# Patient Record
Sex: Female | Born: 1960 | Race: Black or African American | Hispanic: No | Marital: Married | Smoking: Never smoker
Health system: Southern US, Community
[De-identification: ages and names within clinical notes are randomized; demographics above are authoritative.]

## PROBLEM LIST (undated history)

## (undated) DIAGNOSIS — I2602 Saddle embolus of pulmonary artery with acute cor pulmonale: Secondary | ICD-10-CM

## (undated) DIAGNOSIS — K219 Gastro-esophageal reflux disease without esophagitis: Secondary | ICD-10-CM

## (undated) DIAGNOSIS — C569 Malignant neoplasm of unspecified ovary: Secondary | ICD-10-CM

## (undated) DIAGNOSIS — T451X5A Adverse effect of antineoplastic and immunosuppressive drugs, initial encounter: Secondary | ICD-10-CM

## (undated) DIAGNOSIS — I10 Essential (primary) hypertension: Secondary | ICD-10-CM

## (undated) DIAGNOSIS — D6481 Anemia due to antineoplastic chemotherapy: Secondary | ICD-10-CM

## (undated) HISTORY — PX: NASAL SINUS SURGERY: SHX719

## (undated) HISTORY — DX: Gastro-esophageal reflux disease without esophagitis: K21.9

## (undated) HISTORY — DX: Essential (primary) hypertension: I10

---

## 1998-01-27 HISTORY — PX: CHOLECYSTECTOMY: SHX55

## 1998-02-07 ENCOUNTER — Other Ambulatory Visit: Admission: RE | Admit: 1998-02-07 | Discharge: 1998-02-07 | Payer: Self-pay | Admitting: Obstetrics & Gynecology

## 1998-06-26 ENCOUNTER — Other Ambulatory Visit: Admission: RE | Admit: 1998-06-26 | Discharge: 1998-06-26 | Payer: Self-pay | Admitting: Obstetrics & Gynecology

## 1999-06-12 ENCOUNTER — Other Ambulatory Visit: Admission: RE | Admit: 1999-06-12 | Discharge: 1999-06-12 | Payer: Self-pay | Admitting: Obstetrics & Gynecology

## 2000-06-26 ENCOUNTER — Other Ambulatory Visit: Admission: RE | Admit: 2000-06-26 | Discharge: 2000-06-26 | Payer: Self-pay | Admitting: Obstetrics & Gynecology

## 2002-07-01 ENCOUNTER — Other Ambulatory Visit: Admission: RE | Admit: 2002-07-01 | Discharge: 2002-07-01 | Payer: Self-pay | Admitting: Obstetrics & Gynecology

## 2003-09-08 ENCOUNTER — Other Ambulatory Visit: Admission: RE | Admit: 2003-09-08 | Discharge: 2003-09-08 | Payer: Self-pay | Admitting: Obstetrics & Gynecology

## 2004-10-30 ENCOUNTER — Other Ambulatory Visit: Admission: RE | Admit: 2004-10-30 | Discharge: 2004-10-30 | Payer: Self-pay | Admitting: Obstetrics & Gynecology

## 2014-03-23 ENCOUNTER — Ambulatory Visit: Payer: 59 | Attending: Gynecologic Oncology | Admitting: Gynecologic Oncology

## 2014-03-23 ENCOUNTER — Encounter: Payer: Self-pay | Admitting: Gynecologic Oncology

## 2014-03-23 VITALS — BP 135/64 | HR 81 | Temp 98.5°F | Resp 20 | Ht 64.0 in | Wt 201.7 lb

## 2014-03-23 DIAGNOSIS — R971 Elevated cancer antigen 125 [CA 125]: Secondary | ICD-10-CM

## 2014-03-23 DIAGNOSIS — C569 Malignant neoplasm of unspecified ovary: Secondary | ICD-10-CM | POA: Insufficient documentation

## 2014-03-23 DIAGNOSIS — R1900 Intra-abdominal and pelvic swelling, mass and lump, unspecified site: Secondary | ICD-10-CM

## 2014-03-23 NOTE — Progress Notes (Signed)
Consult Note: Gyn-Onc  Audrey Porter 54 y.o. female  CC:  Chief Complaint  Patient presents with  . Ovarian Cancer    Left Pelvic mass    HPI: Patient is seen in consultation today at the request of Dr. Evie Lacks. Her primary care physician is Dr. Gar Ponto.  Patient is a 54 year old gravida 1 para 1 who thought that she was gaining weight for the past few months. She states that in January she had a urinary tract infection or so she thought. She started feeling some increase pressure and immediately after voiding felt like she still needed to void again and was having only small amounts of urine. She was seen by her primary care physician's office and she was diagnosed with a urinary tract infection and provided antibiotics; however, she was contacted that the urine culture was negative and her symptoms had not abated. She ultimately sought care for this. In retrospect she thinks that this is been developing an ongoing for several months.   She underwent a CT scan on 03/16/2014. It revealed the lung bases were clear. The liver enhances with no focal abnormality. There was perihepatic ascites present. The gallbladder was surgically absent. The pancreas, adrenal glands, spleen, stomach, kidneys were unremarkable. There was no evidence of any peritoneal nodularity or carcinomatosis. There is no lymphadenopathy. Within the pelvis there was a 9.9 x 20.1 x 17.8 large lobular mass which was septated and primarily cystic. However, the mass does contain some soft tissue component. The uterus could not be visualized. The urinary bladder was compressed by the large septated lobular mass.   Transvaginal ultrasound was performed on February 22. The uterus was 8.8 x 2.8 x 4.0 cm with no fibroids or other masses visualized. The endometrium was approximately 12 mm in thickness. The right ovary and left ovary were difficult to definitively leave visualize. Within the pelvis was a 20 x 19 x 9 cm solid and cystic  mass which filled the pelvic pelvis. There were large solid components with internal blood flow. There was a 1.5 cm bladder stone that was incidentally noted. CA-125 was performed that was 56.1. CEA was 0.6. All other labs were remarkable only for a creatinine of 1.44 and hematocrit of 32.7. Liver functions were normal. It is for this reason that she is referred to see [place] today.  Review of Systems: She denies any significant pain. She really only feels the pressure. She states that recently her husband commented that she doesn't seem to be eating as much so for the past several weeks she would endorse some early satiety but no nausea or vomiting. She has normal bowel and bladder habits. She's had no vaginal bleeding. She has any fevers, chills, nausea, vomiting. She denies any significant unintentional weight loss or weight gain. 10 point ROS is otherwise negative.  She is up-to-date on her mammograms having had one last year. She had a colonoscopy at age of 88 with benign polyps and follow-up in 5 years was recommended. Her last Pap smear was in 2014. It was normal she's never had an abnormal Pap smear.  Current Meds:  Outpatient Encounter Prescriptions as of 03/23/2014  Medication Sig  . fluticasone (FLONASE) 50 MCG/ACT nasal spray   . losartan-hydrochlorothiazide (HYZAAR) 100-12.5 MG per tablet Take 1 tablet by mouth daily. for high blood pressure  . nystatin-triamcinolone (MYCOLOG II) cream   . omeprazole (PRILOSEC) 20 MG capsule Take 20 mg by mouth 2 (two) times daily.  Marland Kitchen triamcinolone ointment (KENALOG) 0.1 %  Allergy:  Allergies  Allergen Reactions  . Sulfa Antibiotics Hives    Social Hx:  She has a daughter who is 97. 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 Marital Status: Married    Spouse Name: N/A  . Number of Children: N/A  . Years of Education: N/A   Occupational History  . Not on file.   Social History Main Topics  . Smoking status:  Never Smoker   . Smokeless tobacco: Not on file  . Alcohol Use: No  . Drug Use: No  . Sexual Activity: No   Other Topics Concern  . Not on file   Social History Narrative  . No narrative on file    Past Surgical Hx: History reviewed. No pertinent past surgical history.  Past Medical Hx: History reviewed. No pertinent past medical history.  Oncology Hx:   No history exists.    Family Hx: History reviewed. No pertinent family history. her brother died this past summer of lung cancer. He was a heavy smoker  Vitals:  Blood pressure 135/64, pulse 81, temperature 98.5 F (36.9 C), temperature source Oral, resp. rate 20, height 5\' 4"  (1.626 m), weight 201 lb 11.2 oz (91.491 kg).  Physical Exam: Well-nourished well-developed female in no acute distress.  Neck: Supple, no lymphadenopathy, no thyromegaly.  Lungs: Clear to auscultation bilaterally.  Cardiovascular: Regular rate and rhythm.  Abdomen: Obese with a large palpable mass that extends midway between the umbilicus to the xiphoid. There is no obvious fluid wave. There is no hepatosplenomegaly but exam is limited secondary to pelvic mass. She has mild tenderness. There is no rebound or guarding. The mass is not mobile and abdominal exam.  Groins: No lymphadenopathy.  Extremities: No edema.  Pelvic: Normal external female genitalia. The vagina is atraumatic and within normal limits. The cervix could not be seen. Bimanual examination the cervix is significantly deviated between the pubic symphysis. Palpably it is normal. There is a large mass that fills the abdomen and pelvis. There is no obvious nodularity. It is not mobile. Rectal confirms no nodularity.  Assessment/Plan:  54 year old with a large abdominal pelvic mass and elevated CA-125 that is concerning for an ovarian neoplasm. We discussed that definitive diagnosis can't be made until the time of surgery and she's willing to proceed. We discussed that we will need to  proceed with exploratory laparotomy and removal of the adnexal mass. She knows that it will be sent for frozen section. She would like to undergo a completion hysterectomy and removal of the contralateral adnexa. She understands if this is a malignancy she'll need to proceed with appropriate staging including lymph node dissection, biopsies, and omentectomy. [place] end of [place] therefore she'll undergo surgery at [facility] on March 4. She is a preoperative visit scheduled for February 29 at 9:00.  Her questions as well as those of her husband were elicited in answer to their satisfaction. If her daughter wishes to call me the patient does give permission for me to speak to her family members. Chiquitta Matty A., MD 03/23/2014, 1:03 PM

## 2014-03-23 NOTE — Patient Instructions (Signed)
Preop visit at [facility] on Monday, March 29 at 9:00. We will also coordinate a CT scan that same day. You will receive a call from Mrs. Wilson.  Surgery scheduled at [facility] on March 4.

## 2014-03-31 HISTORY — PX: ABDOMINAL HYSTERECTOMY: SHX81

## 2014-04-04 ENCOUNTER — Telehealth: Payer: Self-pay | Admitting: *Deleted

## 2014-04-04 NOTE — Telephone Encounter (Signed)
Pt called requesting appt for staple removal and f/u with Dr. Alycia Rossetti, pt c/o drive to [facility] is too far. Appt given for March 14 11am, and MD appt April 28 12pm,( previously 4/19 at [facility]) no further concerns,.

## 2014-04-10 ENCOUNTER — Ambulatory Visit: Payer: 59 | Attending: Gynecologic Oncology | Admitting: Gynecologic Oncology

## 2014-04-10 ENCOUNTER — Encounter: Payer: Self-pay | Admitting: Gynecologic Oncology

## 2014-04-10 VITALS — BP 124/66 | HR 80 | Temp 97.9°F | Resp 16 | Ht 64.0 in | Wt 195.5 lb

## 2014-04-10 DIAGNOSIS — Z79899 Other long term (current) drug therapy: Secondary | ICD-10-CM | POA: Insufficient documentation

## 2014-04-10 DIAGNOSIS — Z90710 Acquired absence of both cervix and uterus: Secondary | ICD-10-CM | POA: Diagnosis not present

## 2014-04-10 DIAGNOSIS — Z79891 Long term (current) use of opiate analgesic: Secondary | ICD-10-CM | POA: Diagnosis not present

## 2014-04-10 DIAGNOSIS — Z881 Allergy status to other antibiotic agents status: Secondary | ICD-10-CM | POA: Insufficient documentation

## 2014-04-10 DIAGNOSIS — R1900 Intra-abdominal and pelvic swelling, mass and lump, unspecified site: Secondary | ICD-10-CM

## 2014-04-10 DIAGNOSIS — I10 Essential (primary) hypertension: Secondary | ICD-10-CM | POA: Diagnosis not present

## 2014-04-10 DIAGNOSIS — K219 Gastro-esophageal reflux disease without esophagitis: Secondary | ICD-10-CM | POA: Insufficient documentation

## 2014-04-10 DIAGNOSIS — C569 Malignant neoplasm of unspecified ovary: Secondary | ICD-10-CM | POA: Insufficient documentation

## 2014-04-10 NOTE — Patient Instructions (Signed)
Plan to follow up on April 7.  You will receive a phone call about your final pathology.  Please call for any questions or concerns.

## 2014-04-11 ENCOUNTER — Encounter: Payer: Self-pay | Admitting: Gynecologic Oncology

## 2014-04-11 NOTE — Progress Notes (Signed)
Follow Up Note: Gyn-Onc  Audrey Porter 54 y.o. female  CC:  Chief Complaint  Patient presents with  . Routine Post Op    Follow up, staple removal    HPI:  Audrey Porter is a 54 year old, gravida 1 para 1, who noted weight gain over the past few months. In January, she developed symptoms of a urinary tract infection or so she thought. She started feeling some increase pressure and immediately after voiding felt like she still needed to void again and was having only small amounts of urine. She was seen by her primary care physician's office and she was diagnosed with a urinary tract infection and provided antibiotics; however, she was contacted that the urine culture was negative and her symptoms had not abated so she sought further evaluation.  She underwent a CT scan on 03/16/2014 that revealed the lung bases were clear. The liver enhances with no focal abnormality. There was perihepatic ascites present. The gallbladder was surgically absent. The pancreas, adrenal glands, spleen, stomach, kidneys were unremarkable. There was no evidence of any peritoneal nodularity or carcinomatosis. There is no lymphadenopathy. Within the pelvis there was a 9.9 x 20.1 x 17.8 large lobular mass which was septated and primarily cystic. However, the mass does contain some soft tissue component. The uterus could not be visualized. The urinary bladder was compressed by the large septated lobular mass.   Transvaginal ultrasound was performed on February 22. The uterus was 8.8 x 2.8 x 4.0 cm with no fibroids or other masses visualized. The endometrium was approximately 12 mm in thickness. The right ovary and left ovary were difficult to definitively leave visualize. Within the pelvis was a 20 x 19 x 9 cm solid and cystic mass which filled the pelvic pelvis. There were large solid components with internal blood flow. There was a 1.5 cm bladder stone that was incidentally noted. CA-125 was performed that was 56.1. CEA  was 0.6. All other labs were remarkable only for a creatinine of 1.44 and hematocrit of 32.7. Liver functions were normal.   On March 4, she underwent a total abdominal hysterectomy, bilateral salpingo-oophorectomy, bilateral pelvic and right peri-aortic lymphadenectomy, omentectomy, complete cytoreduction and staging of ovarian cancer. 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 Alycia Rossetti.  Final pathology pending at this time.  Operative findings included: Bilateral, complex and multicystic adnexal masses. The right ovarian mass was densely adherent to, but not invading through the sigmoid serosa. The right mass was also densely adherent to the posterior uterine wall. The posterior lower uterine segment was adherent to the rectosigmoid. There was no evidence of distant metastasis. There were reactive lymph nodes densely adherent to the external iliac vein on the right side. There was no omental or bowel involvement. 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 The cysts in the left ovary were drained in a controlled fashion. Some of the contents of the cyst in the right ovary spilled in the abdomen as it was being dissected off the rectosigmoid. 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     Interval History:  She presents with her husband for post-operative follow up and staple removal.  She reports doing well since surgery.  Diet tolerated and appetite approving since surgery.  Ambulating without difficulty with minimal incisional pain reported.  Denies nausea, emesis, or vaginal bleeding.  No issues voiced with her incision.  Taking milk of magnesia PRN and colace for her bowels.  Voiding  without difficulty.  Asking about her pathology and when she would need to start chemotherapy.  See more detailed review of systems below.  No other concerns voiced.    Review of Systems  Constitutional: Feels well.  No fever, chills,  early satiety, unintentional weight loss or gain.  Cardiovascular: No chest pain, shortness of breath, or edema.  Pulmonary: No cough or wheeze.  Gastrointestinal: No nausea, vomiting, or diarrhea. No bright red blood per rectum or change in bowel movement.  Genitourinary: No frequency, urgency, or dysuria. No vaginal bleeding or discharge.  Musculoskeletal: No myalgia or joint pain. Neurologic: No weakness, numbness, or change in gait.  Psychology: No depression, anxiety, or insomnia.  Current Meds:  Outpatient Encounter Prescriptions as of 04/10/2014  Medication Sig  . cetirizine (ZYRTEC) 5 MG tablet Take 5 mg by mouth daily.  Marland Kitchen docusate sodium (COLACE) 100 MG capsule Take 100 mg by mouth 2 (two) times daily.  Marland Kitchen enoxaparin (LOVENOX) 40 MG/0.4ML injection Inject 40 mg into the skin daily.   . fluticasone (FLONASE) 50 MCG/ACT nasal spray Place 1 spray into both nostrils daily.   Marland Kitchen losartan-hydrochlorothiazide (HYZAAR) 100-12.5 MG per tablet Take 1 tablet by mouth daily. for high blood pressure  . Multiple Vitamins-Minerals (CENTRUM SILVER ULTRA WOMENS PO) Take 1 tablet by mouth daily.  Marland Kitchen nystatin-triamcinolone (MYCOLOG II) cream Apply 1 application topically daily. Per pt, applies under both breasts after showering  . omeprazole (PRILOSEC) 20 MG capsule Take 20 mg by mouth 2 (two) times daily.  Marland Kitchen oxyCODONE (OXY IR/ROXICODONE) 5 MG immediate release tablet Take 5 mg by mouth every 4 (four) hours as needed.  . [DISCONTINUED] triamcinolone ointment (KENALOG) 0.1 %     Allergy:  Allergies  Allergen Reactions  . Sulfa Antibiotics Hives    Social Hx:   History   Social History  . Marital Status: Married    Spouse Name: N/A  . Number of Children: N/A  . Years of Education: N/A   Occupational History  . Not on file.   Social History Main Topics  . Smoking status: Never Smoker   . Smokeless tobacco: Not on file  . Alcohol Use: No  . Drug Use: No  . Sexual Activity: No   Other  Topics Concern  . Not on file   Social History Narrative    Past Surgical Hx:  Past Surgical History  Procedure Laterality Date  . Abdominal hysterectomy  03/31/14    at [facility]  . Cholecystectomy  2000  . Cesarean section  1990    Past Medical Hx:  Past Medical History  Diagnosis Date  . Hypertension   . Acid reflux     Family Hx: History reviewed. No pertinent family history.  Vitals:  Blood pressure 124/66, pulse 80, temperature 97.9 F (36.6 C), temperature source Oral, resp. rate 16, height 5\' 4"  (1.626 m), weight 195 lb 8 oz (88.678 kg).  Physical Exam:  General: Well developed, well nourished female in no acute distress. Alert and oriented x 3.  Cardiovascular: Regular rate and rhythm. S1 and S2 normal.  Lungs: Clear to auscultation bilaterally. No wheezes/crackles/rhonchi noted.  Skin: No rashes or lesions present. Back: No CVA tenderness.  Abdomen: Abdomen soft, non-tender and obese. Active bowel sounds in all quadrants. 26 staples removed from the midline incision.  1/2 inch steri strips applied.  No erythema, drainage, or evidence of wound separation present.  Extremities: No bilateral cyanosis, edema, or clubbing.   Assessment/Plan:  54 year old s/ptotal  abdominal hysterectomy, bilateral salpingo-oophorectomy, bilateral pelvic and right peri-aortic lymphadenectomy, omentectomy, complete cytoreduction and staging of ovarian cancer. 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 Alycia Rossetti.  Final pathology pending at this time.  She is doing well post-operatively.  She is informed that she would be contacted from Dr. Alycia Rossetti or [facility] with her pathology and recommendations.  Follow up is scheduled for April 7 in [place].  Incisional care reinforced along with reportable signs and symptoms.  Advised to call for any questions or concerns.     Audrey Crigler DEAL, NP 04/11/2014, 3:46 PM

## 2014-04-19 ENCOUNTER — Other Ambulatory Visit: Payer: Self-pay | Admitting: Gynecologic Oncology

## 2014-04-19 DIAGNOSIS — C569 Malignant neoplasm of unspecified ovary: Secondary | ICD-10-CM

## 2014-04-24 ENCOUNTER — Telehealth: Payer: Self-pay | Admitting: Oncology

## 2014-04-24 NOTE — Telephone Encounter (Signed)
Pt aware of appt's  Chemo ed-05/02/14@9 :38 Dr. Livesay-05/05/14@9 :30

## 2014-04-26 ENCOUNTER — Other Ambulatory Visit: Payer: Self-pay | Admitting: Oncology

## 2014-04-26 DIAGNOSIS — C561 Malignant neoplasm of right ovary: Secondary | ICD-10-CM | POA: Insufficient documentation

## 2014-04-26 DIAGNOSIS — C563 Malignant neoplasm of bilateral ovaries: Secondary | ICD-10-CM

## 2014-04-26 DIAGNOSIS — C562 Malignant neoplasm of left ovary: Principal | ICD-10-CM

## 2014-05-02 ENCOUNTER — Encounter: Payer: Self-pay | Admitting: *Deleted

## 2014-05-02 ENCOUNTER — Other Ambulatory Visit: Payer: 59

## 2014-05-04 ENCOUNTER — Ambulatory Visit: Payer: 59 | Attending: Gynecologic Oncology | Admitting: Gynecologic Oncology

## 2014-05-04 ENCOUNTER — Encounter: Payer: Self-pay | Admitting: Gynecologic Oncology

## 2014-05-04 VITALS — BP 119/68 | HR 70 | Temp 98.4°F | Resp 18 | Ht 64.0 in | Wt 192.5 lb

## 2014-05-04 DIAGNOSIS — I10 Essential (primary) hypertension: Secondary | ICD-10-CM | POA: Diagnosis not present

## 2014-05-04 DIAGNOSIS — Z9049 Acquired absence of other specified parts of digestive tract: Secondary | ICD-10-CM | POA: Diagnosis not present

## 2014-05-04 DIAGNOSIS — C561 Malignant neoplasm of right ovary: Secondary | ICD-10-CM | POA: Diagnosis not present

## 2014-05-04 DIAGNOSIS — C562 Malignant neoplasm of left ovary: Secondary | ICD-10-CM | POA: Insufficient documentation

## 2014-05-04 DIAGNOSIS — K219 Gastro-esophageal reflux disease without esophagitis: Secondary | ICD-10-CM | POA: Insufficient documentation

## 2014-05-04 DIAGNOSIS — R1900 Intra-abdominal and pelvic swelling, mass and lump, unspecified site: Secondary | ICD-10-CM

## 2014-05-04 DIAGNOSIS — Z90710 Acquired absence of both cervix and uterus: Secondary | ICD-10-CM | POA: Insufficient documentation

## 2014-05-04 DIAGNOSIS — C563 Malignant neoplasm of bilateral ovaries: Secondary | ICD-10-CM

## 2014-05-04 NOTE — Progress Notes (Signed)
Consult Note: Gyn-Onc  Audrey Porter 54 y.o. female  CC:  Chief Complaint  Patient presents with  . Routine Post Op    HPI: Patient is seen in consultation today at the request of Dr. Evie Lacks. Her primary care physician is Dr. Gar Ponto.  Patient is a 54 year old gravida 1 para 1 who thought that she was gaining weight for the past few months. She states that in January she had a urinary tract infection or so she thought. She started feeling some increase pressure and immediately after voiding felt like she still needed to void again and was having only small amounts of urine. She was seen by her primary care physician's office and she was diagnosed with a urinary tract infection and provided antibiotics; however, she was contacted that the urine culture was negative and her symptoms had not abated. She ultimately sought care for this. In retrospect she thinks that this is been developing an ongoing for several months.   She underwent a CT scan on 03/16/2014. It revealed the lung bases were clear. The liver enhances with no focal abnormality. There was perihepatic ascites present. The gallbladder was surgically absent. The pancreas, adrenal glands, spleen, stomach, kidneys were unremarkable. There was no evidence of any peritoneal nodularity or carcinomatosis. There is no lymphadenopathy. Within the pelvis there was a 9.9 x 20.1 x 17.8 large lobular mass which was septated and primarily cystic. However, the mass does contain some soft tissue component. The uterus could not be visualized. The urinary bladder was compressed by the large septated lobular mass.   Transvaginal ultrasound was performed on February 22. The uterus was 8.8 x 2.8 x 4.0 cm with no fibroids or other masses visualized. The endometrium was approximately 12 mm in thickness. The right ovary and left ovary were difficult to definitively leave visualize. Within the pelvis was a 20 x 19 x 9 cm solid and cystic mass which filled  the pelvic pelvis. There were large solid components with internal blood flow. There was a 1.5 cm bladder stone that was incidentally noted. CA-125 was performed that was 56.1. CEA was 0.6. All other labs were remarkable only for a creatinine of 1.44 and hematocrit of 32.7. Liver functions were normal. It is for this reason that she is referred to see [place] today.   Oncology Summary:    Ovarian cancer (RAF-HCC)    02/2014  Initial Diagnosis  Presented with weight gain, pelvic pressure, urinary frequency. CT: 20cm septate, cystic mass, ascites. CA-125 56.1.    03/31/2014  Surgery  TAH/BSO, BPLND, RPALND, omentectomy, ureterolysis. Findings: Bilat complex ovarian masses, dense adhesions, intraop spill of R cyst contents. R0 resection. Path: clear cell CA involving both ovaries, 1 R pelvic LN shows microscopic (6m) met.     Pathology: Diagnosis: FSA, A: Ovary and fallopian tube, left, left salpingo-oophorectomy  Histologic type: Clear cell carcinoma (see comment)   Histologic grade: high grade/grade 3   Tumor site: (ovary/primary peritoneal) ovary   Tumor size: (greatest dimension) 13 x 9.5 x 6.2 cm   Ovarian surface involvement: not identified   Status of capsule: (intact/ruptured) intact   Extent of involvement of other tissues/organs (select all that apply):   Right ovary  Left ovary  Lymphovascular space invasion: present   Regional lymph nodes (see other specimens):  Total number involved: 1  Total number examined: 10 Size of largest metastasis: microscopic focus only, approximately 1 mm;  necrosis, fibrosis, and foreign body type giant cell reaction present within two  additional lymph nodes, changes suggestive of treatment effect or regression   Additional pathologic findings: none   AJCC Pathologic Stage: pT3a1(i) pN1 pMx FIGO (2014classification) Stage Grouping: IIIA1(i)  Note: This pathologic stage assessment is based on information available at the time of this  report, and is subject to change pending clinical review and additional information.  B: Ovary and fallopian tube, right, right salpingo-oophorectomy  Histologic type: clear cell carcinoma (B8)  Histologic grade: high grade/grade 3   Tumor site: (ovary/primary peritoneal) ovary   Tumor size: (greatest dimension) 11 x 9 x 4.5 cm   Ovarian surface involvement: not identified   Status of capsule: (intact/ruptured) ruptured   C: Uterus and cervix, hysterectomy  Cervix  - No tumor seen   Endometrium  - Atrophic endometrium   Myometrium  - Adenomyosis and subserosal endometriosis   D: Omentum, omentectomy - No tumor seen   E: Lymph nodes, right periaortic, regional dissection - Three lymph nodes, no tumor seen (0/3)   F: Lymph nodes, right pelvic, regional dissection - 1 out of 6 lymph nodes shows minute (approximately 2 mm) focus of metastatic adenocarcinoma (1/6) - Two additional lymph nodes show necrosis, fibrosis, and foreign body type giant cell reaction suggestive of treatment effect versus regression   G: Lymph node, left pelvic, regional dissection - One lymph node, no tumor seen (0/1)   COMMENT: Multiple immunostains are performed on block A12 to further evaluate the case for metastasis to the bilateral ovaries form a distant site and to evaluate for mucinous versus clear cell change. The tumor shows focal intracytoplasmic mucin on mucin stains, and immunostains shows the tumor to be cytokeratin 7 positive, cytokeratin 20 negative, CDX2 negative, PAX8 positive, and negative for chromogranin, synaptophysin, TTF1, p53, ER, and PR, and rare positive cells on CA125, with low p53 (approximately 5%). Napsin is positive. RCC and CD10 are negative. PAS is positive and the positive staining does not digest with PASD. These findings supporting a tumor of primary gynecologic origin. Overall, a clear cell carcinoma is favored. Seromucinous features cannot be excluded  based on the presence of scattered, faint intracytoplasmic mucin noted on mucin stain and the morphology with focal signet ring cells. Although uncommon, this has been described, however, in clear cell carcinomas, therefore the above classification is favored.     She comes in today for her postoperative visit. She's overall doing quite well. She offers no complaints. She denies any vaginal bleeding or discharge. She has minimal pain. She has normal bowel and bladder function.  Current Meds:  Outpatient Encounter Prescriptions as of 05/04/2014  Medication Sig  . cetirizine (ZYRTEC) 5 MG tablet Take 5 mg by mouth daily.  . fluticasone (FLONASE) 50 MCG/ACT nasal spray Place 1 spray into both nostrils daily.   Marland Kitchen losartan-hydrochlorothiazide (HYZAAR) 100-12.5 MG per tablet Take 1 tablet by mouth daily. for high blood pressure  . Multiple Vitamins-Minerals (CENTRUM SILVER ULTRA WOMENS PO) Take 1 tablet by mouth daily.  Marland Kitchen nystatin-triamcinolone (MYCOLOG II) cream Apply 1 application topically daily. Per pt, applies under both breasts after showering  . omeprazole (PRILOSEC) 20 MG capsule Take 20 mg by mouth 2 (two) times daily.  Marland Kitchen oxyCODONE (OXY IR/ROXICODONE) 5 MG immediate release tablet Take 5 mg by mouth every 4 (four) hours as needed.  . docusate sodium (COLACE) 100 MG capsule Take 100 mg by mouth 2 (two) times daily.  . [DISCONTINUED] enoxaparin (LOVENOX) 40 MG/0.4ML injection Inject 40 mg into the skin daily.  Allergy:  Allergies  Allergen Reactions  . Sulfa Antibiotics Hives    Social Hx:  She has a daughter who is 44. 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 Marital Status: Married    Spouse Name: N/A  . Number of Children: N/A  . Years of Education: N/A   Occupational History  . Not on file.   Social History Main Topics  . Smoking status: Never Smoker   . Smokeless tobacco: Not on file  . Alcohol Use: No  . Drug Use: No  . Sexual  Activity: No   Other Topics Concern  . Not on file   Social History Narrative    Past Surgical Hx:  Past Surgical History  Procedure Laterality Date  . Abdominal hysterectomy  03/31/14    at [facility]  . Cholecystectomy  2000  . Cesarean section  1990    Past Medical Hx:  Past Medical History  Diagnosis Date  . Hypertension   . Acid reflux     Oncology Hx:    Ovarian cancer, bilateral   03/23/2014 Initial Diagnosis Pelvic mass   03/31/2014 Surgery IIIA1(i) clear cell carcinoma of the ovary    Family Hx: History reviewed. No pertinent family history. her brother died this past summer of lung cancer. He was a heavy smoker  Vitals:  Blood pressure 119/68, pulse 70, temperature 98.4 F (36.9 C), temperature source Oral, resp. rate 18, height '5\' 4"'  (1.626 m), weight 192 lb 8 oz (87.317 kg).  Physical Exam: Well-nourished well-developed female in no acute distress.  Abdomen: Well-healed vertical midline incision. Abdomen is soft and nontender. There is no appreciable hernias.  Pelvic: Normal female genitalia. The vaginal cuff is well-healed. Bimanual examination reveals postoperative changes but no masses or nodularity.  Stage/Disposition: Audrey Porter is a 54 y.o. 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 Disposition to platinum and taxane chemotherapy and referral to genetic counseling. A referral has in place for genetic counseling. The patient is scheduled to see Dr. Marko Plume tomorrow for discussion regarding chemotherapy. After she gets her first cycle of chemotherapy, she'll call the office to see when she feels like she'll be able to return to work. She is overdue on her mammogram and we placed an order for her to have one performed in [place] which is closer to home. We've also place an appointment for her to be seen by the genetics counselors.  She was provided a copy of her pathology report and we reviewed in detail. Her questions were elicited in  answer to her satisfaction. Koi Yarbro A., MD 05/04/2014, 1:50 PM

## 2014-05-05 ENCOUNTER — Ambulatory Visit: Payer: 59

## 2014-05-05 ENCOUNTER — Telehealth: Payer: Self-pay | Admitting: Oncology

## 2014-05-05 ENCOUNTER — Other Ambulatory Visit (HOSPITAL_COMMUNITY): Payer: Self-pay | Admitting: Unknown Physician Specialty

## 2014-05-05 ENCOUNTER — Encounter: Payer: Self-pay | Admitting: Oncology

## 2014-05-05 ENCOUNTER — Other Ambulatory Visit: Payer: Self-pay | Admitting: Gynecologic Oncology

## 2014-05-05 ENCOUNTER — Other Ambulatory Visit (HOSPITAL_BASED_OUTPATIENT_CLINIC_OR_DEPARTMENT_OTHER): Payer: 59

## 2014-05-05 ENCOUNTER — Telehealth: Payer: Self-pay | Admitting: Genetic Counselor

## 2014-05-05 ENCOUNTER — Ambulatory Visit (HOSPITAL_BASED_OUTPATIENT_CLINIC_OR_DEPARTMENT_OTHER): Payer: 59 | Admitting: Oncology

## 2014-05-05 VITALS — BP 132/62 | HR 70 | Temp 97.7°F | Resp 18 | Ht 64.0 in | Wt 192.1 lb

## 2014-05-05 DIAGNOSIS — J3089 Other allergic rhinitis: Secondary | ICD-10-CM

## 2014-05-05 DIAGNOSIS — C563 Malignant neoplasm of bilateral ovaries: Secondary | ICD-10-CM

## 2014-05-05 DIAGNOSIS — C561 Malignant neoplasm of right ovary: Secondary | ICD-10-CM

## 2014-05-05 DIAGNOSIS — Z1231 Encounter for screening mammogram for malignant neoplasm of breast: Secondary | ICD-10-CM

## 2014-05-05 DIAGNOSIS — Z9049 Acquired absence of other specified parts of digestive tract: Secondary | ICD-10-CM

## 2014-05-05 DIAGNOSIS — N135 Crossing vessel and stricture of ureter without hydronephrosis: Secondary | ICD-10-CM

## 2014-05-05 DIAGNOSIS — C562 Malignant neoplasm of left ovary: Secondary | ICD-10-CM | POA: Diagnosis not present

## 2014-05-05 DIAGNOSIS — K219 Gastro-esophageal reflux disease without esophagitis: Secondary | ICD-10-CM

## 2014-05-05 DIAGNOSIS — I10 Essential (primary) hypertension: Secondary | ICD-10-CM

## 2014-05-05 LAB — COMPREHENSIVE METABOLIC PANEL (CC13)
ALT: 16 U/L (ref 0–55)
ANION GAP: 12 meq/L — AB (ref 3–11)
AST: 13 U/L (ref 5–34)
Albumin: 4 g/dL (ref 3.5–5.0)
Alkaline Phosphatase: 56 U/L (ref 40–150)
BILIRUBIN TOTAL: 0.53 mg/dL (ref 0.20–1.20)
BUN: 16.8 mg/dL (ref 7.0–26.0)
CO2: 23 mEq/L (ref 22–29)
CREATININE: 1.4 mg/dL — AB (ref 0.6–1.1)
Calcium: 9.5 mg/dL (ref 8.4–10.4)
Chloride: 105 mEq/L (ref 98–109)
EGFR: 51 mL/min/{1.73_m2} — AB (ref >90)
GLUCOSE: 94 mg/dL (ref 70–140)
Potassium: 3.7 mEq/L (ref 3.5–5.1)
Sodium: 140 mEq/L (ref 136–145)
Total Protein: 7.3 g/dL (ref 6.4–8.3)

## 2014-05-05 LAB — CBC WITH DIFFERENTIAL/PLATELET
BASO%: 1.3 % (ref 0.0–2.0)
Basophils Absolute: 0.1 10*3/uL (ref 0.0–0.1)
EOS ABS: 0.4 10*3/uL (ref 0.0–0.5)
EOS%: 6.9 % (ref 0.0–7.0)
HCT: 33 % — ABNORMAL LOW (ref 34.8–46.6)
HGB: 10.8 g/dL — ABNORMAL LOW (ref 11.6–15.9)
LYMPH#: 2.2 10*3/uL (ref 0.9–3.3)
LYMPH%: 38.6 % (ref 14.0–49.7)
MCH: 27 pg (ref 25.1–34.0)
MCHC: 32.5 g/dL (ref 31.5–36.0)
MCV: 82.9 fL (ref 79.5–101.0)
MONO#: 0.5 10*3/uL (ref 0.1–0.9)
MONO%: 8 % (ref 0.0–14.0)
NEUT%: 45.2 % (ref 38.4–76.8)
NEUTROS ABS: 2.6 10*3/uL (ref 1.5–6.5)
Platelets: 299 10*3/uL (ref 145–400)
RBC: 3.99 10*6/uL (ref 3.70–5.45)
RDW: 15.6 % — AB (ref 11.2–14.5)
WBC: 5.7 10*3/uL (ref 3.9–10.3)

## 2014-05-05 MED ORDER — ONDANSETRON HCL 8 MG PO TABS: 8.0000 mg | ORAL_TABLET | Freq: Three times a day (TID) | ORAL | Status: DC | PRN

## 2014-05-05 MED ORDER — DEXAMETHASONE 4 MG PO TABS: ORAL_TABLET | ORAL | Status: DC

## 2014-05-05 MED ORDER — LORAZEPAM 0.5 MG PO TABS
ORAL_TABLET | ORAL | Status: DC
Start: 2014-05-05 — End: 2015-01-08

## 2014-05-05 NOTE — Progress Notes (Signed)
[place] NEW PATIENT EVALUATION   Name: Audrey Porter Date: May 05, 2014  MRN: [id number] DOB: 1960-08-19  REFERRING PHYSICIAN: Nancy Marus cc Caryl Bis, MD (PCP), Alden Hipp), [facility])   REASON FOR REFERRAL: clear cell ovarian carcinoma, for adjuvant chemotherapy  Information from outside records from [facility] reviewed in detail by this MD, direct communication from Dr Alycia Rossetti, this EMR and history from patient.   HISTORY OF PRESENT ILLNESS:Audrey Porter is a 54 y.o. female who is seen in consultation, together with husband, at the request of  Dr Alycia Rossetti, with recently diagnosed  clear cell carcinoma of bilateral ovaries, IIIA1(i)  Patient had been in usual good health, up to date on all medical care, when she developed bladder symptoms in early 2016. Urine culture showed no infection, then CT AP at [facility] 03-16-14 found perihepatic ascites and pelvic mass 9.9 x 20.1 x 17.8 cm which was cystic and solid, kidneys and ureters unremarkable, no pelvic fluid, rectosigmoid partially compressed, other organs not remarkable, no mention of adenopathy (report scanned into this EMR). Past gyn care has been by Dr Alden Hipp, however she was seen promptly by Dr Evie Lacks on referral from Dr Quillian Quince after CT resulted; CT findings confirmed by transvaginal US. CA 125 was 56 pre op. She saw Dr Alycia Rossetti in consultation on 03-23-14, then had surgery at [facility] on 03-31-14 which was TAH/BSO, omentectomy, pelvic and paraaortic node evaluation. Surgical findings were of bilateral complex ovarian masses and dense adhesions, with intra-operative spill of right ovarian mass as that was densely adherent to sigmoid serosa; posterior uterine segment also adherent to rectosigmoid, and retroperitoneal fibrosis bilaterally. Pathology [facility] [phone]) found grade 3 clear cell carcinoma involving both ovaries, largest 13 x 9.5 x 6.2 cm, + LVSI and one right pelvic node with microscopic  involvement approximately 1 mm, ER PR negative, pT3a1 pN1 pMx. Cytology from ascites [facility] [phone]) was positive for adenocarcinoma of gyn primary.  Multidisciplinary conference at [facility] 04-19-14 recommended carboplatin and taxane chemotherapy. She saw Dr Alycia Rossetti on 05-04-14, recovering well post operatively. She attended chemotherapy education class prior to this visit.    No PAC Genetics counseling requested by Dr Alycia Rossetti    REVIEW OF SYSTEMS as above, also: Constipation initially after surgery, used colace, now bowels moving regularly with soft stool. No dysuria or other symptoms of bladder infection. No significant hot flashes. Appetite at baseline, healthy diet tho does not eat large portions. Weight at usual. No fever. No bleeding, no symptoms of clots; not on lovenox. No significant pain now from surgery. No HA, no peripheral neuropathy. Wears glasses. Environmental allergies improved with zyrtec and flonase. No problems with hearing. Up to date on dental care every 6 months. No thyroid problems. No lower respiratory symptoms. No changes in breasts. 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 Peripheral venous access thought adequate to begin chemotherapy. Remainder of full 10 point review of systems negative.   ALLERGIES: Sulfa antibiotics causes rash  PAST MEDICAL/ SURGICAL HISTORY:    All previous PAPs fine, last prior 2014 G1P1 with C section 1990 HTN x 10 years Laparoscopy with fulguration of endometriosis by Dr Alden Hipp. Menses stopped after procedure stayed on BCP until ~ age 14, no significant hot flashes Cholecystectomy 2000 Dr Anthony Sar in [place] previously at Dr Lanette Hampshire office, due ~ now, ordered at [facility] by Dr Alycia Rossetti GERD improved with prilosec daily Hgb 11 preop, no history of anemia known  CURRENT MEDICATIONS: reviewed as  listed now in EMR. Prescriptions for decadron 20 mg with food 12 hrs and 6 hrs prior to taxol, prn zofran and prn ativan. Written and oral  instructions given for these medications  PHARMACY [facility]  SOCIAL HISTORY: from [place] where she lives with husband. 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 Husband works in [place]:   HTN Brother died of lung ca, was heavy smoker No other cancer Daughter healthy         PHYSICAL EXAM:  height is '5\' 4"'  (1.626 m) and weight is 192 lb 1.6 oz (87.136 kg). Her oral temperature is 97.7 F (36.5 C). Her blood pressure is 132/62 and her pulse is 70. Her respiration is 18 and oxygen saturation is 100%.  Alert, pleasant, cooperative lady looks stated age, easily mobile, excellent historian, very articulate, looks comfortable. Husband very supportive. HEENT:normal hair pattern. PERRL, not icteric. Oral mucosa moist and clear, good dentition. Neck supple without JVD or thyroid mass  RESPIRATORY:respirations not labored RA. Lungs clear to A and P  CARDIAC/ VASCULAR: Heart RRR without murmur or gallop. Peripheral pulses intact and symmetrical.   ABDOMEN: soft, not tender, not distended. Midline incision healing well, without tenderness, erythema or drainage. No HSM or mass. Normally active BS  LYMPH NODES:no cervical, supraclavicular, axillary or inguinal adenopathy  BREASTS:bilaterally without dominant mass, skin or nipple findings  NEUROLOGIC: CN, motor, sensory, cerebellar intact. Fully alert and oriented. PSYCH appropriate mood and affect  SKIN: without rash, ecchymosis, petechiae  MUSCULOSKELETAL: good and symmetrical muscle mass, back nontender, LE without swelling, cords, tenderness   LABORATORY DATA:  Results for orders placed or performed in visit on 05/05/14 (from the past 48  hour(s))  CBC with Differential     Status: Abnormal   Collection Time: 05/05/14  9:09 AM  Result Value Ref Range   WBC 5.7 3.9 - 10.3 10e3/uL   NEUT# 2.6 1.5 - 6.5 10e3/uL   HGB 10.8 (L) 11.6 - 15.9 g/dL   HCT 33.0 (L) 34.8 - 46.6 %   Platelets 299 145 - 400 10e3/uL   MCV 82.9 79.5 - 101.0 fL   MCH 27.0 25.1 - 34.0 pg   MCHC 32.5 31.5 - 36.0 g/dL   RBC 3.99 3.70 - 5.45 10e6/uL   RDW 15.6 (H) 11.2 - 14.5 %   lymph# 2.2 0.9 - 3.3 10e3/uL   MONO# 0.5 0.1 - 0.9 10e3/uL   Eosinophils Absolute 0.4 0.0 - 0.5 10e3/uL   Basophils Absolute 0.1 0.0 - 0.1 10e3/uL   NEUT% 45.2 38.4 - 76.8 %   LYMPH% 38.6 14.0 - 49.7 %   MONO% 8.0 0.0 - 14.0 %   EOS% 6.9 0.0 - 7.0 %   BASO% 1.3 0.0 - 2.0 %  Comprehensive metabolic panel (Cmet) - [facility]     Status: Abnormal   Collection Time: 05/05/14  9:09 AM  Result Value Ref Range   Sodium 140 136 - 145 mEq/L   Potassium 3.7 3.5 - 5.1 mEq/L   Chloride 105 98 - 109 mEq/L   CO2 23 22 - 29 mEq/L   Glucose 94 70 - 140 mg/dl   BUN 16.8 7.0 - 26.0 mg/dL   Creatinine 1.4 (H) 0.6 - 1.1 mg/dL   Total  Bilirubin 0.53 0.20 - 1.20 mg/dL   Alkaline Phosphatase 56 40 - 150 U/L   AST 13 5 - 34 U/L   ALT 16 0 - 55 U/L   Total Protein 7.3 6.4 - 8.3 g/dL   Albumin 4.0 3.5 - 5.0 g/dL   Calcium 9.5 8.4 - 10.4 mg/dL   Anion Gap 12 (H) 3 - 11 mEq/L   EGFR 51 (L) >90 ml/min/1.73 m2    Comment: eGFR is calculated using the CKD-EPI Creatinine Equation (2009)      PATHOLOGY:  Result Report  Surgical pathology exam3/04/2014  [place]       Result Narrative  Patient:     BOLUWATIFE, MUTCHLER MRN:         [id number] DOB (Age):   01-15-1961 (Age: 30) Collected:   03/31/2014 Received:    03/31/2014 Completed:   04/14/2014    Accession #:   [id number]  Diagnosis:  FSA, A:  Ovary and fallopian tube, left, left salpingo-oophorectomy  Histologic type:     Clear cell carcinoma (see comment)        Histologic grade:     high grade/grade 3   Tumor site:  (ovary/primary peritoneal)     ovary   Tumor size: (greatest dimension)     13 x 9.5 x 6.2 cm   Ovarian surface involvement:     not identified   Status of capsule: (intact/ruptured)     intact   Extent of involvement of other tissues/organs (select all that apply):                 Right ovary            Left ovary  Lymphovascular space invasion:     present   Regional lymph nodes (see other specimens):           Total number involved:     1           Total number examined:     10      Size of largest metastasis:     microscopic focus only, approximately 1 mm;       necrosis, fibrosis, and foreign body type giant cell reaction present within two additional lymph nodes, changes suggestive of treatment effect or regression        Additional pathologic findings:     none   AJCC Pathologic Stage:                pT3a1(i) pN1 pMx FIGO (2014classification) Stage Grouping:     IIIA1(i)  Note:  This pathologic stage assessment is based on information available at the time of this report, and is subject to change pending clinical review and additional information.  B:  Ovary and fallopian tube, right, right salpingo-oophorectomy  Histologic type:     clear cell carcinoma (B8)  Histologic grade:     high grade/grade 3   Tumor site: (ovary/primary peritoneal)     ovary   Tumor size: (greatest dimension)     11 x 9 x 4.5 cm   Ovarian surface involvement:     not identified   Status of capsule: (intact/ruptured)     ruptured   C:  Uterus and cervix, hysterectomy  Cervix  - No tumor seen   Endometrium  - Atrophic endometrium   Myometrium  - Adenomyosis and subserosal endometriosis   D:  Omentum, omentectomy - No tumor seen   E:  Lymph nodes, right periaortic, regional dissection -  Three lymph nodes, no tumor seen (0/3)   F:  Lymph nodes, right pelvic, regional dissection - 1 out of 6 lymph nodes shows minute (approximately 2 mm) focus of metastatic adenocarcinoma  (1/6) - Two additional lymph nodes show necrosis, fibrosis, and foreign body type giant cell reaction suggestive of treatment effect versus regression   G:  Lymph node, left pelvic, regional dissection - One lymph node, no tumor seen (0/1)   COMMENT: Multiple immunostains are performed on block A12 to further evaluate the case for metastasis to the bilateral ovaries form a distant site and to evaluate for mucinous versus clear cell change. The tumor shows focal intracytoplasmic mucin on mucin stains, and immunostains shows the tumor to be cytokeratin 7 positive, cytokeratin 20 negative, CDX2 negative, PAX8 positive, and negative for chromogranin, synaptophysin, TTF1, p53, ER, and PR, and rare positive cells on CA125, with low p53 (approximately 5%). Napsin is positive. RCC and CD10 are negative. PAS is positive and the positive staining does not digest with PASD. These findings supporting a tumor of primary gynecologic origin. Overall, a clear cell carcinoma is favored. Seromucinous features cannot be excluded based on the presence of scattered, faint intracytoplasmic mucin noted on mucin stain and the morphology with focal signet ring cells. Although uncommon, this has been described, however, in clear cell carcinomas, therefore the above classification is favored       CYTOLOGY [place] - Pelvic   Result Narrative  Patient:     DENIKA, KRONE MRN:         [id number] DOB (Age):   September 06, 1960 (Age: 59) Collected:   03/31/2014 Received:    04/03/2014 Completed:   04/04/2014   MIM#[id number]     [facility] 03-31-2014 Non-GYN Cytopathology Report Accession #:   [id number]  DIAGNOSIS: A.  Ascitic fluid:   - Positive for malignancy - Adenocarcinoma of gynecologic/primary peritoneal origin (See comment)       RADIOGRAPHY: See report of CT AP from [facility] 03-16-14, scanned into this EMR  CXR at [facility] 03-27-14 not remarkable, that report in [place].  DISCUSSION: Circumstances  surrounding diagnosis, procedures to date and surgical findings reviewed. Rationale for adjuvant chemotherapy for clear cell ovarian cancer discussed. She and husband are comfortable with information received from chemotherapy education class. Reviewed q 3 week schedule for taxol and carboplatin, which she prefers to weekly dose dense. Discussed peripheral venous administration vs PAC. Discussed possible side effects of chemotherapy including nausea, drop in counts, hair loss around time of second treatment, taxol aches, premedication steroids to lessen chance of reaction to taxol, antiemetics, possible peripheral neuropathy from taxol. We have discussed follow up visits between chemotherapy treatments, including follow up blood counts and chemistries. We have discussed CA 125 marker. They understand that they can call at any time if questions or concerns. Husband does not work on Fridays, so that day best for treatment. She is presently on leave from work, may need this extended or at least intermittent FMLA during chemotherapy; note work involves visits to group homes.    IMPRESSION / PLAN:  1. High grade clear cell carcinoma of bilateral ovaries with microscopic involvement of one right pelvic node at complete debulking surgery by Dr Alycia Rossetti at [facility] 03-31-2014, FIGO IIIA1 (i). Plan to begin chemotherapy with every 3 week carboplatin and taxol x 6 cycles starting 05-12-14. I will see her with labs ~4-22 and 05-25-14. Genetics counseling 2.Retroperitoneal fibrosis and dense adhesions at surgery 3.HTN well controlled 4.due mammograms upcoming, which are to be  scheduled at [facility] 5.previous fulguration of endometriosis, post C section 6.GERD controlled with prilosec 7.rash to sulfa 8.environmental allergies on zyrtec and flonase 9.Advance Directive information given   Patient and husband have had questions answered to their satisfaction and are in agreement with plan above. They can contact this office  for questions or concerns at any time prior to next scheduled visit. Verbal consent for chemo obtained. Chemo orders entered. Message to financial staff re preauthorization for chemo and possible granix. Message to RN to follow up by phone re premedication steroids shortly prior to first treatment. Time spent 55 min , including >50% discussion and coordination of care.  Cc this note Drs Jana Half, MD 05/05/2014 11:32 AM

## 2014-05-05 NOTE — Telephone Encounter (Signed)
appointments made and avs printed for patient °

## 2014-05-05 NOTE — Progress Notes (Signed)
Checked in new pt with no financial concerns at this time.  Pt has my card for any billing questions or concerns. °

## 2014-05-05 NOTE — Patient Instructions (Signed)
We will send prescriptions to your pharmacy   1.decadron (dexamethasone, steroid) 4 mg. Take five tablets +(=20 mg) with food 12 hrs before taxol chemotherapy and five tablets with food 6 hrs before taxol. Chemo will be at 9:15 AM on 05-12-14, so you will take decadron at ~ 9 PM on Thurs 4-14 and again ~ 3 AM on Fri 4-15.  2.zofran (ondansetron) 8mg  One tablet every 8 hrs as needed for nausea. Will not make you drowsy. Fine to take one tablet AM after chemo whether or not any nausea then, to extend coverage for nausea a bit longer. Other than that dose, fine to take just as needed for nausea   3.ativan (lorazepam) 0.5 mg. One tablet swallow or dissolve under tongue every 6 hrs as needed for nausea. WIll make you drowsy and a little forgetful around each dose. Fine to take one tablet at bedtime night of chemo whether or not any nausea.   You can call any time if needed [phone]

## 2014-05-05 NOTE — Telephone Encounter (Signed)
GENETIC APPT-S/W PATIENT AND GAVE GENETIC APPT FOR 04/14 @ 12:30 W/GENETIC COUNSELOR REFERRING DR. Nancy Marus

## 2014-05-07 ENCOUNTER — Other Ambulatory Visit: Payer: Self-pay | Admitting: Oncology

## 2014-05-07 DIAGNOSIS — N135 Crossing vessel and stricture of ureter without hydronephrosis: Secondary | ICD-10-CM | POA: Insufficient documentation

## 2014-05-07 DIAGNOSIS — Z9049 Acquired absence of other specified parts of digestive tract: Secondary | ICD-10-CM | POA: Insufficient documentation

## 2014-05-07 DIAGNOSIS — I10 Essential (primary) hypertension: Secondary | ICD-10-CM | POA: Insufficient documentation

## 2014-05-07 DIAGNOSIS — K219 Gastro-esophageal reflux disease without esophagitis: Secondary | ICD-10-CM | POA: Insufficient documentation

## 2014-05-07 DIAGNOSIS — Z9109 Other allergy status, other than to drugs and biological substances: Secondary | ICD-10-CM | POA: Insufficient documentation

## 2014-05-08 ENCOUNTER — Telehealth: Payer: Self-pay | Admitting: Oncology

## 2014-05-08 NOTE — Telephone Encounter (Signed)
Appointments made for 4/22 and patient will get a new schedule at her 4/18 appointments

## 2014-05-08 NOTE — Telephone Encounter (Signed)
Patient confirmed labs canceled for 04/15 due to having labs the with Genetics.

## 2014-05-09 ENCOUNTER — Ambulatory Visit: Payer: Self-pay

## 2014-05-09 ENCOUNTER — Telehealth: Payer: Self-pay

## 2014-05-09 NOTE — Telephone Encounter (Signed)
Spoke with Ms. Ericsson about  Labs for her treatment  being drawn on 05-10-13 instead of 05-12-14 as noted below by Dr. Marko Plume. Reviewed the times to take the decadron prior to 05-12-14 treatment. 2100 on 05-11-14 and 0300 on 05-12-14 with some food.  Told her to take an ativan tab the night of chemo and a zofran tablet in teh am after treatment whether or not she was nauseous to keep medication in her system.  She then uses the antiemetics prn.  Reviewed how the Taxol aches can become very severe 2-3 days after her treatment.  She can use a heating pad, take warm baths , calritin 10 mg daily for 5 days beginning 05-12-14.  Sdvil or tylenol prn. She does not have any stronger pain medication on hand.  She can call ithe office if the pain is not controled with the interventions mentioned.  Patient verbalized understanding.

## 2014-05-09 NOTE — Telephone Encounter (Signed)
-----   Message from Gordy Levan, MD sent at 05/07/2014 10:48 AM EDT ----- #2 message on this lady  I see she is now scheduled for genetics on 4-14 with their labs that day. I sent urgent POF to move chemo labs from 4-15 day of Rx to draw them with genetics labs on 4-14.  Could you please explain this to patient and whoever else asks about it Thanks

## 2014-05-09 NOTE — Telephone Encounter (Signed)
Patient Demographics     Patient Name Sex DOB SSN Address Phone    Audrey Porter, Audrey Porter Female 06-22-1960 [id number] [place] [address] [phone] [facility]) [phone] (Work) [phone] (Mobile)      Message  Received: 4 days ago    Gordy Levan, MD  Baruch Merl, RN; Christa See, RN           RN please speak with her by phone shortly before first long taxol carbo on 05-12-14, to review steroids, antiemetics, taxol aches.    Please send to pharmacy [facility] always fine   1.zofran 8 mg: 1 q 8 hr prn nausea. Will not make drowsy #30 1RF   2.ativan 0.5 mg: 1 SL or po q 6 hr prn nausea. Will make drowsy #20 NRF   3.decadron 4 mg: Five tabs with food (=20mg ) 12 hrs prior to chemo and 5 tabs with  food (=20 mg) 6 hrs prior to chemo  #10 for first cycle   thanks

## 2014-05-10 ENCOUNTER — Ambulatory Visit: Payer: Self-pay

## 2014-05-11 ENCOUNTER — Ambulatory Visit (HOSPITAL_BASED_OUTPATIENT_CLINIC_OR_DEPARTMENT_OTHER): Payer: 59 | Admitting: Genetic Counselor

## 2014-05-11 ENCOUNTER — Other Ambulatory Visit (HOSPITAL_BASED_OUTPATIENT_CLINIC_OR_DEPARTMENT_OTHER): Payer: 59

## 2014-05-11 ENCOUNTER — Ambulatory Visit: Payer: Self-pay

## 2014-05-11 DIAGNOSIS — C561 Malignant neoplasm of right ovary: Secondary | ICD-10-CM

## 2014-05-11 DIAGNOSIS — Z803 Family history of malignant neoplasm of breast: Secondary | ICD-10-CM

## 2014-05-11 DIAGNOSIS — Z800 Family history of malignant neoplasm of digestive organs: Secondary | ICD-10-CM

## 2014-05-11 DIAGNOSIS — C562 Malignant neoplasm of left ovary: Principal | ICD-10-CM

## 2014-05-11 DIAGNOSIS — C563 Malignant neoplasm of bilateral ovaries: Secondary | ICD-10-CM

## 2014-05-11 LAB — CBC WITH DIFFERENTIAL/PLATELET
BASO%: 1.2 % (ref 0.0–2.0)
BASOS ABS: 0.1 10*3/uL (ref 0.0–0.1)
EOS%: 5.1 % (ref 0.0–7.0)
Eosinophils Absolute: 0.3 10*3/uL (ref 0.0–0.5)
HEMATOCRIT: 32.5 % — AB (ref 34.8–46.6)
HGB: 10.5 g/dL — ABNORMAL LOW (ref 11.6–15.9)
LYMPH#: 2.2 10*3/uL (ref 0.9–3.3)
LYMPH%: 35 % (ref 14.0–49.7)
MCH: 26.5 pg (ref 25.1–34.0)
MCHC: 32.3 g/dL (ref 31.5–36.0)
MCV: 82 fL (ref 79.5–101.0)
MONO#: 0.5 10*3/uL (ref 0.1–0.9)
MONO%: 7.2 % (ref 0.0–14.0)
NEUT%: 51.5 % (ref 38.4–76.8)
NEUTROS ABS: 3.2 10*3/uL (ref 1.5–6.5)
Platelets: 376 10*3/uL (ref 145–400)
RBC: 3.96 10*6/uL (ref 3.70–5.45)
RDW: 15.5 % — AB (ref 11.2–14.5)
WBC: 6.3 10*3/uL (ref 3.9–10.3)

## 2014-05-11 LAB — COMPREHENSIVE METABOLIC PANEL (CC13)
ALBUMIN: 4 g/dL (ref 3.5–5.0)
ALT: 14 U/L (ref 0–55)
ANION GAP: 9 meq/L (ref 3–11)
AST: 13 U/L (ref 5–34)
Alkaline Phosphatase: 59 U/L (ref 40–150)
BUN: 17 mg/dL (ref 7.0–26.0)
CALCIUM: 9.4 mg/dL (ref 8.4–10.4)
CO2: 26 meq/L (ref 22–29)
Chloride: 104 mEq/L (ref 98–109)
Creatinine: 1.3 mg/dL — ABNORMAL HIGH (ref 0.6–1.1)
EGFR: 54 mL/min/{1.73_m2} — ABNORMAL LOW (ref >90)
Glucose: 91 mg/dl (ref 70–140)
POTASSIUM: 4.3 meq/L (ref 3.5–5.1)
SODIUM: 140 meq/L (ref 136–145)
TOTAL PROTEIN: 7.1 g/dL (ref 6.4–8.3)
Total Bilirubin: 0.48 mg/dL (ref 0.20–1.20)

## 2014-05-12 ENCOUNTER — Ambulatory Visit (HOSPITAL_BASED_OUTPATIENT_CLINIC_OR_DEPARTMENT_OTHER): Payer: 59

## 2014-05-12 ENCOUNTER — Other Ambulatory Visit: Payer: Self-pay

## 2014-05-12 ENCOUNTER — Other Ambulatory Visit: Payer: Self-pay | Admitting: Oncology

## 2014-05-12 ENCOUNTER — Encounter: Payer: Self-pay | Admitting: Nurse Practitioner

## 2014-05-12 ENCOUNTER — Ambulatory Visit (HOSPITAL_BASED_OUTPATIENT_CLINIC_OR_DEPARTMENT_OTHER): Payer: 59 | Admitting: Nurse Practitioner

## 2014-05-12 ENCOUNTER — Encounter: Payer: Self-pay | Admitting: Genetic Counselor

## 2014-05-12 VITALS — BP 115/68 | HR 71 | Temp 98.1°F | Resp 18

## 2014-05-12 DIAGNOSIS — C561 Malignant neoplasm of right ovary: Secondary | ICD-10-CM | POA: Diagnosis not present

## 2014-05-12 DIAGNOSIS — C563 Malignant neoplasm of bilateral ovaries: Secondary | ICD-10-CM

## 2014-05-12 DIAGNOSIS — C562 Malignant neoplasm of left ovary: Secondary | ICD-10-CM

## 2014-05-12 DIAGNOSIS — Z803 Family history of malignant neoplasm of breast: Secondary | ICD-10-CM | POA: Insufficient documentation

## 2014-05-12 DIAGNOSIS — Z5111 Encounter for antineoplastic chemotherapy: Secondary | ICD-10-CM

## 2014-05-12 DIAGNOSIS — T7840XA Allergy, unspecified, initial encounter: Secondary | ICD-10-CM | POA: Insufficient documentation

## 2014-05-12 DIAGNOSIS — Z800 Family history of malignant neoplasm of digestive organs: Secondary | ICD-10-CM | POA: Insufficient documentation

## 2014-05-12 LAB — CA 125: CA 125: 8 U/mL (ref <35)

## 2014-05-12 MED ORDER — SODIUM CHLORIDE 0.9 % IV SOLN
Freq: Once | INTRAVENOUS | Status: AC
  Administered 2014-05-12: 10:00:00 via INTRAVENOUS

## 2014-05-12 MED ORDER — SODIUM CHLORIDE 0.9 % IV SOLN
Freq: Once | INTRAVENOUS | Status: AC | PRN
  Administered 2014-05-12: 11:00:00 via INTRAVENOUS

## 2014-05-12 MED ORDER — METHYLPREDNISOLONE SODIUM SUCC 125 MG IJ SOLR
125.0000 mg | Freq: Once | INTRAMUSCULAR | Status: AC | PRN
  Administered 2014-05-12: 125 mg via INTRAVENOUS

## 2014-05-12 MED ORDER — FAMOTIDINE IN NACL 20-0.9 MG/50ML-% IV SOLN
INTRAVENOUS | Status: AC
  Filled 2014-05-12: qty 50

## 2014-05-12 MED ORDER — FAMOTIDINE IN NACL 20-0.9 MG/50ML-% IV SOLN
20.0000 mg | Freq: Once | INTRAVENOUS | Status: AC
  Administered 2014-05-12: 20 mg via INTRAVENOUS

## 2014-05-12 MED ORDER — SODIUM CHLORIDE 0.9 % IV SOLN
Freq: Once | INTRAVENOUS | Status: AC
  Administered 2014-05-12: 10:00:00 via INTRAVENOUS
  Filled 2014-05-12: qty 8

## 2014-05-12 MED ORDER — DIPHENHYDRAMINE HCL 50 MG/ML IJ SOLN
25.0000 mg | Freq: Once | INTRAMUSCULAR | Status: AC | PRN
  Administered 2014-05-12: 25 mg via INTRAVENOUS

## 2014-05-12 MED ORDER — DIPHENHYDRAMINE HCL 50 MG/ML IJ SOLN
50.0000 mg | Freq: Once | INTRAMUSCULAR | Status: AC
  Administered 2014-05-12: 50 mg via INTRAVENOUS

## 2014-05-12 MED ORDER — DIPHENHYDRAMINE HCL 50 MG/ML IJ SOLN
INTRAMUSCULAR | Status: AC
  Filled 2014-05-12: qty 1

## 2014-05-12 MED ORDER — SODIUM CHLORIDE 0.9 % IV SOLN
558.0000 mg | Freq: Once | INTRAVENOUS | Status: AC
  Administered 2014-05-12: 560 mg via INTRAVENOUS
  Filled 2014-05-12: qty 56

## 2014-05-12 MED ORDER — DEXTROSE 5 % IV SOLN
175.0000 mg/m2 | Freq: Once | INTRAVENOUS | Status: AC
  Administered 2014-05-12: 348 mg via INTRAVENOUS
  Filled 2014-05-12: qty 58

## 2014-05-12 NOTE — Progress Notes (Signed)
SYMPTOM MANAGEMENT CLINIC   HPI: Audrey Porter 54 y.o. female diagnosed with ovarian cancer.  Patient is status post hysterectomy.  Here today to initiate her first cycle of carboplatin/Taxol chemotherapy regimen.  Patient recently diagnosed with ovarian cancer.  She is status post hysterectomy obtained on 03/31/2014.  Patient presented to the [facility] today to receive her first cycle of carboplatin/Taxol chemotherapy regimen.  Patient developed some mild chest heaviness for a brief amount of time at the onset of the Taxol infusion.  Patient denied any actual chest pain, shortness of breath, or pain with inspiration.  Confirmed patient was premedicated with Zofran 16 mg, dexamethasone 20 mg, Benadryl 50 mg, and Pepcid 20 mg.  Taxol infusion was immediately held; and patient was given Benadryl and Solu-Medrol 125 mg IV.  All symptoms did eventually resolve completely; and patient was able to complete her chemotherapy as previously directed.  All vital signs remained stable throughout.  HPI  ROS  Past Medical History  Diagnosis Date  . Hypertension   . Acid reflux     Past Surgical History  Procedure Laterality Date  . Abdominal hysterectomy  03/31/14    at [facility]  . Cholecystectomy  2000  . Cesarean section  1990    has Ovarian cancer, bilateral; Environmental and seasonal allergies; Gastroesophageal reflux disease without esophagitis; HTN (hypertension), benign; Retroperitoneal fibrosis; Status post cholecystectomy; Family history of malignant neoplasm of breast; Family history of malignant neoplasm of gastrointestinal tract; and Hypersensitivity reaction on her problem list.    is allergic to sulfa antibiotics.    Medication List       This list is accurate as of: 05/12/14  4:52 PM.  Always use your most recent med list.               CENTRUM SILVER ULTRA WOMENS PO  Take 1 tablet by mouth daily.     cetirizine 5 MG tablet  Commonly known as:  ZYRTEC  Take 5  mg by mouth daily.     dexamethasone 4 MG tablet  Commonly known as:  DECADRON  Take 5 tabs with food (=20 mg) 12 hours nad 6 hrs prior to Taxol Chemotherapy     docusate sodium 100 MG capsule  Commonly known as:  COLACE  Take 100 mg by mouth 2 (two) times daily.     fluticasone 50 MCG/ACT nasal spray  Commonly known as:  FLONASE  Place 1 spray into both nostrils daily.     LORazepam 0.5 MG tablet  Commonly known as:  ATIVAN  Place 1 tablet under the tongue or swallow every 6 hours as needed for nausea or vomiting.  Will make drowsy.     losartan-hydrochlorothiazide 100-12.5 MG per tablet  Commonly known as:  HYZAAR  Take 1 tablet by mouth daily. for high blood pressure     nystatin-triamcinolone cream  Commonly known as:  MYCOLOG II  Apply 1 application topically daily. Per pt, applies under both breasts after showering     omeprazole 20 MG capsule  Commonly known as:  PRILOSEC  Take 20 mg by mouth 2 (two) times daily.     ondansetron 8 MG tablet  Commonly known as:  ZOFRAN  Take 1 tablet (8 mg total) by mouth every 8 (eight) hours as needed for nausea or vomiting. Will not make drowsy     oxyCODONE 5 MG immediate release tablet  Commonly known as:  Oxy IR/ROXICODONE  Take 5 mg by mouth every 4 (four)  hours as needed.         PHYSICAL EXAMINATION  Oncology Vitals 05/12/2014 05/12/2014 05/12/2014 05/12/2014 05/12/2014 05/12/2014 05/12/2014  Height - - - - - - -  Weight - - - - - - -  Weight (lbs) - - - - - - -  BMI (kg/m2) - - - - - - -  Temp 98.1 98.8 97.8 97.7 97.5 97.5 97.5  Pulse 71 63 65 69 70 73 76  Resp '18 16 16 16 16 18 18  ' SpO2 - 100 100 100 100 100 100  BSA (m2) - - - - - - -   BP Readings from Last 3 Encounters:  05/12/14 115/68  05/05/14 132/62  05/04/14 119/68    Physical Exam  Constitutional: She is oriented to person, place, and time and well-developed, well-nourished, and in no distress.  HENT:  Head: Normocephalic and atraumatic.    Mouth/Throat: Oropharynx is clear and moist.  Eyes: Conjunctivae and EOM are normal. Pupils are equal, round, and reactive to light. Right eye exhibits no discharge. Left eye exhibits no discharge. No scleral icterus.  Neck: Normal range of motion. Neck supple. No JVD present. No tracheal deviation present. No thyromegaly present.  Cardiovascular: Normal rate, regular rhythm, normal heart sounds and intact distal pulses.   Pulmonary/Chest: Effort normal and breath sounds normal. No stridor. No respiratory distress. She has no wheezes. She has no rales. She exhibits no tenderness.  Abdominal: Soft. Bowel sounds are normal. She exhibits no distension and no mass. There is no tenderness. There is no rebound and no guarding.  Musculoskeletal: Normal range of motion. She exhibits no edema or tenderness.  Lymphadenopathy:    She has no cervical adenopathy.  Neurological: She is alert and oriented to person, place, and time. Gait normal.  Skin: Skin is warm and dry. No rash noted. No erythema. No pallor.  Psychiatric: Affect normal.  Nursing note and vitals reviewed.   LABORATORY DATA:. Appointment on 05/11/2014  Component Date Value Ref Range Status  . WBC 05/11/2014 6.3  3.9 - 10.3 10e3/uL Final  . NEUT# 05/11/2014 3.2  1.5 - 6.5 10e3/uL Final  . HGB 05/11/2014 10.5* 11.6 - 15.9 g/dL Final  . HCT 05/11/2014 32.5* 34.8 - 46.6 % Final  . Platelets 05/11/2014 376  145 - 400 10e3/uL Final  . MCV 05/11/2014 82.0  79.5 - 101.0 fL Final  . MCH 05/11/2014 26.5  25.1 - 34.0 pg Final  . MCHC 05/11/2014 32.3  31.5 - 36.0 g/dL Final  . RBC 05/11/2014 3.96  3.70 - 5.45 10e6/uL Final  . RDW 05/11/2014 15.5* 11.2 - 14.5 % Final  . lymph# 05/11/2014 2.2  0.9 - 3.3 10e3/uL Final  . MONO# 05/11/2014 0.5  0.1 - 0.9 10e3/uL Final  . Eosinophils Absolute 05/11/2014 0.3  0.0 - 0.5 10e3/uL Final  . Basophils Absolute 05/11/2014 0.1  0.0 - 0.1 10e3/uL Final  . NEUT% 05/11/2014 51.5  38.4 - 76.8 % Final  .  LYMPH% 05/11/2014 35.0  14.0 - 49.7 % Final  . MONO% 05/11/2014 7.2  0.0 - 14.0 % Final  . EOS% 05/11/2014 5.1  0.0 - 7.0 % Final  . BASO% 05/11/2014 1.2  0.0 - 2.0 % Final  . Sodium 05/11/2014 140  136 - 145 mEq/L Final  . Potassium 05/11/2014 4.3  3.5 - 5.1 mEq/L Final  . Chloride 05/11/2014 104  98 - 109 mEq/L Final  . CO2 05/11/2014 26  22 - 29 mEq/L Final  .  Glucose 05/11/2014 91  70 - 140 mg/dl Final  . BUN 05/11/2014 17.0  7.0 - 26.0 mg/dL Final  . Creatinine 05/11/2014 1.3* 0.6 - 1.1 mg/dL Final  . Total Bilirubin 05/11/2014 0.48  0.20 - 1.20 mg/dL Final  . Alkaline Phosphatase 05/11/2014 59  40 - 150 U/L Final  . AST 05/11/2014 13  5 - 34 U/L Final  . ALT 05/11/2014 14  0 - 55 U/L Final  . Total Protein 05/11/2014 7.1  6.4 - 8.3 g/dL Final  . Albumin 05/11/2014 4.0  3.5 - 5.0 g/dL Final  . Calcium 05/11/2014 9.4  8.4 - 10.4 mg/dL Final  . Anion Gap 05/11/2014 9  3 - 11 mEq/L Final  . EGFR 05/11/2014 54* >90 ml/min/1.73 m2 Final   eGFR is calculated using the CKD-EPI Creatinine Equation (2009)  . CA 125 05/11/2014 8  <35 U/mL Final   Comment:  This test was performed using the [place] chemiluminescentmethod.  Values obtained from different assay methods cannot be usedinterchangeably.  CA125 levels , regardless of value, should not beinterpreted as absolute evidence of the presence or  absence ofdisease.      RADIOGRAPHIC STUDIES: No results found.  ASSESSMENT/PLAN:    Ovarian cancer, bilateral Patient recently diagnosed with ovarian cancer.  She is status post hysterectomy obtained on 03/31/2014.  Patient presented to the [facility] today to receive her first cycle of carboplatin/Taxol chemotherapy regimen.  Patient developed some mild chest heaviness for a brief amount of time at the onset of the Taxol infusion.  Patient denied any actual chest pain, shortness of breath, or pain with inspiration.  Confirmed patient was premedicated with Zofran 16 mg,  dexamethasone 20 mg, Benadryl 50 mg, and Pepcid 20 mg.  Taxol infusion was immediately held; and patient was given Benadryl and Solu-Medrol 125 mg IV.  All symptoms did eventually resolve completely; and patient was able to complete her chemotherapy as previously directed.  All vital signs remained stable throughout.  Patient has plans to return on 05/19/2014 for labs and a follow-up visit.  Her next chemotherapy will be due around 06/02/2014.   Hypersensitivity reaction Patient recently diagnosed with ovarian cancer.  She is status post hysterectomy obtained on 03/31/2014.  Patient presented to the [facility] today to receive her first cycle of carboplatin/Taxol chemotherapy regimen.  Patient developed some mild chest heaviness for a brief amount of time at the onset of the Taxol infusion.  Patient denied any actual chest pain, shortness of breath, or pain with inspiration.  Confirmed patient was premedicated with Zofran 16 mg, dexamethasone 20 mg, Benadryl 50 mg, and Pepcid 20 mg.  Taxol infusion was immediately held; and patient was given Benadryl and Solu-Medrol 125 mg IV.  All symptoms did eventually resolve completely; and patient was able to complete her chemotherapy as previously directed.  All vital signs remained stable throughout.    Patient stated understanding of all instructions; and was in agreement with this plan of care. The patient knows to call the clinic with any problems, questions or concerns.   Review/collaboration with Dr. Marko Plume regarding all aspects of patient's visit today.   Total time spent with patient was 40 minutes;  with greater than 75 percent of that time spent in face to face counseling regarding patient's symptoms,  and coordination of care and follow up.  Disclaimer: This note was dictated with voice recognition software. Similar sounding words can inadvertently be transcribed and may not be corrected upon review.   Berniece Salines,  Caren Griffins, NP 05/12/2014

## 2014-05-12 NOTE — Assessment & Plan Note (Signed)
Patient recently diagnosed with ovarian cancer.  She is status post hysterectomy obtained on 03/31/2014.  Patient presented to the [facility] today to receive her first cycle of carboplatin/Taxol chemotherapy regimen.  Patient developed some mild chest heaviness for a brief amount of time at the onset of the Taxol infusion.  Patient denied any actual chest pain, shortness of breath, or pain with inspiration.  Confirmed patient was premedicated with Zofran 16 mg, dexamethasone 20 mg, Benadryl 50 mg, and Pepcid 20 mg.  Taxol infusion was immediately held; and patient was given Benadryl and Solu-Medrol 125 mg IV.  All symptoms did eventually resolve completely; and patient was able to complete her chemotherapy as previously directed.  All vital signs remained stable throughout.

## 2014-05-12 NOTE — Progress Notes (Signed)
[facility] Patient Visit  REFERRING PROVIDER: Caryl Bis, MD [place], [place] [address]  PRIMARY PROVIDER:  Gar Ponto, MD  PRIMARY REASON FOR VISIT:  1. Ovarian cancer, bilateral   2. Family history of malignant neoplasm of breast   3. Family history of malignant neoplasm of gastrointestinal tract     HISTORY OF PRESENT ILLNESS:   Audrey Porter, a 54 y.o. female, was seen for a [facility] cancer genetics consultation at the request of Dr. Quillian Quince due to a personal and family history of cancer.  Audrey Porter presents to clinic today to discuss the possibility of a hereditary predisposition to cancer, genetic testing, and to further clarify her future cancer risks, as well as potential cancer risks for family members.   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    Past Medical History  Diagnosis Date   Hypertension    Acid reflux     Past Surgical History  Procedure Laterality Date   Abdominal hysterectomy  03/31/14    at [facility]   Cholecystectomy  2000   Cesarean section  1990    History   Social History   Marital Status: Married    Spouse Name: N/A   Number of Children: N/A   Years of Education: N/A   Social History Main Topics   Smoking status: Never Smoker    Smokeless tobacco: Not on file   Alcohol Use: No   Drug Use: No   Sexual Activity: No   Other Topics Concern   Not on file   Social History Narrative     FAMILY HISTORY:  During the visit, a 4-generation pedigree was obtained. A copy of the pedigree with be scanned into Epic under the Media tab. Significant family history diagnoses include the following: Family History  Problem Relation Age of Onset   Cancer Brother 52    lung smoker   Cancer Paternal Aunt 20    breast   Cancer Maternal Grandmother 25    leukemia    Cancer Paternal Grandmother     colorectal at unknown age   Audrey Porter ancestry is of African American descent. There is no known Jewish ancestry or consanguinity.  GENETIC COUNSELING ASSESSMENT:  Audrey Porter is a 54 y.o. female with a personal and family history of cancer suggestive of a hereditary predisposition to cancer. We, therefore, discussed and recommended the following at today's visit.   DISCUSSION:  We reviewed the characteristics, features and inheritance patterns of hereditary cancer syndromes. We also discussed genetic testing, including the appropriate family members to test, the process of testing, insurance coverage and turn-around-time for results. We discussed the implications of a negative, positive and/or variant of uncertain significant result. We recommended Audrey Porter pursue genetic testing for the OvaNext gene panel. The OvaNext gene panel offered by [employer] includes sequencing and rearrangement analysis for the following 24 genes:ATM, BARD1, BRCA1, BRCA2, BRIP1, CDH1, CHEK2, EPCAM, MLH1, MRE11A, MSH2, MSH6, MUTYH, NBN, NF1, PALB2, PMS2, PTEN, RAD50, RAD51C, RAD51D, SMARCA4, STK11, and TP53.  PLAN:  Based on our above recommendation, Audrey Porter wished to pursue genetic testing and the blood sample was drawn and will be sent to [facility] for analysis. Results should be available within approximately 4 weeks time, at which point they will be disclosed by telephone to Audrey Porter, as  will any additional recommendations warranted by these results. Lastly, we encouraged Audrey Porter to remain in contact with cancer genetics annually so that we can continuously update the family history and inform her of any changes in cancer genetics and testing that may be of benefit for this family.   Ms.  Dekay questions were answered to her satisfaction today. Our contact information was provided should additional questions or concerns arise. Thank you for the  referral and allowing [place] to share in the care of your patient.   Catherine A. Fine, MS, CGC Certified Psychologist, [email or web address] phone: [phone]  The patient was seen for a total of 45 minutes in face-to-face genetic counseling.  This patient was discussed with Dr. Marko Plume who agrees with the above.    ______________________________________________________________________ For Office Staff:  Number of people involved in session including genetic counselor: 2 Was an intern or student involved with case: not applicable

## 2014-05-12 NOTE — Assessment & Plan Note (Addendum)
Patient recently diagnosed with ovarian cancer.  She is status post hysterectomy obtained on 03/31/2014.  Patient presented to the [facility] today to receive her first cycle of carboplatin/Taxol chemotherapy regimen.  Patient developed some mild chest heaviness for a brief amount of time at the onset of the Taxol infusion.  Patient denied any actual chest pain, shortness of breath, or pain with inspiration.  Confirmed patient was premedicated with Zofran 16 mg, dexamethasone 20 mg, Benadryl 50 mg, and Pepcid 20 mg.  Taxol infusion was immediately held; and patient was given Benadryl and Solu-Medrol 125 mg IV.  All symptoms did eventually resolve completely; and patient was able to complete her chemotherapy as previously directed.  All vital signs remained stable throughout.  Patient has plans to return on 05/19/2014 for labs and a follow-up visit.  Her next chemotherapy will be due around 06/02/2014.

## 2014-05-12 NOTE — Patient Instructions (Signed)
[facility] Discharge Instructions for Patients Receiving Chemotherapy  Today you received the following chemotherapy agents Taxol /Carboplatin.  To help prevent nausea and vomiting after your treatment, we encourage you to take your nausea medication as needed   If you develop nausea and vomiting that is not controlled by your nausea medication, call the clinic.   BELOW ARE SYMPTOMS THAT SHOULD BE REPORTED IMMEDIATELY:  *FEVER GREATER THAN 100.5 F  *CHILLS WITH OR WITHOUT FEVER  NAUSEA AND VOMITING THAT IS NOT CONTROLLED WITH YOUR NAUSEA MEDICATION  *UNUSUAL SHORTNESS OF BREATH  *UNUSUAL BRUISING OR BLEEDING  TENDERNESS IN MOUTH AND THROAT WITH OR WITHOUT PRESENCE OF ULCERS  *URINARY PROBLEMS  *BOWEL PROBLEMS  UNUSUAL RASH Items with * indicate a potential emergency and should be followed up as soon as possible.  Feel free to call the clinic you have any questions or concerns. The clinic phone number is [phone].  Please show the CHEMO ALERT CARD at check-in to the Emergency Department and triage nurse.   

## 2014-05-12 NOTE — Progress Notes (Signed)
1040 taxol started. 1041 pt states she is having trouble getting her breath, heavy chest. Taxol stopped, NS infusing, 02 sat 92%, Benadryl 25mg  IV administered, 02 2 liters applied.  1042 O2 sat 95% 1044 Solumedrol 125 mg IV given, pt states she is feeling better, VSS 02 sat 100% 1125 Taxol restarted. Pt tolerating well. Rate increased per protocol.

## 2014-05-15 ENCOUNTER — Telehealth: Payer: Self-pay

## 2014-05-15 NOTE — Telephone Encounter (Signed)
Audrey Porter stated that she is doing well. She is feeling tired.  Experienced some legs aches and took  some ibuprofen with good effect. She is taking Claritin for aches.  She also uses zyrtec.  Told her not to use the zyrtec while taking the Claritin. She is eating and drinking fluids well. Bowels are moving well She knows to call [phone] if she has any concerns or issues prior to her appointment 05-19-14.

## 2014-05-17 ENCOUNTER — Telehealth: Payer: Self-pay | Admitting: Oncology

## 2014-05-17 ENCOUNTER — Other Ambulatory Visit: Payer: Self-pay | Admitting: Oncology

## 2014-05-17 DIAGNOSIS — C563 Malignant neoplasm of bilateral ovaries: Secondary | ICD-10-CM

## 2014-05-17 DIAGNOSIS — C561 Malignant neoplasm of right ovary: Secondary | ICD-10-CM

## 2014-05-17 DIAGNOSIS — C562 Malignant neoplasm of left ovary: Principal | ICD-10-CM

## 2014-05-17 NOTE — Telephone Encounter (Signed)
Appointments made per pof and per pof patient will get a new schedule at her 4/22

## 2014-05-19 ENCOUNTER — Ambulatory Visit (HOSPITAL_BASED_OUTPATIENT_CLINIC_OR_DEPARTMENT_OTHER): Payer: 59 | Admitting: Oncology

## 2014-05-19 ENCOUNTER — Encounter: Payer: Self-pay | Admitting: Oncology

## 2014-05-19 ENCOUNTER — Other Ambulatory Visit (HOSPITAL_BASED_OUTPATIENT_CLINIC_OR_DEPARTMENT_OTHER): Payer: 59

## 2014-05-19 ENCOUNTER — Telehealth: Payer: Self-pay | Admitting: Oncology

## 2014-05-19 VITALS — BP 104/66 | HR 96 | Temp 97.8°F | Resp 18 | Ht 64.0 in | Wt 189.9 lb

## 2014-05-19 DIAGNOSIS — C563 Malignant neoplasm of bilateral ovaries: Secondary | ICD-10-CM

## 2014-05-19 DIAGNOSIS — T451X5A Adverse effect of antineoplastic and immunosuppressive drugs, initial encounter: Secondary | ICD-10-CM

## 2014-05-19 DIAGNOSIS — J3089 Other allergic rhinitis: Secondary | ICD-10-CM

## 2014-05-19 DIAGNOSIS — C562 Malignant neoplasm of left ovary: Secondary | ICD-10-CM

## 2014-05-19 DIAGNOSIS — E860 Dehydration: Secondary | ICD-10-CM

## 2014-05-19 DIAGNOSIS — C561 Malignant neoplasm of right ovary: Secondary | ICD-10-CM | POA: Diagnosis not present

## 2014-05-19 DIAGNOSIS — D701 Agranulocytosis secondary to cancer chemotherapy: Secondary | ICD-10-CM

## 2014-05-19 DIAGNOSIS — D72819 Decreased white blood cell count, unspecified: Secondary | ICD-10-CM

## 2014-05-19 LAB — COMPREHENSIVE METABOLIC PANEL (CC13)
ALT: 17 U/L (ref 0–55)
AST: 14 U/L (ref 5–34)
Albumin: 3.9 g/dL (ref 3.5–5.0)
Alkaline Phosphatase: 57 U/L (ref 40–150)
Anion Gap: 13 mEq/L — ABNORMAL HIGH (ref 3–11)
BUN: 20.2 mg/dL (ref 7.0–26.0)
CHLORIDE: 101 meq/L (ref 98–109)
CO2: 23 mEq/L (ref 22–29)
Calcium: 9.1 mg/dL (ref 8.4–10.4)
Creatinine: 1.2 mg/dL — ABNORMAL HIGH (ref 0.6–1.1)
EGFR: 60 mL/min/{1.73_m2} — ABNORMAL LOW (ref >90)
Glucose: 96 mg/dl (ref 70–140)
Potassium: 3.7 mEq/L (ref 3.5–5.1)
Sodium: 137 mEq/L (ref 136–145)
Total Bilirubin: 0.37 mg/dL (ref 0.20–1.20)
Total Protein: 7 g/dL (ref 6.4–8.3)

## 2014-05-19 LAB — CBC WITH DIFFERENTIAL/PLATELET
BASO%: 0.6 % (ref 0.0–2.0)
Basophils Absolute: 0 10*3/uL (ref 0.0–0.1)
EOS%: 7.7 % — ABNORMAL HIGH (ref 0.0–7.0)
Eosinophils Absolute: 0.3 10*3/uL (ref 0.0–0.5)
HCT: 30.1 % — ABNORMAL LOW (ref 34.8–46.6)
HGB: 10.3 g/dL — ABNORMAL LOW (ref 11.6–15.9)
LYMPH#: 1.4 10*3/uL (ref 0.9–3.3)
LYMPH%: 44.4 % (ref 14.0–49.7)
MCH: 27.2 pg (ref 25.1–34.0)
MCHC: 34.2 g/dL (ref 31.5–36.0)
MCV: 79.6 fL (ref 79.5–101.0)
MONO#: 0.1 10*3/uL (ref 0.1–0.9)
MONO%: 3.1 % (ref 0.0–14.0)
NEUT#: 1.4 10*3/uL — ABNORMAL LOW (ref 1.5–6.5)
NEUT%: 44.2 % (ref 38.4–76.8)
NRBC: 0 % (ref 0–0)
Platelets: 226 10*3/uL (ref 145–400)
RBC: 3.78 10*6/uL (ref 3.70–5.45)
RDW: 14.3 % (ref 11.2–14.5)
WBC: 3.2 10*3/uL — AB (ref 3.9–10.3)

## 2014-05-19 MED ORDER — OXYCODONE HCL 5 MG PO TABS: ORAL_TABLET | ORAL | Status: DC

## 2014-05-19 MED ORDER — TBO-FILGRASTIM 300 MCG/0.5ML ~~LOC~~ SOSY
300.0000 ug | PREFILLED_SYRINGE | Freq: Once | SUBCUTANEOUS | Status: AC
  Administered 2014-05-19: 300 ug via SUBCUTANEOUS
  Filled 2014-05-19: qty 0.5

## 2014-05-19 MED ORDER — DEXAMETHASONE 4 MG PO TABS
ORAL_TABLET | ORAL | Status: DC
Start: 2014-05-19 — End: 2014-06-27

## 2014-05-19 NOTE — Telephone Encounter (Signed)
avs printed for patient and sent back to the lab   anne

## 2014-05-19 NOTE — Progress Notes (Signed)
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   Patient has had a number of issues related to cycle 1 chemotherapy, but is not having any acute problems. She did take full decadron premedication but had possible slight transfusion reaction just after starting taxol, with chest heaviness that resolved promptly after holding the taxol and giving usual supportive medications; she was able to complete the infusion as planned. She used some zofran, last 2 days ago, and ativan x1; she had some mild nausea yesterday and today, such that it has been difficult to eat or drink. She is able to eat fruit. She had leg aches by day 4, some improvement  with ibuprofen, did take Claritin (+zyrtec until RN spoke with her on 4-18); leg aching has nearly resolved. She had loose stools on days 6 and 7, took imodium, bowels now moving normally. She has been fatigued. She denies peripheral neuropathy. She has continued to take Hyzaar 100-125 despite poor po intake and weakness; does not check BP at home.  No PAC CA 125 preoperatively  56 Genetics testing with OvaNext panel sent 05-12-14.   ONCOLOGIC HISTORY   Ovarian cancer, bilateral   03/23/2014 Initial Diagnosis Pelvic mass   03/31/2014 Surgery IIIA1(i) clear cell carcinoma of the ovary  Patient had been in usual good health, up to date on all medical care, when she developed bladder symptoms in early 2016. Urine culture showed no infection, then CT AP at [facility] 03-16-14 found perihepatic ascites and pelvic mass 9.9 x 20.1 x 17.8 cm which was cystic and solid, kidneys and ureters unremarkable, no pelvic fluid, rectosigmoid partially compressed, other organs not remarkable, no mention of  adenopathy (report scanned into this EMR). Past gyn care has been by Dr Alden Hipp, however she was seen promptly by Dr Evie Lacks on referral from Dr Quillian Quince after CT resulted; CT findings confirmed by transvaginal US. CA 125 was 56 pre op. She saw Dr Alycia Rossetti in consultation on 03-23-14, then had surgery at [facility] on 03-31-14 which was TAH/BSO, omentectomy, pelvic and paraaortic node evaluation. Surgical findings were of bilateral complex ovarian masses and dense adhesions, with intra-operative spill of right ovarian mass as that was densely adherent to sigmoid serosa; posterior uterine segment also adherent to rectosigmoid, and retroperitoneal fibrosis bilaterally. Pathology [facility] [phone]) found grade 3 clear cell carcinoma involving both ovaries, largest 13 x 9.5 x 6.2 cm, + LVSI and one right pelvic node with microscopic involvement approximately 1 mm, ER PR negative, pT3a1 pN1 pMx. Cytology from ascites [facility] [id number]) was positive for adenocarcinoma of gyn primary. Multidisciplinary conference at [facility] 04-19-14 recommended carboplatin and taxane chemotherapy. Cycle 1 carbo taxol was given on 05-12-14.  Review of systems as above, also: No fever or symptoms of infection. Bladder ok. No bleeding. No swelling LE. Not clearly orthostatic by history. "I could not have managed at work this week"; she is on week 7 of total 12 weeks leave from work.  Remainder of 10 point Review of Systems negative.  Objective:  Vital signs in last 24 hours:  BP 108/5 mmHg  Pulse 83  Temp(Src) 97.8 F (36.6 C) (Oral)  Resp 18  Ht 5\' 4"  (1.626 m)  Wt 189 lb 14.4 oz (86.138 kg)  BMI 32.58 kg/m2  SpO2 100% Minimal change in orthostatic vitals, see flowsheet. Weight is down 3 lbs. Not in acute distress. Alert, oriented and appropriate, very pleasant and husband very supportive. Ambulatory without difficulty.  No alopecia  HEENT:PERRL, sclerae not icteric. Oral mucosa without lesions, not dry,  posterior pharynx clear.  Neck  supple. No JVD.  Lymphatics:no cervical,supraclavicular adenopathy Resp: clear to auscultation bilaterally and normal percussion bilaterally Cardio: regular rate and rhythm. No gallop. GI: soft, nontender, not distended, no mass or organomegaly. Normally active bowel sounds. Surgical incision not remarkable. Musculoskeletal/ Extremities: without pitting edema, cords, tenderness Neuro: no peripheral neuropathy. Otherwise nonfocal PSYCH appropriate mood and affect. Skin without rash, ecchymosis, petechiae   Lab Results:  Results for orders placed or performed in visit on 05/19/14  CBC with Differential  Result Value Ref Range   WBC 3.2 (L) 3.9 - 10.3 10e3/uL   NEUT# 1.4 (L) 1.5 - 6.5 10e3/uL   HGB 10.3 (L) 11.6 - 15.9 g/dL   HCT 30.1 (L) 34.8 - 46.6 %   Platelets 226 145 - 400 10e3/uL   MCV 79.6 79.5 - 101.0 fL   MCH 27.2 25.1 - 34.0 pg   MCHC 34.2 31.5 - 36.0 g/dL   RBC 3.78 3.70 - 5.45 10e6/uL   RDW 14.3 11.2 - 14.5 %   lymph# 1.4 0.9 - 3.3 10e3/uL   MONO# 0.1 0.1 - 0.9 10e3/uL   Eosinophils Absolute 0.3 0.0 - 0.5 10e3/uL   Basophils Absolute 0.0 0.0 - 0.1 10e3/uL   NEUT% 44.2 38.4 - 76.8 %   LYMPH% 44.4 14.0 - 49.7 %   MONO% 3.1 0.0 - 14.0 %   EOS% 7.7 (H) 0.0 - 7.0 %   BASO% 0.6 0.0 - 2.0 %   nRBC 0 0 - 0 %   CMET redrawn as initial specimen hemolyzed, available after visit has creat 1.2, this having been 1.3 on  4-14 and 1.4 on 05-05-14, otherwise normal  CA 125 on 05-11-14  8, this having been 56 preop.  Studies/Results:  No results found.  Medications: I have reviewed the patient's current medications. Stop Hyzaar, follow BP at home if possible. Granix begun today, will give also 4-23; will give 4-25 if ANC <=1.2 and beyond that if indicated by CBC on 4-25.  DISCUSSION: course since first chemo discussed in detail. Counts not at nadir and already leukopenic so will start granix as above - mechanism of action and possible aches with granix discussed, continue claritin.  She needs to push po fluids and solid foods as she is able. Will need dietician to see. She may still be able to work on part time basis, or may need leave extended while on chemo.  Assessment/Plan: 1. High grade clear cell carcinoma of bilateral ovaries with microscopic involvement of one right pelvic node at complete debulking surgery by Dr Alycia Rossetti at [facility] 03-31-2014, FIGO IIIA1 (i). Every 3 week carboplatin and taxol x 6 cycles begun 05-12-14. I will see her with labs 05-25-14. Add granix now, recheck CBC and BMET on 4-25.  Genetics testing pending.  2.Retroperitoneal fibrosis and dense adhesions at surgery 3.HTN: BPs lower and likely would be better without diuretic now, so will hold Hyzaar. Patient to check BP at home if possible 4.due mammograms upcoming, which are to be scheduled at [facility] 5.previous fulguration of endometriosis, post C section 6.GERD controlled with prilosec 7.rash to sulfa 8.environmental allergies, previously used zyrtec and flonase, now using claritin due to taxol/gCSF aches. 9.Advance Directive information given  All questions answered. Patient and husband know to call if needed prior to next scheduled visit. Time spent 30 min including >50% counseling and coordination of care.     Audrey Porter P, MD   05/19/2014, 2:52 PM

## 2014-05-20 ENCOUNTER — Ambulatory Visit (HOSPITAL_BASED_OUTPATIENT_CLINIC_OR_DEPARTMENT_OTHER): Payer: 59

## 2014-05-20 VITALS — BP 113/75 | HR 84 | Temp 98.4°F | Resp 18

## 2014-05-20 DIAGNOSIS — C562 Malignant neoplasm of left ovary: Principal | ICD-10-CM

## 2014-05-20 DIAGNOSIS — C563 Malignant neoplasm of bilateral ovaries: Secondary | ICD-10-CM

## 2014-05-20 DIAGNOSIS — C561 Malignant neoplasm of right ovary: Secondary | ICD-10-CM

## 2014-05-20 DIAGNOSIS — D701 Agranulocytosis secondary to cancer chemotherapy: Secondary | ICD-10-CM

## 2014-05-20 DIAGNOSIS — Z5189 Encounter for other specified aftercare: Secondary | ICD-10-CM | POA: Diagnosis not present

## 2014-05-20 MED ORDER — TBO-FILGRASTIM 300 MCG/0.5ML ~~LOC~~ SOSY
300.0000 ug | PREFILLED_SYRINGE | Freq: Once | SUBCUTANEOUS | Status: AC
Start: 2014-05-20 — End: 2014-05-20
  Administered 2014-05-20: 300 ug via SUBCUTANEOUS

## 2014-05-21 ENCOUNTER — Other Ambulatory Visit: Payer: Self-pay | Admitting: Oncology

## 2014-05-21 DIAGNOSIS — C562 Malignant neoplasm of left ovary: Principal | ICD-10-CM

## 2014-05-21 DIAGNOSIS — C561 Malignant neoplasm of right ovary: Secondary | ICD-10-CM

## 2014-05-21 DIAGNOSIS — C563 Malignant neoplasm of bilateral ovaries: Secondary | ICD-10-CM

## 2014-05-22 ENCOUNTER — Other Ambulatory Visit (HOSPITAL_BASED_OUTPATIENT_CLINIC_OR_DEPARTMENT_OTHER): Payer: 59

## 2014-05-22 ENCOUNTER — Other Ambulatory Visit: Payer: Self-pay | Admitting: Oncology

## 2014-05-22 ENCOUNTER — Ambulatory Visit: Payer: Self-pay

## 2014-05-22 ENCOUNTER — Ambulatory Visit (HOSPITAL_BASED_OUTPATIENT_CLINIC_OR_DEPARTMENT_OTHER): Payer: 59

## 2014-05-22 VITALS — BP 122/73 | HR 81 | Temp 99.1°F

## 2014-05-22 DIAGNOSIS — C563 Malignant neoplasm of bilateral ovaries: Secondary | ICD-10-CM

## 2014-05-22 DIAGNOSIS — C561 Malignant neoplasm of right ovary: Secondary | ICD-10-CM | POA: Diagnosis not present

## 2014-05-22 DIAGNOSIS — C562 Malignant neoplasm of left ovary: Principal | ICD-10-CM

## 2014-05-22 DIAGNOSIS — D72819 Decreased white blood cell count, unspecified: Secondary | ICD-10-CM

## 2014-05-22 LAB — BASIC METABOLIC PANEL (CC13)
ANION GAP: 14 meq/L — AB (ref 3–11)
BUN: 15.4 mg/dL (ref 7.0–26.0)
CHLORIDE: 104 meq/L (ref 98–109)
CO2: 21 meq/L — AB (ref 22–29)
Calcium: 8.9 mg/dL (ref 8.4–10.4)
Creatinine: 1.3 mg/dL — ABNORMAL HIGH (ref 0.6–1.1)
EGFR: 56 mL/min/{1.73_m2} — ABNORMAL LOW (ref >90)
GLUCOSE: 86 mg/dL (ref 70–140)
Potassium: 3.2 mEq/L — ABNORMAL LOW (ref 3.5–5.1)
Sodium: 139 mEq/L (ref 136–145)

## 2014-05-22 LAB — CBC WITH DIFFERENTIAL/PLATELET
BASO%: 0.4 % (ref 0.0–2.0)
Basophils Absolute: 0 10*3/uL (ref 0.0–0.1)
EOS%: 5.6 % (ref 0.0–7.0)
Eosinophils Absolute: 0.3 10*3/uL (ref 0.0–0.5)
HCT: 26.8 % — ABNORMAL LOW (ref 34.8–46.6)
HGB: 9.3 g/dL — ABNORMAL LOW (ref 11.6–15.9)
LYMPH#: 2.3 10*3/uL (ref 0.9–3.3)
LYMPH%: 52 % — AB (ref 14.0–49.7)
MCH: 27.4 pg (ref 25.1–34.0)
MCHC: 34.7 g/dL (ref 31.5–36.0)
MCV: 79.1 fL — ABNORMAL LOW (ref 79.5–101.0)
MONO#: 1.1 10*3/uL — AB (ref 0.1–0.9)
MONO%: 24.6 % — ABNORMAL HIGH (ref 0.0–14.0)
NEUT%: 17.4 % — AB (ref 38.4–76.8)
NEUTROS ABS: 0.8 10*3/uL — AB (ref 1.5–6.5)
Platelets: 207 10*3/uL (ref 145–400)
RBC: 3.39 10*6/uL — AB (ref 3.70–5.45)
RDW: 13.9 % (ref 11.2–14.5)
WBC: 4.5 10*3/uL (ref 3.9–10.3)

## 2014-05-22 MED ORDER — TBO-FILGRASTIM 300 MCG/0.5ML ~~LOC~~ SOSY
300.0000 ug | PREFILLED_SYRINGE | Freq: Once | SUBCUTANEOUS | Status: AC
  Administered 2014-05-22: 300 ug via SUBCUTANEOUS
  Filled 2014-05-22: qty 0.5

## 2014-05-22 NOTE — Patient Instructions (Signed)
Neutropenia Neutropenia is a condition that occurs when the level of a certain type of white blood cell (neutrophil) in your body becomes lower than normal. Neutrophils are made in the bone marrow and fight infections. These cells protect against bacteria and viruses. The fewer neutrophils you have, and the longer your body remains without them, the greater your risk of getting a severe infection becomes. CAUSES  The cause of neutropenia may be hard to determine. However, it is usually due to 3 main problems:   Decreased production of neutrophils. This may be due to:  Certain medicines such as chemotherapy.  Genetic problems.  Cancer.  Radiation treatments.  Vitamin deficiency.  Some pesticides.  Increased destruction of neutrophils. This may be due to:  Overwhelming infections.  Hemolytic anemia. This is when the body destroys its own blood cells.  Chemotherapy.  Neutrophils moving to areas of the body where they cannot fight infections. This may be due to:  Dialysis procedures.  Conditions where the spleen becomes enlarged. Neutrophils are held in the spleen and are not available to the rest of the body.  Overwhelming infections. The neutrophils are held in the area of the infection and are not available to the rest of the body. SYMPTOMS  There are no specific symptoms of neutropenia. The lack of neutrophils can result in an infection, and an infection can cause various problems. DIAGNOSIS  Diagnosis is made by a blood test. A complete blood count is performed. The normal level of neutrophils in human blood differs with age and race. Infants have lower counts than older children and adults. African Americans have lower counts than Caucasians or Asians. The average adult level is 1500 cells/mm3 of blood. Neutrophil counts are interpreted as follows:  Greater than 1000 cells/mm3 gives normal protection against infection.  500 to 1000 cells/mm3 gives an increased risk for  infection.  200 to 500 cells/mm3 is a greater risk for severe infection.  Lower than 200 cells/mm3 is a marked risk of infection. This may require hospitalization and treatment with antibiotic medicines. TREATMENT  Treatment depends on the underlying cause, severity, and presence of infections or symptoms. It also depends on your health. Your caregiver will discuss the treatment plan with you. Mild cases are often easily treated and have a good outcome. Preventative measures may also be started to limit your risk of infections. Treatment can include:  Taking antibiotics.  Stopping medicines that are known to cause neutropenia.  Correcting nutritional deficiencies by eating green vegetables to supply folic acid and taking vitamin B supplements.  Stopping exposure to pesticides if your neutropenia is related to pesticide exposure.  Taking a blood growth factor called sargramostim, pegfilgrastim, or filgrastim if you are undergoing chemotherapy for cancer. This stimulates white blood cell production.  Removal of the spleen if you have Felty's syndrome and have repeated infections. HOME CARE INSTRUCTIONS   Follow your caregiver's instructions about when you need to have blood work done.  Wash your hands often. Make sure others who come in contact with you also wash their hands.  Wash raw fruits and vegetables before eating them. They can carry bacteria and fungi.  Avoid people with colds or spreadable (contagious) diseases (chickenpox, herpes zoster, influenza).  Avoid large crowds.  Avoid construction areas. The dust can release fungus into the air.  Be cautious around children in daycare or school environments.  Take care of your respiratory system by coughing and deep breathing.  Bathe daily.  Protect your skin from cuts and   burns.  Do not work in the garden or with flowers and plants.  Care for the mouth before and after meals by brushing with a soft toothbrush. If you have  mucositis, do not use mouthwash. Mouthwash contains alcohol and can dry out the mouth even more.  Clean the area between the genitals and the anus (perineal area) after urination and bowel movements. Women need to wipe from front to back.  Use a water soluble lubricant during sexual intercourse and practice good hygiene after. Do not have intercourse if you are severely neutropenic. Check with your caregiver for guidelines.  Exercise daily as tolerated.  Avoid people who were vaccinated with a live vaccine in the past 30 days. You should not receive live vaccines (polio, typhoid).  Do not provide direct care for pets. Avoid animal droppings. Do not clean litter boxes and bird cages.  Do not share food utensils.  Do not use tampons, enemas, or rectal suppositories unless directed by your caregiver.  Use an electric razor to remove hair.  Wash your hands after handling magazines, letters, and newspapers. SEEK IMMEDIATE MEDICAL CARE IF:   You have a fever.  You have chills or start to shake.  You feel nauseous or vomit.  You develop mouth sores.  You develop aches and pains.  You have redness and swelling around open wounds.  Your skin is warm to the touch.  You have pus coming from your wounds.  You develop swollen lymph nodes.  You feel weak or fatigued.  You develop red streaks on the skin. MAKE SURE YOU:  Understand these instructions.  Will watch your condition.  Will get help right away if you are not doing well or get worse. Document Released: 07/05/2001 Document Revised: 04/07/2011 Document Reviewed: 08/02/2010 ExitCare Patient Information 2015 [employer]. This information is not intended to replace advice given to you by your health care provider. Make sure you discuss any questions you have with your health care provider.  

## 2014-05-22 NOTE — Progress Notes (Signed)
ANC 0.8 today   Granix injection given   Will get another injection tomorrow and possibly on Wednesday.   Will recheck labs on Wednesday.  Neutropenic precautions reviewed and face masks given to patient.  States understanding and knows date and time for appointments.

## 2014-05-23 ENCOUNTER — Ambulatory Visit (HOSPITAL_BASED_OUTPATIENT_CLINIC_OR_DEPARTMENT_OTHER): Payer: 59

## 2014-05-23 VITALS — BP 122/73 | HR 78 | Temp 98.6°F

## 2014-05-23 DIAGNOSIS — C562 Malignant neoplasm of left ovary: Principal | ICD-10-CM

## 2014-05-23 DIAGNOSIS — C563 Malignant neoplasm of bilateral ovaries: Secondary | ICD-10-CM

## 2014-05-23 DIAGNOSIS — C561 Malignant neoplasm of right ovary: Secondary | ICD-10-CM | POA: Diagnosis not present

## 2014-05-23 DIAGNOSIS — D72819 Decreased white blood cell count, unspecified: Secondary | ICD-10-CM | POA: Diagnosis not present

## 2014-05-23 MED ORDER — TBO-FILGRASTIM 300 MCG/0.5ML ~~LOC~~ SOSY
300.0000 ug | PREFILLED_SYRINGE | Freq: Once | SUBCUTANEOUS | Status: AC
  Administered 2014-05-23: 300 ug via SUBCUTANEOUS
  Filled 2014-05-23: qty 0.5

## 2014-05-24 ENCOUNTER — Ambulatory Visit: Payer: 59

## 2014-05-24 ENCOUNTER — Other Ambulatory Visit: Payer: Self-pay | Admitting: Oncology

## 2014-05-24 ENCOUNTER — Other Ambulatory Visit (HOSPITAL_BASED_OUTPATIENT_CLINIC_OR_DEPARTMENT_OTHER): Payer: 59

## 2014-05-24 DIAGNOSIS — C563 Malignant neoplasm of bilateral ovaries: Secondary | ICD-10-CM

## 2014-05-24 DIAGNOSIS — C561 Malignant neoplasm of right ovary: Secondary | ICD-10-CM | POA: Diagnosis not present

## 2014-05-24 DIAGNOSIS — C562 Malignant neoplasm of left ovary: Principal | ICD-10-CM

## 2014-05-24 LAB — CBC WITH DIFFERENTIAL/PLATELET
BASO%: 0.3 % (ref 0.0–2.0)
BASOS ABS: 0.1 10*3/uL (ref 0.0–0.1)
EOS%: 1.6 % (ref 0.0–7.0)
Eosinophils Absolute: 0.4 10*3/uL (ref 0.0–0.5)
HEMATOCRIT: 26.1 % — AB (ref 34.8–46.6)
HEMOGLOBIN: 9 g/dL — AB (ref 11.6–15.9)
LYMPH%: 15.5 % (ref 14.0–49.7)
MCH: 27.5 pg (ref 25.1–34.0)
MCHC: 34.5 g/dL (ref 31.5–36.0)
MCV: 79.8 fL (ref 79.5–101.0)
MONO#: 3.2 10*3/uL — AB (ref 0.1–0.9)
MONO%: 12 % (ref 0.0–14.0)
NEUT#: 19 10*3/uL — ABNORMAL HIGH (ref 1.5–6.5)
NEUT%: 70.6 % (ref 38.4–76.8)
PLATELETS: 171 10*3/uL (ref 145–400)
RBC: 3.27 10*6/uL — ABNORMAL LOW (ref 3.70–5.45)
RDW: 14.3 % (ref 11.2–14.5)
WBC: 26.9 10*3/uL — ABNORMAL HIGH (ref 3.9–10.3)
lymph#: 4.2 10*3/uL — ABNORMAL HIGH (ref 0.9–3.3)

## 2014-05-24 LAB — COMPREHENSIVE METABOLIC PANEL (CC13)
ALT: 16 U/L (ref 0–55)
AST: 14 U/L (ref 5–34)
Albumin: 3.4 g/dL — ABNORMAL LOW (ref 3.5–5.0)
Alkaline Phosphatase: 79 U/L (ref 40–150)
Anion Gap: 13 mEq/L — ABNORMAL HIGH (ref 3–11)
BUN: 14.9 mg/dL (ref 7.0–26.0)
CHLORIDE: 107 meq/L (ref 98–109)
CO2: 22 mEq/L (ref 22–29)
CREATININE: 1.2 mg/dL — AB (ref 0.6–1.1)
Calcium: 8.6 mg/dL (ref 8.4–10.4)
EGFR: 59 mL/min/{1.73_m2} — ABNORMAL LOW (ref >90)
Glucose: 119 mg/dl (ref 70–140)
Potassium: 3.3 mEq/L — ABNORMAL LOW (ref 3.5–5.1)
Sodium: 142 mEq/L (ref 136–145)
TOTAL PROTEIN: 6 g/dL — AB (ref 6.4–8.3)

## 2014-05-24 NOTE — Progress Notes (Signed)
Emeree here for lab and possible injection.   ANC 19.0 today, does not need Granix injection today.  Will return as scheduled.

## 2014-05-25 ENCOUNTER — Ambulatory Visit (HOSPITAL_BASED_OUTPATIENT_CLINIC_OR_DEPARTMENT_OTHER): Payer: 59 | Admitting: Oncology

## 2014-05-25 ENCOUNTER — Telehealth: Payer: Self-pay | Admitting: Oncology

## 2014-05-25 ENCOUNTER — Ambulatory Visit: Payer: 59 | Admitting: Gynecologic Oncology

## 2014-05-25 ENCOUNTER — Encounter: Payer: Self-pay | Admitting: Oncology

## 2014-05-25 ENCOUNTER — Other Ambulatory Visit: Payer: Self-pay

## 2014-05-25 VITALS — BP 116/73 | HR 75 | Temp 98.1°F | Resp 18 | Ht 64.0 in | Wt 192.9 lb

## 2014-05-25 DIAGNOSIS — D701 Agranulocytosis secondary to cancer chemotherapy: Secondary | ICD-10-CM

## 2014-05-25 DIAGNOSIS — G620 Drug-induced polyneuropathy: Secondary | ICD-10-CM

## 2014-05-25 DIAGNOSIS — G622 Polyneuropathy due to other toxic agents: Secondary | ICD-10-CM

## 2014-05-25 DIAGNOSIS — T451X5A Adverse effect of antineoplastic and immunosuppressive drugs, initial encounter: Secondary | ICD-10-CM

## 2014-05-25 DIAGNOSIS — C561 Malignant neoplasm of right ovary: Secondary | ICD-10-CM | POA: Diagnosis not present

## 2014-05-25 DIAGNOSIS — I952 Hypotension due to drugs: Secondary | ICD-10-CM | POA: Diagnosis not present

## 2014-05-25 DIAGNOSIS — C562 Malignant neoplasm of left ovary: Secondary | ICD-10-CM

## 2014-05-25 DIAGNOSIS — C563 Malignant neoplasm of bilateral ovaries: Secondary | ICD-10-CM

## 2014-05-25 NOTE — Progress Notes (Signed)
OFFICE PROGRESS NOTE   May 25, 2014   Physicians:Paola Elon Alas, MD (PCP), Alden Hipp), Gari Crown [facility])  INTERVAL HISTORY:  Patient is seen, alone for visit, in continuing attention to adjuvant chemotherapy recently begun for IIIA1 (i) clear cell carcinoma of bilateral ovaries. Cycle 1 carboplatin taxol was given 05-12-14; she was neutropenic by day 11 despite beginning granix on day 8, recovered counts with total 4 doses granix to [place] 19 by day 13.  Patient is feeling very well today. She had aches especially in sternum by end of granix injections, now resolved. She was not febrile when neutropenic. She has had no nausea this week, appetite better, drinking fluids and taste ok. She stopper Hyzaar on 05-19-14 with symptomatic low BP, and has had no further orthostatic symptoms. Bowels are now moving well. She had slight numbness in feet, now resolved, none in hands.     No PAC CA 125 preoperatively 56 Genetics testing with OvaNext panel sent 05-12-14.   ONCOLOGIC HISTORY   Ovarian cancer, bilateral   03/23/2014 Initial Diagnosis Pelvic mass   03/31/2014 Surgery IIIA1(i) clear cell carcinoma of the ovary  Patient had been in usual good health, up to date on all medical care, when she developed bladder symptoms in early 2016. Urine culture showed no infection, then CT AP at [facility] 03-16-14 found perihepatic ascites and pelvic mass 9.9 x 20.1 x 17.8 cm which was cystic and solid, kidneys and ureters unremarkable, no pelvic fluid, rectosigmoid partially compressed, other organs not remarkable, no mention of adenopathy (report scanned into this EMR). Past gyn care has been by Dr Alden Hipp, however she was seen promptly by Dr Evie Lacks on referral from Dr Quillian Quince after CT resulted; CT findings confirmed by transvaginal US. CA 125 was 56 pre op. She saw Dr Alycia Rossetti in consultation on 03-23-14, then had surgery at [facility] on 03-31-14 which was TAH/BSO, omentectomy,  pelvic and paraaortic node evaluation. Surgical findings were of bilateral complex ovarian masses and dense adhesions, with intra-operative spill of right ovarian mass as that was densely adherent to sigmoid serosa; posterior uterine segment also adherent to rectosigmoid, and retroperitoneal fibrosis bilaterally. Pathology [facility] [phone]) found grade 3 clear cell carcinoma involving both ovaries, largest 13 x 9.5 x 6.2 cm, + LVSI and one right pelvic node with microscopic involvement approximately 1 mm, ER PR negative, pT3a1 pN1 pMx. Cytology from ascites [facility] [phone]) was positive for adenocarcinoma of gyn primary. Multidisciplinary conference at [facility] 04-19-14 recommended carboplatin and taxane chemotherapy. Cycle 1 carbo taxol was given on 05-12-14, with neutropenia by day 11 (ANC 0.8) despite having started granix on day 8.        Review of systems as above, also: No fatigue now. No SOB, abdominal or pelvic pain, no LE swelling. No bleeding.  Remainder of 10 point Review of Systems negative.  Objective:  Vital signs in last 24 hours:  BP 116/73 mmHg  Pulse 75  Temp(Src) 98.1 F (36.7 C) (Oral)  Resp 18  Ht '5\' 4"'  (1.626 m)  Wt 192 lb 14.4 oz (87.499 kg)  BMI 33.09 kg/m2 Weight up 3 lbs. Alert, oriented and appropriate. Ambulatory without difficulty, looks comfortable and very pleasant as always. No obvious alopecia  HEENT:PERRL, sclerae not icteric. Oral mucosa moist without lesions, posterior pharynx clear.  Neck supple. No JVD.  Lymphatics:no cervical,supraclavicular, or inguinal adenopathy Resp: clear to auscultation bilaterally and normal percussion bilaterally Cardio: regular rate and rhythm. No gallop. GI: soft, nontender, not distended,  no mass or organomegaly. Normally active bowel sounds. Surgical incision not remarkable. Musculoskeletal/ Extremities: without pitting edema, cords, tenderness Neuro: no peripheral neuropathy. Otherwise nonfocal Skin without rash, ecchymosis,  petechiae   Lab Results:  Results for orders placed or performed in visit on 05/24/14  Comprehensive metabolic panel (Cmet) - [facility]  Result Value Ref Range   Sodium 142 136 - 145 mEq/L   Potassium 3.3 (L) 3.5 - 5.1 mEq/L   Chloride 107 98 - 109 mEq/L   CO2 22 22 - 29 mEq/L   Glucose 119 70 - 140 mg/dl   BUN 14.9 7.0 - 26.0 mg/dL   Creatinine 1.2 (H) 0.6 - 1.1 mg/dL   Total Bilirubin <0.20 0.20 - 1.20 mg/dL   Alkaline Phosphatase 79 40 - 150 U/L   AST 14 5 - 34 U/L   ALT 16 0 - 55 U/L   Total Protein 6.0 (L) 6.4 - 8.3 g/dL   Albumin 3.4 (L) 3.5 - 5.0 g/dL   Calcium 8.6 8.4 - 10.4 mg/dL   Anion Gap 13 (H) 3 - 11 mEq/L   EGFR 59 (L) >90 ml/min/1.73 m2  CBC with Differential  Result Value Ref Range   WBC 26.9 (H) 3.9 - 10.3 10e3/uL   NEUT# 19.0 (H) 1.5 - 6.5 10e3/uL   HGB 9.0 (L) 11.6 - 15.9 g/dL   HCT 26.1 (L) 34.8 - 46.6 %   Platelets 171 145 - 400 10e3/uL   MCV 79.8 79.5 - 101.0 fL   MCH 27.5 25.1 - 34.0 pg   MCHC 34.5 31.5 - 36.0 g/dL   RBC 3.27 (L) 3.70 - 5.45 10e6/uL   RDW 14.3 11.2 - 14.5 %   lymph# 4.2 (H) 0.9 - 3.3 10e3/uL   MONO# 3.2 (H) 0.1 - 0.9 10e3/uL   Eosinophils Absolute 0.4 0.0 - 0.5 10e3/uL   Basophils Absolute 0.1 0.0 - 0.1 10e3/uL   NEUT% 70.6 38.4 - 76.8 %   LYMPH% 15.5 14.0 - 49.7 %   MONO% 12.0 0.0 - 14.0 %   EOS% 1.6 0.0 - 7.0 %   BASO% 0.3 0.0 - 2.0 %    Reviewed labs as above from 05-24-14 with patient, particularly potassium 3.3, creatinine 1.2, counts. Studies/Results:  No results found.  Medications: I have reviewed the patient's current medications. Refill decadron. Stay off Hyzaar.  DISCUSSION: interval history reviewed as above. I expect she will feel well until next treatment planned 06-02-14. Will check CBC and CMET on 5-5 to be sure ok for steroids and treatment, ie if ANC >=1.5 and plt >=100k, creatinine at least stable and Tbili ok Patient hopes to be able to return to work at least on intermittent basis after next chemothearapy  treatment.   Assessment/Plan: 1. High grade clear cell carcinoma of bilateral ovaries with microscopic involvement of one right pelvic node at complete debulking surgery by Dr Alycia Rossetti at [facility] 03-31-2014, FIGO IIIA1 (i). Every 3 week carboplatin and taxol x 6 cycles begun 05-12-14. Neutropenic after cycle 1, would be best to begin granix earlier than day 8 and expect ~ 3 doses adequate. Check labs 5-5 prior to cycle 2 on 5-6. 2.Retroperitoneal fibrosis and dense adhesions at surgery 3.history of HTN however BPs low at initiation of chemo and improved/ in good range off of Hyzaar now. Follow. 4.due mammograms upcoming, which are to be scheduled at [facility] 5.previous fulguration of endometriosis, post C section 6.GERD controlled with prilosec 7.rash to sulfa 8.environmental allergies, previously used zyrtec and flonase, now  using claritin due to taxol/gCSF aches. 9.Advance Directive information given 10. Mild renal insufficiency which preceded chemotherapy: tolerated AUC =6 with cycle 1, tho if creatinine higher would decrease this at least to AUC = 5. Push po fluids. Off Hyzaar now. 11.hypokalemia: discussed increasing in diet with foods specifically recommended, follow  All questions answered and patient is in agreement with recommendations and plans as above. Chemo orders for 5-6  and granix orders entered. Message to RN for labs on 06-01-14. Will give granix on days 7-8-9 to avoid taxol aches. I will see her with labs 06-16-14.  Time spent 25 min including >50% counseling and coordination of care  Conswella Bruney P, MD   05/25/2014, 1:32 PM

## 2014-05-25 NOTE — Telephone Encounter (Signed)
GAVE AND RPINTED APPT SCHED AND AVS FO RPT FOR mAY

## 2014-05-27 DIAGNOSIS — T451X5A Adverse effect of antineoplastic and immunosuppressive drugs, initial encounter: Secondary | ICD-10-CM

## 2014-05-27 DIAGNOSIS — D701 Agranulocytosis secondary to cancer chemotherapy: Secondary | ICD-10-CM | POA: Insufficient documentation

## 2014-05-27 DIAGNOSIS — I952 Hypotension due to drugs: Secondary | ICD-10-CM | POA: Insufficient documentation

## 2014-05-27 DIAGNOSIS — G620 Drug-induced polyneuropathy: Secondary | ICD-10-CM | POA: Insufficient documentation

## 2014-05-29 ENCOUNTER — Telehealth: Payer: Self-pay | Admitting: Oncology

## 2014-05-29 ENCOUNTER — Telehealth: Payer: Self-pay | Admitting: *Deleted

## 2014-05-29 ENCOUNTER — Telehealth: Payer: Self-pay

## 2014-05-29 NOTE — Telephone Encounter (Signed)
ENCOUNTER OPENED IN ERROR

## 2014-05-29 NOTE — Telephone Encounter (Signed)
PT.'S MOTHER FUNERAL IS ON Friday, 06/02/14. COULD SHE HAVE HER TREATMENT ON Saturday?

## 2014-05-29 NOTE — Telephone Encounter (Signed)
injection appointments made and patient will get a new schedule on 5/6

## 2014-05-29 NOTE — Telephone Encounter (Addendum)
Audrey Porter called the office at [phone] and stated that she would come and receive her chemotherapy treatment as scheduled on 06-02-14. Left a message in her voice mail on cell that she can delay the treatment as noted below by Dr. Marko Plume. Extended sympathy for her loss.  Requested she call back tomorrow 05-30-14 to confirm her decision in light of the ability to delay her treatment a week.

## 2014-05-29 NOTE — Telephone Encounter (Signed)
We should delay chemo x 1 week, so no treatment on 5-6 (cannot give chemo on Sat)  Please extend our sympathy for the loss of her mother.  Please move chemo apts and be sure correct time slots in infusion  thanks

## 2014-05-30 NOTE — Telephone Encounter (Signed)
Spoke with Ms. Audrey Porter.  She received message from this RN last night as noted below.  She stated that the funeral was change to Saturday 06-02-14.  She will have treatment on Friday as planned as she usually feels fine the day after treatment.  She wants to keep the schedule this way and not move the dates around  Ms. Audrey Porter will also bring forms to her treatment on 06-01-14 to be filled out for estimation of return to work.  Told her to ask for Managed Care dept on Friday when she registers as they process these forms.  It is a 7-10 business day turnaround time frame.  Ms. Audrey Porter verbalized understanding.Marland Kitchen

## 2014-05-31 ENCOUNTER — Telehealth: Payer: Self-pay | Admitting: Oncology

## 2014-05-31 NOTE — Telephone Encounter (Signed)
Called patient and moved her lab to 5.5

## 2014-06-01 ENCOUNTER — Telehealth: Payer: Self-pay

## 2014-06-01 ENCOUNTER — Other Ambulatory Visit: Payer: Self-pay

## 2014-06-01 ENCOUNTER — Other Ambulatory Visit (HOSPITAL_BASED_OUTPATIENT_CLINIC_OR_DEPARTMENT_OTHER): Payer: 59

## 2014-06-01 ENCOUNTER — Other Ambulatory Visit: Payer: Self-pay | Admitting: Oncology

## 2014-06-01 DIAGNOSIS — C562 Malignant neoplasm of left ovary: Secondary | ICD-10-CM | POA: Diagnosis not present

## 2014-06-01 DIAGNOSIS — C561 Malignant neoplasm of right ovary: Secondary | ICD-10-CM

## 2014-06-01 DIAGNOSIS — C563 Malignant neoplasm of bilateral ovaries: Secondary | ICD-10-CM

## 2014-06-01 DIAGNOSIS — D709 Neutropenia, unspecified: Secondary | ICD-10-CM

## 2014-06-01 LAB — CBC WITH DIFFERENTIAL/PLATELET
BASO%: 0.3 % (ref 0.0–2.0)
Basophils Absolute: 0 10*3/uL (ref 0.0–0.1)
EOS ABS: 0.2 10*3/uL (ref 0.0–0.5)
EOS%: 6 % (ref 0.0–7.0)
HCT: 26.3 % — ABNORMAL LOW (ref 34.8–46.6)
HGB: 9 g/dL — ABNORMAL LOW (ref 11.6–15.9)
LYMPH%: 52.8 % — ABNORMAL HIGH (ref 14.0–49.7)
MCH: 27.7 pg (ref 25.1–34.0)
MCHC: 34.2 g/dL (ref 31.5–36.0)
MCV: 80.9 fL (ref 79.5–101.0)
MONO#: 0.3 10*3/uL (ref 0.1–0.9)
MONO%: 9.6 % (ref 0.0–14.0)
NEUT#: 1.1 10*3/uL — ABNORMAL LOW (ref 1.5–6.5)
NEUT%: 31.3 % — AB (ref 38.4–76.8)
PLATELETS: 176 10*3/uL (ref 145–400)
RBC: 3.25 10*6/uL — ABNORMAL LOW (ref 3.70–5.45)
RDW: 15.8 % — ABNORMAL HIGH (ref 11.2–14.5)
WBC: 3.4 10*3/uL — ABNORMAL LOW (ref 3.9–10.3)
lymph#: 1.8 10*3/uL (ref 0.9–3.3)

## 2014-06-01 LAB — COMPREHENSIVE METABOLIC PANEL (CC13)
ALK PHOS: 56 U/L (ref 40–150)
ALT: 14 U/L (ref 0–55)
AST: 10 U/L (ref 5–34)
Albumin: 3.6 g/dL (ref 3.5–5.0)
Anion Gap: 10 mEq/L (ref 3–11)
BUN: 10.1 mg/dL (ref 7.0–26.0)
CO2: 23 mEq/L (ref 22–29)
CREATININE: 1.1 mg/dL (ref 0.6–1.1)
Calcium: 9.1 mg/dL (ref 8.4–10.4)
Chloride: 108 mEq/L (ref 98–109)
EGFR: 68 mL/min/{1.73_m2} — AB (ref >90)
GLUCOSE: 92 mg/dL (ref 70–140)
Potassium: 4.4 mEq/L (ref 3.5–5.1)
SODIUM: 141 meq/L (ref 136–145)
TOTAL PROTEIN: 6.5 g/dL (ref 6.4–8.3)
Total Bilirubin: 0.38 mg/dL (ref 0.20–1.20)

## 2014-06-01 NOTE — Telephone Encounter (Signed)
Told [name] that Audrey Porter was 1.1.  This is too low for her to receive her treatment tomorrow so she does not need to take the steroid pre med to night. Dr. Marko Plume felt that if she could come in tomorrow 06-02-14 and get a Granix 300 mcg injection this would be prudent as she has her mother's funeral this weekend and will be around a lot of people.  Husband agreeable to injection and will let Audrey Porter know appointment is at 1030 for the injection only. POF sent to schedulers to R/S chemotherapy to 06-09-14 etc.  Granix order is under sign and held for injection nurse to release on 06-02-14.

## 2014-06-02 ENCOUNTER — Ambulatory Visit (HOSPITAL_BASED_OUTPATIENT_CLINIC_OR_DEPARTMENT_OTHER): Payer: 59

## 2014-06-02 ENCOUNTER — Telehealth: Payer: Self-pay | Admitting: Oncology

## 2014-06-02 ENCOUNTER — Other Ambulatory Visit: Payer: Self-pay

## 2014-06-02 ENCOUNTER — Ambulatory Visit: Payer: Self-pay

## 2014-06-02 VITALS — BP 121/66 | HR 89 | Temp 98.6°F

## 2014-06-02 DIAGNOSIS — D709 Neutropenia, unspecified: Secondary | ICD-10-CM | POA: Diagnosis not present

## 2014-06-02 LAB — CA 125: CA 125: 10 U/mL (ref <35)

## 2014-06-02 MED ORDER — TBO-FILGRASTIM 300 MCG/0.5ML ~~LOC~~ SOSY
300.0000 ug | PREFILLED_SYRINGE | Freq: Once | SUBCUTANEOUS | Status: AC
  Administered 2014-06-02: 300 ug via SUBCUTANEOUS
  Filled 2014-06-02: qty 0.5

## 2014-06-02 NOTE — Patient Instructions (Signed)

## 2014-06-02 NOTE — Telephone Encounter (Signed)
Labs/ov per 05/05 POF r/s from 05/06 to 05/12, sent msg to r/s chemo and schedule next visit for chemo, will contact pt with updated schedule... KJ

## 2014-06-05 ENCOUNTER — Encounter: Payer: Self-pay | Admitting: Genetic Counselor

## 2014-06-05 ENCOUNTER — Ambulatory Visit (HOSPITAL_COMMUNITY)
Admission: RE | Admit: 2014-06-05 | Discharge: 2014-06-05 | Disposition: A | Discharge status: Home / Self Care | Payer: 59 | Source: Ambulatory Visit | Attending: Gynecologic Oncology | Admitting: Gynecologic Oncology

## 2014-06-05 DIAGNOSIS — C563 Malignant neoplasm of bilateral ovaries: Secondary | ICD-10-CM

## 2014-06-05 DIAGNOSIS — Z803 Family history of malignant neoplasm of breast: Secondary | ICD-10-CM

## 2014-06-05 DIAGNOSIS — Z1231 Encounter for screening mammogram for malignant neoplasm of breast: Secondary | ICD-10-CM | POA: Insufficient documentation

## 2014-06-05 DIAGNOSIS — C561 Malignant neoplasm of right ovary: Secondary | ICD-10-CM

## 2014-06-05 DIAGNOSIS — C562 Malignant neoplasm of left ovary: Secondary | ICD-10-CM

## 2014-06-05 DIAGNOSIS — Z800 Family history of malignant neoplasm of digestive organs: Secondary | ICD-10-CM

## 2014-06-05 NOTE — Progress Notes (Signed)
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 Fifield was previously seen in the [facility] due to concerns regarding a hereditary predisposition to cancer. Please refer to our prior cancer genetics clinic note for more information regarding Ms. [place] medical, social and family histories, and our assessment and recommendations, at the time. Ms. Faux genetic test results and recommendations warranted by these results were recently disclosed to her and are discussed in more detail below.  GENETIC TEST RESULTS: At the time of Ms. Ramona visit, we recommended she pursue genetic testing, which includes sequencing and deletion/duplication analysis of several genes associated with an increased risk for cancer via a gene panel. The OvaNext gene panel offered by [employer] includes sequencing and rearrangement analysis for the following 24 genes:ATM, BARD1, BRCA1, BRCA2, BRIP1, CDH1, CHEK2, EPCAM, MLH1, MRE11A, MSH2, MSH6, MUTYH, NBN, NF1, PALB2, PMS2, PTEN, RAD50, RAD51C, RAD51D, SMARCA4, STK11, and TP53. Genetic testing for this gene panel was normal and did not reveal a pathogenic mutation in any of these genes. A copy of the genetic test report will be scanned into Epic under the media tab.   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 At this time, it is unknown if this variant is associated with an increased risk for cancer or if this is a normal finding. With time, we suspect the lab will reclassify this variant and when they do, we will try to re-contact Ms.  Sebastiano to discuss the reclassification further.   We discussed with Ms. Gammage that current genetic testing is not perfect, and it is, therefore, possible there may be a pathogenic gene mutation in one of these genes that current testing cannot detect, but that chance is small.  We also discussed, that it is possible that another gene that has not yet been discovered, or that we have not yet tested, is responsible for the cancer diagnoses in her family. It is, therefore, important for Ms. Dault to continue to remain in touch with cancer genetics so that we can continue to offer Ms. Chamberlain the most up to date genetic testing.   CANCER SCREENING RECOMMENDATIONS: This result is reassuring and indicates that Ms. Sciandra likely does not have an increased risk for a future cancer due to a mutation in one of these genes. This normal test also suggests that Ms. Sanford cancer was most likely not due to an inherited predisposition associated with one of these genes.  Most cancers happen by chance and this negative test suggests that her cancer falls into this category.  We, therefore, recommended she continue to follow the cancer management and screening guidelines provided by her oncology and primary healthcare providers.   RECOMMENDATIONS FOR FAMILY MEMBERS:  Individuals in this family might be at some increased risk of developing cancer, over the general population risk, simply due to the family history of cancer.  We recommended women in this family have a yearly mammogram beginning at age 40, an annual clinical breast exam, and perform monthly breast self-exams. Women in this family should also have a gynecological exam as recommended by their primary provider. All family members should have a colonoscopy by age 58, an  annual dermatological exam and continue to follow cancer screening guidelines recommended by their healthcare provider.  FOLLOW-UP: Lastly, we discussed with Ms. Crumbley that cancer  genetics is a rapidly advancing field and it is likely that new genetic tests will be appropriate for her and/or family members in the future. We encouraged her to remain in contact with cancer genetics on an annual basis so we can update her personal and family histories and let her know of advances in cancer genetics that may benefit this family.   Our contact number was provided. Ms. Coombes questions were answered to her satisfaction, and she knows she is welcome to call us at anytime with additional questions or concerns.    Catherine A. Fine, MS, CGC Certified Psychologist, [email or web address] Phone: [phone]

## 2014-06-06 ENCOUNTER — Ambulatory Visit: Payer: Self-pay | Admitting: Oncology

## 2014-06-06 ENCOUNTER — Other Ambulatory Visit: Payer: Self-pay

## 2014-06-07 ENCOUNTER — Encounter: Payer: Self-pay | Admitting: Oncology

## 2014-06-07 ENCOUNTER — Telehealth: Payer: Self-pay

## 2014-06-07 NOTE — Telephone Encounter (Signed)
Audrey Porter appointment is at 1530 Thursday 06-08-14 for labs to see if she her counts ar good for a treatment.  She will wait for results from Dr. Mariana Kaufman nurse as she may need to get a granix injection if ANC to low.  Treatment is Friday 06-09-14 at 1200. Audrey Porter also would like to pick up a letter from Dr. Marko Plume Friday 06-09-14 stating for her employer her estimated date to return to work would be. Audrey Porter  States that her FMLA will finish on 06-20-14.  She would like to return to work 06-21-14.  Could letter state that she can work as she is able pending her lab counts and side-effects from her treatment?

## 2014-06-08 ENCOUNTER — Other Ambulatory Visit: Payer: Self-pay | Admitting: Gynecologic Oncology

## 2014-06-08 ENCOUNTER — Other Ambulatory Visit: Payer: Self-pay | Admitting: Urology

## 2014-06-08 ENCOUNTER — Ambulatory Visit: Payer: Self-pay

## 2014-06-08 ENCOUNTER — Ambulatory Visit (HOSPITAL_BASED_OUTPATIENT_CLINIC_OR_DEPARTMENT_OTHER): Payer: 59

## 2014-06-08 ENCOUNTER — Other Ambulatory Visit: Payer: Self-pay | Admitting: Oncology

## 2014-06-08 ENCOUNTER — Other Ambulatory Visit (HOSPITAL_BASED_OUTPATIENT_CLINIC_OR_DEPARTMENT_OTHER): Payer: 59

## 2014-06-08 ENCOUNTER — Other Ambulatory Visit: Payer: Self-pay | Admitting: *Deleted

## 2014-06-08 ENCOUNTER — Telehealth: Payer: Self-pay | Admitting: Oncology

## 2014-06-08 VITALS — BP 132/76 | HR 72 | Temp 98.0°F

## 2014-06-08 DIAGNOSIS — N6310 Unspecified lump in the right breast, unspecified quadrant: Secondary | ICD-10-CM

## 2014-06-08 DIAGNOSIS — C562 Malignant neoplasm of left ovary: Secondary | ICD-10-CM | POA: Diagnosis not present

## 2014-06-08 DIAGNOSIS — C563 Malignant neoplasm of bilateral ovaries: Secondary | ICD-10-CM

## 2014-06-08 DIAGNOSIS — D709 Neutropenia, unspecified: Secondary | ICD-10-CM | POA: Diagnosis not present

## 2014-06-08 DIAGNOSIS — C561 Malignant neoplasm of right ovary: Secondary | ICD-10-CM | POA: Diagnosis not present

## 2014-06-08 LAB — CBC WITH DIFFERENTIAL/PLATELET
BASO%: 0.5 % (ref 0.0–2.0)
Basophils Absolute: 0 10*3/uL (ref 0.0–0.1)
EOS%: 4.5 % (ref 0.0–7.0)
Eosinophils Absolute: 0.2 10*3/uL (ref 0.0–0.5)
HCT: 28.5 % — ABNORMAL LOW (ref 34.8–46.6)
HGB: 9.8 g/dL — ABNORMAL LOW (ref 11.6–15.9)
LYMPH%: 47.8 % (ref 14.0–49.7)
MCH: 28.7 pg (ref 25.1–34.0)
MCHC: 34.4 g/dL (ref 31.5–36.0)
MCV: 83.3 fL (ref 79.5–101.0)
MONO#: 0.6 10*3/uL (ref 0.1–0.9)
MONO%: 15.5 % — ABNORMAL HIGH (ref 0.0–14.0)
NEUT%: 31.7 % — AB (ref 38.4–76.8)
NEUTROS ABS: 1.2 10*3/uL — AB (ref 1.5–6.5)
PLATELETS: 371 10*3/uL (ref 145–400)
RBC: 3.42 10*6/uL — AB (ref 3.70–5.45)
RDW: 17.6 % — ABNORMAL HIGH (ref 11.2–14.5)
WBC: 3.8 10*3/uL — ABNORMAL LOW (ref 3.9–10.3)
lymph#: 1.8 10*3/uL (ref 0.9–3.3)

## 2014-06-08 LAB — COMPREHENSIVE METABOLIC PANEL (CC13)
ALK PHOS: 62 U/L (ref 40–150)
ALT: 12 U/L (ref 0–55)
AST: 12 U/L (ref 5–34)
Albumin: 3.7 g/dL (ref 3.5–5.0)
Anion Gap: 10 mEq/L (ref 3–11)
BILIRUBIN TOTAL: 0.26 mg/dL (ref 0.20–1.20)
BUN: 13.4 mg/dL (ref 7.0–26.0)
CO2: 25 mEq/L (ref 22–29)
CREATININE: 1.2 mg/dL — AB (ref 0.6–1.1)
Calcium: 9.2 mg/dL (ref 8.4–10.4)
Chloride: 109 mEq/L (ref 98–109)
EGFR: 59 mL/min/{1.73_m2} — AB (ref >90)
GLUCOSE: 90 mg/dL (ref 70–140)
Potassium: 4.4 mEq/L (ref 3.5–5.1)
Sodium: 144 mEq/L (ref 136–145)
Total Protein: 6.7 g/dL (ref 6.4–8.3)

## 2014-06-08 MED ORDER — TBO-FILGRASTIM 300 MCG/0.5ML ~~LOC~~ SOSY
300.0000 ug | PREFILLED_SYRINGE | Freq: Once | SUBCUTANEOUS | Status: AC
Start: 2014-06-08 — End: 2014-06-08
  Administered 2014-06-08: 300 ug via SUBCUTANEOUS
  Filled 2014-06-08: qty 0.5

## 2014-06-08 NOTE — Telephone Encounter (Signed)
Appointments added per pof and a note was placed for her to get a new schedule 5/13

## 2014-06-09 ENCOUNTER — Other Ambulatory Visit: Payer: Self-pay | Admitting: Oncology

## 2014-06-09 ENCOUNTER — Ambulatory Visit: Payer: Self-pay

## 2014-06-09 ENCOUNTER — Telehealth: Payer: Self-pay | Admitting: *Deleted

## 2014-06-09 ENCOUNTER — Ambulatory Visit (HOSPITAL_BASED_OUTPATIENT_CLINIC_OR_DEPARTMENT_OTHER): Payer: 59

## 2014-06-09 VITALS — BP 126/80 | HR 73 | Temp 98.4°F | Resp 18

## 2014-06-09 DIAGNOSIS — C562 Malignant neoplasm of left ovary: Principal | ICD-10-CM

## 2014-06-09 DIAGNOSIS — C563 Malignant neoplasm of bilateral ovaries: Secondary | ICD-10-CM

## 2014-06-09 DIAGNOSIS — D709 Neutropenia, unspecified: Secondary | ICD-10-CM | POA: Diagnosis not present

## 2014-06-09 DIAGNOSIS — C561 Malignant neoplasm of right ovary: Secondary | ICD-10-CM

## 2014-06-09 MED ORDER — TBO-FILGRASTIM 300 MCG/0.5ML ~~LOC~~ SOSY
300.0000 ug | PREFILLED_SYRINGE | Freq: Once | SUBCUTANEOUS | Status: AC
  Administered 2014-06-09: 300 ug via SUBCUTANEOUS
  Filled 2014-06-09: qty 0.5

## 2014-06-09 NOTE — Progress Notes (Signed)
Medical Oncology  Cycle 2 chemo delayed from 06-02-14  to 06-12-14 due to leukopenia.  Cycle 2 to be given on 5-16 if Garrett >=1.5 and plt >=100k. Carbo dose has been decreased to AUC = 5 and taxol dose has been decreased to 135 mg/m2 due to delay. If counts do not meet parameters on 5-16, hold chemo that day and notify MD of counts on 5-17.   Request sent to financial staff for neulasta,, which will be given with MD visit on 5-20 if approved.  Godfrey Pick, MD

## 2014-06-09 NOTE — Telephone Encounter (Addendum)
Left VM on patient's cell phone letting her know that return to work letter is in an envelope at the [facility] front desk for her to pickup today when she comes for her injection. Told patient to please pick up a new schedule today as her chemo is now scheduled for Monday, 06/12/14, along with lab priors to that visit to insure her counts are good for treatment.

## 2014-06-09 NOTE — Telephone Encounter (Signed)
Called patient and she confirmed she picked up new schedule and return to work letter at [employer] [facility] [facility] [employer] today. Reminded patient to take her decadron prior to treatment on Monday - patient agreeable to this.

## 2014-06-09 NOTE — Telephone Encounter (Signed)
VM received to call pt back. Spoke with pt regarding upcoming appts including today's injection appt. She verbalized understanding of appts, getting new schedule and picking up return to work letter at [employer].

## 2014-06-09 NOTE — Patient Instructions (Signed)

## 2014-06-10 ENCOUNTER — Ambulatory Visit: Payer: Self-pay

## 2014-06-12 ENCOUNTER — Other Ambulatory Visit (HOSPITAL_BASED_OUTPATIENT_CLINIC_OR_DEPARTMENT_OTHER): Payer: 59

## 2014-06-12 ENCOUNTER — Ambulatory Visit (HOSPITAL_BASED_OUTPATIENT_CLINIC_OR_DEPARTMENT_OTHER): Payer: 59

## 2014-06-12 VITALS — BP 136/71 | HR 88 | Temp 97.7°F | Resp 18

## 2014-06-12 DIAGNOSIS — C561 Malignant neoplasm of right ovary: Secondary | ICD-10-CM | POA: Diagnosis not present

## 2014-06-12 DIAGNOSIS — C563 Malignant neoplasm of bilateral ovaries: Secondary | ICD-10-CM

## 2014-06-12 DIAGNOSIS — C562 Malignant neoplasm of left ovary: Secondary | ICD-10-CM

## 2014-06-12 DIAGNOSIS — Z5111 Encounter for antineoplastic chemotherapy: Secondary | ICD-10-CM

## 2014-06-12 LAB — COMPREHENSIVE METABOLIC PANEL (CC13)
ALK PHOS: 98 U/L (ref 40–150)
ALT: 15 U/L (ref 0–55)
ANION GAP: 14 meq/L — AB (ref 3–11)
AST: 12 U/L (ref 5–34)
Albumin: 4.1 g/dL (ref 3.5–5.0)
BILIRUBIN TOTAL: 0.29 mg/dL (ref 0.20–1.20)
BUN: 16.2 mg/dL (ref 7.0–26.0)
CO2: 18 meq/L — AB (ref 22–29)
CREATININE: 1.3 mg/dL — AB (ref 0.6–1.1)
Calcium: 9.7 mg/dL (ref 8.4–10.4)
Chloride: 109 mEq/L (ref 98–109)
EGFR: 52 mL/min/{1.73_m2} — AB (ref >90)
GLUCOSE: 220 mg/dL — AB (ref 70–140)
Potassium: 4.1 mEq/L (ref 3.5–5.1)
Sodium: 140 mEq/L (ref 136–145)
Total Protein: 7.6 g/dL (ref 6.4–8.3)

## 2014-06-12 LAB — CBC WITH DIFFERENTIAL/PLATELET
BASO%: 0.2 % (ref 0.0–2.0)
BASOS ABS: 0 10*3/uL (ref 0.0–0.1)
EOS ABS: 0 10*3/uL (ref 0.0–0.5)
EOS%: 0 % (ref 0.0–7.0)
HCT: 34.4 % — ABNORMAL LOW (ref 34.8–46.6)
HEMOGLOBIN: 11.4 g/dL — AB (ref 11.6–15.9)
LYMPH%: 9.4 % — AB (ref 14.0–49.7)
MCH: 28.2 pg (ref 25.1–34.0)
MCHC: 33.1 g/dL (ref 31.5–36.0)
MCV: 85.3 fL (ref 79.5–101.0)
MONO#: 0.1 10*3/uL (ref 0.1–0.9)
MONO%: 0.5 % (ref 0.0–14.0)
NEUT#: 10.4 10*3/uL — ABNORMAL HIGH (ref 1.5–6.5)
NEUT%: 89.9 % — AB (ref 38.4–76.8)
PLATELETS: 537 10*3/uL — AB (ref 145–400)
RBC: 4.03 10*6/uL (ref 3.70–5.45)
RDW: 19.6 % — ABNORMAL HIGH (ref 11.2–14.5)
WBC: 11.6 10*3/uL — ABNORMAL HIGH (ref 3.9–10.3)
lymph#: 1.1 10*3/uL (ref 0.9–3.3)

## 2014-06-12 MED ORDER — FAMOTIDINE IN NACL 20-0.9 MG/50ML-% IV SOLN
20.0000 mg | Freq: Once | INTRAVENOUS | Status: AC
  Administered 2014-06-12: 20 mg via INTRAVENOUS

## 2014-06-12 MED ORDER — FAMOTIDINE IN NACL 20-0.9 MG/50ML-% IV SOLN
INTRAVENOUS | Status: AC
  Filled 2014-06-12: qty 50

## 2014-06-12 MED ORDER — PACLITAXEL CHEMO INJECTION 300 MG/50ML
135.0000 mg/m2 | Freq: Once | INTRAVENOUS | Status: AC
  Administered 2014-06-12: 270 mg via INTRAVENOUS
  Filled 2014-06-12: qty 45

## 2014-06-12 MED ORDER — DIPHENHYDRAMINE HCL 50 MG/ML IJ SOLN
INTRAMUSCULAR | Status: AC
  Filled 2014-06-12: qty 1

## 2014-06-12 MED ORDER — SODIUM CHLORIDE 0.9 % IV SOLN
Freq: Once | INTRAVENOUS | Status: AC
  Administered 2014-06-12: 12:00:00 via INTRAVENOUS

## 2014-06-12 MED ORDER — DIPHENHYDRAMINE HCL 50 MG/ML IJ SOLN
50.0000 mg | Freq: Once | INTRAMUSCULAR | Status: AC
  Administered 2014-06-12: 50 mg via INTRAVENOUS

## 2014-06-12 MED ORDER — SODIUM CHLORIDE 0.9 % IV SOLN
465.0000 mg | Freq: Once | INTRAVENOUS | Status: AC
  Administered 2014-06-12: 470 mg via INTRAVENOUS
  Filled 2014-06-12: qty 47

## 2014-06-12 MED ORDER — SODIUM CHLORIDE 0.9 % IV SOLN
Freq: Once | INTRAVENOUS | Status: AC
  Administered 2014-06-12: 13:00:00 via INTRAVENOUS
  Filled 2014-06-12: qty 8

## 2014-06-12 NOTE — Patient Instructions (Signed)
Audrey Porter Discharge Instructions for Patients Receiving Chemotherapy  Today you received the following chemotherapy agents Taxol/Carboplatin  To help prevent nausea and vomiting after your treatment, we encourage you to take your nausea medication as needed   If you develop nausea and vomiting that is not controlled by your nausea medication, call the clinic.   BELOW ARE SYMPTOMS THAT SHOULD BE REPORTED IMMEDIATELY:  *FEVER GREATER THAN 100.5 F  *CHILLS WITH OR WITHOUT FEVER  NAUSEA AND VOMITING THAT IS NOT CONTROLLED WITH YOUR NAUSEA MEDICATION  *UNUSUAL SHORTNESS OF BREATH  *UNUSUAL BRUISING OR BLEEDING  TENDERNESS IN MOUTH AND THROAT WITH OR WITHOUT PRESENCE OF ULCERS  *URINARY PROBLEMS  *BOWEL PROBLEMS  UNUSUAL RASH Items with * indicate a potential emergency and should be followed up as soon as possible.  Feel free to call the clinic you have any questions or concerns. The clinic phone number is [phone].  Please show the [facility] at check-in to the Emergency Department and triage nurse.  Carboplatin injection What is this medicine? CARBOPLATIN (KAR boe pla tin) is a chemotherapy drug. It targets fast dividing cells, like cancer cells, and causes these cells to die. This medicine is used to treat ovarian cancer and many other cancers. This medicine may be used for other purposes; ask your health care provider or pharmacist if you have questions. COMMON BRAND NAME(S): Paraplatin What should I tell my health care provider before I take this medicine? They need to know if you have any of these conditions: -blood disorders -hearing problems -kidney disease -recent or ongoing radiation therapy -an unusual or allergic reaction to carboplatin, cisplatin, other chemotherapy, other medicines, foods, dyes, or preservatives -pregnant or trying to get pregnant -breast-feeding How should I use this medicine? This drug is usually given as an  infusion into a vein. It is administered in a hospital or clinic by a specially trained health care professional. Talk to your pediatrician regarding the use of this medicine in children. Special care may be needed. Overdosage: If you think you have taken too much of this medicine contact a poison control center or emergency room at once. NOTE: This medicine is only for you. Do not share this medicine with others. What if I miss a dose? It is important not to miss a dose. Call your doctor or health care professional if you are unable to keep an appointment. What may interact with this medicine? -medicines for seizures -medicines to increase blood counts like filgrastim, pegfilgrastim, sargramostim -some antibiotics like amikacin, gentamicin, neomycin, streptomycin, tobramycin -vaccines Talk to your doctor or health care professional before taking any of these medicines: -acetaminophen -aspirin -ibuprofen -ketoprofen -naproxen This list may not describe all possible interactions. Give your health care provider a list of all the medicines, herbs, non-prescription drugs, or dietary supplements you use. Also tell them if you smoke, drink alcohol, or use illegal drugs. Some items may interact with your medicine. What should I watch for while using this medicine? Your condition will be monitored carefully while you are receiving this medicine. You will need important blood work done while you are taking this medicine. This drug may make you feel generally unwell. This is not uncommon, as chemotherapy can affect healthy cells as well as cancer cells. Report any side effects. Continue your course of treatment even though you feel ill unless your doctor tells you to stop. In some cases, you may be given additional medicines to help with side effects. Follow all directions  for their use. Call your doctor or health care professional for advice if you get a fever, chills or sore throat, or other symptoms  of a cold or flu. Do not treat yourself. This drug decreases your body's ability to fight infections. Try to avoid being around people who are sick. This medicine may increase your risk to bruise or bleed. Call your doctor or health care professional if you notice any unusual bleeding. Be careful brushing and flossing your teeth or using a toothpick because you may get an infection or bleed more easily. If you have any dental work done, tell your dentist you are receiving this medicine. Avoid taking products that contain aspirin, acetaminophen, ibuprofen, naproxen, or ketoprofen unless instructed by your doctor. These medicines may hide a fever. Do not become pregnant while taking this medicine. Women should inform their doctor if they wish to become pregnant or think they might be pregnant. There is a potential for serious side effects to an unborn child. Talk to your health care professional or pharmacist for more information. Do not breast-feed an infant while taking this medicine. What side effects may I notice from receiving this medicine? Side effects that you should report to your doctor or health care professional as soon as possible: -allergic reactions like skin rash, itching or hives, swelling of the face, lips, or tongue -signs of infection - fever or chills, cough, sore throat, pain or difficulty passing urine -signs of decreased platelets or bleeding - bruising, pinpoint red spots on the skin, black, tarry stools, nosebleeds -signs of decreased red blood cells - unusually weak or tired, fainting spells, lightheadedness -breathing problems -changes in hearing -changes in vision -chest pain -high blood pressure -low blood counts - This drug may decrease the number of white blood cells, red blood cells and platelets. You may be at increased risk for infections and bleeding. -nausea and vomiting -pain, swelling, redness or irritation at the injection site -pain, tingling, numbness in the  hands or feet -problems with balance, talking, walking -trouble passing urine or change in the amount of urine Side effects that usually do not require medical attention (report to your doctor or health care professional if they continue or are bothersome): -hair loss -loss of appetite -metallic taste in the mouth or changes in taste This list may not describe all possible side effects. Call your doctor for medical advice about side effects. You may report side effects to FDA at 1-800-FDA-1088. Where should I keep my medicine? This drug is given in a hospital or clinic and will not be stored at home. NOTE: This sheet is a summary. It may not cover all possible information. If you have questions about this medicine, talk to your doctor, pharmacist, or health care provider.  2015, Elsevier/Gold Standard. (2007-04-20 14:38:05) Paclitaxel injection What is this medicine? PACLITAXEL (PAK li TAX el) is a chemotherapy drug. It targets fast dividing cells, like cancer cells, and causes these cells to die. This medicine is used to treat ovarian cancer, breast cancer, and other cancers. This medicine may be used for other purposes; ask your health care provider or pharmacist if you have questions. COMMON BRAND NAME(S): Onxol, Taxol What should I tell my health care provider before I take this medicine? They need to know if you have any of these conditions: -blood disorders -irregular heartbeat -infection (especially a virus infection such as chickenpox, cold sores, or herpes) -liver disease -previous or ongoing radiation therapy -an unusual or allergic reaction to paclitaxel, alcohol, polyoxyethylated  castor oil, other chemotherapy agents, other medicines, foods, dyes, or preservatives -pregnant or trying to get pregnant -breast-feeding How should I use this medicine? This drug is given as an infusion into a vein. It is administered in a hospital or clinic by a specially trained health care  professional. Talk to your pediatrician regarding the use of this medicine in children. Special care may be needed. Overdosage: If you think you have taken too much of this medicine contact a poison control center or emergency room at once. NOTE: This medicine is only for you. Do not share this medicine with others. What if I miss a dose? It is important not to miss your dose. Call your doctor or health care professional if you are unable to keep an appointment. What may interact with this medicine? Do not take this medicine with any of the following medications: -disulfiram -metronidazole This medicine may also interact with the following medications: -cyclosporine -diazepam -ketoconazole -medicines to increase blood counts like filgrastim, pegfilgrastim, sargramostim -other chemotherapy drugs like cisplatin, doxorubicin, epirubicin, etoposide, teniposide, vincristine -quinidine -testosterone -vaccines -verapamil Talk to your doctor or health care professional before taking any of these medicines: -acetaminophen -aspirin -ibuprofen -ketoprofen -naproxen This list may not describe all possible interactions. Give your health care provider a list of all the medicines, herbs, non-prescription drugs, or dietary supplements you use. Also tell them if you smoke, drink alcohol, or use illegal drugs. Some items may interact with your medicine. What should I watch for while using this medicine? Your condition will be monitored carefully while you are receiving this medicine. You will need important blood work done while you are taking this medicine. This drug may make you feel generally unwell. This is not uncommon, as chemotherapy can affect healthy cells as well as cancer cells. Report any side effects. Continue your course of treatment even though you feel ill unless your doctor tells you to stop. In some cases, you may be given additional medicines to help with side effects. Follow all  directions for their use. Call your doctor or health care professional for advice if you get a fever, chills or sore throat, or other symptoms of a cold or flu. Do not treat yourself. This drug decreases your body's ability to fight infections. Try to avoid being around people who are sick. This medicine may increase your risk to bruise or bleed. Call your doctor or health care professional if you notice any unusual bleeding. Be careful brushing and flossing your teeth or using a toothpick because you may get an infection or bleed more easily. If you have any dental work done, tell your dentist you are receiving this medicine. Avoid taking products that contain aspirin, acetaminophen, ibuprofen, naproxen, or ketoprofen unless instructed by your doctor. These medicines may hide a fever. Do not become pregnant while taking this medicine. Women should inform their doctor if they wish to become pregnant or think they might be pregnant. There is a potential for serious side effects to an unborn child. Talk to your health care professional or pharmacist for more information. Do not breast-feed an infant while taking this medicine. Men are advised not to father a child while receiving this medicine. What side effects may I notice from receiving this medicine? Side effects that you should report to your doctor or health care professional as soon as possible: -allergic reactions like skin rash, itching or hives, swelling of the face, lips, or tongue -low blood counts - This drug may decrease the number  of white blood cells, red blood cells and platelets. You may be at increased risk for infections and bleeding. -signs of infection - fever or chills, cough, sore throat, pain or difficulty passing urine -signs of decreased platelets or bleeding - bruising, pinpoint red spots on the skin, black, tarry stools, nosebleeds -signs of decreased red blood cells - unusually weak or tired, fainting spells,  lightheadedness -breathing problems -chest pain -high or low blood pressure -mouth sores -nausea and vomiting -pain, swelling, redness or irritation at the injection site -pain, tingling, numbness in the hands or feet -slow or irregular heartbeat -swelling of the ankle, feet, hands Side effects that usually do not require medical attention (report to your doctor or health care professional if they continue or are bothersome): -bone pain -complete hair loss including hair on your head, underarms, pubic hair, eyebrows, and eyelashes -changes in the color of fingernails -diarrhea -loosening of the fingernails -loss of appetite -muscle or joint pain -red flush to skin -sweating This list may not describe all possible side effects. Call your doctor for medical advice about side effects. You may report side effects to FDA at 1-800-FDA-1088. Where should I keep my medicine? This drug is given in a hospital or clinic and will not be stored at home. NOTE: This sheet is a summary. It may not cover all possible information. If you have questions about this medicine, talk to your doctor, pharmacist, or health care provider.  2015, Elsevier/Gold Standard. (2012-03-08 16:41:21)

## 2014-06-14 ENCOUNTER — Other Ambulatory Visit: Payer: Self-pay | Admitting: Oncology

## 2014-06-15 ENCOUNTER — Ambulatory Visit: Payer: Self-pay

## 2014-06-16 ENCOUNTER — Ambulatory Visit (HOSPITAL_BASED_OUTPATIENT_CLINIC_OR_DEPARTMENT_OTHER): Payer: 59 | Admitting: Oncology

## 2014-06-16 ENCOUNTER — Telehealth: Payer: Self-pay | Admitting: *Deleted

## 2014-06-16 ENCOUNTER — Ambulatory Visit (HOSPITAL_BASED_OUTPATIENT_CLINIC_OR_DEPARTMENT_OTHER): Payer: 59

## 2014-06-16 ENCOUNTER — Other Ambulatory Visit (HOSPITAL_BASED_OUTPATIENT_CLINIC_OR_DEPARTMENT_OTHER): Payer: 59

## 2014-06-16 ENCOUNTER — Encounter: Payer: Self-pay | Admitting: Oncology

## 2014-06-16 VITALS — BP 127/84 | HR 85 | Temp 97.8°F | Resp 18 | Ht 64.0 in | Wt 192.0 lb

## 2014-06-16 DIAGNOSIS — C561 Malignant neoplasm of right ovary: Secondary | ICD-10-CM

## 2014-06-16 DIAGNOSIS — C562 Malignant neoplasm of left ovary: Secondary | ICD-10-CM | POA: Diagnosis not present

## 2014-06-16 DIAGNOSIS — D709 Neutropenia, unspecified: Secondary | ICD-10-CM

## 2014-06-16 DIAGNOSIS — D562 Delta-beta thalassemia: Secondary | ICD-10-CM

## 2014-06-16 DIAGNOSIS — N289 Disorder of kidney and ureter, unspecified: Secondary | ICD-10-CM

## 2014-06-16 DIAGNOSIS — T451X5A Adverse effect of antineoplastic and immunosuppressive drugs, initial encounter: Secondary | ICD-10-CM

## 2014-06-16 DIAGNOSIS — I10 Essential (primary) hypertension: Secondary | ICD-10-CM

## 2014-06-16 DIAGNOSIS — D701 Agranulocytosis secondary to cancer chemotherapy: Secondary | ICD-10-CM

## 2014-06-16 DIAGNOSIS — C563 Malignant neoplasm of bilateral ovaries: Secondary | ICD-10-CM

## 2014-06-16 DIAGNOSIS — R21 Rash and other nonspecific skin eruption: Secondary | ICD-10-CM

## 2014-06-16 DIAGNOSIS — K219 Gastro-esophageal reflux disease without esophagitis: Secondary | ICD-10-CM

## 2014-06-16 DIAGNOSIS — N135 Crossing vessel and stricture of ureter without hydronephrosis: Secondary | ICD-10-CM

## 2014-06-16 LAB — COMPREHENSIVE METABOLIC PANEL (CC13)
ALT: 16 U/L (ref 0–55)
ANION GAP: 11 meq/L (ref 3–11)
AST: 15 U/L (ref 5–34)
Albumin: 3.8 g/dL (ref 3.5–5.0)
Alkaline Phosphatase: 64 U/L (ref 40–150)
BILIRUBIN TOTAL: 0.78 mg/dL (ref 0.20–1.20)
BUN: 19.8 mg/dL (ref 7.0–26.0)
CALCIUM: 8.7 mg/dL (ref 8.4–10.4)
CHLORIDE: 106 meq/L (ref 98–109)
CO2: 22 meq/L (ref 22–29)
Creatinine: 1.1 mg/dL (ref 0.6–1.1)
EGFR: 64 mL/min/{1.73_m2} — ABNORMAL LOW (ref >90)
Glucose: 145 mg/dl — ABNORMAL HIGH (ref 70–140)
Potassium: 4.4 mEq/L (ref 3.5–5.1)
Sodium: 139 mEq/L (ref 136–145)
TOTAL PROTEIN: 6.6 g/dL (ref 6.4–8.3)

## 2014-06-16 LAB — CBC WITH DIFFERENTIAL/PLATELET
BASO%: 0.4 % (ref 0.0–2.0)
Basophils Absolute: 0 10*3/uL (ref 0.0–0.1)
EOS ABS: 0.1 10*3/uL (ref 0.0–0.5)
EOS%: 3.6 % (ref 0.0–7.0)
HCT: 30.5 % — ABNORMAL LOW (ref 34.8–46.6)
HGB: 10.4 g/dL — ABNORMAL LOW (ref 11.6–15.9)
LYMPH%: 26.9 % (ref 14.0–49.7)
MCH: 28.4 pg (ref 25.1–34.0)
MCHC: 34.1 g/dL (ref 31.5–36.0)
MCV: 83.3 fL (ref 79.5–101.0)
MONO#: 0 10*3/uL — ABNORMAL LOW (ref 0.1–0.9)
MONO%: 1.4 % (ref 0.0–14.0)
NEUT#: 1.9 10*3/uL (ref 1.5–6.5)
NEUT%: 67.7 % (ref 38.4–76.8)
NRBC: 0 % (ref 0–0)
PLATELETS: 296 10*3/uL (ref 145–400)
RBC: 3.66 10*6/uL — ABNORMAL LOW (ref 3.70–5.45)
RDW: 17.7 % — AB (ref 11.2–14.5)
WBC: 2.8 10*3/uL — ABNORMAL LOW (ref 3.9–10.3)
lymph#: 0.8 10*3/uL — ABNORMAL LOW (ref 0.9–3.3)

## 2014-06-16 MED ORDER — PEGFILGRASTIM INJECTION 6 MG/0.6ML ~~LOC~~
6.0000 mg | PREFILLED_SYRINGE | Freq: Once | SUBCUTANEOUS | Status: AC
  Administered 2014-06-16: 6 mg via SUBCUTANEOUS
  Filled 2014-06-16: qty 0.6

## 2014-06-16 NOTE — Progress Notes (Signed)
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 She was neutropenic by day 11 cycle 1, so will have first neulasta today (see below); cycle 2 was delayed a week due to [place] 1.1.  Patient's mother died prior to cycle 2 treatment.  Patient has been less uncomfortable thus far with cycle 2. She is using zofran q AM and ativan qhs, and has been able to keep down fluids. Bowels are moving. She had slight aching last pm. She is fatigued, but ambulatory at visit now. Peripheral IV access required 3 attempts per nursing documentation.  Patient had mammograms done [facility] on 06-07-14, with area questioned in right breast and follow up imaging scheduled for 07-04-14.     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   ONCOLOGIC HISTORY   Ovarian cancer, bilateral   03/23/2014 Initial Diagnosis Pelvic mass   03/31/2014 Surgery IIIA1(i) clear cell carcinoma of the ovary  Patient had been in usual good health, up to date on all medical care, when she developed bladder symptoms in early 2016. Urine culture showed no infection, then CT AP at [facility] 03-16-14 found perihepatic ascites and pelvic mass 9.9 x 20.1 x 17.8 cm which was cystic and solid, kidneys and ureters unremarkable, no pelvic fluid, rectosigmoid partially compressed, other organs not remarkable, no mention of adenopathy (report scanned into this EMR). Past gyn care has been by Dr Alden Hipp, however she was seen promptly by Dr Evie Lacks on referral from Dr Quillian Quince after CT resulted; CT findings confirmed by transvaginal US. CA  125 was 56 pre op. She saw Dr Alycia Rossetti in consultation on 03-23-14, then had surgery at [facility] on 03-31-14 which was TAH/BSO, omentectomy, pelvic and paraaortic node evaluation. Surgical findings were of bilateral complex ovarian masses and dense adhesions, with intra-operative spill of right ovarian mass as that was densely adherent to sigmoid serosa; posterior uterine segment also adherent to rectosigmoid, and retroperitoneal fibrosis bilaterally. Pathology [facility] [phone]) found grade 3 clear cell carcinoma involving both ovaries, largest 13 x 9.5 x 6.2 cm, + LVSI and one right pelvic node with microscopic involvement approximately 1 mm, ER PR negative, pT3a1 pN1 pMx. Cytology from ascites [facility] [phone]) was positive for adenocarcinoma of gyn primary. Multidisciplinary conference at [facility] 04-19-14 recommended carboplatin and taxane chemotherapy. Cycle 1 carbo taxol was given on 05-12-14, with neutropenia by day 11 (ANC 0.8) despite having started granix on day 8.        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      Review of systems as above, also: No fever or symptoms of infection. No SOB at rest, no chest pain. No abdominal or pelvic pain. Minimal peripheral neuropathy. No bleeding. No LE swelling Remainder of 10 point Review of Systems negative.  Objective:  Vital signs in last 24 hours:  BP 127/84 mmHg  Pulse 85  Temp(Src) 97.8 F (36.6 C) (Oral)  Resp 18  Ht '5\' 4"'  (1.626 m)  Wt 192 lb (87.091 kg)  BMI 32.94 kg/m2  SpO2 100% Weight stable.  Alert, oriented and appropriate. Ambulatory without difficulty. Looks somewhat tired but  NAD.   HEENT:PERRL, sclerae not icteric. Oral mucosa moist without lesions, posterior pharynx clear.  Neck supple. No JVD.  Lymphatics:no cervical,supraclavicular adenopathy Resp: clear to auscultation bilaterally and normal percussion bilaterally Cardio: regular rate and rhythm. No  gallop. GI: soft, nontender, not distended, no mass or organomegaly. A few bowel sounds. Surgical incision not remarkable. Musculoskeletal/ Extremities: without pitting edema, cords, tenderness Neuro: no significant peripheral neuropathy. Otherwise nonfocal. PSYCH appropriate mood and affect Skin without rash, ecchymosis, petechiae. No problems at sites of attempted IV access Breasts bilaterally without dominant mass, skin or nipple findings, including nothing palpable on right (see mammogram report). Axillae benign.   Lab Results:  Results for orders placed or performed in visit on 06/16/14  CBC with Differential  Result Value Ref Range   WBC 2.8 (L) 3.9 - 10.3 10e3/uL   NEUT# 1.9 1.5 - 6.5 10e3/uL   HGB 10.4 (L) 11.6 - 15.9 g/dL   HCT 30.5 (L) 34.8 - 46.6 %   Platelets 296 145 - 400 10e3/uL   MCV 83.3 79.5 - 101.0 fL   MCH 28.4 25.1 - 34.0 pg   MCHC 34.1 31.5 - 36.0 g/dL   RBC 3.66 (L) 3.70 - 5.45 10e6/uL   RDW 17.7 (H) 11.2 - 14.5 %   lymph# 0.8 (L) 0.9 - 3.3 10e3/uL   MONO# 0.0 (L) 0.1 - 0.9 10e3/uL   Eosinophils Absolute 0.1 0.0 - 0.5 10e3/uL   Basophils Absolute 0.0 0.0 - 0.1 10e3/uL   NEUT% 67.7 38.4 - 76.8 %   LYMPH% 26.9 14.0 - 49.7 %   MONO% 1.4 0.0 - 14.0 %   EOS% 3.6 0.0 - 7.0 %   BASO% 0.4 0.0 - 2.0 %   nRBC 0 0 - 0 %  Comprehensive metabolic panel (Cmet) - [facility]  Result Value Ref Range   Sodium 139 136 - 145 mEq/L   Potassium 4.4 3.5 - 5.1 mEq/L   Chloride 106 98 - 109 mEq/L   CO2 22 22 - 29 mEq/L   Glucose 145 (H) 70 - 140 mg/dl   BUN 19.8 7.0 - 26.0 mg/dL   Creatinine 1.1 0.6 - 1.1 mg/dL   Total Bilirubin 0.78 0.20 - 1.20 mg/dL   Alkaline Phosphatase 64 40 - 150 U/L   AST 15 5 - 34 U/L   ALT 16 0 - 55 U/L   Total Protein 6.6 6.4 - 8.3 g/dL   Albumin 3.8 3.5 - 5.0 g/dL   Calcium 8.7 8.4 - 10.4 mg/dL   Anion Gap 11 3 - 11 mEq/L   EGFR 64 (L) >90 ml/min/1.73 m2     Studies/Results:  EXAM: DIGITAL SCREENING BILATERAL MAMMOGRAM WITH CAD  06-05-14  [facility] COMPARISON: Previous exam(s).  ACR Breast Density Category b: There are scattered areas of fibroglandular density.  FINDINGS: In the right breast, a possible mass warrants further evaluation. In the left breast, no findings suspicious for malignancy. Images were processed with CAD.  IMPRESSION: Further evaluation is suggested for possible mass in the right breast.  RECOMMENDATION: Diagnostic mammogram and possibly ultrasound of the right breast.    Medications: I have reviewed the patient's current medications. First neulasta today  DISCUSSION: discussed options of granix vs neulasta. Patient understands that dosing of the neulasta may cause increased aches, but also may support WBC more adequately than the granix used with cycle 1.   Assessment/Plan:  1. High grade clear cell carcinoma of bilateral ovaries with microscopic involvement of one right pelvic node  at complete debulking surgery by Dr Alycia Rossetti at [facility] 03-31-2014, FIGO IIIA1 (i). Every 3 week carboplatin and taxol x 6 cycles begun 05-12-14. Neutropenic after cycle 1 and required one week delay for cycle 2. First neulasta today. I will see her with lab 5-31, cycle 3 due 06-30-14. Genetic variant of uncertain significance. 2.Retroperitoneal fibrosis and dense adhesions at surgery 3.history of HTN however BPs low at initiation of chemo and improved/ in good range off of Hyzaar now. Follow. 4.area questioned in right breast on screening mammograms 06-07-14, follow up imaging planned 5.previous fulguration of endometriosis, post C section 6.GERD controlled with prilosec 7.rash to sulfa 8.environmental allergies, previously used zyrtec and flonase, now using claritin due to taxol/gCSF aches. 9.Advance Directive information given 10. Mild renal insufficiency which preceded chemotherapy: tolerated AUC =6 with cycle 1, tho if creatinine higher would decrease this at least to AUC = 5. Push po fluids. Off Hyzaar now. Creatinine a  little better at 1.1 today 11.hypokalemia resolved off Hyzaar   All questions answered. Patient and husband know to call if concerns prior to next scheduled visit. Time spent 25 min including >50% counseling and coordination of care.    LIVESAY,[name], MD   06/16/2014, 1:17 PM

## 2014-06-16 NOTE — Telephone Encounter (Signed)
Per staff message and POF I have scheduled appts. Advised scheduler of appts. JMW  

## 2014-06-17 ENCOUNTER — Ambulatory Visit: Payer: Self-pay

## 2014-06-23 ENCOUNTER — Encounter: Payer: Self-pay | Admitting: Nutrition

## 2014-06-23 ENCOUNTER — Ambulatory Visit: Payer: Self-pay

## 2014-06-23 ENCOUNTER — Other Ambulatory Visit: Payer: Self-pay

## 2014-06-26 ENCOUNTER — Other Ambulatory Visit: Payer: Self-pay | Admitting: Oncology

## 2014-06-27 ENCOUNTER — Ambulatory Visit: Payer: 59 | Admitting: Nutrition

## 2014-06-27 ENCOUNTER — Other Ambulatory Visit (HOSPITAL_BASED_OUTPATIENT_CLINIC_OR_DEPARTMENT_OTHER): Payer: 59

## 2014-06-27 ENCOUNTER — Encounter: Payer: Self-pay | Admitting: Oncology

## 2014-06-27 ENCOUNTER — Telehealth: Payer: Self-pay | Admitting: Oncology

## 2014-06-27 ENCOUNTER — Ambulatory Visit (HOSPITAL_BASED_OUTPATIENT_CLINIC_OR_DEPARTMENT_OTHER): Payer: 59 | Admitting: Oncology

## 2014-06-27 VITALS — BP 144/71 | HR 80 | Temp 98.3°F | Resp 18 | Ht 64.0 in | Wt 194.9 lb

## 2014-06-27 DIAGNOSIS — C563 Malignant neoplasm of bilateral ovaries: Secondary | ICD-10-CM

## 2014-06-27 DIAGNOSIS — G620 Drug-induced polyneuropathy: Secondary | ICD-10-CM

## 2014-06-27 DIAGNOSIS — D6481 Anemia due to antineoplastic chemotherapy: Secondary | ICD-10-CM

## 2014-06-27 DIAGNOSIS — D509 Iron deficiency anemia, unspecified: Secondary | ICD-10-CM

## 2014-06-27 DIAGNOSIS — C562 Malignant neoplasm of left ovary: Secondary | ICD-10-CM | POA: Diagnosis not present

## 2014-06-27 DIAGNOSIS — I10 Essential (primary) hypertension: Secondary | ICD-10-CM

## 2014-06-27 DIAGNOSIS — T451X5A Adverse effect of antineoplastic and immunosuppressive drugs, initial encounter: Secondary | ICD-10-CM

## 2014-06-27 DIAGNOSIS — C561 Malignant neoplasm of right ovary: Secondary | ICD-10-CM | POA: Diagnosis not present

## 2014-06-27 DIAGNOSIS — Z803 Family history of malignant neoplasm of breast: Secondary | ICD-10-CM

## 2014-06-27 DIAGNOSIS — D701 Agranulocytosis secondary to cancer chemotherapy: Secondary | ICD-10-CM

## 2014-06-27 LAB — COMPREHENSIVE METABOLIC PANEL (CC13)
ALBUMIN: 3.6 g/dL (ref 3.5–5.0)
ALT: 15 U/L (ref 0–55)
AST: 15 U/L (ref 5–34)
Alkaline Phosphatase: 74 U/L (ref 40–150)
Anion Gap: 6 mEq/L (ref 3–11)
BILIRUBIN TOTAL: 0.28 mg/dL (ref 0.20–1.20)
BUN: 13.2 mg/dL (ref 7.0–26.0)
CALCIUM: 8.8 mg/dL (ref 8.4–10.4)
CO2: 25 mEq/L (ref 22–29)
Chloride: 109 mEq/L (ref 98–109)
Creatinine: 1 mg/dL (ref 0.6–1.1)
EGFR: 71 mL/min/{1.73_m2} — ABNORMAL LOW (ref >90)
GLUCOSE: 97 mg/dL (ref 70–140)
Potassium: 4.4 mEq/L (ref 3.5–5.1)
Sodium: 140 mEq/L (ref 136–145)
Total Protein: 6.5 g/dL (ref 6.4–8.3)

## 2014-06-27 LAB — CBC WITH DIFFERENTIAL/PLATELET
BASO%: 0.5 % (ref 0.0–2.0)
BASOS ABS: 0 10*3/uL (ref 0.0–0.1)
EOS ABS: 0.1 10*3/uL (ref 0.0–0.5)
EOS%: 1.1 % (ref 0.0–7.0)
HCT: 25.8 % — ABNORMAL LOW (ref 34.8–46.6)
HGB: 8.9 g/dL — ABNORMAL LOW (ref 11.6–15.9)
LYMPH%: 30.9 % (ref 14.0–49.7)
MCH: 29.2 pg (ref 25.1–34.0)
MCHC: 34.5 g/dL (ref 31.5–36.0)
MCV: 84.7 fL (ref 79.5–101.0)
MONO#: 0.6 10*3/uL (ref 0.1–0.9)
MONO%: 8.9 % (ref 0.0–14.0)
NEUT%: 58.6 % (ref 38.4–76.8)
NEUTROS ABS: 3.8 10*3/uL (ref 1.5–6.5)
PLATELETS: 216 10*3/uL (ref 145–400)
RBC: 3.05 10*6/uL — AB (ref 3.70–5.45)
RDW: 19.5 % — AB (ref 11.2–14.5)
WBC: 6.4 10*3/uL (ref 3.9–10.3)
lymph#: 2 10*3/uL (ref 0.9–3.3)

## 2014-06-27 MED ORDER — FERROUS FUMARATE 325 (106 FE) MG PO TABS
ORAL_TABLET | ORAL | Status: DC
Start: 2014-06-27 — End: 2015-10-10

## 2014-06-27 MED ORDER — DEXAMETHASONE 4 MG PO TABS: ORAL_TABLET | ORAL | Status: DC

## 2014-06-27 NOTE — Patient Instructions (Signed)
Check blood pressure in AMs before using Hyzaar. OK to take Hyzaar if BP >=110 for systolic (top number)

## 2014-06-27 NOTE — Telephone Encounter (Signed)
Appointments made and avs printed for patient °

## 2014-06-27 NOTE — Progress Notes (Signed)
54 year old female diagnosed with ovarian cancer. She is a patient of Dr. Marko Plume.   PMH includes hypertension, acid reflux.  Medications include zyrtec, decadron, colace, flonase, advil, immodium, ativan, MVI, prilosec, zofran, oxycodone.   Labs reviewed  Height: 64 inches Weight: 194 lbs Usual body weight: 180-185 lbs BMI 32.9  Estimated needs: 1650-1900 kcal 110-120 g protein 1.9 L fluid  Patient reports a varied appetite. Not eating well for the week after chemo (r/t nausea), but appetite returns before next treatment.  Complaints of weight gain even though she feels that she is not eating enough to gain weight. Of note, pt is on Decadron.   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 Uses lactaid at home.  Using Biotene for dry mouth which helps.  Some taste changes for chicken, but substitutes fish.  Pt with good protein intake at meals (eggs at breakfast, grilled chicken on a salad or [place] on a sandwich at lunch, and fish for dinner.) Pt has diarrhea for several days after treatment, but is resolved with imodium prescribed by physician.   Nutrition diagnosis:  Inadequate oral intake related to cancer and related treatments as evidenced by reported intake for several days to a week after chemo.   Intervention: Pt provided with high-calorie, high-protein smoothie recipes for days when she does not feel like eating. Emphasized importance of continuing good protein intake at meals and snacks.  Pt provided with samples of Boost Calorie Smart, Ensure Clear, and Resource Breeze. Advised to supplement po intake when poor (1-2 supplements per day depending on po intake.) Recommend continuing nausea and diarrhea medications as prescribed by physician.   Monitoring, evaluation and goals: Pt to continue good calorie and protein intake at meals and snacks (supplementing as needed) to minimize weight loss.  Laurette Schimke [place], [place], [place]

## 2014-06-27 NOTE — Progress Notes (Signed)
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 Cycle 1 was complicated by prolonged cytopenias despite granix x 7 doses; cycle 2 doses were reduced to AUC =5 and taxol at 135 mg/m2 with neulasta support.   Patient had little taxol aching cycle 2 and aches after day 5 neulasta were not severe. We have discussed option of On Pro neulasta day after chemo, which may be better for counts and may also be tolerated with this lesser taxol aching; she will discuss with infusion RN at treatment on 6-3, and fine to try that if she would like (otherwise will need neulasta at [facility] at least 06-03-14.  She returned to work last week, including several days from home. She was fatigued after office, but coworkers very helpful. She has been more constipated since using the ferrous sulfate, which she is taking on empty stomach with OJ, and will try other preparation as below. Bowels have moved with miralax. She has some tingling in feet, not bothersome, none in hands. BPs have been higher since off hyzaar, that when not taking po's well and lower BP after cycle 1 chemo.  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   ONCOLOGIC HISTORY   Ovarian cancer, bilateral   03/23/2014 Initial Diagnosis Pelvic mass   03/31/2014 Surgery IIIA1(i) clear cell carcinoma of the ovary  Patient had been in usual good health, up to date on all medical care, when she developed bladder symptoms in early 2016. Urine culture  showed no infection, then CT AP at [facility] 03-16-14 found perihepatic ascites and pelvic mass 9.9 x 20.1 x 17.8 cm which was cystic and solid, kidneys and ureters unremarkable, no pelvic fluid, rectosigmoid partially compressed, other organs not remarkable, no mention of adenopathy (report scanned into this EMR). Past gyn care has been by Dr Alden Hipp, however she was seen promptly by Dr Evie Lacks on referral from Dr Quillian Quince after CT resulted; CT findings confirmed by transvaginal US. CA 125 was 56 pre op. She saw Dr Alycia Rossetti in consultation on 03-23-14, then had surgery at [facility] on 03-31-14 which was TAH/BSO, omentectomy, pelvic and paraaortic node evaluation. Surgical findings were of bilateral complex ovarian masses and dense adhesions, with intra-operative spill of right ovarian mass as that was densely adherent to sigmoid serosa; posterior uterine segment also adherent to rectosigmoid, and retroperitoneal fibrosis bilaterally. Pathology [facility] [phone]) found grade 3 clear cell carcinoma involving both ovaries, largest 13 x 9.5 x 6.2 cm, + LVSI and one right pelvic node with microscopic involvement approximately 1 mm, ER PR negative, pT3a1 pN1 pMx. Cytology from ascites [facility] [phone]) was positive for adenocarcinoma of gyn primary. Multidisciplinary conference at [facility] 04-19-14 recommended carboplatin and taxane chemotherapy. Cycle 1 carbo taxol was given on 05-12-14, with neutropenia by day 11 (ANC 0.8) despite having started granix on day 8.                 Review of systems as above, also: No fever, no bleeding, not SOB walking in office today. No new or different  pain. Bladder ok. Minimal swelling in ankels bilaterally Remainder of 10 point Review of Systems negative.  Objective:  Vital signs in last 24 hours:  BP 144/71 mmHg  Pulse 80  Temp(Src) 98.3 F (36.8 C) (Oral)  Resp 18  Ht _0  (1.626 m)  Wt 194 lb 14.4 oz (88.406 kg)  BMI 33.44 kg/m2  SpO2 100% Weight up 3 lbs.Alert,  oriented and appropriate, looks comfortable, very pleasant.. Ambulatory without assistance  Alopecia  HEENT:PERRL, sclerae not icteric. Oral mucosa moist without lesions, posterior pharynx clear. Mucous membranes somewhat pale. Neck supple. No JVD.  Lymphatics:no cervical,supraclavicula or inguinal adenopathy Resp: clear to auscultation bilaterally and normal percussion bilaterally Cardio: regular rate and rhythm. No gallop. GI: soft, nontender, not distended, no mass or organomegaly. Normally active bowel sounds. Surgical incision not remarkable. Musculoskeletal/ Extremities: bilateral trace pedal edema, no cords or tenderness Neuro: slight peripheral neuropathy soles of feet as noted. Otherwise nonfocal. PSYCH appropriate mood and affect Skin without rash, ecchymosis, petechiae. Nails pale   Lab Results:  Results for orders placed or performed in visit on 06/27/14  CBC with Differential  Result Value Ref Range   WBC 6.4 3.9 - 10.3 10e3/uL   NEUT# 3.8 1.5 - 6.5 10e3/uL   HGB 8.9 (L) 11.6 - 15.9 g/dL   HCT 25.8 (L) 34.8 - 46.6 %   Platelets 216 145 - 400 10e3/uL   MCV 84.7 79.5 - 101.0 fL   MCH 29.2 25.1 - 34.0 pg   MCHC 34.5 31.5 - 36.0 g/dL   RBC 3.05 (L) 3.70 - 5.45 10e6/uL   RDW 19.5 (H) 11.2 - 14.5 %   lymph# 2.0 0.9 - 3.3 10e3/uL   MONO# 0.6 0.1 - 0.9 10e3/uL   Eosinophils Absolute 0.1 0.0 - 0.5 10e3/uL   Basophils Absolute 0.0 0.0 - 0.1 10e3/uL   NEUT% 58.6 38.4 - 76.8 %   LYMPH% 30.9 14.0 - 49.7 %   MONO% 8.9 0.0 - 14.0 %   EOS% 1.1 0.0 - 7.0 %   BASO% 0.5 0.0 - 2.0 %  Comprehensive metabolic panel (Cmet) - [facility]  Result Value Ref Range   Sodium 140 136 - 145 mEq/L   Potassium 4.4 3.5 - 5.1 mEq/L   Chloride 109 98 - 109 mEq/L   CO2 25 22 - 29 mEq/L   Glucose 97 70 - 140 mg/dl   BUN 13.2 7.0 - 26.0 mg/dL   Creatinine 1.0 0.6 - 1.1 mg/dL   Total Bilirubin 0.28 0.20 - 1.20 mg/dL   Alkaline Phosphatase 74 40 - 150 U/L   AST 15 5 - 34 U/L   ALT 15 0 - 55 U/L    Total Protein 6.5 6.4 - 8.3 g/dL   Albumin 3.6 3.5 - 5.0 g/dL   Calcium 8.8 8.4 - 10.4 mg/dL   Anion Gap 6 3 - 11 mEq/L   EGFR 71 (L) >90 ml/min/1.73 m2     Studies/Results:  No results found. Follow up right mammogram pending at [facility] 07-04-14  Medications: I have reviewed the patient's current medications. She will resume Hyzaar, following BP at home. She will try Hemocyte or ferrous fumarate instead of present ferrous sulfate, daily on empty stomach with OJ.  DISCUSSION: gCSF support with either On Pro injector for neulasta or neulasta at [facility] as above. WIll try other iron preparation to see if easier on GI tract. Other meds as noted, need to watch creatinine and K with resuming Hyzaar.  Chemo anemia: continue iron.  If drops <=8.0 or more symptomatic will need to consider PRBCs.  Assessment/Plan:  1. High grade clear cell carcinoma of bilateral ovaries with microscopic involvement of one right pelvic node at complete debulking surgery by Dr Alycia Rossetti at [facility] 03-31-2014, FIGO IIIA1 (i). Every 3 week carboplatin and taxol x 6 cycles begun 05-12-14. Neutropenic after cycle 1 and required one week delay for cycle 2, tolerated better overall with adjustments and neulasta. Counts ok today for cycle 3 on 6-3, but will recheck CBC then particularly due to drop in Hgb, and will get BMET due to resuming Hyzaar. Genetic variant of uncertain significance. 2.Retroperitoneal fibrosis and dense adhesions at surgery 3.history of HTN however BPs low at initiation of chemo but more elevated now and will resume Hyzaar. BMET on 6-3 as noted 4.area questioned in right breast on screening mammograms 06-07-14, follow up imaging planned 07-04-14 5.previous fulguration of endometriosis, post C section 6.GERD controlled with prilosec 7.rash to sulfa 8.environmental allergies, previously used zyrtec and flonase, now using claritin due to taxol/gCSF aches. 9.Advance Directive information given [email or web address] elevated  creatinine initially resolved when off hyzaar. Follow and will let PCP know by this note. 11.chemo anemia:iron as above, check iron studies with my next visit.  All questions answered and patient is in agreement with plans. Chemo orders confirmed with carbo at AUC=5 and taxol 135 mg/m2, note in RN instructions re On Pro (will need that order if so). Time spent 25 min including >50% counseling and coordination of care.      Jadalynn Burr P, MD   06/27/2014, 2:49 PM

## 2014-06-28 LAB — CA 125: CA 125: 5 U/mL (ref <35)

## 2014-06-30 ENCOUNTER — Other Ambulatory Visit (HOSPITAL_BASED_OUTPATIENT_CLINIC_OR_DEPARTMENT_OTHER): Payer: 59

## 2014-06-30 ENCOUNTER — Ambulatory Visit (HOSPITAL_BASED_OUTPATIENT_CLINIC_OR_DEPARTMENT_OTHER): Payer: 59

## 2014-06-30 VITALS — BP 126/74 | HR 88 | Temp 97.0°F | Resp 18

## 2014-06-30 DIAGNOSIS — C562 Malignant neoplasm of left ovary: Secondary | ICD-10-CM | POA: Diagnosis not present

## 2014-06-30 DIAGNOSIS — Z5189 Encounter for other specified aftercare: Secondary | ICD-10-CM

## 2014-06-30 DIAGNOSIS — D701 Agranulocytosis secondary to cancer chemotherapy: Secondary | ICD-10-CM

## 2014-06-30 DIAGNOSIS — C563 Malignant neoplasm of bilateral ovaries: Secondary | ICD-10-CM

## 2014-06-30 DIAGNOSIS — Z5111 Encounter for antineoplastic chemotherapy: Secondary | ICD-10-CM

## 2014-06-30 DIAGNOSIS — C561 Malignant neoplasm of right ovary: Secondary | ICD-10-CM

## 2014-06-30 DIAGNOSIS — D509 Iron deficiency anemia, unspecified: Secondary | ICD-10-CM

## 2014-06-30 DIAGNOSIS — T451X5A Adverse effect of antineoplastic and immunosuppressive drugs, initial encounter: Secondary | ICD-10-CM

## 2014-06-30 LAB — BASIC METABOLIC PANEL (CC13)
Anion Gap: 12 mEq/L — ABNORMAL HIGH (ref 3–11)
BUN: 19.5 mg/dL (ref 7.0–26.0)
CALCIUM: 10 mg/dL (ref 8.4–10.4)
CHLORIDE: 105 meq/L (ref 98–109)
CO2: 20 mEq/L — ABNORMAL LOW (ref 22–29)
CREATININE: 1.4 mg/dL — AB (ref 0.6–1.1)
EGFR: 49 mL/min/{1.73_m2} — ABNORMAL LOW (ref >90)
Glucose: 165 mg/dl — ABNORMAL HIGH (ref 70–140)
POTASSIUM: 4.2 meq/L (ref 3.5–5.1)
Sodium: 138 mEq/L (ref 136–145)

## 2014-06-30 LAB — CBC WITH DIFFERENTIAL/PLATELET
BASO%: 0 % (ref 0.0–2.0)
BASOS ABS: 0 10*3/uL (ref 0.0–0.1)
EOS%: 0 % (ref 0.0–7.0)
Eosinophils Absolute: 0 10*3/uL (ref 0.0–0.5)
HCT: 30.5 % — ABNORMAL LOW (ref 34.8–46.6)
HGB: 10.6 g/dL — ABNORMAL LOW (ref 11.6–15.9)
LYMPH%: 10.3 % — ABNORMAL LOW (ref 14.0–49.7)
MCH: 29 pg (ref 25.1–34.0)
MCHC: 34.8 g/dL (ref 31.5–36.0)
MCV: 83.6 fL (ref 79.5–101.0)
MONO#: 0 10*3/uL — ABNORMAL LOW (ref 0.1–0.9)
MONO%: 0.4 % (ref 0.0–14.0)
NEUT%: 89.3 % — ABNORMAL HIGH (ref 38.4–76.8)
NEUTROS ABS: 4.8 10*3/uL (ref 1.5–6.5)
Platelets: 303 10*3/uL (ref 145–400)
RBC: 3.65 10*6/uL — ABNORMAL LOW (ref 3.70–5.45)
RDW: 18.8 % — ABNORMAL HIGH (ref 11.2–14.5)
WBC: 5.3 10*3/uL (ref 3.9–10.3)
lymph#: 0.6 10*3/uL — ABNORMAL LOW (ref 0.9–3.3)

## 2014-06-30 MED ORDER — PEGFILGRASTIM 6 MG/0.6ML ~~LOC~~ PSKT
6.0000 mg | PREFILLED_SYRINGE | Freq: Once | SUBCUTANEOUS | Status: AC
  Administered 2014-06-30: 6 mg via SUBCUTANEOUS
  Filled 2014-06-30: qty 0.6

## 2014-06-30 MED ORDER — SODIUM CHLORIDE 0.9 % IV SOLN
441.0000 mg | Freq: Once | INTRAVENOUS | Status: AC
  Administered 2014-06-30: 440 mg via INTRAVENOUS
  Filled 2014-06-30: qty 44

## 2014-06-30 MED ORDER — SODIUM CHLORIDE 0.9 % IV SOLN
Freq: Once | INTRAVENOUS | Status: AC
  Administered 2014-06-30: 12:00:00 via INTRAVENOUS

## 2014-06-30 MED ORDER — PACLITAXEL CHEMO INJECTION 300 MG/50ML
135.0000 mg/m2 | Freq: Once | INTRAVENOUS | Status: AC
  Administered 2014-06-30: 270 mg via INTRAVENOUS
  Filled 2014-06-30: qty 45

## 2014-06-30 MED ORDER — FAMOTIDINE IN NACL 20-0.9 MG/50ML-% IV SOLN
20.0000 mg | Freq: Once | INTRAVENOUS | Status: AC
Start: 2014-06-30 — End: 2014-06-30
  Administered 2014-06-30: 20 mg via INTRAVENOUS

## 2014-06-30 MED ORDER — FAMOTIDINE IN NACL 20-0.9 MG/50ML-% IV SOLN
INTRAVENOUS | Status: AC
  Filled 2014-06-30: qty 50

## 2014-06-30 MED ORDER — DIPHENHYDRAMINE HCL 50 MG/ML IJ SOLN
50.0000 mg | Freq: Once | INTRAMUSCULAR | Status: AC
  Administered 2014-06-30: 50 mg via INTRAVENOUS

## 2014-06-30 MED ORDER — SODIUM CHLORIDE 0.9 % IV SOLN
Freq: Once | INTRAVENOUS | Status: AC
  Administered 2014-06-30: 12:00:00 via INTRAVENOUS
  Filled 2014-06-30: qty 8

## 2014-06-30 MED ORDER — DIPHENHYDRAMINE HCL 50 MG/ML IJ SOLN
INTRAMUSCULAR | Status: AC
  Filled 2014-06-30: qty 1

## 2014-06-30 NOTE — Patient Instructions (Signed)
[facility] Discharge Instructions for Patients Receiving Chemotherapy  Today you received the following chemotherapy agents: Taxol, Carboplatin   To help prevent nausea and vomiting after your treatment, we encourage you to take your nausea medication as directed.    If you develop nausea and vomiting that is not controlled by your nausea medication, call the clinic.   BELOW ARE SYMPTOMS THAT SHOULD BE REPORTED IMMEDIATELY:  *FEVER GREATER THAN 100.5 F  *CHILLS WITH OR WITHOUT FEVER  NAUSEA AND VOMITING THAT IS NOT CONTROLLED WITH YOUR NAUSEA MEDICATION  *UNUSUAL SHORTNESS OF BREATH  *UNUSUAL BRUISING OR BLEEDING  TENDERNESS IN MOUTH AND THROAT WITH OR WITHOUT PRESENCE OF ULCERS  *URINARY PROBLEMS  *BOWEL PROBLEMS  UNUSUAL RASH Items with * indicate a potential emergency and should be followed up as soon as possible.  Feel free to call the clinic you have any questions or concerns. The clinic phone number is [phone].  Please show the CHEMO ALERT CARD at check-in to the Emergency Department and triage nurse.   

## 2014-07-03 ENCOUNTER — Ambulatory Visit: Payer: Self-pay | Admitting: Oncology

## 2014-07-03 ENCOUNTER — Other Ambulatory Visit: Payer: Self-pay

## 2014-07-04 ENCOUNTER — Ambulatory Visit: Payer: Self-pay

## 2014-07-04 ENCOUNTER — Other Ambulatory Visit: Payer: Self-pay | Admitting: Gynecologic Oncology

## 2014-07-04 ENCOUNTER — Ambulatory Visit (HOSPITAL_COMMUNITY)
Admission: RE | Admit: 2014-07-04 | Discharge: 2014-07-04 | Disposition: A | Discharge status: Home / Self Care | Payer: 59 | Source: Ambulatory Visit | Attending: Gynecologic Oncology | Admitting: Gynecologic Oncology

## 2014-07-04 DIAGNOSIS — N63 Unspecified lump in breast: Secondary | ICD-10-CM | POA: Insufficient documentation

## 2014-07-04 DIAGNOSIS — C569 Malignant neoplasm of unspecified ovary: Secondary | ICD-10-CM | POA: Insufficient documentation

## 2014-07-04 DIAGNOSIS — Z79899 Other long term (current) drug therapy: Secondary | ICD-10-CM | POA: Diagnosis not present

## 2014-07-04 DIAGNOSIS — N6310 Unspecified lump in the right breast, unspecified quadrant: Secondary | ICD-10-CM

## 2014-07-09 ENCOUNTER — Other Ambulatory Visit: Payer: Self-pay | Admitting: Oncology

## 2014-07-10 ENCOUNTER — Other Ambulatory Visit (HOSPITAL_BASED_OUTPATIENT_CLINIC_OR_DEPARTMENT_OTHER): Payer: 59

## 2014-07-10 ENCOUNTER — Ambulatory Visit (HOSPITAL_BASED_OUTPATIENT_CLINIC_OR_DEPARTMENT_OTHER): Payer: 59 | Admitting: Oncology

## 2014-07-10 ENCOUNTER — Telehealth: Payer: Self-pay | Admitting: Oncology

## 2014-07-10 VITALS — BP 136/74 | HR 74 | Temp 98.0°F | Resp 18 | Ht 64.0 in | Wt 195.7 lb

## 2014-07-10 DIAGNOSIS — C561 Malignant neoplasm of right ovary: Secondary | ICD-10-CM

## 2014-07-10 DIAGNOSIS — D701 Agranulocytosis secondary to cancer chemotherapy: Secondary | ICD-10-CM | POA: Diagnosis not present

## 2014-07-10 DIAGNOSIS — D500 Iron deficiency anemia secondary to blood loss (chronic): Secondary | ICD-10-CM

## 2014-07-10 DIAGNOSIS — C562 Malignant neoplasm of left ovary: Secondary | ICD-10-CM | POA: Diagnosis not present

## 2014-07-10 DIAGNOSIS — T451X5A Adverse effect of antineoplastic and immunosuppressive drugs, initial encounter: Secondary | ICD-10-CM

## 2014-07-10 DIAGNOSIS — C563 Malignant neoplasm of bilateral ovaries: Secondary | ICD-10-CM

## 2014-07-10 DIAGNOSIS — D6481 Anemia due to antineoplastic chemotherapy: Secondary | ICD-10-CM | POA: Diagnosis not present

## 2014-07-10 DIAGNOSIS — E860 Dehydration: Secondary | ICD-10-CM

## 2014-07-10 LAB — CBC WITH DIFFERENTIAL/PLATELET
BASO%: 0.2 % (ref 0.0–2.0)
Basophils Absolute: 0 10*3/uL (ref 0.0–0.1)
EOS%: 0.4 % (ref 0.0–7.0)
Eosinophils Absolute: 0.1 10*3/uL (ref 0.0–0.5)
HCT: 24.7 % — ABNORMAL LOW (ref 34.8–46.6)
HGB: 8.5 g/dL — ABNORMAL LOW (ref 11.6–15.9)
LYMPH#: 3.1 10*3/uL (ref 0.9–3.3)
LYMPH%: 21.2 % (ref 14.0–49.7)
MCH: 29.6 pg (ref 25.1–34.0)
MCHC: 34.4 g/dL (ref 31.5–36.0)
MCV: 86.1 fL (ref 79.5–101.0)
MONO#: 1.7 10*3/uL — AB (ref 0.1–0.9)
MONO%: 11.8 % (ref 0.0–14.0)
NEUT#: 9.5 10*3/uL — ABNORMAL HIGH (ref 1.5–6.5)
NEUT%: 66.4 % (ref 38.4–76.8)
Platelets: 249 10*3/uL (ref 145–400)
RBC: 2.87 10*6/uL — AB (ref 3.70–5.45)
RDW: 18.8 % — ABNORMAL HIGH (ref 11.2–14.5)
WBC: 14.4 10*3/uL — AB (ref 3.9–10.3)

## 2014-07-10 LAB — COMPREHENSIVE METABOLIC PANEL (CC13)
ALT: 16 U/L (ref 0–55)
AST: 14 U/L (ref 5–34)
Albumin: 3.6 g/dL (ref 3.5–5.0)
Alkaline Phosphatase: 94 U/L (ref 40–150)
Anion Gap: 8 mEq/L (ref 3–11)
BUN: 13.1 mg/dL (ref 7.0–26.0)
CO2: 24 meq/L (ref 22–29)
Calcium: 8.8 mg/dL (ref 8.4–10.4)
Chloride: 109 mEq/L (ref 98–109)
Creatinine: 1 mg/dL (ref 0.6–1.1)
EGFR: 75 mL/min/{1.73_m2} — ABNORMAL LOW (ref >90)
GLUCOSE: 87 mg/dL (ref 70–140)
POTASSIUM: 4.1 meq/L (ref 3.5–5.1)
Sodium: 140 mEq/L (ref 136–145)
Total Bilirubin: <0.2 mg/dL (ref 0.20–1.20)
Total Protein: 6.4 g/dL (ref 6.4–8.3)

## 2014-07-10 NOTE — Telephone Encounter (Signed)
Gave avs & calendar for June/July.  °

## 2014-07-12 ENCOUNTER — Encounter: Payer: Self-pay | Admitting: Oncology

## 2014-07-12 ENCOUNTER — Telehealth: Payer: Self-pay

## 2014-07-12 NOTE — Progress Notes (Signed)
OFFICE PROGRESS NOTE  July 10, 2014  Physicians:Paola Elon Alas, MD (PCP), Alden Hipp), [facility])                         Outside records from [facility] Dr Gar Ponto reviewed by MD now.  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 She was hospitalized at [facility] 6-5 thru 07-03-14 following syncopal episode at church, felt to be dehydration related.   Patient had no acute problems with chemo administration and tolerated the On Pro neulasta injection with no aching. She felt generally weak and had some nausea on 07-02-14 prior to going to church, then had syncopal episode at end of the service. No seizure activity was noted, however she was incontinent of urine when EMS got her up. She was orthostatic in ED, with heart rate increasing from 89 supine to 119 standing; BUN 26 and creatinine 1.55, potassium 3.4;head CT negative; CXR negative.  She received IVF overnight, with hemoglobin dropping from 10 on admission to 9.3, WBC post neulasta 35.6, platelets 236k, BUN 21 and creatine 1.24.  Since hospital discharge she has had no further orthostatic or syncopal symptoms and no other urinary incontinence. Nausea is improved and she is eating and drinking fluids. Bowels are moving. She has had no bleeding. She has slight tingling in toes only, not bothersome. She denies SOB walking in office now. She has had no bleeding.    She had follow up right mammogram and [place] at [facility] on 07-04-14, read by Dr Nolon Nations, with superficial density lateral right breast / 2 small irregular hypoechoic nodules at 10:00. Biopsy is to be done at [facility] on 07-25-14. Reports below.   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  She has been working mostly from home this week.   ONCOLOGIC HISTORY Patient had been in usual good health, up to date on all medical care, when she developed bladder symptoms in early 2016. Urine culture showed no infection, then CT AP at [facility] 03-16-14 found perihepatic ascites and pelvic mass 9.9 x 20.1 x 17.8 cm which was cystic and solid, kidneys and ureters unremarkable, no pelvic fluid, rectosigmoid partially compressed, other organs not remarkable, no mention of adenopathy (report scanned into this EMR). Past gyn care has been by Dr Alden Hipp, however she was seen promptly by Dr Evie Lacks on referral from Dr Quillian Quince after CT resulted; CT findings confirmed by transvaginal US. CA 125 was 56 pre op. She saw Dr Alycia Rossetti in consultation on 03-23-14, then had surgery at [facility] on 03-31-14 which was TAH/BSO, omentectomy, pelvic and paraaortic node evaluation. Surgical findings were of bilateral complex ovarian masses and dense adhesions, with intra-operative spill of right ovarian mass as that was densely adherent to sigmoid serosa; posterior uterine segment also adherent to rectosigmoid, and retroperitoneal fibrosis bilaterally. Pathology [facility] [phone]) found grade 3 clear cell carcinoma involving both ovaries, largest 13 x 9.5 x 6.2 cm, + LVSI and one right pelvic node with microscopic involvement approximately 1 mm, ER PR negative, pT3a1 pN1 pMx. Cytology from ascites [facility] [phone]) was positive for adenocarcinoma of gyn primary. Multidisciplinary conference at [facility] 04-19-14 recommended carboplatin  and taxane chemotherapy. Cycle 1 carbo taxol was given on 05-12-14, with neutropenia by day 11 (ANC 0.8) despite having started granix on day 8.She was hospitalized at [facility] after cycle 3 with syncope related to dehydration.      Review of systems as above, also: No fever or symptoms of infection. No abdominal or pelvic pain. No LE  swelling.  Remainder of 10 point Review of Systems negative.  Objective:  Vital signs in last 24 hours:  BP 136/74 mmHg  Pulse 74  Temp(Src) 98 F (36.7 C) (Oral)  Resp 18  Ht _0  (1.626 m)  Wt 195 lb 11.2 oz (88.769 kg)  BMI 33.58 kg/m2 Weight is up 1 lb. Alert, oriented and appropriate. Ambulatory without difficulty. Respirations not labored RA. Alopecia  HEENT:PERRL, sclerae not icteric. Oral mucosa moist without lesions, posterior pharynx clear.  Neck supple. No JVD.  Lymphatics:no cervical,supraclavicular or inguinal adenopathy Resp: clear to auscultation bilaterally and normal percussion bilaterally Cardio: regular rate and rhythm. No gallop. GI: soft, nontender, not distended, no mass or organomegaly. Normally active bowel sounds. Surgical incision not remarkable. Musculoskeletal/ Extremities: without pitting edema, cords, tenderness Neuro: slight peripheral neuropathy toes as noted. Otherwise nonfocal. Psych appropriate mood and affect Skin without rash, ecchymosis, petechiae Breast exam not repeated today, nothing palpable on my exam after the initial mammograms.   Lab Results:  Results for orders placed or performed in visit on 07/10/14  CBC with Differential  Result Value Ref Range   WBC 14.4 (H) 3.9 - 10.3 10e3/uL   NEUT# 9.5 (H) 1.5 - 6.5 10e3/uL   HGB 8.5 (L) 11.6 - 15.9 g/dL   HCT 24.7 (L) 34.8 - 46.6 %   Platelets 249 145 - 400 10e3/uL   MCV 86.1 79.5 - 101.0 fL   MCH 29.6 25.1 - 34.0 pg   MCHC 34.4 31.5 - 36.0 g/dL   RBC 2.87 (L) 3.70 - 5.45 10e6/uL   RDW 18.8 (H) 11.2 - 14.5 %   lymph# 3.1 0.9 - 3.3 10e3/uL   MONO# 1.7 (H) 0.1 - 0.9 10e3/uL   Eosinophils Absolute 0.1 0.0 - 0.5 10e3/uL   Basophils Absolute 0.0 0.0 - 0.1 10e3/uL   NEUT% 66.4 38.4 - 76.8 %   LYMPH% 21.2 14.0 - 49.7 %   MONO% 11.8 0.0 - 14.0 %   EOS% 0.4 0.0 - 7.0 %   BASO% 0.2 0.0 - 2.0 %  Comprehensive metabolic panel (Cmet) - [facility]  Result Value Ref Range   Sodium 140 136 - 145  mEq/L   Potassium 4.1 3.5 - 5.1 mEq/L   Chloride 109 98 - 109 mEq/L   CO2 24 22 - 29 mEq/L   Glucose 87 70 - 140 mg/dl   BUN 13.1 7.0 - 26.0 mg/dL   Creatinine 1.0 0.6 - 1.1 mg/dL   Total Bilirubin <0.20 0.20 - 1.20 mg/dL   Alkaline Phosphatase 94 40 - 150 U/L   AST 14 5 - 34 U/L   ALT 16 0 - 55 U/L   Total Protein 6.4 6.4 - 8.3 g/dL   Albumin 3.6 3.5 - 5.0 g/dL   Calcium 8.8 8.4 - 10.4 mg/dL   Anion Gap 8 3 - 11 mEq/L   EGFR 75 (L) >90 ml/min/1.73 m2    CA 125 on 06-27-14    5  Iron, TIBC and ferritin ordered to be done with labs on 07-20-14.   Studies/Results:  DIGITAL DIAGNOSTIC RIGHT MAMMOGRAM WITH 3D TOMOSYNTHESIS WITH CAD  07-04-14  ULTRASOUND RIGHT BREAST  COMPARISON: 06/05/2014  ACR Breast Density Category b: There are scattered areas of fibroglandular density.  FINDINGS: Additional tomographic images are performed, confirming presence of a superficial density in the lateral aspect of the right breast. Density in the medial aspect of the left breast appears benign based on tomographic images.  Mammographic images were processed with CAD.  On physical exam, I palpate no abnormality in the lateral aspect of the right breast.  Targeted ultrasound is performed, showing adjacent hypoechoic nodules with hyperechoic rim is in the 10 o'clock location of the right breast 3 cm from the nipple. These measure 0.5 x 0.3 x 0.4 cm and 0.6 x 0.4 x 0.3 cm. A superficial intramammary lymph node is identified in the 10 o'clock location 8 cm from the nipple, correlating well with the a stable mammographic finding. Evaluation of the right axilla is negative for adenopathy.  IMPRESSION: 1. Two small adjacent hypoechoic irregular nodules in the 10 o'clock location of the right breast warranting biopsy to exclude malignancy. 2. No evidence for adenopathy.  RECOMMENDATION: Ultrasound-guided core biopsy is recommended and scheduled for the patient.  I have discussed  the findings and recommendations with the patient. Results were also provided in writing at the conclusion of the visit. If applicable, a reminder letter will be sent to the patient regarding the next appointment.  BI-RADS CATEGORY 4: Suspicious.     CLINICAL DATA: The patient returns after screening study for evaluation of the right breast. Patient has been recently diagnosed with ovarian cancer and is undergoing chemotherapy.  EXAM: DIGITAL DIAGNOSTIC RIGHT MAMMOGRAM WITH 3D TOMOSYNTHESIS WITH CAD  07-04-14 ULTRASOUND RIGHT BREAST  COMPARISON: 06/05/2014  ACR Breast Density Category b: There are scattered areas of fibroglandular density.  FINDINGS: Additional tomographic images are performed, confirming presence of a superficial density in the lateral aspect of the right breast. Density in the medial aspect of the left breast appears benign based on tomographic images.  Mammographic images were processed with CAD.  On physical exam, I palpate no abnormality in the lateral aspect of the right breast.  Targeted ultrasound is performed, showing adjacent hypoechoic nodules with hyperechoic rim is in the 10 o'clock location of the right breast 3 cm from the nipple. These measure 0.5 x 0.3 x 0.4 cm and 0.6 x 0.4 x 0.3 cm. A superficial intramammary lymph node is identified in the 10 o'clock location 8 cm from the nipple, correlating well with the a stable mammographic finding. Evaluation of the right axilla is negative for adenopathy.  IMPRESSION: 1. Two small adjacent hypoechoic irregular nodules in the 10 o'clock location of the right breast warranting biopsy to exclude malignancy. 2. No evidence for adenopathy.  RECOMMENDATION: Ultrasound-guided core biopsy is recommended and scheduled for the patient.  I have discussed the findings and recommendations with the patient. Results were also provided in writing at the conclusion of the visit. If  applicable, a reminder letter will be sent to the patient regarding the next appointment.  BI-RADS CATEGORY 4: Suspicious    Medications: I have reviewed the patient's current medications. She has been on oral iron since 06-27-14. Per Dr Olena Heckle, no antihypertensives unless systolic >371, and he will follow up also in office.  DISCUSSION:  Events since most recent chemotherapy discussed; will add IVF on day 2 of next and subsequent chemo cycles. Continue neulasta on day 2 with On Pro injector (as insurance restrictions require). Will check labs on 6-23 and treat with cycle 4 on 6-24  as long as ANC >= 1.5 and plt >=100k and renal function appropriate.  She understands that she may need IV iron and may need PRBCs if hgb <~ 8.0 or more symptomatic.  Findings on follow up imaging of right breast and planned breast biopsy discussed, and certainly we will follow up that information also.    Assessment/Plan:  1. High grade clear cell carcinoma of bilateral ovaries with microscopic involvement of one right pelvic node at complete debulking surgery by Dr Alycia Rossetti at [facility] 03-31-2014, FIGO IIIA1 (i). Every 3 week carboplatin and taxol x 6 cycles begun 05-12-14. Neulasta by On Pro injector helpful, needs IVF day 2. Cycle 4 on 07-21-14 if labs ok day prior.  2.Retroperitoneal fibrosis and dense adhesions at surgery 3.some HTN, however BP low again after last chemo: agree with dosing as noted by Dr Quillian Quince 4.Area of concern right breast, for biopsy at [facility] on 07-25-14 5.previous fulguration of endometriosis, post C section 6.GERD controlled with prilosec 7.rash to sulfa 8.environmental allergies, previously used zyrtec and flonase, now using claritin due to taxol/gCSF aches. 9.Advance Directive information given 10.Creatinine more elevated with hyzaar and with dehydration as noted, improved at 1.0 today. 11.anemia: with chemo, also check iron studies. As above  All questions answered and patient is in  agreement with recommendations and plans as noted. She will call if any concerns prior to next visit. Chemo, neulasta and IVF orders entered. Time spent  30 min including >50% counseling and coordination of care.    Gordy Levan, MD

## 2014-07-12 NOTE — Telephone Encounter (Addendum)
Did not reach Audrey Porter to discuss adding IVF to 07-22-14 as noted below by  Dr. Marko Plume.  Will Try again later to reach patient.

## 2014-07-12 NOTE — Telephone Encounter (Signed)
-----   Message from Gordy Levan, MD sent at 07/12/2014  2:15 PM EDT ----- Hospitalized day 3 last chemo with syncope/ dehydration Next chemo Friday 6-24 Needs IVF on Sat 6-25. I have put orders in but have no done a  POF so RN can talk with her first. Please tell her that I should have thought of this when I saw her, but did not set it up then. If she agrees, please put in POF or set it up  Thanks!

## 2014-07-14 ENCOUNTER — Other Ambulatory Visit: Payer: Self-pay | Admitting: Oncology

## 2014-07-14 NOTE — Telephone Encounter (Addendum)
Spoke with Ms. Chap and she is agreeable to having IVF on Sat 07-22-14  after her treatment 07-21-14. POF sent to scheduling to set up appointment.

## 2014-07-20 ENCOUNTER — Other Ambulatory Visit (HOSPITAL_BASED_OUTPATIENT_CLINIC_OR_DEPARTMENT_OTHER): Payer: 59

## 2014-07-20 ENCOUNTER — Telehealth: Payer: Self-pay | Admitting: Nurse Practitioner

## 2014-07-20 ENCOUNTER — Other Ambulatory Visit: Payer: Self-pay | Admitting: Oncology

## 2014-07-20 DIAGNOSIS — C562 Malignant neoplasm of left ovary: Principal | ICD-10-CM

## 2014-07-20 DIAGNOSIS — C561 Malignant neoplasm of right ovary: Secondary | ICD-10-CM

## 2014-07-20 DIAGNOSIS — D6481 Anemia due to antineoplastic chemotherapy: Secondary | ICD-10-CM

## 2014-07-20 DIAGNOSIS — D500 Iron deficiency anemia secondary to blood loss (chronic): Secondary | ICD-10-CM

## 2014-07-20 DIAGNOSIS — C563 Malignant neoplasm of bilateral ovaries: Secondary | ICD-10-CM

## 2014-07-20 LAB — COMPREHENSIVE METABOLIC PANEL (CC13)
ALT: 10 U/L (ref 0–55)
ANION GAP: 7 meq/L (ref 3–11)
AST: 12 U/L (ref 5–34)
Albumin: 3.8 g/dL (ref 3.5–5.0)
Alkaline Phosphatase: 69 U/L (ref 40–150)
BUN: 19.9 mg/dL (ref 7.0–26.0)
CALCIUM: 9.2 mg/dL (ref 8.4–10.4)
CHLORIDE: 107 meq/L (ref 98–109)
CO2: 27 meq/L (ref 22–29)
CREATININE: 1.2 mg/dL — AB (ref 0.6–1.1)
EGFR: 62 mL/min/{1.73_m2} — ABNORMAL LOW (ref >90)
Glucose: 87 mg/dl (ref 70–140)
Potassium: 4.2 mEq/L (ref 3.5–5.1)
Sodium: 141 mEq/L (ref 136–145)
Total Bilirubin: 0.46 mg/dL (ref 0.20–1.20)
Total Protein: 6.6 g/dL (ref 6.4–8.3)

## 2014-07-20 LAB — CBC WITH DIFFERENTIAL/PLATELET
BASO%: 0.2 % (ref 0.0–2.0)
Basophils Absolute: 0 10*3/uL (ref 0.0–0.1)
EOS%: 1 % (ref 0.0–7.0)
Eosinophils Absolute: 0 10*3/uL (ref 0.0–0.5)
HEMATOCRIT: 25.5 % — AB (ref 34.8–46.6)
HGB: 8.6 g/dL — ABNORMAL LOW (ref 11.6–15.9)
LYMPH%: 42.6 % (ref 14.0–49.7)
MCH: 29.6 pg (ref 25.1–34.0)
MCHC: 33.7 g/dL (ref 31.5–36.0)
MCV: 87.6 fL (ref 79.5–101.0)
MONO#: 0.5 10*3/uL (ref 0.1–0.9)
MONO%: 13.2 % (ref 0.0–14.0)
NEUT#: 1.8 10*3/uL (ref 1.5–6.5)
NEUT%: 43 % (ref 38.4–76.8)
Platelets: 153 10*3/uL (ref 145–400)
RBC: 2.91 10*6/uL — AB (ref 3.70–5.45)
RDW: 21 % — ABNORMAL HIGH (ref 11.2–14.5)
WBC: 4.1 10*3/uL (ref 3.9–10.3)
lymph#: 1.7 10*3/uL (ref 0.9–3.3)

## 2014-07-20 LAB — IRON AND TIBC CHCC
%SAT: 30 % (ref 21–57)
IRON: 93 ug/dL (ref 41–142)
TIBC: 311 ug/dL (ref 236–444)
UIBC: 219 ug/dL (ref 120–384)

## 2014-07-20 LAB — FERRITIN CHCC: Ferritin: 144 ng/ml (ref 9–269)

## 2014-07-20 NOTE — Telephone Encounter (Signed)
RN called patient to check in and assess based on labs results. Hgb 8.6. Patient states she is feeling "fine," she is at work, and denies shortness of breath or increased fatigue. She states "the lab told me my numbers were good." CBC and Cmet WNL per treatment parameters. Dr. Marko Plume to review labs. Patient plans to arrive for treatment tomorrow unless otherwise informed. She is aware to call clinic with any changes or concerns.

## 2014-07-21 ENCOUNTER — Other Ambulatory Visit: Payer: Self-pay

## 2014-07-21 ENCOUNTER — Ambulatory Visit (HOSPITAL_BASED_OUTPATIENT_CLINIC_OR_DEPARTMENT_OTHER): Payer: 59

## 2014-07-21 VITALS — BP 137/73 | HR 92 | Temp 97.8°F | Resp 16

## 2014-07-21 DIAGNOSIS — Z5189 Encounter for other specified aftercare: Secondary | ICD-10-CM | POA: Diagnosis not present

## 2014-07-21 DIAGNOSIS — Z5111 Encounter for antineoplastic chemotherapy: Secondary | ICD-10-CM

## 2014-07-21 DIAGNOSIS — C562 Malignant neoplasm of left ovary: Secondary | ICD-10-CM | POA: Diagnosis not present

## 2014-07-21 DIAGNOSIS — C563 Malignant neoplasm of bilateral ovaries: Secondary | ICD-10-CM

## 2014-07-21 DIAGNOSIS — C561 Malignant neoplasm of right ovary: Secondary | ICD-10-CM

## 2014-07-21 MED ORDER — DIPHENHYDRAMINE HCL 50 MG/ML IJ SOLN
50.0000 mg | Freq: Once | INTRAMUSCULAR | Status: AC
  Administered 2014-07-21: 50 mg via INTRAVENOUS

## 2014-07-21 MED ORDER — FAMOTIDINE IN NACL 20-0.9 MG/50ML-% IV SOLN
INTRAVENOUS | Status: AC
  Filled 2014-07-21: qty 50

## 2014-07-21 MED ORDER — DIPHENHYDRAMINE HCL 50 MG/ML IJ SOLN
INTRAMUSCULAR | Status: AC
  Filled 2014-07-21: qty 1

## 2014-07-21 MED ORDER — SODIUM CHLORIDE 0.9 % IV SOLN
493.5000 mg | Freq: Once | INTRAVENOUS | Status: AC
  Administered 2014-07-21: 490 mg via INTRAVENOUS
  Filled 2014-07-21: qty 49

## 2014-07-21 MED ORDER — PEGFILGRASTIM 6 MG/0.6ML ~~LOC~~ PSKT
6.0000 mg | PREFILLED_SYRINGE | Freq: Once | SUBCUTANEOUS | Status: AC
  Administered 2014-07-21: 6 mg via SUBCUTANEOUS
  Filled 2014-07-21: qty 0.6

## 2014-07-21 MED ORDER — PACLITAXEL CHEMO INJECTION 300 MG/50ML
135.0000 mg/m2 | Freq: Once | INTRAVENOUS | Status: AC
  Administered 2014-07-21: 270 mg via INTRAVENOUS
  Filled 2014-07-21: qty 45

## 2014-07-21 MED ORDER — FAMOTIDINE IN NACL 20-0.9 MG/50ML-% IV SOLN
20.0000 mg | Freq: Once | INTRAVENOUS | Status: AC
  Administered 2014-07-21: 20 mg via INTRAVENOUS

## 2014-07-21 MED ORDER — SODIUM CHLORIDE 0.9 % IV SOLN
250.0000 mL | Freq: Once | INTRAVENOUS | Status: AC
  Administered 2014-07-21: 250 mL via INTRAVENOUS

## 2014-07-21 MED ORDER — SODIUM CHLORIDE 0.9 % IV SOLN
Freq: Once | INTRAVENOUS | Status: AC
  Administered 2014-07-21: 13:00:00 via INTRAVENOUS
  Filled 2014-07-21: qty 8

## 2014-07-21 NOTE — Progress Notes (Signed)
During end of benadryl IV push, pt. C/o burning at IV site.  Benadryl IV push stopped.  Unable to flush with saline.   IV d/c'd.

## 2014-07-21 NOTE — Patient Instructions (Signed)
[facility] Discharge Instructions for Patients Receiving Chemotherapy  Today you received the following chemotherapy agents: Taxol, Carboplatin   To help prevent nausea and vomiting after your treatment, we encourage you to take your nausea medication as directed.    If you develop nausea and vomiting that is not controlled by your nausea medication, call the clinic.   BELOW ARE SYMPTOMS THAT SHOULD BE REPORTED IMMEDIATELY:  *FEVER GREATER THAN 100.5 F  *CHILLS WITH OR WITHOUT FEVER  NAUSEA AND VOMITING THAT IS NOT CONTROLLED WITH YOUR NAUSEA MEDICATION  *UNUSUAL SHORTNESS OF BREATH  *UNUSUAL BRUISING OR BLEEDING  TENDERNESS IN MOUTH AND THROAT WITH OR WITHOUT PRESENCE OF ULCERS  *URINARY PROBLEMS  *BOWEL PROBLEMS  UNUSUAL RASH Items with * indicate a potential emergency and should be followed up as soon as possible.  Feel free to call the clinic you have any questions or concerns. The clinic phone number is [phone].  Please show the CHEMO ALERT CARD at check-in to the Emergency Department and triage nurse.   

## 2014-07-22 ENCOUNTER — Ambulatory Visit: Payer: Self-pay

## 2014-07-22 NOTE — Progress Notes (Signed)
Patient states she does not think she will need to come in for IV fluids tomorrow - Saturday 07/22/14.   Told her we would leave her appt. In for Saturday and that if she decided that she still did not need to come in for IVF that it would be fine.  Patient is eating and drinking without problems today.  She is a very difficult IV access and required 5 attempts for access today.

## 2014-07-25 ENCOUNTER — Telehealth: Payer: Self-pay

## 2014-07-25 ENCOUNTER — Ambulatory Visit (HOSPITAL_COMMUNITY)
Admission: RE | Admit: 2014-07-25 | Discharge: 2014-07-25 | Disposition: A | Discharge status: Home / Self Care | Payer: 59 | Source: Ambulatory Visit | Attending: Gynecologic Oncology | Admitting: Gynecologic Oncology

## 2014-07-25 ENCOUNTER — Other Ambulatory Visit: Payer: Self-pay | Admitting: Gynecologic Oncology

## 2014-07-25 DIAGNOSIS — C562 Malignant neoplasm of left ovary: Principal | ICD-10-CM

## 2014-07-25 DIAGNOSIS — C563 Malignant neoplasm of bilateral ovaries: Secondary | ICD-10-CM

## 2014-07-25 DIAGNOSIS — C561 Malignant neoplasm of right ovary: Secondary | ICD-10-CM

## 2014-07-25 DIAGNOSIS — N63 Unspecified lump in breast: Secondary | ICD-10-CM | POA: Insufficient documentation

## 2014-07-25 DIAGNOSIS — N6310 Unspecified lump in the right breast, unspecified quadrant: Secondary | ICD-10-CM

## 2014-07-25 MED ORDER — LIDOCAINE-EPINEPHRINE (PF) 1 %-1:200000 IJ SOLN
INTRAMUSCULAR | Status: AC
  Filled 2014-07-25: qty 10

## 2014-07-25 NOTE — Discharge Instructions (Signed)
Breast Biopsy °Care After °These instructions give you information on caring for yourself after your procedure. Your doctor may also give you more specific instructions. Call your doctor if you have any problems or questions after your procedure. °HOME CARE °· Only take medicine as told by your doctor. °· Do not take aspirin. °· Keep your sutures (stitches) dry when bathing. °· Protect the biopsy area. Do not let the area get bumped. °· Avoid activities that could pull the biopsy site open until your doctor approves. This includes: °· Stretching. °· Reaching. °· Exercise. °· Sports. °· Lifting more than 3lb. °· Continue your normal diet. °· Wear a good support bra for as long as told by your doctor. °· Change any bandages (dressings) as told by your doctor. °· Do not drink alcohol while taking pain medicine. °· Keep all doctor visits as told. Ask when your test results will be ready. Make sure you get your test results. °GET HELP RIGHT AWAY IF:  °· You have a fever. °· You have more bleeding (more than a small spot) from the biopsy site. °· You have trouble breathing. °· You have yellowish-white fluid (pus) coming from the biopsy site. °· You have redness, puffiness (swelling), or more pain in the biopsy site. °· You have a bad smell coming from the biopsy site. °· Your biopsy site opens after sutures, staples, or sticky strips have been removed. °· You have a rash. °· You need stronger medicine. °MAKE SURE YOU: °· Understand these instructions. °· Will watch your condition. °· Will get help right away if you are not doing well or get worse. °Document Released: 11/09/2008 Document Revised: 04/07/2011 Document Reviewed: 02/23/2011 °ExitCare® Patient Information ©2015 [employer]. This information is not intended to replace advice given to you by your health care provider. Make sure you discuss any questions you have with your health care provider. ° °Breast Biopsy °A breast biopsy is a test during which a sample of  tissue is taken from your breast. The breast tissue is looked at under a microscope for cancer cells.  °BEFORE THE PROCEDURE °· Make plans to have someone drive you home after the test. °· Do not smoke for 2 weeks before the test. Stop smoking, if you smoke. °· Do not drink alcohol for 24 hours before the test. °· Wear a good support bra to the test. °PROCEDURE  °You may be given one of the following: °· A medicine to numb the breast area (local anesthetic). °· A medicine to make you fall asleep (general anesthetic). °There are different types of breast biopsies. They include: °· Fine-needle aspiration. °¨ A needle is put into the breast lump. °¨ The needle takes out fluid and cells from the lump. °¨ Ultrasound imaging may be used to help find the lump and to put the needle in the right spot. °· Core-needle biopsy. °¨ A needle is put into the breast lump. °¨ The needle is put in your breast 3-6 times. °¨ The needle removes breast tissue. °¨ An ultrasound image or X-ray is often used to find the right spot to put in the needle. °· Stereotactic biopsy. °¨ X-rays and a computer are used to study X-ray pictures of the breast lump. °¨ The computer finds where the needle needs to be put into the breast. °¨ Tissue samples are taken out. °· Vacuum-assisted biopsy. °¨ A small cut (incision) is made in your breast. °¨ A biopsy device is put through the cut and into the breast   tissue. °¨ The biopsy device draws abnormal breast tissue into the biopsy device. °¨ A large tissue sample is often removed. °¨ No stitches are needed. °· Ultrasound-guided core-needle biopsy. °¨ Ultrasound imaging helps guide the needle into the area of the breast that is not normal. °¨ A cut is made in the breast. The needle is put into the breast lump. °¨ Tissue samples are taken out. °· Open biopsy. °¨ A large cut is made in the breast. °¨ Your doctor will try to remove the whole breast lump or as much as possible. °All tissue, fluid, or cell samples  are looked at under a microscope.  °AFTER THE PROCEDURE °· You will be taken to an area to recover. You will be able to go home once you are doing well and are without problems. °· You may have bruising on your breast. This is normal. °· A pressure bandage (dressing) may be put on your breast for 24-48 hours. This type of bandage is wrapped tightly around your chest. It helps stop fluid from building up underneath tissues. °Document Released: 04/07/2011 Document Revised: 05/30/2013 Document Reviewed: 04/07/2011 °ExitCare® Patient Information ©2015 [employer]. This information is not intended to replace advice given to you by your health care provider. Make sure you discuss any questions you have with your health care provider. ° °

## 2014-07-25 NOTE — Telephone Encounter (Signed)
Told Ms. Audrey Porter that Dr. Marko Porter feels that she needs a central line for the remaining 2 treatments as there were 5  attempts  for IV access on 07-21-14. Discussed PICC line access rationale  and care maintenance involved after catheter is place. Ms. Audrey Porter is agreeable to have catheter placement prior to D1  Cycle 5  ~08-11-14.  Will try to have it placed 08-10-14 with [facility] as Dsg needs to be changed the day after insertion and Ms. Audrey Porter would be at the [facility] for treatment. Order placed for PICC Line insertion.

## 2014-08-01 ENCOUNTER — Other Ambulatory Visit: Payer: Self-pay | Admitting: Oncology

## 2014-08-01 ENCOUNTER — Telehealth: Payer: Self-pay | Admitting: Oncology

## 2014-08-01 DIAGNOSIS — C563 Malignant neoplasm of bilateral ovaries: Secondary | ICD-10-CM

## 2014-08-01 DIAGNOSIS — C562 Malignant neoplasm of left ovary: Principal | ICD-10-CM

## 2014-08-01 DIAGNOSIS — C561 Malignant neoplasm of right ovary: Secondary | ICD-10-CM

## 2014-08-01 NOTE — Telephone Encounter (Signed)
Per pof appointments made and patient will get a new schedule at 7/7 per pof

## 2014-08-03 ENCOUNTER — Other Ambulatory Visit (HOSPITAL_BASED_OUTPATIENT_CLINIC_OR_DEPARTMENT_OTHER): Payer: Managed Care, Other (non HMO)

## 2014-08-03 ENCOUNTER — Encounter: Payer: Self-pay | Admitting: Oncology

## 2014-08-03 ENCOUNTER — Telehealth: Payer: Self-pay | Admitting: Oncology

## 2014-08-03 ENCOUNTER — Ambulatory Visit: Payer: BLUE CROSS/BLUE SHIELD

## 2014-08-03 ENCOUNTER — Ambulatory Visit (HOSPITAL_BASED_OUTPATIENT_CLINIC_OR_DEPARTMENT_OTHER): Payer: Managed Care, Other (non HMO) | Admitting: Oncology

## 2014-08-03 ENCOUNTER — Ambulatory Visit (HOSPITAL_COMMUNITY)
Admission: RE | Admit: 2014-08-03 | Discharge: 2014-08-03 | Disposition: A | Discharge status: Home / Self Care | Payer: Managed Care, Other (non HMO) | Source: Ambulatory Visit | Attending: Oncology | Admitting: Oncology

## 2014-08-03 VITALS — BP 126/76 | HR 81 | Temp 98.3°F | Resp 18 | Ht 64.0 in | Wt 197.4 lb

## 2014-08-03 DIAGNOSIS — T451X5A Adverse effect of antineoplastic and immunosuppressive drugs, initial encounter: Secondary | ICD-10-CM

## 2014-08-03 DIAGNOSIS — C561 Malignant neoplasm of right ovary: Secondary | ICD-10-CM

## 2014-08-03 DIAGNOSIS — D701 Agranulocytosis secondary to cancer chemotherapy: Secondary | ICD-10-CM | POA: Diagnosis not present

## 2014-08-03 DIAGNOSIS — C563 Malignant neoplasm of bilateral ovaries: Secondary | ICD-10-CM

## 2014-08-03 DIAGNOSIS — I878 Other specified disorders of veins: Secondary | ICD-10-CM | POA: Insufficient documentation

## 2014-08-03 DIAGNOSIS — D6481 Anemia due to antineoplastic chemotherapy: Secondary | ICD-10-CM

## 2014-08-03 DIAGNOSIS — G622 Polyneuropathy due to other toxic agents: Secondary | ICD-10-CM

## 2014-08-03 DIAGNOSIS — G620 Drug-induced polyneuropathy: Secondary | ICD-10-CM

## 2014-08-03 DIAGNOSIS — N6019 Diffuse cystic mastopathy of unspecified breast: Secondary | ICD-10-CM

## 2014-08-03 DIAGNOSIS — D6959 Other secondary thrombocytopenia: Secondary | ICD-10-CM

## 2014-08-03 DIAGNOSIS — C562 Malignant neoplasm of left ovary: Secondary | ICD-10-CM | POA: Diagnosis not present

## 2014-08-03 LAB — CBC WITH DIFFERENTIAL/PLATELET
BASO%: 0 % (ref 0.0–2.0)
Basophils Absolute: 0 10*3/uL (ref 0.0–0.1)
EOS%: 1 % (ref 0.0–7.0)
Eosinophils Absolute: 0 10*3/uL (ref 0.0–0.5)
HEMATOCRIT: 21 % — AB (ref 34.8–46.6)
HEMOGLOBIN: 7.2 g/dL — AB (ref 11.6–15.9)
LYMPH#: 2 10*3/uL (ref 0.9–3.3)
LYMPH%: 49.6 % (ref 14.0–49.7)
MCH: 31.3 pg (ref 25.1–34.0)
MCHC: 34.3 g/dL (ref 31.5–36.0)
MCV: 91.3 fL (ref 79.5–101.0)
MONO#: 0.3 10*3/uL (ref 0.1–0.9)
MONO%: 7.1 % (ref 0.0–14.0)
NEUT#: 1.7 10*3/uL (ref 1.5–6.5)
NEUT%: 42.3 % (ref 38.4–76.8)
Platelets: 58 10*3/uL — ABNORMAL LOW (ref 145–400)
RBC: 2.3 10*6/uL — ABNORMAL LOW (ref 3.70–5.45)
RDW: 20.5 % — ABNORMAL HIGH (ref 11.2–14.5)
WBC: 3.9 10*3/uL (ref 3.9–10.3)

## 2014-08-03 LAB — COMPREHENSIVE METABOLIC PANEL (CC13)
ALBUMIN: 3.9 g/dL (ref 3.5–5.0)
ALT: 15 U/L (ref 0–55)
ANION GAP: 9 meq/L (ref 3–11)
AST: 13 U/L (ref 5–34)
Alkaline Phosphatase: 87 U/L (ref 40–150)
BUN: 14.1 mg/dL (ref 7.0–26.0)
CALCIUM: 9.5 mg/dL (ref 8.4–10.4)
CHLORIDE: 110 meq/L — AB (ref 98–109)
CO2: 22 meq/L (ref 22–29)
Creatinine: 1.1 mg/dL (ref 0.6–1.1)
EGFR: 66 mL/min/{1.73_m2} — ABNORMAL LOW (ref >90)
Glucose: 91 mg/dl (ref 70–140)
POTASSIUM: 4 meq/L (ref 3.5–5.1)
Sodium: 141 mEq/L (ref 136–145)
Total Bilirubin: 0.46 mg/dL (ref 0.20–1.20)
Total Protein: 6.6 g/dL (ref 6.4–8.3)

## 2014-08-03 LAB — HOLD TUBE, BLOOD BANK

## 2014-08-03 LAB — PREPARE RBC (CROSSMATCH)

## 2014-08-03 LAB — ABO/RH: ABO/RH(D): B POS

## 2014-08-03 NOTE — Progress Notes (Signed)
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 She had cycle 4 carboplatin taxol on 07-21-14 with On Pro neulasta 07-22-14. She did not want additional IVF on 6-25. Hemoglobin is down to 7.2 today. She agrees to PRBCs, which can be done [facility] on 08-04-14. Platelets down to 58k today, no bleeding. Patient required 5 attempts to establish IV access for cycle 4 treatment, including by J.Myers. She has slightly tender area at attempted access site lert forearm. She agrees to PICC to complete planned chemo, originally set up for 08-10-14 but has been moved to 08-04-14 in order to use for PRBCs.   She had [place] right breast biopsy on 07-27-14, fortunately only fibrocystic disease without malignancy ([id number]).  Patient had little nausea and no vomiting. She was very constipated after chemo despite miralax and stool softener daily, understands that she should increase these for next treatments. She had more tingling in both feet after last treatment, improved now tho still more noticeable on left.   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  Daughter is visiting, about to start third year med school.   ONCOLOGIC HISTORY Patient had been in usual good health, up to date on all medical care, when she developed bladder symptoms in early 2016. Urine culture showed no infection, then CT AP at [facility] 03-16-14 found perihepatic ascites and pelvic mass 9.9 x 20.1 x 17.8 cm which was cystic and solid, kidneys and ureters unremarkable, no pelvic fluid, rectosigmoid partially compressed, other organs not remarkable, no mention of adenopathy (report  scanned into this EMR). Past gyn care has been by Dr Alden Hipp, however she was seen promptly by Dr Evie Lacks on referral from Dr Quillian Quince after CT resulted; CT findings confirmed by transvaginal US. CA 125 was 56 pre op. She saw Dr Alycia Rossetti in consultation on 03-23-14, then had surgery at [facility] on 03-31-14 which was TAH/BSO, omentectomy, pelvic and paraaortic node evaluation. Surgical findings were of bilateral complex ovarian masses and dense adhesions, with intra-operative spill of right ovarian mass as that was densely adherent to sigmoid serosa; posterior uterine segment also adherent to rectosigmoid, and retroperitoneal fibrosis bilaterally. Pathology [facility] [phone]) found grade 3 clear cell carcinoma involving both ovaries, largest 13 x 9.5 x 6.2 cm, + LVSI and one right pelvic node with microscopic involvement approximately 1 mm, ER PR negative, pT3a1 pN1 pMx. Cytology from ascites [facility] [phone]) was positive for adenocarcinoma of gyn primary. Multidisciplinary conference at [facility] 04-19-14 recommended carboplatin and taxane chemotherapy. Cycle 1 carbo taxol was given on 05-12-14, with neutropenia by day 11 (ANC 0.8) despite having started granix on day 8.She was hospitalized at [facility] after cycle 3 with syncope related to dehydration.     Review of systems as above, also: No fever or symptoms of infection. Not SOB at rest, tho she has been fatigued with exertion. No chest pain. No noted bleeding. No abdominal or pelvic pain now.  Remainder of 10 point Review of Systems negative.  Objective:  Vital signs in last 24 hours:  BP 126/76 mmHg  Pulse 81  Temp(Src) 98.3 F (36.8 C) (Oral)  Resp 18  Ht '5\' 4"'  (1.626 m)  Wt 197 lb 6.4  oz (89.54 kg)  BMI 33.87 kg/m2  SpO2 100% Weight up 1.5 lbs Alert, oriented and appropriate. Ambulatory without difficulty.  Alopecia  HEENT:PERRL, sclerae not icteric. Oral mucosa moist without lesions, posterior pharynx clear. Mucous membranes pale Neck supple. No JVD.   Lymphatics:no cervical,supraclavicular adenopathy Resp: clear to auscultation bilaterally and normal percussion bilaterally Cardio: regular rate and rhythm. No gallop. GI: soft, nontender, not distended, no mass or organomegaly. A few bowel sounds. Surgical incision not remarkable. Musculoskeletal/ Extremities: without pitting edema, cords, tenderness. Slight swelling one toe from trauma with suitcase. Neuro: no significant peripheral neuropathy. Otherwise nonfocal. PSYCH appropriate mood and affect Skin  Slight erythema and minimal puffiness without clear cord, slightly tender, 1 x 3 cm lateral left forearm, otherwise without rash, ecchymosis, petechiae   Lab Results:  Results for orders placed or performed in visit on 08/03/14  CBC with Differential  Result Value Ref Range   WBC 3.9 3.9 - 10.3 10e3/uL   NEUT# 1.7 1.5 - 6.5 10e3/uL   HGB 7.2 (L) 11.6 - 15.9 g/dL   HCT 21.0 (L) 34.8 - 46.6 %   Platelets 58 (L) 145 - 400 10e3/uL   MCV 91.3 79.5 - 101.0 fL   MCH 31.3 25.1 - 34.0 pg   MCHC 34.3 31.5 - 36.0 g/dL   RBC 2.30 (L) 3.70 - 5.45 10e6/uL   RDW 20.5 (H) 11.2 - 14.5 %   lymph# 2.0 0.9 - 3.3 10e3/uL   MONO# 0.3 0.1 - 0.9 10e3/uL   Eosinophils Absolute 0.0 0.0 - 0.5 10e3/uL   Basophils Absolute 0.0 0.0 - 0.1 10e3/uL   NEUT% 42.3 38.4 - 76.8 %   LYMPH% 49.6 14.0 - 49.7 %   MONO% 7.1 0.0 - 14.0 %   EOS% 1.0 0.0 - 7.0 %   BASO% 0.0 0.0 - 2.0 %  Comprehensive metabolic panel (Cmet) - [facility]  Result Value Ref Range   Sodium 141 136 - 145 mEq/L   Potassium 4.0 3.5 - 5.1 mEq/L   Chloride 110 (H) 98 - 109 mEq/L   CO2 22 22 - 29 mEq/L   Glucose 91 70 - 140 mg/dl   BUN 14.1 7.0 - 26.0 mg/dL   Creatinine 1.1 0.6 - 1.1 mg/dL   Total Bilirubin 0.46 0.20 - 1.20 mg/dL   Alkaline Phosphatase 87 40 - 150 U/L   AST 13 5 - 34 U/L   ALT 15 0 - 55 U/L   Total Protein 6.6 6.4 - 8.3 g/dL   Albumin 3.9 3.5 - 5.0 g/dL   Calcium 9.5 8.4 - 10.4 mg/dL   Anion Gap 9 3 - 11 mEq/L   EGFR 66 (L)  >90 ml/min/1.73 m2    Note iron studies not low 07-20-14. Type and cross drawn  Studies/Results:  No results found.  Medications: I have reviewed the patient's current medications. Should increase miralax and stool softener around chemo treatments.  DISCUSSION: progressive anemia discussed and she is in agreement with transfusion 2 units PRBCs tomorrow.  She understands plan for PICC, which will need dressing change on 7-9 after placement, then 2x weekly flush. Warm soaks to left forearm every 30 min rest of today (no NSAID with platelets low) and call if worse or does not quickly resolve.   Assessment/Plan: 1. High grade clear cell carcinoma of bilateral ovaries with microscopic involvement of one right pelvic node at complete debulking surgery by Dr Alycia Rossetti at [facility] 03-31-2014, FIGO IIIA1 (i). Every 3 week carboplatin and taxol x 6 cycles  begun 05-12-14. Neulasta by On Pro injector helpful. Cycle 5 on July 15 as long as ANC >=1.5 and plt >=100k by labs 7-14. I will see her next on 08-22-14. 2,peripheral IV access not adequate, for PICC on 08-04-14.  3.Slight phlebitis at site of attempted IV left forearm: warm soaks every 30 min rest of today and call if worse or not improving. 4.benign right breast biopsy at [facility] on 07-25-14 5.previous fulguration of endometriosis, post C section 6.GERD controlled with prilosec 7.rash to sulfa 8.environmental allergies, previously used zyrtec and flonase, now using claritin due to taxol/gCSF aches. 9.Advance Directive information given 10.Creatinine more elevated with hyzaar and with dehydration previously. 11. Chemo anemia, iron not low. PRBCs on 7-8  All questions answered. PRBC orders placed. Chemo orders and neulasta on pro orders in EMR. Time spent 30 min including >50% counseling and coordination of care   Ryne Mctigue P, MD   08/03/2014, 4:23 PM

## 2014-08-03 NOTE — Telephone Encounter (Signed)
Pt confirmed labs/ov per 07/07 POF, gave pt AVS and Calendar.... KJ °

## 2014-08-03 NOTE — Patient Instructions (Signed)
Call if any bleeding.  Warm (hot) soaks to left forearm every 20-30 min rest of this evening. Let Dr Mariana Kaufman office know if this is any more red, tender or swollen

## 2014-08-04 ENCOUNTER — Telehealth (HOSPITAL_COMMUNITY): Payer: Self-pay

## 2014-08-04 ENCOUNTER — Ambulatory Visit: Payer: Self-pay

## 2014-08-04 ENCOUNTER — Other Ambulatory Visit: Payer: Self-pay | Admitting: Oncology

## 2014-08-04 ENCOUNTER — Ambulatory Visit (HOSPITAL_COMMUNITY)
Admission: RE | Admit: 2014-08-04 | Discharge: 2014-08-04 | Disposition: A | Discharge status: Home / Self Care | Payer: Managed Care, Other (non HMO) | Source: Ambulatory Visit | Attending: Oncology | Admitting: Oncology

## 2014-08-04 VITALS — BP 138/68 | HR 84 | Temp 98.1°F | Resp 18

## 2014-08-04 DIAGNOSIS — D6481 Anemia due to antineoplastic chemotherapy: Secondary | ICD-10-CM

## 2014-08-04 DIAGNOSIS — C569 Malignant neoplasm of unspecified ovary: Secondary | ICD-10-CM | POA: Insufficient documentation

## 2014-08-04 DIAGNOSIS — C562 Malignant neoplasm of left ovary: Secondary | ICD-10-CM

## 2014-08-04 DIAGNOSIS — T451X5A Adverse effect of antineoplastic and immunosuppressive drugs, initial encounter: Principal | ICD-10-CM

## 2014-08-04 DIAGNOSIS — C561 Malignant neoplasm of right ovary: Secondary | ICD-10-CM

## 2014-08-04 DIAGNOSIS — C563 Malignant neoplasm of bilateral ovaries: Secondary | ICD-10-CM

## 2014-08-04 MED ORDER — ACETAMINOPHEN 325 MG PO TABS
325.0000 mg | ORAL_TABLET | Freq: Once | ORAL | Status: AC
  Administered 2014-08-04: 325 mg via ORAL
  Filled 2014-08-04: qty 1

## 2014-08-04 MED ORDER — SODIUM CHLORIDE 0.9 % IV SOLN
250.0000 mL | Freq: Once | INTRAVENOUS | Status: AC
  Administered 2014-08-04: 250 mL via INTRAVENOUS

## 2014-08-04 MED ORDER — SODIUM CHLORIDE 0.9 % IJ SOLN: 3.0000 mL | INTRAMUSCULAR | Status: DC | PRN

## 2014-08-04 MED ORDER — HEPARIN SOD (PORK) LOCK FLUSH 100 UNIT/ML IV SOLN: 500.0000 [IU] | Freq: Once | INTRAVENOUS | Status: DC

## 2014-08-04 MED ORDER — LIDOCAINE HCL 1 % IJ SOLN
INTRAMUSCULAR | Status: AC
  Filled 2014-08-04: qty 20

## 2014-08-04 MED ORDER — HEPARIN SOD (PORK) LOCK FLUSH 100 UNIT/ML IV SOLN: 500.0000 [IU] | Freq: Every day | INTRAVENOUS | Status: DC | PRN

## 2014-08-04 MED ORDER — SODIUM CHLORIDE 0.9 % IJ SOLN
10.0000 mL | INTRAMUSCULAR | Status: AC | PRN
  Administered 2014-08-04: 10 mL

## 2014-08-04 MED ORDER — HEPARIN SOD (PORK) LOCK FLUSH 100 UNIT/ML IV SOLN
250.0000 [IU] | INTRAVENOUS | Status: AC | PRN
  Administered 2014-08-04: 250 [IU]
  Filled 2014-08-04: qty 5

## 2014-08-04 NOTE — Procedures (Signed)
Successful placement of dual lumen PICC line to right brachial vein. Length 38cm Tip at lower SVC/RA No complications Ready for use.  Tsosie Billing D PA-C 08/04/14

## 2014-08-04 NOTE — Procedures (Signed)
Associated DX: Antineoplastic chemotherapy induced Anemia MD: Marko Plume  Procedure Note: PICC flushed, blood return noted, 2 units PRB's infused, PICC flushed per protocol Condition during procedure: Tolerated well. Condition after procedure: Alert, oriented, and ambulatory

## 2014-08-05 ENCOUNTER — Other Ambulatory Visit: Payer: Self-pay | Admitting: Oncology

## 2014-08-05 ENCOUNTER — Ambulatory Visit (HOSPITAL_BASED_OUTPATIENT_CLINIC_OR_DEPARTMENT_OTHER): Payer: Managed Care, Other (non HMO)

## 2014-08-05 VITALS — BP 131/76 | HR 65 | Temp 98.6°F | Resp 16

## 2014-08-05 DIAGNOSIS — N6019 Diffuse cystic mastopathy of unspecified breast: Secondary | ICD-10-CM | POA: Insufficient documentation

## 2014-08-05 DIAGNOSIS — T451X5A Adverse effect of antineoplastic and immunosuppressive drugs, initial encounter: Secondary | ICD-10-CM

## 2014-08-05 DIAGNOSIS — C562 Malignant neoplasm of left ovary: Secondary | ICD-10-CM

## 2014-08-05 DIAGNOSIS — Z452 Encounter for adjustment and management of vascular access device: Secondary | ICD-10-CM | POA: Diagnosis not present

## 2014-08-05 DIAGNOSIS — C561 Malignant neoplasm of right ovary: Secondary | ICD-10-CM

## 2014-08-05 DIAGNOSIS — D6481 Anemia due to antineoplastic chemotherapy: Secondary | ICD-10-CM

## 2014-08-05 DIAGNOSIS — D6959 Other secondary thrombocytopenia: Secondary | ICD-10-CM | POA: Insufficient documentation

## 2014-08-05 MED ORDER — HEPARIN SOD (PORK) LOCK FLUSH 100 UNIT/ML IV SOLN
250.0000 [IU] | INTRAVENOUS | Status: AC | PRN
  Administered 2014-08-05: 250 [IU]
  Filled 2014-08-05: qty 5

## 2014-08-05 MED ORDER — SODIUM CHLORIDE 0.9 % IJ SOLN
10.0000 mL | INTRAMUSCULAR | Status: AC | PRN
  Administered 2014-08-05: 10 mL
  Filled 2014-08-05: qty 10

## 2014-08-06 LAB — TYPE AND SCREEN
ABO/RH(D): B POS
Antibody Screen: NEGATIVE
UNIT DIVISION: 0
Unit division: 0

## 2014-08-07 ENCOUNTER — Ambulatory Visit (HOSPITAL_BASED_OUTPATIENT_CLINIC_OR_DEPARTMENT_OTHER): Payer: Managed Care, Other (non HMO)

## 2014-08-07 VITALS — BP 130/68 | HR 75 | Temp 98.8°F | Resp 16

## 2014-08-07 DIAGNOSIS — C562 Malignant neoplasm of left ovary: Secondary | ICD-10-CM | POA: Diagnosis not present

## 2014-08-07 DIAGNOSIS — C561 Malignant neoplasm of right ovary: Secondary | ICD-10-CM | POA: Diagnosis not present

## 2014-08-07 DIAGNOSIS — Z452 Encounter for adjustment and management of vascular access device: Secondary | ICD-10-CM

## 2014-08-07 MED ORDER — SODIUM CHLORIDE 0.9 % IJ SOLN
10.0000 mL | INTRAMUSCULAR | Status: DC | PRN
  Administered 2014-08-07: 20 mL via INTRAVENOUS
  Filled 2014-08-07: qty 10

## 2014-08-07 MED ORDER — HEPARIN SOD (PORK) LOCK FLUSH 100 UNIT/ML IV SOLN
500.0000 [IU] | Freq: Once | INTRAVENOUS | Status: AC
  Administered 2014-08-07: 500 [IU] via INTRAVENOUS
  Filled 2014-08-07: qty 5

## 2014-08-07 NOTE — Progress Notes (Signed)
Pt came in for a dressing change today. Pt declines dressing change today, she would like to be on a Thursday dressing change schedule when she comes in for labs. Current dressing applied on 7/9. Clean, Dry and intact. Area of insertion is clear. Pt reports no pain, bleeding, no erythema or swelling. Pt denies any questions or concerns. PICC line flushed, blood return noted from both lumen. Caps changed.

## 2014-08-07 NOTE — Patient Instructions (Signed)
PICC Home Guide A peripherally inserted central catheter (PICC) is a long, thin, flexible tube that is inserted into a vein in the upper arm. It is a form of intravenous (IV) access. It is considered to be a "central" line because the tip of the PICC ends in a large vein in your chest. This large vein is called the superior vena cava (SVC). The PICC tip ends in the SVC because there is a lot of blood flow in the SVC. This allows medicines and IV fluids to be quickly distributed throughout the body. The PICC is inserted using a sterile technique by a specially trained nurse or physician. After the PICC is inserted, a chest X-ray exam is done to be sure it is in the correct place.  A PICC may be placed for different reasons, such as:  To give medicines and liquid nutrition that can only be given through a central line. Examples are:  Certain antibiotic treatments.  Chemotherapy.  Total parenteral nutrition (TPN).  To take frequent blood samples.  To give IV fluids and blood products.  If there is difficulty placing a peripheral intravenous (PIV) catheter. If taken care of properly, a PICC can remain in place for several months. A PICC can also allow a person to go home from the hospital early. Medicine and PICC care can be managed at home by a family member or home health care team. WHAT PROBLEMS CAN HAPPEN WHEN I HAVE A PICC? Problems with a PICC can occasionally occur. These may include the following:  A blood clot (thrombus) forming in or at the tip of the PICC. This can cause the PICC to become clogged. A clot-dissolving medicine called tissue plasminogen activator (tPA) can be given through the PICC to help break up the clot.  Inflammation of the vein (phlebitis) in which the PICC is placed. Signs of inflammation may include redness, pain at the insertion site, red streaks, or being able to feel a "cord" in the vein where the PICC is located.  Infection in the PICC or at the insertion  site. Signs of infection may include fever, chills, redness, swelling, or pus drainage from the PICC insertion site.  PICC movement (malposition). The PICC tip may move from its original position due to excessive physical activity, forceful coughing, sneezing, or vomiting.  A break or cut in the PICC. It is important to not use scissors near the PICC.  Nerve or tendon irritation or injury during PICC insertion. WHAT SHOULD I KEEP IN MIND ABOUT ACTIVITIES WHEN I HAVE A PICC?  You may bend your arm and move it freely. If your PICC is near or at the bend of your elbow, avoid activity with repeated motion at the elbow.  Rest at home for the remainder of the day following PICC line insertion.  Avoid lifting heavy objects as instructed by your health care provider.  Avoid using a crutch with the arm on the same side as your PICC. You may need to use a walker. WHAT SHOULD I KNOW ABOUT MY PICC DRESSING?  Keep your PICC bandage (dressing) clean and dry to prevent infection.  Ask your health care provider when you may shower. Ask your health care provider to teach you how to wrap the PICC when you do take a shower.  Change the PICC dressing as instructed by your health care provider.  Change your PICC dressing if it becomes loose or wet. WHAT SHOULD I KNOW ABOUT PICC CARE?  Check the PICC insertion site   daily for leakage, redness, swelling, or pain.  Do not take a bath, swim, or use hot tubs when you have a PICC. Cover PICC line with clear plastic wrap and tape to keep it dry while showering.  Flush the PICC as directed by your health care provider. Let your health care provider know right away if the PICC is difficult to flush or does not flush. Do not use force to flush the PICC.  Do not use a syringe that is less than 10 mL to flush the PICC.  Never pull or tug on the PICC.  Avoid blood pressure checks on the arm with the PICC.  Keep your PICC identification card with you at all  times.  Do not take the PICC out yourself. Only a trained clinical professional should remove the PICC. SEEK IMMEDIATE MEDICAL CARE IF:  Your PICC is accidentally pulled all the way out. If this happens, cover the insertion site with a bandage or gauze dressing. Do not throw the PICC away. Your health care provider will need to inspect it.  Your PICC was tugged or pulled and has partially come out. Do not  push the PICC back in.  There is any type of drainage, redness, or swelling where the PICC enters the skin.  You cannot flush the PICC, it is difficult to flush, or the PICC leaks around the insertion site when it is flushed.  You hear a "flushing" sound when the PICC is flushed.  You have pain, discomfort, or numbness in your arm, shoulder, or jaw on the same side as the PICC.  You feel your heart "racing" or skipping beats.  You notice a hole or tear in the PICC.  You develop chills or a fever. MAKE SURE YOU:   Understand these instructions.  Will watch your condition.  Will get help right away if you are not doing well or get worse. Document Released: 07/20/2002 Document Revised: 05/30/2013 Document Reviewed: 09/20/2012 ExitCare Patient Information 2015 [employer]. This information is not intended to replace advice given to you by your health care provider. Make sure you discuss any questions you have with your health care provider.  

## 2014-08-10 ENCOUNTER — Other Ambulatory Visit (HOSPITAL_BASED_OUTPATIENT_CLINIC_OR_DEPARTMENT_OTHER): Payer: Managed Care, Other (non HMO)

## 2014-08-10 ENCOUNTER — Other Ambulatory Visit: Payer: Self-pay | Admitting: Oncology

## 2014-08-10 ENCOUNTER — Ambulatory Visit (HOSPITAL_COMMUNITY): Admission: RE | Admit: 2014-08-10 | Payer: 59 | Source: Ambulatory Visit

## 2014-08-10 ENCOUNTER — Telehealth: Payer: Self-pay | Admitting: Nurse Practitioner

## 2014-08-10 ENCOUNTER — Ambulatory Visit (HOSPITAL_BASED_OUTPATIENT_CLINIC_OR_DEPARTMENT_OTHER): Payer: Managed Care, Other (non HMO)

## 2014-08-10 VITALS — BP 125/63 | HR 72 | Temp 97.8°F | Resp 16

## 2014-08-10 DIAGNOSIS — C562 Malignant neoplasm of left ovary: Secondary | ICD-10-CM | POA: Diagnosis not present

## 2014-08-10 DIAGNOSIS — C561 Malignant neoplasm of right ovary: Secondary | ICD-10-CM

## 2014-08-10 DIAGNOSIS — C563 Malignant neoplasm of bilateral ovaries: Secondary | ICD-10-CM

## 2014-08-10 DIAGNOSIS — Z452 Encounter for adjustment and management of vascular access device: Secondary | ICD-10-CM

## 2014-08-10 LAB — COMPREHENSIVE METABOLIC PANEL (CC13)
ALBUMIN: 3.7 g/dL (ref 3.5–5.0)
ALK PHOS: 64 U/L (ref 40–150)
ALT: 13 U/L (ref 0–55)
ANION GAP: 6 meq/L (ref 3–11)
AST: 15 U/L (ref 5–34)
BUN: 17.3 mg/dL (ref 7.0–26.0)
CHLORIDE: 108 meq/L (ref 98–109)
CO2: 26 meq/L (ref 22–29)
CREATININE: 1 mg/dL (ref 0.6–1.1)
Calcium: 9.4 mg/dL (ref 8.4–10.4)
EGFR: 72 mL/min/{1.73_m2} — AB (ref >90)
GLUCOSE: 86 mg/dL (ref 70–140)
Potassium: 4.3 mEq/L (ref 3.5–5.1)
Sodium: 140 mEq/L (ref 136–145)
TOTAL PROTEIN: 6.5 g/dL (ref 6.4–8.3)
Total Bilirubin: 0.51 mg/dL (ref 0.20–1.20)

## 2014-08-10 LAB — CBC WITH DIFFERENTIAL/PLATELET
BASO%: 0.7 % (ref 0.0–2.0)
Basophils Absolute: 0 10*3/uL (ref 0.0–0.1)
EOS ABS: 0.1 10*3/uL (ref 0.0–0.5)
EOS%: 2.1 % (ref 0.0–7.0)
HCT: 30.3 % — ABNORMAL LOW (ref 34.8–46.6)
HEMOGLOBIN: 10.4 g/dL — AB (ref 11.6–15.9)
LYMPH%: 41.8 % (ref 14.0–49.7)
MCH: 32.3 pg (ref 25.1–34.0)
MCHC: 34.4 g/dL (ref 31.5–36.0)
MCV: 93.9 fL (ref 79.5–101.0)
MONO#: 0.5 10*3/uL (ref 0.1–0.9)
MONO%: 12.8 % (ref 0.0–14.0)
NEUT%: 42.6 % (ref 38.4–76.8)
NEUTROS ABS: 1.6 10*3/uL (ref 1.5–6.5)
PLATELETS: 130 10*3/uL — AB (ref 145–400)
RBC: 3.23 10*6/uL — AB (ref 3.70–5.45)
RDW: 20.1 % — ABNORMAL HIGH (ref 11.2–14.5)
WBC: 3.8 10*3/uL — AB (ref 3.9–10.3)
lymph#: 1.6 10*3/uL (ref 0.9–3.3)

## 2014-08-10 LAB — CA 125: CA 125: 6 U/mL (ref <35)

## 2014-08-10 MED ORDER — SODIUM CHLORIDE 0.9 % IJ SOLN
10.0000 mL | INTRAMUSCULAR | Status: DC | PRN
  Administered 2014-08-10: 10 mL via INTRAVENOUS
  Filled 2014-08-10: qty 10

## 2014-08-10 MED ORDER — HEPARIN SOD (PORK) LOCK FLUSH 100 UNIT/ML IV SOLN
500.0000 [IU] | Freq: Once | INTRAVENOUS | Status: AC
  Administered 2014-08-10: 250 [IU] via INTRAVENOUS
  Filled 2014-08-10: qty 5

## 2014-08-10 NOTE — Telephone Encounter (Signed)
Patient notified OK to proceed with treatment per MD parameters below.  On 08/10/14 ANC 1.6 and plt 130. She denies bleeding. Patient verbalizes understanding and is aware of her 8:45 am infusion apt on 08/11/14. She knows to call clinic in the meantime with any questions or needs.

## 2014-08-10 NOTE — Telephone Encounter (Signed)
-----   Message from Gordy Levan, MD sent at 08/05/2014  8:02 PM EDT ----- For PICC flush 7-11 @ 3:00 (PICC just placed 7-8) She had slight phlebitis at IV site left forearm when I saw her on 7-7. Please desk RN or flush RN check that site, let me know if concerns.  Ask if any bleeding, as plt 58k last week. She was transfused PRBCs on 7-8   For CBC on 7-14:   need to let her know if ok for chemo on 7-15: treat if ANC >=1.5 and plt >=100k, otherwise delay a week.  I don't think we have all PICC flushes on schedule yet.  Thank you

## 2014-08-10 NOTE — Patient Instructions (Signed)
PICC Home Guide A peripherally inserted central catheter (PICC) is a long, thin, flexible tube that is inserted into a vein in the upper arm. It is a form of intravenous (IV) access. It is considered to be a "central" line because the tip of the PICC ends in a large vein in your chest. This large vein is called the superior vena cava (SVC). The PICC tip ends in the SVC because there is a lot of blood flow in the SVC. This allows medicines and IV fluids to be quickly distributed throughout the body. The PICC is inserted using a sterile technique by a specially trained nurse or physician. After the PICC is inserted, a chest X-ray exam is done to be sure it is in the correct place.  A PICC may be placed for different reasons, such as:  To give medicines and liquid nutrition that can only be given through a central line. Examples are:  Certain antibiotic treatments.  Chemotherapy.  Total parenteral nutrition (TPN).  To take frequent blood samples.  To give IV fluids and blood products.  If there is difficulty placing a peripheral intravenous (PIV) catheter. If taken care of properly, a PICC can remain in place for several months. A PICC can also allow a person to go home from the hospital early. Medicine and PICC care can be managed at home by a family member or home health care team. WHAT PROBLEMS CAN HAPPEN WHEN I HAVE A PICC? Problems with a PICC can occasionally occur. These may include the following:  A blood clot (thrombus) forming in or at the tip of the PICC. This can cause the PICC to become clogged. A clot-dissolving medicine called tissue plasminogen activator (tPA) can be given through the PICC to help break up the clot.  Inflammation of the vein (phlebitis) in which the PICC is placed. Signs of inflammation may include redness, pain at the insertion site, red streaks, or being able to feel a "cord" in the vein where the PICC is located.  Infection in the PICC or at the insertion  site. Signs of infection may include fever, chills, redness, swelling, or pus drainage from the PICC insertion site.  PICC movement (malposition). The PICC tip may move from its original position due to excessive physical activity, forceful coughing, sneezing, or vomiting.  A break or cut in the PICC. It is important to not use scissors near the PICC.  Nerve or tendon irritation or injury during PICC insertion. WHAT SHOULD I KEEP IN MIND ABOUT ACTIVITIES WHEN I HAVE A PICC?  You may bend your arm and move it freely. If your PICC is near or at the bend of your elbow, avoid activity with repeated motion at the elbow.  Rest at home for the remainder of the day following PICC line insertion.  Avoid lifting heavy objects as instructed by your health care provider.  Avoid using a crutch with the arm on the same side as your PICC. You may need to use a walker. WHAT SHOULD I KNOW ABOUT MY PICC DRESSING?  Keep your PICC bandage (dressing) clean and dry to prevent infection.  Ask your health care provider when you may shower. Ask your health care provider to teach you how to wrap the PICC when you do take a shower.  Change the PICC dressing as instructed by your health care provider.  Change your PICC dressing if it becomes loose or wet. WHAT SHOULD I KNOW ABOUT PICC CARE?  Check the PICC insertion site   daily for leakage, redness, swelling, or pain.  Do not take a bath, swim, or use hot tubs when you have a PICC. Cover PICC line with clear plastic wrap and tape to keep it dry while showering.  Flush the PICC as directed by your health care provider. Let your health care provider know right away if the PICC is difficult to flush or does not flush. Do not use force to flush the PICC.  Do not use a syringe that is less than 10 mL to flush the PICC.  Never pull or tug on the PICC.  Avoid blood pressure checks on the arm with the PICC.  Keep your PICC identification card with you at all  times.  Do not take the PICC out yourself. Only a trained clinical professional should remove the PICC. SEEK IMMEDIATE MEDICAL CARE IF:  Your PICC is accidentally pulled all the way out. If this happens, cover the insertion site with a bandage or gauze dressing. Do not throw the PICC away. Your health care provider will need to inspect it.  Your PICC was tugged or pulled and has partially come out. Do not  push the PICC back in.  There is any type of drainage, redness, or swelling where the PICC enters the skin.  You cannot flush the PICC, it is difficult to flush, or the PICC leaks around the insertion site when it is flushed.  You hear a "flushing" sound when the PICC is flushed.  You have pain, discomfort, or numbness in your arm, shoulder, or jaw on the same side as the PICC.  You feel your heart "racing" or skipping beats.  You notice a hole or tear in the PICC.  You develop chills or a fever. MAKE SURE YOU:   Understand these instructions.  Will watch your condition.  Will get help right away if you are not doing well or get worse. Document Released: 07/20/2002 Document Revised: 05/30/2013 Document Reviewed: 09/20/2012 ExitCare Patient Information 2015 [employer]. This information is not intended to replace advice given to you by your health care provider. Make sure you discuss any questions you have with your health care provider.  

## 2014-08-11 ENCOUNTER — Ambulatory Visit (HOSPITAL_BASED_OUTPATIENT_CLINIC_OR_DEPARTMENT_OTHER): Payer: Managed Care, Other (non HMO)

## 2014-08-11 VITALS — BP 130/67 | HR 79 | Temp 98.0°F | Resp 18

## 2014-08-11 DIAGNOSIS — C562 Malignant neoplasm of left ovary: Secondary | ICD-10-CM

## 2014-08-11 DIAGNOSIS — C563 Malignant neoplasm of bilateral ovaries: Secondary | ICD-10-CM

## 2014-08-11 DIAGNOSIS — Z5189 Encounter for other specified aftercare: Secondary | ICD-10-CM | POA: Diagnosis not present

## 2014-08-11 DIAGNOSIS — Z5111 Encounter for antineoplastic chemotherapy: Secondary | ICD-10-CM | POA: Diagnosis not present

## 2014-08-11 DIAGNOSIS — C561 Malignant neoplasm of right ovary: Secondary | ICD-10-CM

## 2014-08-11 MED ORDER — PACLITAXEL CHEMO INJECTION 300 MG/50ML
135.0000 mg/m2 | Freq: Once | INTRAVENOUS | Status: AC
  Administered 2014-08-11: 270 mg via INTRAVENOUS
  Filled 2014-08-11: qty 45

## 2014-08-11 MED ORDER — FAMOTIDINE IN NACL 20-0.9 MG/50ML-% IV SOLN
INTRAVENOUS | Status: AC
  Filled 2014-08-11: qty 50

## 2014-08-11 MED ORDER — SODIUM CHLORIDE 0.9 % IV SOLN
470.0000 mg | Freq: Once | INTRAVENOUS | Status: AC
  Administered 2014-08-11: 470 mg via INTRAVENOUS
  Filled 2014-08-11: qty 47

## 2014-08-11 MED ORDER — SODIUM CHLORIDE 0.9 % IV SOLN
250.0000 mL | Freq: Once | INTRAVENOUS | Status: AC
  Administered 2014-08-11: 250 mL via INTRAVENOUS

## 2014-08-11 MED ORDER — HEPARIN SOD (PORK) LOCK FLUSH 100 UNIT/ML IV SOLN
250.0000 [IU] | Freq: Once | INTRAVENOUS | Status: AC | PRN
  Administered 2014-08-11: 250 [IU]
  Filled 2014-08-11: qty 5

## 2014-08-11 MED ORDER — DIPHENHYDRAMINE HCL 50 MG/ML IJ SOLN
50.0000 mg | Freq: Once | INTRAMUSCULAR | Status: AC
  Administered 2014-08-11: 50 mg via INTRAVENOUS

## 2014-08-11 MED ORDER — DIPHENHYDRAMINE HCL 50 MG/ML IJ SOLN
INTRAMUSCULAR | Status: AC
  Filled 2014-08-11: qty 1

## 2014-08-11 MED ORDER — SODIUM CHLORIDE 0.9 % IJ SOLN
10.0000 mL | INTRAMUSCULAR | Status: DC | PRN
  Filled 2014-08-11: qty 10

## 2014-08-11 MED ORDER — HEPARIN SOD (PORK) LOCK FLUSH 100 UNIT/ML IV SOLN
500.0000 [IU] | Freq: Once | INTRAVENOUS | Status: AC
  Administered 2014-08-11: 250 [IU] via INTRAVENOUS
  Filled 2014-08-11: qty 5

## 2014-08-11 MED ORDER — PEGFILGRASTIM 6 MG/0.6ML ~~LOC~~ PSKT
6.0000 mg | PREFILLED_SYRINGE | Freq: Once | SUBCUTANEOUS | Status: AC
  Administered 2014-08-11: 6 mg via SUBCUTANEOUS
  Filled 2014-08-11: qty 0.6

## 2014-08-11 MED ORDER — FAMOTIDINE IN NACL 20-0.9 MG/50ML-% IV SOLN
20.0000 mg | Freq: Once | INTRAVENOUS | Status: AC
  Administered 2014-08-11: 20 mg via INTRAVENOUS

## 2014-08-11 MED ORDER — SODIUM CHLORIDE 0.9 % IJ SOLN
10.0000 mL | INTRAMUSCULAR | Status: DC | PRN
  Administered 2014-08-11: 10 mL via INTRAVENOUS
  Filled 2014-08-11: qty 10

## 2014-08-11 MED ORDER — SODIUM CHLORIDE 0.9 % IV SOLN
Freq: Once | INTRAVENOUS | Status: AC
  Administered 2014-08-11: 10:00:00 via INTRAVENOUS
  Filled 2014-08-11: qty 8

## 2014-08-11 NOTE — Patient Instructions (Signed)
[facility] Discharge Instructions for Patients Receiving Chemotherapy  Today you received the following chemotherapy agents Taxol and Carboplatin.  To help prevent nausea and vomiting after your treatment, we encourage you to take your nausea medication Zofran 8mg  every 8 hours as needed.  If you develop nausea and vomiting that is not controlled by your nausea medication, call the clinic.   BELOW ARE SYMPTOMS THAT SHOULD BE REPORTED IMMEDIATELY:  *FEVER GREATER THAN 100.5 F  *CHILLS WITH OR WITHOUT FEVER  NAUSEA AND VOMITING THAT IS NOT CONTROLLED WITH YOUR NAUSEA MEDICATION  *UNUSUAL SHORTNESS OF BREATH  *UNUSUAL BRUISING OR BLEEDING  TENDERNESS IN MOUTH AND THROAT WITH OR WITHOUT PRESENCE OF ULCERS  *URINARY PROBLEMS  *BOWEL PROBLEMS  UNUSUAL RASH Items with * indicate a potential emergency and should be followed up as soon as possible.  Feel free to call the clinic you have any questions or concerns. The clinic phone number is [phone].  Please show the [place] at check-in to the Emergency Department and triage nurse.

## 2014-08-12 ENCOUNTER — Other Ambulatory Visit: Payer: Self-pay | Admitting: Oncology

## 2014-08-12 DIAGNOSIS — C563 Malignant neoplasm of bilateral ovaries: Secondary | ICD-10-CM

## 2014-08-12 DIAGNOSIS — C562 Malignant neoplasm of left ovary: Principal | ICD-10-CM

## 2014-08-12 DIAGNOSIS — C561 Malignant neoplasm of right ovary: Secondary | ICD-10-CM

## 2014-08-14 ENCOUNTER — Ambulatory Visit (HOSPITAL_BASED_OUTPATIENT_CLINIC_OR_DEPARTMENT_OTHER): Payer: Managed Care, Other (non HMO)

## 2014-08-14 DIAGNOSIS — Z452 Encounter for adjustment and management of vascular access device: Secondary | ICD-10-CM

## 2014-08-14 DIAGNOSIS — C561 Malignant neoplasm of right ovary: Secondary | ICD-10-CM

## 2014-08-14 MED ORDER — SODIUM CHLORIDE 0.9 % IJ SOLN
10.0000 mL | INTRAMUSCULAR | Status: DC | PRN
  Administered 2014-08-14: 20 mL via INTRAVENOUS
  Filled 2014-08-14: qty 10

## 2014-08-14 MED ORDER — HEPARIN SOD (PORK) LOCK FLUSH 100 UNIT/ML IV SOLN
500.0000 [IU] | Freq: Once | INTRAVENOUS | Status: AC
Start: 2014-08-14 — End: 2014-08-14
  Administered 2014-08-14: 500 [IU] via INTRAVENOUS
  Filled 2014-08-14: qty 5

## 2014-08-14 NOTE — Patient Instructions (Signed)
PICC Home Guide A peripherally inserted central catheter (PICC) is a long, thin, flexible tube that is inserted into a vein in the upper arm. It is a form of intravenous (IV) access. It is considered to be a "central" line because the tip of the PICC ends in a large vein in your chest. This large vein is called the superior vena cava (SVC). The PICC tip ends in the SVC because there is a lot of blood flow in the SVC. This allows medicines and IV fluids to be quickly distributed throughout the body. The PICC is inserted using a sterile technique by a specially trained nurse or physician. After the PICC is inserted, a chest X-ray exam is done to be sure it is in the correct place.  A PICC may be placed for different reasons, such as:  To give medicines and liquid nutrition that can only be given through a central line. Examples are:  Certain antibiotic treatments.  Chemotherapy.  Total parenteral nutrition (TPN).  To take frequent blood samples.  To give IV fluids and blood products.  If there is difficulty placing a peripheral intravenous (PIV) catheter. If taken care of properly, a PICC can remain in place for several months. A PICC can also allow a person to go home from the hospital early. Medicine and PICC care can be managed at home by a family member or home health care team. WHAT PROBLEMS CAN HAPPEN WHEN I HAVE A PICC? Problems with a PICC can occasionally occur. These may include the following:  A blood clot (thrombus) forming in or at the tip of the PICC. This can cause the PICC to become clogged. A clot-dissolving medicine called tissue plasminogen activator (tPA) can be given through the PICC to help break up the clot.  Inflammation of the vein (phlebitis) in which the PICC is placed. Signs of inflammation may include redness, pain at the insertion site, red streaks, or being able to feel a "cord" in the vein where the PICC is located.  Infection in the PICC or at the insertion  site. Signs of infection may include fever, chills, redness, swelling, or pus drainage from the PICC insertion site.  PICC movement (malposition). The PICC tip may move from its original position due to excessive physical activity, forceful coughing, sneezing, or vomiting.  A break or cut in the PICC. It is important to not use scissors near the PICC.  Nerve or tendon irritation or injury during PICC insertion. WHAT SHOULD I KEEP IN MIND ABOUT ACTIVITIES WHEN I HAVE A PICC?  You may bend your arm and move it freely. If your PICC is near or at the bend of your elbow, avoid activity with repeated motion at the elbow.  Rest at home for the remainder of the day following PICC line insertion.  Avoid lifting heavy objects as instructed by your health care provider.  Avoid using a crutch with the arm on the same side as your PICC. You may need to use a walker. WHAT SHOULD I KNOW ABOUT MY PICC DRESSING?  Keep your PICC bandage (dressing) clean and dry to prevent infection.  Ask your health care provider when you may shower. Ask your health care provider to teach you how to wrap the PICC when you do take a shower.  Change the PICC dressing as instructed by your health care provider.  Change your PICC dressing if it becomes loose or wet. WHAT SHOULD I KNOW ABOUT PICC CARE?  Check the PICC insertion site   daily for leakage, redness, swelling, or pain.  Do not take a bath, swim, or use hot tubs when you have a PICC. Cover PICC line with clear plastic wrap and tape to keep it dry while showering.  Flush the PICC as directed by your health care provider. Let your health care provider know right away if the PICC is difficult to flush or does not flush. Do not use force to flush the PICC.  Do not use a syringe that is less than 10 mL to flush the PICC.  Never pull or tug on the PICC.  Avoid blood pressure checks on the arm with the PICC.  Keep your PICC identification card with you at all  times.  Do not take the PICC out yourself. Only a trained clinical professional should remove the PICC. SEEK IMMEDIATE MEDICAL CARE IF:  Your PICC is accidentally pulled all the way out. If this happens, cover the insertion site with a bandage or gauze dressing. Do not throw the PICC away. Your health care provider will need to inspect it.  Your PICC was tugged or pulled and has partially come out. Do not  push the PICC back in.  There is any type of drainage, redness, or swelling where the PICC enters the skin.  You cannot flush the PICC, it is difficult to flush, or the PICC leaks around the insertion site when it is flushed.  You hear a "flushing" sound when the PICC is flushed.  You have pain, discomfort, or numbness in your arm, shoulder, or jaw on the same side as the PICC.  You feel your heart "racing" or skipping beats.  You notice a hole or tear in the PICC.  You develop chills or a fever. MAKE SURE YOU:   Understand these instructions.  Will watch your condition.  Will get help right away if you are not doing well or get worse. Document Released: 07/20/2002 Document Revised: 05/30/2013 Document Reviewed: 09/20/2012 ExitCare Patient Information 2015 [employer]. This information is not intended to replace advice given to you by your health care provider. Make sure you discuss any questions you have with your health care provider.  

## 2014-08-17 ENCOUNTER — Ambulatory Visit (HOSPITAL_BASED_OUTPATIENT_CLINIC_OR_DEPARTMENT_OTHER): Payer: Managed Care, Other (non HMO)

## 2014-08-17 ENCOUNTER — Other Ambulatory Visit (HOSPITAL_BASED_OUTPATIENT_CLINIC_OR_DEPARTMENT_OTHER): Payer: Managed Care, Other (non HMO)

## 2014-08-17 VITALS — BP 156/76 | HR 86 | Temp 98.7°F | Resp 16

## 2014-08-17 DIAGNOSIS — C563 Malignant neoplasm of bilateral ovaries: Secondary | ICD-10-CM

## 2014-08-17 DIAGNOSIS — C562 Malignant neoplasm of left ovary: Principal | ICD-10-CM

## 2014-08-17 DIAGNOSIS — C561 Malignant neoplasm of right ovary: Secondary | ICD-10-CM | POA: Diagnosis not present

## 2014-08-17 DIAGNOSIS — Z452 Encounter for adjustment and management of vascular access device: Secondary | ICD-10-CM

## 2014-08-17 LAB — COMPREHENSIVE METABOLIC PANEL (CC13)
ALBUMIN: 3.7 g/dL (ref 3.5–5.0)
ALT: 17 U/L (ref 0–55)
AST: 17 U/L (ref 5–34)
Alkaline Phosphatase: 89 U/L (ref 40–150)
Anion Gap: 7 mEq/L (ref 3–11)
BUN: 14.2 mg/dL (ref 7.0–26.0)
CALCIUM: 9.2 mg/dL (ref 8.4–10.4)
CO2: 25 mEq/L (ref 22–29)
CREATININE: 0.9 mg/dL (ref 0.6–1.1)
Chloride: 109 mEq/L (ref 98–109)
EGFR: 79 mL/min/{1.73_m2} — ABNORMAL LOW (ref >90)
Glucose: 111 mg/dl (ref 70–140)
POTASSIUM: 4.5 meq/L (ref 3.5–5.1)
Sodium: 141 mEq/L (ref 136–145)
TOTAL PROTEIN: 6.5 g/dL (ref 6.4–8.3)
Total Bilirubin: 0.33 mg/dL (ref 0.20–1.20)

## 2014-08-17 LAB — CBC WITH DIFFERENTIAL/PLATELET
BASO%: 0.5 % (ref 0.0–2.0)
Basophils Absolute: 0.1 10*3/uL (ref 0.0–0.1)
EOS%: 0.6 % (ref 0.0–7.0)
Eosinophils Absolute: 0.1 10*3/uL (ref 0.0–0.5)
HEMATOCRIT: 29.3 % — AB (ref 34.8–46.6)
HGB: 10.2 g/dL — ABNORMAL LOW (ref 11.6–15.9)
LYMPH%: 24.4 % (ref 14.0–49.7)
MCH: 32.9 pg (ref 25.1–34.0)
MCHC: 34.8 g/dL (ref 31.5–36.0)
MCV: 94.6 fL (ref 79.5–101.0)
MONO#: 1.7 10*3/uL — AB (ref 0.1–0.9)
MONO%: 15.8 % — AB (ref 0.0–14.0)
NEUT#: 6.4 10*3/uL (ref 1.5–6.5)
NEUT%: 58.7 % (ref 38.4–76.8)
Platelets: 264 10*3/uL (ref 145–400)
RBC: 3.09 10*6/uL — ABNORMAL LOW (ref 3.70–5.45)
RDW: 19.8 % — AB (ref 11.2–14.5)
WBC: 10.9 10*3/uL — ABNORMAL HIGH (ref 3.9–10.3)
lymph#: 2.7 10*3/uL (ref 0.9–3.3)

## 2014-08-17 MED ORDER — HEPARIN SOD (PORK) LOCK FLUSH 100 UNIT/ML IV SOLN
500.0000 [IU] | Freq: Once | INTRAVENOUS | Status: AC
Start: 2014-08-17 — End: 2014-08-17
  Administered 2014-08-17: 250 [IU] via INTRAVENOUS
  Filled 2014-08-17: qty 5

## 2014-08-17 MED ORDER — SODIUM CHLORIDE 0.9 % IJ SOLN
10.0000 mL | INTRAMUSCULAR | Status: DC | PRN
  Administered 2014-08-17: 10 mL via INTRAVENOUS
  Filled 2014-08-17: qty 10

## 2014-08-17 NOTE — Patient Instructions (Signed)
PICC Home Guide A peripherally inserted central catheter (PICC) is a long, thin, flexible tube that is inserted into a vein in the upper arm. It is a form of intravenous (IV) access. It is considered to be a "central" line because the tip of the PICC ends in a large vein in your chest. This large vein is called the superior vena cava (SVC). The PICC tip ends in the SVC because there is a lot of blood flow in the SVC. This allows medicines and IV fluids to be quickly distributed throughout the body. The PICC is inserted using a sterile technique by a specially trained nurse or physician. After the PICC is inserted, a chest X-ray exam is done to be sure it is in the correct place.  A PICC may be placed for different reasons, such as:  To give medicines and liquid nutrition that can only be given through a central line. Examples are:  Certain antibiotic treatments.  Chemotherapy.  Total parenteral nutrition (TPN).  To take frequent blood samples.  To give IV fluids and blood products.  If there is difficulty placing a peripheral intravenous (PIV) catheter. If taken care of properly, a PICC can remain in place for several months. A PICC can also allow a person to go home from the hospital early. Medicine and PICC care can be managed at home by a family member or home health care team. WHAT PROBLEMS CAN HAPPEN WHEN I HAVE A PICC? Problems with a PICC can occasionally occur. These may include the following:  A blood clot (thrombus) forming in or at the tip of the PICC. This can cause the PICC to become clogged. A clot-dissolving medicine called tissue plasminogen activator (tPA) can be given through the PICC to help break up the clot.  Inflammation of the vein (phlebitis) in which the PICC is placed. Signs of inflammation may include redness, pain at the insertion site, red streaks, or being able to feel a "cord" in the vein where the PICC is located.  Infection in the PICC or at the insertion  site. Signs of infection may include fever, chills, redness, swelling, or pus drainage from the PICC insertion site.  PICC movement (malposition). The PICC tip may move from its original position due to excessive physical activity, forceful coughing, sneezing, or vomiting.  A break or cut in the PICC. It is important to not use scissors near the PICC.  Nerve or tendon irritation or injury during PICC insertion. WHAT SHOULD I KEEP IN MIND ABOUT ACTIVITIES WHEN I HAVE A PICC?  You may bend your arm and move it freely. If your PICC is near or at the bend of your elbow, avoid activity with repeated motion at the elbow.  Rest at home for the remainder of the day following PICC line insertion.  Avoid lifting heavy objects as instructed by your health care provider.  Avoid using a crutch with the arm on the same side as your PICC. You may need to use a walker. WHAT SHOULD I KNOW ABOUT MY PICC DRESSING?  Keep your PICC bandage (dressing) clean and dry to prevent infection.  Ask your health care provider when you may shower. Ask your health care provider to teach you how to wrap the PICC when you do take a shower.  Change the PICC dressing as instructed by your health care provider.  Change your PICC dressing if it becomes loose or wet. WHAT SHOULD I KNOW ABOUT PICC CARE?  Check the PICC insertion site   daily for leakage, redness, swelling, or pain.  Do not take a bath, swim, or use hot tubs when you have a PICC. Cover PICC line with clear plastic wrap and tape to keep it dry while showering.  Flush the PICC as directed by your health care provider. Let your health care provider know right away if the PICC is difficult to flush or does not flush. Do not use force to flush the PICC.  Do not use a syringe that is less than 10 mL to flush the PICC.  Never pull or tug on the PICC.  Avoid blood pressure checks on the arm with the PICC.  Keep your PICC identification card with you at all  times.  Do not take the PICC out yourself. Only a trained clinical professional should remove the PICC. SEEK IMMEDIATE MEDICAL CARE IF:  Your PICC is accidentally pulled all the way out. If this happens, cover the insertion site with a bandage or gauze dressing. Do not throw the PICC away. Your health care provider will need to inspect it.  Your PICC was tugged or pulled and has partially come out. Do not  push the PICC back in.  There is any type of drainage, redness, or swelling where the PICC enters the skin.  You cannot flush the PICC, it is difficult to flush, or the PICC leaks around the insertion site when it is flushed.  You hear a "flushing" sound when the PICC is flushed.  You have pain, discomfort, or numbness in your arm, shoulder, or jaw on the same side as the PICC.  You feel your heart "racing" or skipping beats.  You notice a hole or tear in the PICC.  You develop chills or a fever. MAKE SURE YOU:   Understand these instructions.  Will watch your condition.  Will get help right away if you are not doing well or get worse. Document Released: 07/20/2002 Document Revised: 05/30/2013 Document Reviewed: 09/20/2012 ExitCare Patient Information 2015 [employer]. This information is not intended to replace advice given to you by your health care provider. Make sure you discuss any questions you have with your health care provider.  

## 2014-08-20 ENCOUNTER — Other Ambulatory Visit: Payer: Self-pay | Admitting: Oncology

## 2014-08-21 ENCOUNTER — Other Ambulatory Visit: Payer: Self-pay | Admitting: Oncology

## 2014-08-22 ENCOUNTER — Ambulatory Visit (HOSPITAL_BASED_OUTPATIENT_CLINIC_OR_DEPARTMENT_OTHER): Payer: Managed Care, Other (non HMO)

## 2014-08-22 ENCOUNTER — Encounter: Payer: Self-pay | Admitting: Oncology

## 2014-08-22 ENCOUNTER — Telehealth: Payer: Self-pay | Admitting: Oncology

## 2014-08-22 ENCOUNTER — Other Ambulatory Visit (HOSPITAL_BASED_OUTPATIENT_CLINIC_OR_DEPARTMENT_OTHER): Payer: Managed Care, Other (non HMO)

## 2014-08-22 ENCOUNTER — Ambulatory Visit (HOSPITAL_BASED_OUTPATIENT_CLINIC_OR_DEPARTMENT_OTHER): Payer: Managed Care, Other (non HMO) | Admitting: Oncology

## 2014-08-22 VITALS — BP 133/74 | HR 79 | Temp 98.0°F | Resp 18 | Ht 64.0 in | Wt 196.0 lb

## 2014-08-22 DIAGNOSIS — C561 Malignant neoplasm of right ovary: Secondary | ICD-10-CM

## 2014-08-22 DIAGNOSIS — Z452 Encounter for adjustment and management of vascular access device: Secondary | ICD-10-CM

## 2014-08-22 DIAGNOSIS — T451X5A Adverse effect of antineoplastic and immunosuppressive drugs, initial encounter: Secondary | ICD-10-CM

## 2014-08-22 DIAGNOSIS — C562 Malignant neoplasm of left ovary: Secondary | ICD-10-CM | POA: Diagnosis not present

## 2014-08-22 DIAGNOSIS — C563 Malignant neoplasm of bilateral ovaries: Secondary | ICD-10-CM

## 2014-08-22 DIAGNOSIS — D701 Agranulocytosis secondary to cancer chemotherapy: Secondary | ICD-10-CM

## 2014-08-22 DIAGNOSIS — D6481 Anemia due to antineoplastic chemotherapy: Secondary | ICD-10-CM

## 2014-08-22 DIAGNOSIS — G620 Drug-induced polyneuropathy: Secondary | ICD-10-CM

## 2014-08-22 LAB — CBC WITH DIFFERENTIAL/PLATELET
BASO%: 0.2 % (ref 0.0–2.0)
Basophils Absolute: 0 10*3/uL (ref 0.0–0.1)
EOS ABS: 0 10*3/uL (ref 0.0–0.5)
EOS%: 0.2 % (ref 0.0–7.0)
HCT: 27.5 % — ABNORMAL LOW (ref 34.8–46.6)
HGB: 9.5 g/dL — ABNORMAL LOW (ref 11.6–15.9)
LYMPH#: 3 10*3/uL (ref 0.9–3.3)
LYMPH%: 24.8 % (ref 14.0–49.7)
MCH: 32 pg (ref 25.1–34.0)
MCHC: 34.5 g/dL (ref 31.5–36.0)
MCV: 92.6 fL (ref 79.5–101.0)
MONO#: 1.4 10*3/uL — AB (ref 0.1–0.9)
MONO%: 11.3 % (ref 0.0–14.0)
NEUT#: 7.6 10*3/uL — ABNORMAL HIGH (ref 1.5–6.5)
NEUT%: 63.5 % (ref 38.4–76.8)
Platelets: 218 10*3/uL (ref 145–400)
RBC: 2.97 10*6/uL — ABNORMAL LOW (ref 3.70–5.45)
RDW: 17.6 % — AB (ref 11.2–14.5)
WBC: 12 10*3/uL — AB (ref 3.9–10.3)

## 2014-08-22 LAB — COMPREHENSIVE METABOLIC PANEL (CC13)
ALBUMIN: 3.7 g/dL (ref 3.5–5.0)
ALT: 19 U/L (ref 0–55)
AST: 18 U/L (ref 5–34)
Alkaline Phosphatase: 93 U/L (ref 40–150)
Anion Gap: 9 mEq/L (ref 3–11)
BUN: 16.1 mg/dL (ref 7.0–26.0)
CALCIUM: 9.1 mg/dL (ref 8.4–10.4)
CHLORIDE: 110 meq/L — AB (ref 98–109)
CO2: 24 meq/L (ref 22–29)
Creatinine: 1.1 mg/dL (ref 0.6–1.1)
EGFR: 64 mL/min/{1.73_m2} — ABNORMAL LOW (ref >90)
Glucose: 98 mg/dl (ref 70–140)
POTASSIUM: 3.8 meq/L (ref 3.5–5.1)
SODIUM: 143 meq/L (ref 136–145)
TOTAL PROTEIN: 6.4 g/dL (ref 6.4–8.3)
Total Bilirubin: 0.33 mg/dL (ref 0.20–1.20)

## 2014-08-22 MED ORDER — SODIUM CHLORIDE 0.9 % IJ SOLN
10.0000 mL | INTRAMUSCULAR | Status: DC | PRN
  Administered 2014-08-22: 10 mL via INTRAVENOUS
  Filled 2014-08-22: qty 10

## 2014-08-22 MED ORDER — HEPARIN SOD (PORK) LOCK FLUSH 100 UNIT/ML IV SOLN
500.0000 [IU] | Freq: Once | INTRAVENOUS | Status: AC
  Administered 2014-08-22: 500 [IU] via INTRAVENOUS
  Filled 2014-08-22: qty 5

## 2014-08-22 NOTE — Telephone Encounter (Signed)
Appointments made and avs printed for patient °

## 2014-08-22 NOTE — Patient Instructions (Signed)
PICC Home Guide A peripherally inserted central catheter (PICC) is a long, thin, flexible tube that is inserted into a vein in the upper arm. It is a form of intravenous (IV) access. It is considered to be a "central" line because the tip of the PICC ends in a large vein in your chest. This large vein is called the superior vena cava (SVC). The PICC tip ends in the SVC because there is a lot of blood flow in the SVC. This allows medicines and IV fluids to be quickly distributed throughout the body. The PICC is inserted using a sterile technique by a specially trained nurse or physician. After the PICC is inserted, a chest X-ray exam is done to be sure it is in the correct place.  A PICC may be placed for different reasons, such as:  To give medicines and liquid nutrition that can only be given through a central line. Examples are:  Certain antibiotic treatments.  Chemotherapy.  Total parenteral nutrition (TPN).  To take frequent blood samples.  To give IV fluids and blood products.  If there is difficulty placing a peripheral intravenous (PIV) catheter. If taken care of properly, a PICC can remain in place for several months. A PICC can also allow a person to go home from the hospital early. Medicine and PICC care can be managed at home by a family member or home health care team. WHAT PROBLEMS CAN HAPPEN WHEN I HAVE A PICC? Problems with a PICC can occasionally occur. These may include the following:  A blood clot (thrombus) forming in or at the tip of the PICC. This can cause the PICC to become clogged. A clot-dissolving medicine called tissue plasminogen activator (tPA) can be given through the PICC to help break up the clot.  Inflammation of the vein (phlebitis) in which the PICC is placed. Signs of inflammation may include redness, pain at the insertion site, red streaks, or being able to feel a "cord" in the vein where the PICC is located.  Infection in the PICC or at the insertion  site. Signs of infection may include fever, chills, redness, swelling, or pus drainage from the PICC insertion site.  PICC movement (malposition). The PICC tip may move from its original position due to excessive physical activity, forceful coughing, sneezing, or vomiting.  A break or cut in the PICC. It is important to not use scissors near the PICC.  Nerve or tendon irritation or injury during PICC insertion. WHAT SHOULD I KEEP IN MIND ABOUT ACTIVITIES WHEN I HAVE A PICC?  You may bend your arm and move it freely. If your PICC is near or at the bend of your elbow, avoid activity with repeated motion at the elbow.  Rest at home for the remainder of the day following PICC line insertion.  Avoid lifting heavy objects as instructed by your health care provider.  Avoid using a crutch with the arm on the same side as your PICC. You may need to use a walker. WHAT SHOULD I KNOW ABOUT MY PICC DRESSING?  Keep your PICC bandage (dressing) clean and dry to prevent infection.  Ask your health care provider when you may shower. Ask your health care provider to teach you how to wrap the PICC when you do take a shower.  Change the PICC dressing as instructed by your health care provider.  Change your PICC dressing if it becomes loose or wet. WHAT SHOULD I KNOW ABOUT PICC CARE?  Check the PICC insertion site   daily for leakage, redness, swelling, or pain.  Do not take a bath, swim, or use hot tubs when you have a PICC. Cover PICC line with clear plastic wrap and tape to keep it dry while showering.  Flush the PICC as directed by your health care provider. Let your health care provider know right away if the PICC is difficult to flush or does not flush. Do not use force to flush the PICC.  Do not use a syringe that is less than 10 mL to flush the PICC.  Never pull or tug on the PICC.  Avoid blood pressure checks on the arm with the PICC.  Keep your PICC identification card with you at all  times.  Do not take the PICC out yourself. Only a trained clinical professional should remove the PICC. SEEK IMMEDIATE MEDICAL CARE IF:  Your PICC is accidentally pulled all the way out. If this happens, cover the insertion site with a bandage or gauze dressing. Do not throw the PICC away. Your health care provider will need to inspect it.  Your PICC was tugged or pulled and has partially come out. Do not  push the PICC back in.  There is any type of drainage, redness, or swelling where the PICC enters the skin.  You cannot flush the PICC, it is difficult to flush, or the PICC leaks around the insertion site when it is flushed.  You hear a "flushing" sound when the PICC is flushed.  You have pain, discomfort, or numbness in your arm, shoulder, or jaw on the same side as the PICC.  You feel your heart "racing" or skipping beats.  You notice a hole or tear in the PICC.  You develop chills or a fever. MAKE SURE YOU:   Understand these instructions.  Will watch your condition.  Will get help right away if you are not doing well or get worse. Document Released: 07/20/2002 Document Revised: 05/30/2013 Document Reviewed: 09/20/2012 ExitCare Patient Information 2015 [employer]. This information is not intended to replace advice given to you by your health care provider. Make sure you discuss any questions you have with your health care provider.  

## 2014-08-22 NOTE — Progress Notes (Signed)
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 She had cycle 5 on 08-11-14, with On Pro neulasta.  She has had problems with cytopenias which have required some dose adjustment in chemo, with On Pro neulasta now supporting WBC, however she needed transfusion of 2 units PRBCs on 08-03-14 for hemoglobin down to 7.2. She felt much better after the PRBCs "cleaned the house". She had power PICC placed on 08-04-14, this flushed 2x weekly and dressing changes weekly at this office. She has had more aching in lower legs and feet as well as more numbness in feet since most recent treatment, tho this is not interfering with activity other than unable to tolerate high heels. She has minimal tingling in fingertips. Nausea has been controlled and bowels are moving regularly. She has had no bleeding.   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  Daughter is starting third year med school, first clinical rotation now in internal medicine.  ONCOLOGIC HISTORY Patient had been in usual good health, up to date on all medical care, when she developed bladder symptoms in early 2016. Urine culture showed no infection, then CT AP at [facility] 03-16-14 found perihepatic ascites and pelvic mass 9.9 x 20.1 x 17.8 cm which was cystic and solid, kidneys and ureters unremarkable, no pelvic fluid, rectosigmoid partially compressed, other organs not remarkable, no mention of adenopathy (report scanned into this EMR). Past gyn care has been by Dr Alden Hipp, however she was seen promptly by Dr Evie Lacks on referral from Dr Quillian Quince after CT resulted; CT findings  confirmed by transvaginal US. CA 125 was 56 pre op. She saw Dr Alycia Rossetti in consultation on 03-23-14, then had surgery at [facility] on 03-31-14 which was TAH/BSO, omentectomy, pelvic and paraaortic node evaluation. Surgical findings were of bilateral complex ovarian masses and dense adhesions, with intra-operative spill of right ovarian mass as that was densely adherent to sigmoid serosa; posterior uterine segment also adherent to rectosigmoid, and retroperitoneal fibrosis bilaterally. Pathology [facility] [phone]) found grade 3 clear cell carcinoma involving both ovaries, largest 13 x 9.5 x 6.2 cm, + LVSI and one right pelvic node with microscopic involvement approximately 1 mm, ER PR negative, pT3a1 pN1 pMx. Cytology from ascites [facility] [phone]) was positive for adenocarcinoma of gyn primary. Multidisciplinary conference at [facility] 04-19-14 recommended carboplatin and taxane chemotherapy. Cycle 1 carbo taxol was given on 05-12-14, with neutropenia by day 11 (ANC 0.8) despite having started granix on day 8.She was hospitalized at [facility] after cycle 3 with syncope related to dehydration.     Review of systems as above, also: No fever or symptoms of infection. No swelling LE. No swelling RUE side of PICC. Remainder of 10 point Review of Systems negative.  Objective:  Vital signs in last 24 hours:  BP 133/74 mmHg  Pulse 79  Temp(Src) 98 F (36.7 C) (Oral)  Resp 18  Ht 5' 4" (1.626 m)  Wt 196 lb (88.905 kg)  BMI 33.63 kg/m2 Weight down 1 lb Alert, oriented and appropriate. Ambulatory without difficulty. Looks comfortable, respirations not labored with activity in exam room.  Alopecia  HEENT:PERRL, sclerae not icteric. Oral mucosa moist without lesions, posterior pharynx clear.  Mucous membranes still somewhat pale. Neck supple. No JVD.  Lymphatics:no cervical,suraclavicular, axillary or inguinal adenopathy Resp: clear to auscultation bilaterally  Cardio: regular rate and rhythm. No gallop. GI: soft, nontender,  not distended, no mass or organomegaly. Normally active bowel sounds.  Musculoskeletal/ Extremities: without pitting edema, cords, tenderness Neuro: mild peripheral neuropathy as noted. Otherwise nonfocal. PSYCH appropriate mood and affect Skin without rash, ecchymosis, petechiae PICC dressed, site unremarkable, no swelling RUE.  Lab Results:  Results for orders placed or performed in visit on 08/22/14  CBC with Differential  Result Value Ref Range   WBC 12.0 (H) 3.9 - 10.3 10e3/uL   NEUT# 7.6 (H) 1.5 - 6.5 10e3/uL   HGB 9.5 (L) 11.6 - 15.9 g/dL   HCT 27.5 (L) 34.8 - 46.6 %   Platelets 218 145 - 400 10e3/uL   MCV 92.6 79.5 - 101.0 fL   MCH 32.0 25.1 - 34.0 pg   MCHC 34.5 31.5 - 36.0 g/dL   RBC 2.97 (L) 3.70 - 5.45 10e6/uL   RDW 17.6 (H) 11.2 - 14.5 %   lymph# 3.0 0.9 - 3.3 10e3/uL   MONO# 1.4 (H) 0.1 - 0.9 10e3/uL   Eosinophils Absolute 0.0 0.0 - 0.5 10e3/uL   Basophils Absolute 0.0 0.0 - 0.1 10e3/uL   NEUT% 63.5 38.4 - 76.8 %   LYMPH% 24.8 14.0 - 49.7 %   MONO% 11.3 0.0 - 14.0 %   EOS% 0.2 0.0 - 7.0 %   BASO% 0.2 0.0 - 2.0 %    CMET available after visit normal with exception of chloride 110 and total protein 6.2 CA 125 on 08-10-14 was 6  Studies/Results:  No results found.  Medications: I have reviewed the patient's current medications.  DISCUSSION: discussed taxol related peripheral neuropathy. She understands that this generally improves and frequently resolves entirely after chemo completes, tho it is usually cumulative and at times does not resolve. She is more concerned about doing everything possible for best effect of chemo against the ovarian cancer rather than holding taxol due to possible worsening of peripheral neuropathy. Hopefully the neuropathy will still improve prior to cycle 6 on 09-01-14 even so.  Discussed keeping PICC in place for restaging CT after chemo completed; confirmed with radiology that this is a power PICC, so can be used for the scans.    Assessment/Plan: 1. High grade clear cell carcinoma of bilateral ovaries with microscopic involvement of one right pelvic node at complete debulking surgery by Dr Alycia Rossetti at [facility] 03-31-2014, FIGO IIIA1 (i). Every 3 week carboplatin and taxol x 6 cycles begun 05-12-14. Neulasta by On Pro injector helpful. Cycle 6 on Aug 5 as long as ANC >=1.5 and plt >=100k. She will have CT AP prior to removing PICC after chemo completes, and I will see her with labs around that time. Dr Alycia Rossetti will be in [place] office in Sept, for follow up then.  2,PICC placed 08-04-14. 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 Chemo neutropenia continuing neulasta support. 4.benign right breast biopsy at [facility] on 07-25-14 5.previous fulguration of endometriosis, post C section 6.GERD controlled with prilosec 7.rash to sulfa 8.environmental allergies, previously used zyrtec and flonase, now using claritin due to taxol/gCSF aches. 9.Advance Directive information given 10.Creatinine more elevated with hyzaar and with dehydration previously. 11. Chemo anemia, iron not low, improved with PRBCs x 2 units 08-03-14.    All questions answered. Chemo and neulasta orders confirmed. Patient knows to call if concerns prior to next scheduled visits.  Time spent 25 min including >50% counseling and coordination of care. Cc Drs Quillian Quince and Cammie Mcgee, MD   08/22/2014, 10:54 AM

## 2014-08-25 ENCOUNTER — Other Ambulatory Visit: Payer: Self-pay | Admitting: Oncology

## 2014-08-25 ENCOUNTER — Ambulatory Visit (HOSPITAL_BASED_OUTPATIENT_CLINIC_OR_DEPARTMENT_OTHER): Payer: Managed Care, Other (non HMO)

## 2014-08-25 ENCOUNTER — Other Ambulatory Visit (HOSPITAL_BASED_OUTPATIENT_CLINIC_OR_DEPARTMENT_OTHER): Payer: Managed Care, Other (non HMO)

## 2014-08-25 VITALS — BP 130/78 | HR 82 | Temp 98.6°F

## 2014-08-25 DIAGNOSIS — Z452 Encounter for adjustment and management of vascular access device: Secondary | ICD-10-CM | POA: Diagnosis not present

## 2014-08-25 DIAGNOSIS — C562 Malignant neoplasm of left ovary: Secondary | ICD-10-CM

## 2014-08-25 DIAGNOSIS — C561 Malignant neoplasm of right ovary: Secondary | ICD-10-CM

## 2014-08-25 DIAGNOSIS — C563 Malignant neoplasm of bilateral ovaries: Secondary | ICD-10-CM

## 2014-08-25 LAB — CBC WITH DIFFERENTIAL/PLATELET
BASO%: 0.2 % (ref 0.0–2.0)
Basophils Absolute: 0 10*3/uL (ref 0.0–0.1)
EOS%: 0.4 % (ref 0.0–7.0)
Eosinophils Absolute: 0 10*3/uL (ref 0.0–0.5)
HEMATOCRIT: 26.3 % — AB (ref 34.8–46.6)
HEMOGLOBIN: 9.3 g/dL — AB (ref 11.6–15.9)
LYMPH#: 2.3 10*3/uL (ref 0.9–3.3)
LYMPH%: 23.8 % (ref 14.0–49.7)
MCH: 32.5 pg (ref 25.1–34.0)
MCHC: 35.4 g/dL (ref 31.5–36.0)
MCV: 92 fL (ref 79.5–101.0)
MONO#: 1 10*3/uL — ABNORMAL HIGH (ref 0.1–0.9)
MONO%: 10 % (ref 0.0–14.0)
NEUT%: 65.6 % (ref 38.4–76.8)
NEUTROS ABS: 6.3 10*3/uL (ref 1.5–6.5)
Platelets: 161 10*3/uL (ref 145–400)
RBC: 2.86 10*6/uL — ABNORMAL LOW (ref 3.70–5.45)
RDW: 17.4 % — AB (ref 11.2–14.5)
WBC: 9.6 10*3/uL (ref 3.9–10.3)

## 2014-08-25 LAB — COMPREHENSIVE METABOLIC PANEL (CC13)
ALK PHOS: 81 U/L (ref 40–150)
ALT: 16 U/L (ref 0–55)
AST: 16 U/L (ref 5–34)
Albumin: 3.5 g/dL (ref 3.5–5.0)
Anion Gap: 8 mEq/L (ref 3–11)
BUN: 17.2 mg/dL (ref 7.0–26.0)
CO2: 24 meq/L (ref 22–29)
Calcium: 8.8 mg/dL (ref 8.4–10.4)
Chloride: 110 mEq/L — ABNORMAL HIGH (ref 98–109)
Creatinine: 0.9 mg/dL (ref 0.6–1.1)
EGFR: 79 mL/min/{1.73_m2} — AB (ref >90)
Glucose: 112 mg/dl (ref 70–140)
POTASSIUM: 4 meq/L (ref 3.5–5.1)
SODIUM: 141 meq/L (ref 136–145)
Total Bilirubin: 0.3 mg/dL (ref 0.20–1.20)
Total Protein: 6.2 g/dL — ABNORMAL LOW (ref 6.4–8.3)

## 2014-08-25 MED ORDER — SODIUM CHLORIDE 0.9 % IJ SOLN
10.0000 mL | INTRAMUSCULAR | Status: DC | PRN
  Administered 2014-08-25: 10 mL via INTRAVENOUS
  Filled 2014-08-25: qty 10

## 2014-08-25 MED ORDER — HEPARIN SOD (PORK) LOCK FLUSH 100 UNIT/ML IV SOLN
500.0000 [IU] | Freq: Once | INTRAVENOUS | Status: AC
  Administered 2014-08-25: 250 [IU] via INTRAVENOUS
  Filled 2014-08-25: qty 5

## 2014-08-25 NOTE — Patient Instructions (Signed)
PICC Home Guide A peripherally inserted central catheter (PICC) is a long, thin, flexible tube that is inserted into a vein in the upper arm. It is a form of intravenous (IV) access. It is considered to be a "central" line because the tip of the PICC ends in a large vein in your chest. This large vein is called the superior vena cava (SVC). The PICC tip ends in the SVC because there is a lot of blood flow in the SVC. This allows medicines and IV fluids to be quickly distributed throughout the body. The PICC is inserted using a sterile technique by a specially trained nurse or physician. After the PICC is inserted, a chest X-ray exam is done to be sure it is in the correct place.  A PICC may be placed for different reasons, such as:  To give medicines and liquid nutrition that can only be given through a central line. Examples are:  Certain antibiotic treatments.  Chemotherapy.  Total parenteral nutrition (TPN).  To take frequent blood samples.  To give IV fluids and blood products.  If there is difficulty placing a peripheral intravenous (PIV) catheter. If taken care of properly, a PICC can remain in place for several months. A PICC can also allow a person to go home from the hospital early. Medicine and PICC care can be managed at home by a family member or home health care team. WHAT PROBLEMS CAN HAPPEN WHEN I HAVE A PICC? Problems with a PICC can occasionally occur. These may include the following:  A blood clot (thrombus) forming in or at the tip of the PICC. This can cause the PICC to become clogged. A clot-dissolving medicine called tissue plasminogen activator (tPA) can be given through the PICC to help break up the clot.  Inflammation of the vein (phlebitis) in which the PICC is placed. Signs of inflammation may include redness, pain at the insertion site, red streaks, or being able to feel a "cord" in the vein where the PICC is located.  Infection in the PICC or at the insertion  site. Signs of infection may include fever, chills, redness, swelling, or pus drainage from the PICC insertion site.  PICC movement (malposition). The PICC tip may move from its original position due to excessive physical activity, forceful coughing, sneezing, or vomiting.  A break or cut in the PICC. It is important to not use scissors near the PICC.  Nerve or tendon irritation or injury during PICC insertion. WHAT SHOULD I KEEP IN MIND ABOUT ACTIVITIES WHEN I HAVE A PICC?  You may bend your arm and move it freely. If your PICC is near or at the bend of your elbow, avoid activity with repeated motion at the elbow.  Rest at home for the remainder of the day following PICC line insertion.  Avoid lifting heavy objects as instructed by your health care provider.  Avoid using a crutch with the arm on the same side as your PICC. You may need to use a walker. WHAT SHOULD I KNOW ABOUT MY PICC DRESSING?  Keep your PICC bandage (dressing) clean and dry to prevent infection.  Ask your health care provider when you may shower. Ask your health care provider to teach you how to wrap the PICC when you do take a shower.  Change the PICC dressing as instructed by your health care provider.  Change your PICC dressing if it becomes loose or wet. WHAT SHOULD I KNOW ABOUT PICC CARE?  Check the PICC insertion site   daily for leakage, redness, swelling, or pain.  Do not take a bath, swim, or use hot tubs when you have a PICC. Cover PICC line with clear plastic wrap and tape to keep it dry while showering.  Flush the PICC as directed by your health care provider. Let your health care provider know right away if the PICC is difficult to flush or does not flush. Do not use force to flush the PICC.  Do not use a syringe that is less than 10 mL to flush the PICC.  Never pull or tug on the PICC.  Avoid blood pressure checks on the arm with the PICC.  Keep your PICC identification card with you at all  times.  Do not take the PICC out yourself. Only a trained clinical professional should remove the PICC. SEEK IMMEDIATE MEDICAL CARE IF:  Your PICC is accidentally pulled all the way out. If this happens, cover the insertion site with a bandage or gauze dressing. Do not throw the PICC away. Your health care provider will need to inspect it.  Your PICC was tugged or pulled and has partially come out. Do not  push the PICC back in.  There is any type of drainage, redness, or swelling where the PICC enters the skin.  You cannot flush the PICC, it is difficult to flush, or the PICC leaks around the insertion site when it is flushed.  You hear a "flushing" sound when the PICC is flushed.  You have pain, discomfort, or numbness in your arm, shoulder, or jaw on the same side as the PICC.  You feel your heart "racing" or skipping beats.  You notice a hole or tear in the PICC.  You develop chills or a fever. MAKE SURE YOU:   Understand these instructions.  Will watch your condition.  Will get help right away if you are not doing well or get worse. Document Released: 07/20/2002 Document Revised: 05/30/2013 Document Reviewed: 09/20/2012 ExitCare Patient Information 2015 [employer]. This information is not intended to replace advice given to you by your health care provider. Make sure you discuss any questions you have with your health care provider.  

## 2014-08-29 ENCOUNTER — Ambulatory Visit (HOSPITAL_BASED_OUTPATIENT_CLINIC_OR_DEPARTMENT_OTHER): Payer: Managed Care, Other (non HMO)

## 2014-08-29 ENCOUNTER — Other Ambulatory Visit (HOSPITAL_BASED_OUTPATIENT_CLINIC_OR_DEPARTMENT_OTHER): Payer: Managed Care, Other (non HMO)

## 2014-08-29 ENCOUNTER — Other Ambulatory Visit: Payer: Self-pay | Admitting: Oncology

## 2014-08-29 VITALS — BP 114/93 | HR 78 | Temp 98.5°F | Resp 16

## 2014-08-29 DIAGNOSIS — Z452 Encounter for adjustment and management of vascular access device: Secondary | ICD-10-CM

## 2014-08-29 DIAGNOSIS — C562 Malignant neoplasm of left ovary: Principal | ICD-10-CM

## 2014-08-29 DIAGNOSIS — C563 Malignant neoplasm of bilateral ovaries: Secondary | ICD-10-CM

## 2014-08-29 DIAGNOSIS — C561 Malignant neoplasm of right ovary: Secondary | ICD-10-CM

## 2014-08-29 LAB — COMPREHENSIVE METABOLIC PANEL (CC13)
ALBUMIN: 3.5 g/dL (ref 3.5–5.0)
ALT: 18 U/L (ref 0–55)
ANION GAP: 4 meq/L (ref 3–11)
AST: 16 U/L (ref 5–34)
Alkaline Phosphatase: 69 U/L (ref 40–150)
BILIRUBIN TOTAL: 0.34 mg/dL (ref 0.20–1.20)
BUN: 17.1 mg/dL (ref 7.0–26.0)
CALCIUM: 8.9 mg/dL (ref 8.4–10.4)
CHLORIDE: 111 meq/L — AB (ref 98–109)
CO2: 27 meq/L (ref 22–29)
Creatinine: 1 mg/dL (ref 0.6–1.1)
EGFR: 74 mL/min/{1.73_m2} — ABNORMAL LOW (ref >90)
GLUCOSE: 91 mg/dL (ref 70–140)
POTASSIUM: 4.1 meq/L (ref 3.5–5.1)
Sodium: 142 mEq/L (ref 136–145)
TOTAL PROTEIN: 6.1 g/dL — AB (ref 6.4–8.3)

## 2014-08-29 LAB — CBC WITH DIFFERENTIAL/PLATELET
BASO%: 0.5 % (ref 0.0–2.0)
Basophils Absolute: 0 10*3/uL (ref 0.0–0.1)
EOS ABS: 0 10*3/uL (ref 0.0–0.5)
EOS%: 0.6 % (ref 0.0–7.0)
HCT: 25.5 % — ABNORMAL LOW (ref 34.8–46.6)
HEMOGLOBIN: 8.8 g/dL — AB (ref 11.6–15.9)
LYMPH%: 33.4 % (ref 14.0–49.7)
MCH: 32.5 pg (ref 25.1–34.0)
MCHC: 34.6 g/dL (ref 31.5–36.0)
MCV: 93.9 fL (ref 79.5–101.0)
MONO#: 0.8 10*3/uL (ref 0.1–0.9)
MONO%: 15.8 % — AB (ref 0.0–14.0)
NEUT%: 49.7 % (ref 38.4–76.8)
NEUTROS ABS: 2.5 10*3/uL (ref 1.5–6.5)
PLATELETS: 150 10*3/uL (ref 145–400)
RBC: 2.71 10*6/uL — AB (ref 3.70–5.45)
RDW: 20.7 % — ABNORMAL HIGH (ref 11.2–14.5)
WBC: 5.1 10*3/uL (ref 3.9–10.3)
lymph#: 1.7 10*3/uL (ref 0.9–3.3)

## 2014-08-29 MED ORDER — HEPARIN SOD (PORK) LOCK FLUSH 100 UNIT/ML IV SOLN
500.0000 [IU] | Freq: Once | INTRAVENOUS | Status: AC
  Administered 2014-08-29: 500 [IU] via INTRAVENOUS
  Filled 2014-08-29: qty 5

## 2014-08-29 MED ORDER — SODIUM CHLORIDE 0.9 % IJ SOLN
10.0000 mL | INTRAMUSCULAR | Status: DC | PRN
  Administered 2014-08-29: 10 mL via INTRAVENOUS
  Filled 2014-08-29: qty 10

## 2014-08-29 NOTE — Patient Instructions (Signed)
PICC Home Guide A peripherally inserted central catheter (PICC) is a long, thin, flexible tube that is inserted into a vein in the upper arm. It is a form of intravenous (IV) access. It is considered to be a "central" line because the tip of the PICC ends in a large vein in your chest. This large vein is called the superior vena cava (SVC). The PICC tip ends in the SVC because there is a lot of blood flow in the SVC. This allows medicines and IV fluids to be quickly distributed throughout the body. The PICC is inserted using a sterile technique by a specially trained nurse or physician. After the PICC is inserted, a chest X-ray exam is done to be sure it is in the correct place.  A PICC may be placed for different reasons, such as:  To give medicines and liquid nutrition that can only be given through a central line. Examples are:  Certain antibiotic treatments.  Chemotherapy.  Total parenteral nutrition (TPN).  To take frequent blood samples.  To give IV fluids and blood products.  If there is difficulty placing a peripheral intravenous (PIV) catheter. If taken care of properly, a PICC can remain in place for several months. A PICC can also allow a person to go home from the hospital early. Medicine and PICC care can be managed at home by a family member or home health care team. WHAT PROBLEMS CAN HAPPEN WHEN I HAVE A PICC? Problems with a PICC can occasionally occur. These may include the following:  A blood clot (thrombus) forming in or at the tip of the PICC. This can cause the PICC to become clogged. A clot-dissolving medicine called tissue plasminogen activator (tPA) can be given through the PICC to help break up the clot.  Inflammation of the vein (phlebitis) in which the PICC is placed. Signs of inflammation may include redness, pain at the insertion site, red streaks, or being able to feel a "cord" in the vein where the PICC is located.  Infection in the PICC or at the insertion  site. Signs of infection may include fever, chills, redness, swelling, or pus drainage from the PICC insertion site.  PICC movement (malposition). The PICC tip may move from its original position due to excessive physical activity, forceful coughing, sneezing, or vomiting.  A break or cut in the PICC. It is important to not use scissors near the PICC.  Nerve or tendon irritation or injury during PICC insertion. WHAT SHOULD I KEEP IN MIND ABOUT ACTIVITIES WHEN I HAVE A PICC?  You may bend your arm and move it freely. If your PICC is near or at the bend of your elbow, avoid activity with repeated motion at the elbow.  Rest at home for the remainder of the day following PICC line insertion.  Avoid lifting heavy objects as instructed by your health care provider.  Avoid using a crutch with the arm on the same side as your PICC. You may need to use a walker. WHAT SHOULD I KNOW ABOUT MY PICC DRESSING?  Keep your PICC bandage (dressing) clean and dry to prevent infection.  Ask your health care provider when you may shower. Ask your health care provider to teach you how to wrap the PICC when you do take a shower.  Change the PICC dressing as instructed by your health care provider.  Change your PICC dressing if it becomes loose or wet. WHAT SHOULD I KNOW ABOUT PICC CARE?  Check the PICC insertion site   daily for leakage, redness, swelling, or pain.  Do not take a bath, swim, or use hot tubs when you have a PICC. Cover PICC line with clear plastic wrap and tape to keep it dry while showering.  Flush the PICC as directed by your health care provider. Let your health care provider know right away if the PICC is difficult to flush or does not flush. Do not use force to flush the PICC.  Do not use a syringe that is less than 10 mL to flush the PICC.  Never pull or tug on the PICC.  Avoid blood pressure checks on the arm with the PICC.  Keep your PICC identification card with you at all  times.  Do not take the PICC out yourself. Only a trained clinical professional should remove the PICC. SEEK IMMEDIATE MEDICAL CARE IF:  Your PICC is accidentally pulled all the way out. If this happens, cover the insertion site with a bandage or gauze dressing. Do not throw the PICC away. Your health care provider will need to inspect it.  Your PICC was tugged or pulled and has partially come out. Do not  push the PICC back in.  There is any type of drainage, redness, or swelling where the PICC enters the skin.  You cannot flush the PICC, it is difficult to flush, or the PICC leaks around the insertion site when it is flushed.  You hear a "flushing" sound when the PICC is flushed.  You have pain, discomfort, or numbness in your arm, shoulder, or jaw on the same side as the PICC.  You feel your heart "racing" or skipping beats.  You notice a hole or tear in the PICC.  You develop chills or a fever. MAKE SURE YOU:   Understand these instructions.  Will watch your condition.  Will get help right away if you are not doing well or get worse. Document Released: 07/20/2002 Document Revised: 05/30/2013 Document Reviewed: 09/20/2012 ExitCare Patient Information 2015 [employer]. This information is not intended to replace advice given to you by your health care provider. Make sure you discuss any questions you have with your health care provider.  

## 2014-09-01 ENCOUNTER — Other Ambulatory Visit (HOSPITAL_BASED_OUTPATIENT_CLINIC_OR_DEPARTMENT_OTHER): Payer: Managed Care, Other (non HMO)

## 2014-09-01 ENCOUNTER — Ambulatory Visit (HOSPITAL_BASED_OUTPATIENT_CLINIC_OR_DEPARTMENT_OTHER): Payer: Managed Care, Other (non HMO)

## 2014-09-01 ENCOUNTER — Telehealth: Payer: Self-pay | Admitting: Oncology

## 2014-09-01 ENCOUNTER — Other Ambulatory Visit: Payer: Self-pay

## 2014-09-01 ENCOUNTER — Ambulatory Visit: Payer: Managed Care, Other (non HMO)

## 2014-09-01 VITALS — BP 145/88 | HR 95 | Temp 98.7°F | Resp 18

## 2014-09-01 DIAGNOSIS — Z5111 Encounter for antineoplastic chemotherapy: Secondary | ICD-10-CM

## 2014-09-01 DIAGNOSIS — C562 Malignant neoplasm of left ovary: Secondary | ICD-10-CM

## 2014-09-01 DIAGNOSIS — Z5189 Encounter for other specified aftercare: Secondary | ICD-10-CM | POA: Diagnosis not present

## 2014-09-01 DIAGNOSIS — C563 Malignant neoplasm of bilateral ovaries: Secondary | ICD-10-CM

## 2014-09-01 DIAGNOSIS — Z452 Encounter for adjustment and management of vascular access device: Secondary | ICD-10-CM

## 2014-09-01 DIAGNOSIS — C561 Malignant neoplasm of right ovary: Secondary | ICD-10-CM

## 2014-09-01 LAB — CBC WITH DIFFERENTIAL/PLATELET
BASO%: 0.2 % (ref 0.0–2.0)
Basophils Absolute: 0 10*3/uL (ref 0.0–0.1)
EOS%: 0 % (ref 0.0–7.0)
Eosinophils Absolute: 0 10*3/uL (ref 0.0–0.5)
HCT: 29.9 % — ABNORMAL LOW (ref 34.8–46.6)
HGB: 10.3 g/dL — ABNORMAL LOW (ref 11.6–15.9)
LYMPH%: 7.9 % — ABNORMAL LOW (ref 14.0–49.7)
MCH: 32.8 pg (ref 25.1–34.0)
MCHC: 34.5 g/dL (ref 31.5–36.0)
MCV: 95.2 fL (ref 79.5–101.0)
MONO#: 0 10*3/uL — AB (ref 0.1–0.9)
MONO%: 0.5 % (ref 0.0–14.0)
NEUT%: 91.4 % — ABNORMAL HIGH (ref 38.4–76.8)
NEUTROS ABS: 5.3 10*3/uL (ref 1.5–6.5)
Platelets: 176 10*3/uL (ref 145–400)
RBC: 3.14 10*6/uL — ABNORMAL LOW (ref 3.70–5.45)
RDW: 20.4 % — ABNORMAL HIGH (ref 11.2–14.5)
WBC: 5.8 10*3/uL (ref 3.9–10.3)
lymph#: 0.5 10*3/uL — ABNORMAL LOW (ref 0.9–3.3)

## 2014-09-01 MED ORDER — PEGFILGRASTIM 6 MG/0.6ML ~~LOC~~ PSKT
6.0000 mg | PREFILLED_SYRINGE | Freq: Once | SUBCUTANEOUS | Status: AC
  Administered 2014-09-01: 6 mg via SUBCUTANEOUS
  Filled 2014-09-01: qty 0.6

## 2014-09-01 MED ORDER — SODIUM CHLORIDE 0.9 % IJ SOLN
10.0000 mL | INTRAMUSCULAR | Status: DC | PRN
  Administered 2014-09-01: 10 mL
  Filled 2014-09-01: qty 10

## 2014-09-01 MED ORDER — HEPARIN SOD (PORK) LOCK FLUSH 100 UNIT/ML IV SOLN
250.0000 [IU] | Freq: Once | INTRAVENOUS | Status: DC
  Filled 2014-09-01: qty 5

## 2014-09-01 MED ORDER — SODIUM CHLORIDE 0.9 % IV SOLN
Freq: Once | INTRAVENOUS | Status: AC
  Administered 2014-09-01: 12:00:00 via INTRAVENOUS
  Filled 2014-09-01: qty 8

## 2014-09-01 MED ORDER — SODIUM CHLORIDE 0.9 % IV SOLN
250.0000 mL | Freq: Once | INTRAVENOUS | Status: AC
  Administered 2014-09-01: 250 mL via INTRAVENOUS

## 2014-09-01 MED ORDER — DIPHENHYDRAMINE HCL 50 MG/ML IJ SOLN
50.0000 mg | Freq: Once | INTRAMUSCULAR | Status: AC
  Administered 2014-09-01: 50 mg via INTRAVENOUS

## 2014-09-01 MED ORDER — HEPARIN SOD (PORK) LOCK FLUSH 100 UNIT/ML IV SOLN
250.0000 [IU] | Freq: Once | INTRAVENOUS | Status: AC | PRN
  Administered 2014-09-01: 250 [IU]
  Filled 2014-09-01: qty 5

## 2014-09-01 MED ORDER — PACLITAXEL CHEMO INJECTION 300 MG/50ML
135.0000 mg/m2 | Freq: Once | INTRAVENOUS | Status: AC
  Administered 2014-09-01: 270 mg via INTRAVENOUS
  Filled 2014-09-01: qty 45

## 2014-09-01 MED ORDER — SODIUM CHLORIDE 0.9 % IJ SOLN
10.0000 mL | INTRAMUSCULAR | Status: DC | PRN
  Administered 2014-09-01: 10 mL via INTRAVENOUS
  Filled 2014-09-01: qty 10

## 2014-09-01 MED ORDER — FAMOTIDINE IN NACL 20-0.9 MG/50ML-% IV SOLN
INTRAVENOUS | Status: AC
  Filled 2014-09-01: qty 50

## 2014-09-01 MED ORDER — HEPARIN SOD (PORK) LOCK FLUSH 100 UNIT/ML IV SOLN
250.0000 [IU] | Freq: Once | INTRAVENOUS | Status: AC
  Administered 2014-09-01: 250 [IU] via INTRAVENOUS
  Filled 2014-09-01: qty 5

## 2014-09-01 MED ORDER — SODIUM CHLORIDE 0.9 % IV SOLN
453.6000 mg | Freq: Once | INTRAVENOUS | Status: AC
  Administered 2014-09-01: 450 mg via INTRAVENOUS
  Filled 2014-09-01: qty 45

## 2014-09-01 MED ORDER — DIPHENHYDRAMINE HCL 50 MG/ML IJ SOLN
INTRAMUSCULAR | Status: AC
Start: 2014-09-01 — End: 2014-09-01
  Filled 2014-09-01: qty 1

## 2014-09-01 MED ORDER — FAMOTIDINE IN NACL 20-0.9 MG/50ML-% IV SOLN
20.0000 mg | Freq: Once | INTRAVENOUS | Status: AC
  Administered 2014-09-01: 20 mg via INTRAVENOUS

## 2014-09-01 NOTE — Patient Instructions (Signed)
[facility] Discharge Instructions for Patients Receiving Chemotherapy  Today you received the following chemotherapy agents: Taxol, Carboplatin   To help prevent nausea and vomiting after your treatment, we encourage you to take your nausea medication as directed.    If you develop nausea and vomiting that is not controlled by your nausea medication, call the clinic.   BELOW ARE SYMPTOMS THAT SHOULD BE REPORTED IMMEDIATELY:  *FEVER GREATER THAN 100.5 F  *CHILLS WITH OR WITHOUT FEVER  NAUSEA AND VOMITING THAT IS NOT CONTROLLED WITH YOUR NAUSEA MEDICATION  *UNUSUAL SHORTNESS OF BREATH  *UNUSUAL BRUISING OR BLEEDING  TENDERNESS IN MOUTH AND THROAT WITH OR WITHOUT PRESENCE OF ULCERS  *URINARY PROBLEMS  *BOWEL PROBLEMS  UNUSUAL RASH Items with * indicate a potential emergency and should be followed up as soon as possible.  Feel free to call the clinic you have any questions or concerns. The clinic phone number is [phone].  Please show the [place] at check-in to the Emergency Department and triage nurse.   Pegfilgrastim injection What is this medicine? PEGFILGRASTIM (peg fil GRA stim) is a long-acting granulocyte colony-stimulating factor that stimulates the growth of neutrophils, a type of white blood cell important in the body's fight against infection. It is used to reduce the incidence of fever and infection in patients with certain types of cancer who are receiving chemotherapy that affects the bone marrow. This medicine may be used for other purposes; ask your health care provider or pharmacist if you have questions. COMMON BRAND NAME(S): Neulasta What should I tell my health care provider before I take this medicine? They need to know if you have any of these conditions: -latex allergy -ongoing radiation therapy -sickle cell disease -skin reactions to acrylic adhesives (On-Body Injector only) -an unusual or allergic reaction to  pegfilgrastim, filgrastim, other medicines, foods, dyes, or preservatives -pregnant or trying to get pregnant -breast-feeding How should I use this medicine? This medicine is for injection under the skin. If you get this medicine at home, you will be taught how to prepare and give the pre-filled syringe or how to use the On-body Injector. Refer to the patient Instructions for Use for detailed instructions. Use exactly as directed. Take your medicine at regular intervals. Do not take your medicine more often than directed. It is important that you put your used needles and syringes in a special sharps container. Do not put them in a trash can. If you do not have a sharps container, call your pharmacist or healthcare provider to get one. Talk to your pediatrician regarding the use of this medicine in children. Special care may be needed. Overdosage: If you think you have taken too much of this medicine contact a poison control center or emergency room at once. NOTE: This medicine is only for you. Do not share this medicine with others. What if I miss a dose? It is important not to miss your dose. Call your doctor or health care professional if you miss your dose. If you miss a dose due to an On-body Injector failure or leakage, a new dose should be administered as soon as possible using a single prefilled syringe for manual use. What may interact with this medicine? Interactions have not been studied. Give your health care provider a list of all the medicines, herbs, non-prescription drugs, or dietary supplements you use. Also tell them if you smoke, drink alcohol, or use illegal drugs. Some items may interact with your medicine. This list may  not describe all possible interactions. Give your health care provider a list of all the medicines, herbs, non-prescription drugs, or dietary supplements you use. Also tell them if you smoke, drink alcohol, or use illegal drugs. Some items may interact with your  medicine. What should I watch for while using this medicine? You may need blood work done while you are taking this medicine. If you are going to need a MRI, CT scan, or other procedure, tell your doctor that you are using this medicine (On-Body Injector only). What side effects may I notice from receiving this medicine? Side effects that you should report to your doctor or health care professional as soon as possible: -allergic reactions like skin rash, itching or hives, swelling of the face, lips, or tongue -dizziness -fever -pain, redness, or irritation at site where injected -pinpoint red spots on the skin -shortness of breath or breathing problems -stomach or side pain, or pain at the shoulder -swelling -tiredness -trouble passing urine Side effects that usually do not require medical attention (report to your doctor or health care professional if they continue or are bothersome): -bone pain -muscle pain This list may not describe all possible side effects. Call your doctor for medical advice about side effects. You may report side effects to FDA at 1-800-FDA-1088. Where should I keep my medicine? Keep out of the reach of children. Store pre-filled syringes in a refrigerator between 2 and 8 degrees C (36 and 46 degrees F). Do not freeze. Keep in carton to protect from light. Throw away this medicine if it is left out of the refrigerator for more than 48 hours. Throw away any unused medicine after the expiration date. NOTE: This sheet is a summary. It may not cover all possible information. If you have questions about this medicine, talk to your doctor, pharmacist, or health care provider.  2015, Elsevier/Gold Standard. (2013-04-14 16:14:05)

## 2014-09-01 NOTE — Telephone Encounter (Signed)
Patient called in to cancel her ivf on 8/8 as she feels she does not need it  [place]

## 2014-09-04 ENCOUNTER — Ambulatory Visit: Payer: Self-pay

## 2014-09-05 ENCOUNTER — Other Ambulatory Visit (HOSPITAL_BASED_OUTPATIENT_CLINIC_OR_DEPARTMENT_OTHER): Payer: Managed Care, Other (non HMO)

## 2014-09-05 ENCOUNTER — Ambulatory Visit (HOSPITAL_BASED_OUTPATIENT_CLINIC_OR_DEPARTMENT_OTHER): Payer: Managed Care, Other (non HMO)

## 2014-09-05 VITALS — BP 134/53 | HR 98 | Temp 99.1°F | Resp 16

## 2014-09-05 DIAGNOSIS — C562 Malignant neoplasm of left ovary: Secondary | ICD-10-CM | POA: Diagnosis not present

## 2014-09-05 DIAGNOSIS — Z452 Encounter for adjustment and management of vascular access device: Secondary | ICD-10-CM

## 2014-09-05 DIAGNOSIS — C561 Malignant neoplasm of right ovary: Secondary | ICD-10-CM

## 2014-09-05 DIAGNOSIS — C563 Malignant neoplasm of bilateral ovaries: Secondary | ICD-10-CM

## 2014-09-05 DIAGNOSIS — R1900 Intra-abdominal and pelvic swelling, mass and lump, unspecified site: Secondary | ICD-10-CM

## 2014-09-05 LAB — CBC WITH DIFFERENTIAL/PLATELET
BASO%: 0.6 % (ref 0.0–2.0)
Basophils Absolute: 0.2 10*3/uL — ABNORMAL HIGH (ref 0.0–0.1)
EOS%: 0.6 % (ref 0.0–7.0)
Eosinophils Absolute: 0.2 10*3/uL (ref 0.0–0.5)
HCT: 28.4 % — ABNORMAL LOW (ref 34.8–46.6)
HGB: 9.5 g/dL — ABNORMAL LOW (ref 11.6–15.9)
LYMPH%: 5.7 % — ABNORMAL LOW (ref 14.0–49.7)
MCH: 32.8 pg (ref 25.1–34.0)
MCHC: 33.4 g/dL (ref 31.5–36.0)
MCV: 98.4 fL (ref 79.5–101.0)
MONO#: 0.3 10*3/uL (ref 0.1–0.9)
MONO%: 1.1 % (ref 0.0–14.0)
NEUT%: 92 % — AB (ref 38.4–76.8)
NEUTROS ABS: 28.4 10*3/uL — AB (ref 1.5–6.5)
Platelets: 103 10*3/uL — ABNORMAL LOW (ref 145–400)
RBC: 2.88 10*6/uL — ABNORMAL LOW (ref 3.70–5.45)
RDW: 19.8 % — ABNORMAL HIGH (ref 11.2–14.5)
WBC: 30.8 10*3/uL — AB (ref 3.9–10.3)
lymph#: 1.8 10*3/uL (ref 0.9–3.3)

## 2014-09-05 MED ORDER — HEPARIN SOD (PORK) LOCK FLUSH 100 UNIT/ML IV SOLN
500.0000 [IU] | Freq: Once | INTRAVENOUS | Status: AC
  Administered 2014-09-05: 500 [IU] via INTRAVENOUS
  Filled 2014-09-05: qty 5

## 2014-09-05 MED ORDER — SODIUM CHLORIDE 0.9 % IJ SOLN
10.0000 mL | INTRAMUSCULAR | Status: DC | PRN
  Administered 2014-09-05: 10 mL via INTRAVENOUS
  Filled 2014-09-05: qty 10

## 2014-09-05 NOTE — Patient Instructions (Signed)
PICC Home Guide A peripherally inserted central catheter (PICC) is a long, thin, flexible tube that is inserted into a vein in the upper arm. It is a form of intravenous (IV) access. It is considered to be a "central" line because the tip of the PICC ends in a large vein in your chest. This large vein is called the superior vena cava (SVC). The PICC tip ends in the SVC because there is a lot of blood flow in the SVC. This allows medicines and IV fluids to be quickly distributed throughout the body. The PICC is inserted using a sterile technique by a specially trained nurse or physician. After the PICC is inserted, a chest X-ray exam is done to be sure it is in the correct place.  A PICC may be placed for different reasons, such as:  To give medicines and liquid nutrition that can only be given through a central line. Examples are:  Certain antibiotic treatments.  Chemotherapy.  Total parenteral nutrition (TPN).  To take frequent blood samples.  To give IV fluids and blood products.  If there is difficulty placing a peripheral intravenous (PIV) catheter. If taken care of properly, a PICC can remain in place for several months. A PICC can also allow a person to go home from the hospital early. Medicine and PICC care can be managed at home by a family member or home health care team. WHAT PROBLEMS CAN HAPPEN WHEN I HAVE A PICC? Problems with a PICC can occasionally occur. These may include the following:  A blood clot (thrombus) forming in or at the tip of the PICC. This can cause the PICC to become clogged. A clot-dissolving medicine called tissue plasminogen activator (tPA) can be given through the PICC to help break up the clot.  Inflammation of the vein (phlebitis) in which the PICC is placed. Signs of inflammation may include redness, pain at the insertion site, red streaks, or being able to feel a "cord" in the vein where the PICC is located.  Infection in the PICC or at the insertion  site. Signs of infection may include fever, chills, redness, swelling, or pus drainage from the PICC insertion site.  PICC movement (malposition). The PICC tip may move from its original position due to excessive physical activity, forceful coughing, sneezing, or vomiting.  A break or cut in the PICC. It is important to not use scissors near the PICC.  Nerve or tendon irritation or injury during PICC insertion. WHAT SHOULD I KEEP IN MIND ABOUT ACTIVITIES WHEN I HAVE A PICC?  You may bend your arm and move it freely. If your PICC is near or at the bend of your elbow, avoid activity with repeated motion at the elbow.  Rest at home for the remainder of the day following PICC line insertion.  Avoid lifting heavy objects as instructed by your health care provider.  Avoid using a crutch with the arm on the same side as your PICC. You may need to use a walker. WHAT SHOULD I KNOW ABOUT MY PICC DRESSING?  Keep your PICC bandage (dressing) clean and dry to prevent infection.  Ask your health care provider when you may shower. Ask your health care provider to teach you how to wrap the PICC when you do take a shower.  Change the PICC dressing as instructed by your health care provider.  Change your PICC dressing if it becomes loose or wet. WHAT SHOULD I KNOW ABOUT PICC CARE?  Check the PICC insertion site   daily for leakage, redness, swelling, or pain.  Do not take a bath, swim, or use hot tubs when you have a PICC. Cover PICC line with clear plastic wrap and tape to keep it dry while showering.  Flush the PICC as directed by your health care provider. Let your health care provider know right away if the PICC is difficult to flush or does not flush. Do not use force to flush the PICC.  Do not use a syringe that is less than 10 mL to flush the PICC.  Never pull or tug on the PICC.  Avoid blood pressure checks on the arm with the PICC.  Keep your PICC identification card with you at all  times.  Do not take the PICC out yourself. Only a trained clinical professional should remove the PICC. SEEK IMMEDIATE MEDICAL CARE IF:  Your PICC is accidentally pulled all the way out. If this happens, cover the insertion site with a bandage or gauze dressing. Do not throw the PICC away. Your health care provider will need to inspect it.  Your PICC was tugged or pulled and has partially come out. Do not  push the PICC back in.  There is any type of drainage, redness, or swelling where the PICC enters the skin.  You cannot flush the PICC, it is difficult to flush, or the PICC leaks around the insertion site when it is flushed.  You hear a "flushing" sound when the PICC is flushed.  You have pain, discomfort, or numbness in your arm, shoulder, or jaw on the same side as the PICC.  You feel your heart "racing" or skipping beats.  You notice a hole or tear in the PICC.  You develop chills or a fever. MAKE SURE YOU:   Understand these instructions.  Will watch your condition.  Will get help right away if you are not doing well or get worse. Document Released: 07/20/2002 Document Revised: 05/30/2013 Document Reviewed: 09/20/2012 ExitCare Patient Information 2015 [employer]. This information is not intended to replace advice given to you by your health care provider. Make sure you discuss any questions you have with your health care provider.  

## 2014-09-07 ENCOUNTER — Other Ambulatory Visit: Payer: Self-pay

## 2014-09-07 DIAGNOSIS — C563 Malignant neoplasm of bilateral ovaries: Secondary | ICD-10-CM

## 2014-09-07 DIAGNOSIS — C561 Malignant neoplasm of right ovary: Secondary | ICD-10-CM

## 2014-09-07 DIAGNOSIS — C562 Malignant neoplasm of left ovary: Principal | ICD-10-CM

## 2014-09-08 ENCOUNTER — Other Ambulatory Visit: Payer: Self-pay

## 2014-09-08 ENCOUNTER — Ambulatory Visit (HOSPITAL_BASED_OUTPATIENT_CLINIC_OR_DEPARTMENT_OTHER): Payer: Managed Care, Other (non HMO)

## 2014-09-08 DIAGNOSIS — C562 Malignant neoplasm of left ovary: Secondary | ICD-10-CM | POA: Diagnosis not present

## 2014-09-08 DIAGNOSIS — C561 Malignant neoplasm of right ovary: Secondary | ICD-10-CM | POA: Diagnosis not present

## 2014-09-08 DIAGNOSIS — Z452 Encounter for adjustment and management of vascular access device: Secondary | ICD-10-CM

## 2014-09-08 MED ORDER — SODIUM CHLORIDE 0.9 % IJ SOLN
10.0000 mL | INTRAMUSCULAR | Status: DC | PRN
  Administered 2014-09-08: 10 mL via INTRAVENOUS
  Filled 2014-09-08: qty 10

## 2014-09-08 MED ORDER — HEPARIN SOD (PORK) LOCK FLUSH 100 UNIT/ML IV SOLN
500.0000 [IU] | Freq: Once | INTRAVENOUS | Status: AC
  Administered 2014-09-08: 500 [IU] via INTRAVENOUS
  Filled 2014-09-08: qty 5

## 2014-09-08 NOTE — Patient Instructions (Signed)
PICC Home Guide A peripherally inserted central catheter (PICC) is a long, thin, flexible tube that is inserted into a vein in the upper arm. It is a form of intravenous (IV) access. It is considered to be a "central" line because the tip of the PICC ends in a large vein in your chest. This large vein is called the superior vena cava (SVC). The PICC tip ends in the SVC because there is a lot of blood flow in the SVC. This allows medicines and IV fluids to be quickly distributed throughout the body. The PICC is inserted using a sterile technique by a specially trained nurse or physician. After the PICC is inserted, a chest X-ray exam is done to be sure it is in the correct place.  A PICC may be placed for different reasons, such as:  To give medicines and liquid nutrition that can only be given through a central line. Examples are:  Certain antibiotic treatments.  Chemotherapy.  Total parenteral nutrition (TPN).  To take frequent blood samples.  To give IV fluids and blood products.  If there is difficulty placing a peripheral intravenous (PIV) catheter. If taken care of properly, a PICC can remain in place for several months. A PICC can also allow a person to go home from the hospital early. Medicine and PICC care can be managed at home by a family member or home health care team. WHAT PROBLEMS CAN HAPPEN WHEN I HAVE A PICC? Problems with a PICC can occasionally occur. These may include the following:  A blood clot (thrombus) forming in or at the tip of the PICC. This can cause the PICC to become clogged. A clot-dissolving medicine called tissue plasminogen activator (tPA) can be given through the PICC to help break up the clot.  Inflammation of the vein (phlebitis) in which the PICC is placed. Signs of inflammation may include redness, pain at the insertion site, red streaks, or being able to feel a "cord" in the vein where the PICC is located.  Infection in the PICC or at the insertion  site. Signs of infection may include fever, chills, redness, swelling, or pus drainage from the PICC insertion site.  PICC movement (malposition). The PICC tip may move from its original position due to excessive physical activity, forceful coughing, sneezing, or vomiting.  A break or cut in the PICC. It is important to not use scissors near the PICC.  Nerve or tendon irritation or injury during PICC insertion. WHAT SHOULD I KEEP IN MIND ABOUT ACTIVITIES WHEN I HAVE A PICC?  You may bend your arm and move it freely. If your PICC is near or at the bend of your elbow, avoid activity with repeated motion at the elbow.  Rest at home for the remainder of the day following PICC line insertion.  Avoid lifting heavy objects as instructed by your health care provider.  Avoid using a crutch with the arm on the same side as your PICC. You may need to use a walker. WHAT SHOULD I KNOW ABOUT MY PICC DRESSING?  Keep your PICC bandage (dressing) clean and dry to prevent infection.  Ask your health care provider when you may shower. Ask your health care provider to teach you how to wrap the PICC when you do take a shower.  Change the PICC dressing as instructed by your health care provider.  Change your PICC dressing if it becomes loose or wet. WHAT SHOULD I KNOW ABOUT PICC CARE?  Check the PICC insertion site   daily for leakage, redness, swelling, or pain.  Do not take a bath, swim, or use hot tubs when you have a PICC. Cover PICC line with clear plastic wrap and tape to keep it dry while showering.  Flush the PICC as directed by your health care provider. Let your health care provider know right away if the PICC is difficult to flush or does not flush. Do not use force to flush the PICC.  Do not use a syringe that is less than 10 mL to flush the PICC.  Never pull or tug on the PICC.  Avoid blood pressure checks on the arm with the PICC.  Keep your PICC identification card with you at all  times.  Do not take the PICC out yourself. Only a trained clinical professional should remove the PICC. SEEK IMMEDIATE MEDICAL CARE IF:  Your PICC is accidentally pulled all the way out. If this happens, cover the insertion site with a bandage or gauze dressing. Do not throw the PICC away. Your health care provider will need to inspect it.  Your PICC was tugged or pulled and has partially come out. Do not  push the PICC back in.  There is any type of drainage, redness, or swelling where the PICC enters the skin.  You cannot flush the PICC, it is difficult to flush, or the PICC leaks around the insertion site when it is flushed.  You hear a "flushing" sound when the PICC is flushed.  You have pain, discomfort, or numbness in your arm, shoulder, or jaw on the same side as the PICC.  You feel your heart "racing" or skipping beats.  You notice a hole or tear in the PICC.  You develop chills or a fever. MAKE SURE YOU:   Understand these instructions.  Will watch your condition.  Will get help right away if you are not doing well or get worse. Document Released: 07/20/2002 Document Revised: 05/30/2013 Document Reviewed: 09/20/2012 ExitCare Patient Information 2015 [employer]. This information is not intended to replace advice given to you by your health care provider. Make sure you discuss any questions you have with your health care provider.  

## 2014-09-12 ENCOUNTER — Ambulatory Visit (HOSPITAL_BASED_OUTPATIENT_CLINIC_OR_DEPARTMENT_OTHER): Payer: Managed Care, Other (non HMO)

## 2014-09-12 ENCOUNTER — Other Ambulatory Visit: Payer: Self-pay

## 2014-09-12 DIAGNOSIS — C562 Malignant neoplasm of left ovary: Secondary | ICD-10-CM | POA: Diagnosis not present

## 2014-09-12 DIAGNOSIS — C569 Malignant neoplasm of unspecified ovary: Secondary | ICD-10-CM

## 2014-09-12 DIAGNOSIS — C561 Malignant neoplasm of right ovary: Secondary | ICD-10-CM | POA: Diagnosis not present

## 2014-09-12 DIAGNOSIS — Z452 Encounter for adjustment and management of vascular access device: Secondary | ICD-10-CM | POA: Diagnosis not present

## 2014-09-12 MED ORDER — SODIUM CHLORIDE 0.9 % IJ SOLN
10.0000 mL | INTRAMUSCULAR | Status: AC | PRN
  Administered 2014-09-12: 10 mL
  Filled 2014-09-12: qty 10

## 2014-09-12 MED ORDER — HEPARIN SOD (PORK) LOCK FLUSH 100 UNIT/ML IV SOLN
250.0000 [IU] | INTRAVENOUS | Status: AC | PRN
  Administered 2014-09-12: 250 [IU]
  Filled 2014-09-12: qty 5

## 2014-09-12 NOTE — Patient Instructions (Signed)

## 2014-09-13 ENCOUNTER — Other Ambulatory Visit: Payer: Self-pay | Admitting: *Deleted

## 2014-09-14 ENCOUNTER — Ambulatory Visit: Payer: Managed Care, Other (non HMO)

## 2014-09-14 ENCOUNTER — Other Ambulatory Visit: Payer: Self-pay

## 2014-09-14 ENCOUNTER — Other Ambulatory Visit (HOSPITAL_BASED_OUTPATIENT_CLINIC_OR_DEPARTMENT_OTHER): Payer: Managed Care, Other (non HMO)

## 2014-09-14 ENCOUNTER — Other Ambulatory Visit: Payer: Self-pay | Admitting: *Deleted

## 2014-09-14 ENCOUNTER — Ambulatory Visit (HOSPITAL_BASED_OUTPATIENT_CLINIC_OR_DEPARTMENT_OTHER): Payer: Managed Care, Other (non HMO) | Admitting: Oncology

## 2014-09-14 ENCOUNTER — Ambulatory Visit (HOSPITAL_COMMUNITY)
Admission: RE | Admit: 2014-09-14 | Discharge: 2014-09-14 | Disposition: A | Discharge status: Home / Self Care | Payer: Managed Care, Other (non HMO) | Source: Ambulatory Visit | Attending: Oncology | Admitting: Oncology

## 2014-09-14 ENCOUNTER — Encounter: Payer: Self-pay | Admitting: Oncology

## 2014-09-14 ENCOUNTER — Other Ambulatory Visit: Payer: Self-pay | Admitting: Oncology

## 2014-09-14 ENCOUNTER — Encounter (HOSPITAL_COMMUNITY): Payer: Self-pay

## 2014-09-14 ENCOUNTER — Ambulatory Visit (HOSPITAL_BASED_OUTPATIENT_CLINIC_OR_DEPARTMENT_OTHER): Payer: Managed Care, Other (non HMO)

## 2014-09-14 VITALS — BP 127/76 | HR 84 | Temp 98.4°F | Resp 16 | Ht 64.0 in | Wt 202.6 lb

## 2014-09-14 DIAGNOSIS — Z9221 Personal history of antineoplastic chemotherapy: Secondary | ICD-10-CM | POA: Diagnosis not present

## 2014-09-14 DIAGNOSIS — C562 Malignant neoplasm of left ovary: Secondary | ICD-10-CM | POA: Diagnosis not present

## 2014-09-14 DIAGNOSIS — G620 Drug-induced polyneuropathy: Secondary | ICD-10-CM

## 2014-09-14 DIAGNOSIS — C563 Malignant neoplasm of bilateral ovaries: Secondary | ICD-10-CM

## 2014-09-14 DIAGNOSIS — C561 Malignant neoplasm of right ovary: Secondary | ICD-10-CM

## 2014-09-14 DIAGNOSIS — D701 Agranulocytosis secondary to cancer chemotherapy: Secondary | ICD-10-CM

## 2014-09-14 DIAGNOSIS — X58XXXA Exposure to other specified factors, initial encounter: Secondary | ICD-10-CM | POA: Insufficient documentation

## 2014-09-14 DIAGNOSIS — Z9889 Other specified postprocedural states: Secondary | ICD-10-CM | POA: Diagnosis not present

## 2014-09-14 DIAGNOSIS — D6481 Anemia due to antineoplastic chemotherapy: Secondary | ICD-10-CM | POA: Diagnosis present

## 2014-09-14 DIAGNOSIS — G622 Polyneuropathy due to other toxic agents: Secondary | ICD-10-CM | POA: Diagnosis not present

## 2014-09-14 DIAGNOSIS — D6959 Other secondary thrombocytopenia: Secondary | ICD-10-CM

## 2014-09-14 DIAGNOSIS — T451X5A Adverse effect of antineoplastic and immunosuppressive drugs, initial encounter: Secondary | ICD-10-CM

## 2014-09-14 DIAGNOSIS — Z452 Encounter for adjustment and management of vascular access device: Secondary | ICD-10-CM

## 2014-09-14 LAB — COMPREHENSIVE METABOLIC PANEL (CC13)
ALBUMIN: 3.9 g/dL (ref 3.5–5.0)
ALK PHOS: 83 U/L (ref 40–150)
ALT: 17 U/L (ref 0–55)
AST: 13 U/L (ref 5–34)
Anion Gap: 10 mEq/L (ref 3–11)
BILIRUBIN TOTAL: 0.45 mg/dL (ref 0.20–1.20)
BUN: 16.2 mg/dL (ref 7.0–26.0)
CALCIUM: 9.5 mg/dL (ref 8.4–10.4)
CO2: 23 mEq/L (ref 22–29)
Chloride: 108 mEq/L (ref 98–109)
Creatinine: 1.1 mg/dL (ref 0.6–1.1)
EGFR: 63 mL/min/{1.73_m2} — ABNORMAL LOW (ref >90)
Glucose: 84 mg/dl (ref 70–140)
POTASSIUM: 4.6 meq/L (ref 3.5–5.1)
Sodium: 141 mEq/L (ref 136–145)
TOTAL PROTEIN: 6.5 g/dL (ref 6.4–8.3)

## 2014-09-14 LAB — CBC WITH DIFFERENTIAL/PLATELET
BASO%: 0.2 % (ref 0.0–2.0)
Basophils Absolute: 0 10*3/uL (ref 0.0–0.1)
EOS ABS: 0 10*3/uL (ref 0.0–0.5)
EOS%: 0.8 % (ref 0.0–7.0)
HEMATOCRIT: 22.7 % — AB (ref 34.8–46.6)
HEMOGLOBIN: 8 g/dL — AB (ref 11.6–15.9)
LYMPH#: 2.3 10*3/uL (ref 0.9–3.3)
LYMPH%: 45.7 % (ref 14.0–49.7)
MCH: 33.6 pg (ref 25.1–34.0)
MCHC: 35.2 g/dL (ref 31.5–36.0)
MCV: 95.4 fL (ref 79.5–101.0)
MONO#: 0.4 10*3/uL (ref 0.1–0.9)
MONO%: 7.5 % (ref 0.0–14.0)
NEUT#: 2.3 10*3/uL (ref 1.5–6.5)
NEUT%: 45.8 % (ref 38.4–76.8)
Platelets: 60 10*3/uL — ABNORMAL LOW (ref 145–400)
RBC: 2.38 10*6/uL — ABNORMAL LOW (ref 3.70–5.45)
RDW: 17.9 % — AB (ref 11.2–14.5)
WBC: 5 10*3/uL (ref 3.9–10.3)

## 2014-09-14 LAB — HOLD TUBE, BLOOD BANK

## 2014-09-14 MED ORDER — HEPARIN SOD (PORK) LOCK FLUSH 100 UNIT/ML IV SOLN
500.0000 [IU] | Freq: Once | INTRAVENOUS | Status: AC
  Administered 2014-09-14: 250 [IU] via INTRAVENOUS
  Filled 2014-09-14: qty 5

## 2014-09-14 MED ORDER — SODIUM CHLORIDE 0.9 % IJ SOLN
10.0000 mL | INTRAMUSCULAR | Status: DC | PRN
  Administered 2014-09-14: 10 mL via INTRAVENOUS
  Filled 2014-09-14: qty 10

## 2014-09-14 MED ORDER — IOHEXOL 300 MG/ML  SOLN
100.0000 mL | Freq: Once | INTRAMUSCULAR | Status: AC | PRN
  Administered 2014-09-14: 100 mL via INTRAVENOUS

## 2014-09-14 NOTE — Progress Notes (Signed)
Pt PICC dressing change done, and type and hold. Flushed lumen #1 with saline and heparin.

## 2014-09-14 NOTE — Progress Notes (Signed)
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 Patient had restaging CT done prior to visit today, as we hope to DC PICC in near future. She is anemic and thrombocytopenic today, however WBC is ok with neulasta support.   Patient had cycle 6 carboplatin taxol on 09-01-14, with neulasta by On Pro injector at 24 hours. 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 She denies nausea/ vomiting or bleeding. She is fatigued with fairly minimal exertion. Bowels are moving daily. She would like PICC DCd as soon as appropriate, tho no problems specifically with this.   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  ONCOLOGIC HISTORY Patient had been in usual good health, up to date on all medical care, when she developed bladder symptoms in early 2016. Urine culture showed no infection, then CT AP at [facility] 03-16-14 found perihepatic ascites and pelvic mass 9.9 x 20.1 x 17.8 cm which was cystic and solid, kidneys and ureters unremarkable, no pelvic fluid, rectosigmoid partially compressed, other organs not remarkable, no mention of adenopathy (report scanned into this EMR). Past gyn care has been by Dr Alden Hipp, however she was seen promptly by Dr Evie Lacks on referral from Dr Quillian Quince after CT resulted; CT findings confirmed by transvaginal US. CA 125 was 56 pre op. She saw Dr Alycia Rossetti in consultation on 03-23-14, then had surgery at [facility] on 03-31-14 which was TAH/BSO, omentectomy, pelvic and paraaortic node  evaluation. Surgical findings were of bilateral complex ovarian masses and dense adhesions, with intra-operative spill of right ovarian mass as that was densely adherent to sigmoid serosa; posterior uterine segment also adherent to rectosigmoid, and retroperitoneal fibrosis bilaterally. Pathology [facility] [phone]) found grade 3 clear cell carcinoma involving both ovaries, largest 13 x 9.5 x 6.2 cm, + LVSI and one right pelvic node with microscopic involvement approximately 1 mm, ER PR negative, pT3a1 pN1 pMx. Cytology from ascites [facility] [phone]) was positive for adenocarcinoma of gyn primary. Multidisciplinary conference at [facility] 04-19-14 recommended carboplatin and taxane chemotherapy. Cycle 1 carbo taxol was given on 05-12-14, with neutropenia by day 11 (ANC 0.8) despite having started granix on day 8.She was hospitalized at [facility] after cycle 3 with syncope related to dehydration. She completed cycle 6 on 09-01-14.     Review of systems as above, also: No fever or symptoms of infection. Not SOB at rest and no chest pain. No LE swelling. No abdominal or pelvic pain.  Remainder of 10 point Review of Systems negative.  Objective:  Vital signs in last 24 hours:  BP 127/76 mmHg  Pulse 84  Temp(Src) 98.4 F (36.9 C) (Oral)  Resp 16  Ht 5' 4" (1.626 m)  Wt 202 lb 9.6 oz (91.899 kg)  BMI 34.76 kg/m2  SpO2 100% Weight up 6 lbs. Mucous membranes and nailbeds pale, not icteric. Resprations not labored RA Alert, oriented and appropriate. Ambulatory without assistance.  Alopecia  HEENT:PERRL, sclerae not icteric. Oral mucosa moist without lesions, posterior pharynx clear.  Neck supple. No JVD.  Lymphatics:no cervical,supraclavicular adenopathy Resp: clear to auscultation bilaterally  and normal percussion bilaterally Cardio: regular rate and rhythm. No gallop. GI: soft, nontender, not distended, no mass or organomegaly. Normally active bowel sounds. Surgical incision not remarkable. Musculoskeletal/  Extremities: without pitting edema, cords, tenderness Neuro: peripheral neuropathy distal feet L>R. Otherwise nonfocal. PSYCH appropriate mood and affect Skin without rash, ecchymosis, petechiae PICC RUE dressing clean and intact  Lab Results:  CBC today with WBC 5.0, ANC 2.3, Hgb 8.0, plt 60k CMET WNL CA 125 available after visit 5  Studies/Results: CT did not result until after visit  Ct Abdomen Pelvis W Contrast  09/14/2014   CLINICAL DATA:  Restaging bilateral ovarian cancer post surgery and chemo  EXAM: CT ABDOMEN AND PELVIS WITH CONTRAST  TECHNIQUE: Multidetector CT imaging of the abdomen and pelvis was performed using the standard protocol following bolus administration of intravenous contrast.  CONTRAST:  170m OMNIPAQUE IOHEXOL 300 MG/ML  SOLN  COMPARISON:  CT abdomen 03/16/2014  FINDINGS: Lower chest: Lung bases are clear.  Hepatobiliary: No focal hepatic lesion. No biliary duct dilatation. Gallbladder is normal. Common bile duct is normal.  Pancreas: Pancreas is normal. No ductal dilatation. No pancreatic inflammation.  Spleen: Normal spleen  Adrenals/urinary tract: Adrenal glands are normal. Kidneys, ureters, and bladder normal.  Stomach/Bowel: Stomach, small-bowel, appendix, cecum normal. There is a moderate volume stool throughout the colon. There is a the stricturing of the colon at the junction of the sigmoid colon and rectum. This narrowing occurs over a 3.5 cm segment (image 63, series 2).  Vascular/Lymphatic: Abdominal aorta is normal caliber. There is no retroperitoneal or periportal lymphadenopathy. No pelvic lymphadenopathy.  Reproductive: Post hysterectomy anatomy. Section of the large bilateral ovarian cystic masses.  Musculoskeletal: No aggressive osseous lesion.  Other: No free-fluid. There are 3 nodules associated with the serosal surface of the transverse colon. This nodule measures 8 mm on image 31 series 2, 10 mm on image 31 and 30 11 mm image 32.  IMPRESSION: 1. Segment  of circumferential bowel wall narrowing at the junction of the sigmoid colon and rectum is concerning for a serosal metastasis with subsequent constriction of the bowel lumen. There is a moderate volume of stool throughout the colon suggesting some degree of early obstruction. There is stool in the rectum. 2. Status post hysterectomy and resection of the cystic ovarian masses. 3. Three nodules adherent to the transverse colon are concerning for serosal metastasis. 4. Subcutaneous thickening at the umbilicus likely related to surgery.  These results will be called to the ordering clinician or representative by the Radiologist Assistant, and communication documented in the PACS or zVision Dashboard.   Electronically Signed   By: SSuzy BouchardM.D.   On: 09/14/2014 16:32    Medications: I have reviewed the patient's current medications. She continues ferrous fumarate.   DISCUSSION: Counts reviewed today. She is symptomatic from anemia and will be transfused 1 unit PRBCs tomorrow. Platelets are lower than would be best to DC PICC now, tho will do that if count improved sufficiently by early next week.  CT report available several hours after visit, as above. I will let Dr GAlycia Rossett[place]now, [place]; may need to consider PET. I cannot tell from EMR information if patient has had colonoscopy, which we need to confirm with patient, and do not find fecal occult blood testing.  Even if PET needed, will likely be 1-2 weeks to allow preauthorization, so probably will be best not to keep PICC for that scan, tho access  is very difficult.  Information will be communicated to patient after gyn oncology input if possible.   Assessment/Plan:  1. High grade clear cell carcinoma of bilateral ovaries with microscopic involvement of one right pelvic node at complete debulking surgery by Dr Alycia Rossetti at [facility] 03-31-2014, FIGO IIIA1 (i). Every 3 week carboplatin and taxol x 6 cycles 05-12-14  thru 09-01-14, with neulasta support. CT AP today has findings of concern at sigmoid colon and rectum, which will be discussed with gyn oncology. 2,PICC placed 08-04-14.Did not remove today as she will have PRBC transfusion on 8-19 and platelets only 60K today. If she needs further treatment, will need PAC. 3. 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  8.environmental allergies, previously used zyrtec and flonase, now using claritin due to taxol/gCSF aches. 9.Advance Directive information given 10.Creatinine more elevated with hyzaar and with dehydration previously. 11. Chemo anemia: hgb lower and symptomatic, requests transfusion which has been set up for 1 [facility]BCs [facility] on 09[facility]14. Note iron ok 06-2014. WBC maintained with neulasta, previous chemo neutropenia. Chemo thrombocytopenia without overt bleeding.   Time spent 30 min with visit, including >50% counseling and coordination of care.  Scan information and status to be communicated to gyn oncology. Likely will need to adjust appointments that were set up prior to CT information.     Muskan Bolla P, MD   09/14/2014, 5:07 PM

## 2014-09-14 NOTE — Patient Instructions (Signed)
PICC Home Guide A peripherally inserted central catheter (PICC) is a long, thin, flexible tube that is inserted into a vein in the upper arm. It is a form of intravenous (IV) access. It is considered to be a "central" line because the tip of the PICC ends in a large vein in your chest. This large vein is called the superior vena cava (SVC). The PICC tip ends in the SVC because there is a lot of blood flow in the SVC. This allows medicines and IV fluids to be quickly distributed throughout the body. The PICC is inserted using a sterile technique by a specially trained nurse or physician. After the PICC is inserted, a chest X-ray exam is done to be sure it is in the correct place.  A PICC may be placed for different reasons, such as:  To give medicines and liquid nutrition that can only be given through a central line. Examples are:  Certain antibiotic treatments.  Chemotherapy.  Total parenteral nutrition (TPN).  To take frequent blood samples.  To give IV fluids and blood products.  If there is difficulty placing a peripheral intravenous (PIV) catheter. If taken care of properly, a PICC can remain in place for several months. A PICC can also allow a person to go home from the hospital early. Medicine and PICC care can be managed at home by a family member or home health care team. WHAT PROBLEMS CAN HAPPEN WHEN I HAVE A PICC? Problems with a PICC can occasionally occur. These may include the following:  A blood clot (thrombus) forming in or at the tip of the PICC. This can cause the PICC to become clogged. A clot-dissolving medicine called tissue plasminogen activator (tPA) can be given through the PICC to help break up the clot.  Inflammation of the vein (phlebitis) in which the PICC is placed. Signs of inflammation may include redness, pain at the insertion site, red streaks, or being able to feel a "cord" in the vein where the PICC is located.  Infection in the PICC or at the insertion  site. Signs of infection may include fever, chills, redness, swelling, or pus drainage from the PICC insertion site.  PICC movement (malposition). The PICC tip may move from its original position due to excessive physical activity, forceful coughing, sneezing, or vomiting.  A break or cut in the PICC. It is important to not use scissors near the PICC.  Nerve or tendon irritation or injury during PICC insertion. WHAT SHOULD I KEEP IN MIND ABOUT ACTIVITIES WHEN I HAVE A PICC?  You may bend your arm and move it freely. If your PICC is near or at the bend of your elbow, avoid activity with repeated motion at the elbow.  Rest at home for the remainder of the day following PICC line insertion.  Avoid lifting heavy objects as instructed by your health care provider.  Avoid using a crutch with the arm on the same side as your PICC. You may need to use a walker. WHAT SHOULD I KNOW ABOUT MY PICC DRESSING?  Keep your PICC bandage (dressing) clean and dry to prevent infection.  Ask your health care provider when you may shower. Ask your health care provider to teach you how to wrap the PICC when you do take a shower.  Change the PICC dressing as instructed by your health care provider.  Change your PICC dressing if it becomes loose or wet. WHAT SHOULD I KNOW ABOUT PICC CARE?  Check the PICC insertion site   daily for leakage, redness, swelling, or pain.  Do not take a bath, swim, or use hot tubs when you have a PICC. Cover PICC line with clear plastic wrap and tape to keep it dry while showering.  Flush the PICC as directed by your health care provider. Let your health care provider know right away if the PICC is difficult to flush or does not flush. Do not use force to flush the PICC.  Do not use a syringe that is less than 10 mL to flush the PICC.  Never pull or tug on the PICC.  Avoid blood pressure checks on the arm with the PICC.  Keep your PICC identification card with you at all  times.  Do not take the PICC out yourself. Only a trained clinical professional should remove the PICC. SEEK IMMEDIATE MEDICAL CARE IF:  Your PICC is accidentally pulled all the way out. If this happens, cover the insertion site with a bandage or gauze dressing. Do not throw the PICC away. Your health care provider will need to inspect it.  Your PICC was tugged or pulled and has partially come out. Do not  push the PICC back in.  There is any type of drainage, redness, or swelling where the PICC enters the skin.  You cannot flush the PICC, it is difficult to flush, or the PICC leaks around the insertion site when it is flushed.  You hear a "flushing" sound when the PICC is flushed.  You have pain, discomfort, or numbness in your arm, shoulder, or jaw on the same side as the PICC.  You feel your heart "racing" or skipping beats.  You notice a hole or tear in the PICC.  You develop chills or a fever. MAKE SURE YOU:   Understand these instructions.  Will watch your condition.  Will get help right away if you are not doing well or get worse. Document Released: 07/20/2002 Document Revised: 05/30/2013 Document Reviewed: 09/20/2012 ExitCare Patient Information 2015 [employer]. This information is not intended to replace advice given to you by your health care provider. Make sure you discuss any questions you have with your health care provider.  

## 2014-09-15 ENCOUNTER — Telehealth: Payer: Self-pay | Admitting: Oncology

## 2014-09-15 ENCOUNTER — Ambulatory Visit (HOSPITAL_COMMUNITY)
Admission: RE | Admit: 2014-09-15 | Discharge: 2014-09-15 | Disposition: A | Discharge status: Home / Self Care | Payer: Managed Care, Other (non HMO) | Source: Ambulatory Visit | Attending: Oncology | Admitting: Oncology

## 2014-09-15 VITALS — BP 113/67 | HR 63 | Temp 98.2°F | Resp 18

## 2014-09-15 DIAGNOSIS — T451X5A Adverse effect of antineoplastic and immunosuppressive drugs, initial encounter: Secondary | ICD-10-CM | POA: Diagnosis not present

## 2014-09-15 DIAGNOSIS — D6481 Anemia due to antineoplastic chemotherapy: Secondary | ICD-10-CM

## 2014-09-15 LAB — CA 125: CA 125: 5 U/mL (ref <35)

## 2014-09-15 LAB — PREPARE RBC (CROSSMATCH)

## 2014-09-15 MED ORDER — SODIUM CHLORIDE 0.9 % IV SOLN
250.0000 mL | Freq: Once | INTRAVENOUS | Status: AC
Start: 2014-09-15 — End: 2014-09-15
  Administered 2014-09-15: 250 mL via INTRAVENOUS

## 2014-09-15 MED ORDER — SODIUM CHLORIDE 0.9 % IJ SOLN
10.0000 mL | INTRAMUSCULAR | Status: AC | PRN
  Administered 2014-09-15: 10 mL

## 2014-09-15 MED ORDER — SODIUM CHLORIDE 0.9 % IJ SOLN: 3.0000 mL | INTRAMUSCULAR | Status: DC | PRN

## 2014-09-15 MED ORDER — HEPARIN SOD (PORK) LOCK FLUSH 100 UNIT/ML IV SOLN: 500.0000 [IU] | Freq: Every day | INTRAVENOUS | Status: DC | PRN

## 2014-09-15 MED ORDER — ACETAMINOPHEN 325 MG PO TABS
325.0000 mg | ORAL_TABLET | Freq: Once | ORAL | Status: AC
  Administered 2014-09-15: 325 mg via ORAL
  Filled 2014-09-15: qty 1

## 2014-09-15 MED ORDER — HEPARIN SOD (PORK) LOCK FLUSH 100 UNIT/ML IV SOLN
250.0000 [IU] | INTRAVENOUS | Status: AC | PRN
  Administered 2014-09-15: 250 [IU]
  Filled 2014-09-15: qty 5

## 2014-09-15 NOTE — Telephone Encounter (Signed)
Called patient and she is aware of her appointments °

## 2014-09-15 NOTE — Procedures (Signed)
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

## 2014-09-15 NOTE — Telephone Encounter (Signed)
Medical Oncology  Reached patient on cell, driving home from PRBC transfusion, which was completed without difficulty. Told her briefly that CT showed some changes in colon and rectal area. She had colonoscopy in [place] ~ 2 years ago, with polyps. Does not remember name of physician. Told her that Dr Alycia Rossetti is in [place] at [facility] but will get the information to her; told her that PET may be useful and that Dr Denman George had reviewed information in [place] at my request today. She will be at [facility] 8-22 for CBC and possible PICC DC. Told her that preauth for PET can take 1-2 weeks, so may not want to keep PICC for that. Told her I would talk with her further probably at office on 09-18-14.  Godfrey Pick, MD

## 2014-09-16 ENCOUNTER — Encounter: Payer: Self-pay | Admitting: Oncology

## 2014-09-16 NOTE — Progress Notes (Signed)
[facility] END OF TREATMENT   Name: Audrey Porter Date: September 16, 2014  MRN: [id number] DOB: Dec 04, 1960   TREATMENT DATES: 05-12-14 thru 09-01-14  REFERRING PHYSICIAN: Nancy Marus  DIAGNOSIS: clear cell carcinoma of bilateral ovaries   STAGE AT START OF TREATMENT: IIIA   INTENT: curative   DRUGS OR REGIMENS GIVEN: carboplatin taxol x 6 cycles   MAJOR TOXICITIES: Neutropenia requiring gCSF, anemia requiring PRBC transfusions, thrombocytopenia, peripheral neuropathy   REASON TREATMENT STOPPED: completion of planned coures   PERFORMANCE STATUS AT END: ECOG 1    ONGOING PROBLEMS: anemia, thrombocytopenia, peripheral neuropathy. CT concerns for progressive disease   FOLLOW UP PLANS: gyn oncology to see in reference to CT concerns

## 2014-09-17 LAB — TYPE AND SCREEN
ABO/RH(D): B POS
ANTIBODY SCREEN: NEGATIVE
Unit division: 0

## 2014-09-18 ENCOUNTER — Ambulatory Visit (HOSPITAL_BASED_OUTPATIENT_CLINIC_OR_DEPARTMENT_OTHER): Payer: Managed Care, Other (non HMO) | Admitting: Oncology

## 2014-09-18 ENCOUNTER — Other Ambulatory Visit (HOSPITAL_BASED_OUTPATIENT_CLINIC_OR_DEPARTMENT_OTHER): Payer: Managed Care, Other (non HMO)

## 2014-09-18 ENCOUNTER — Other Ambulatory Visit: Payer: Self-pay | Admitting: Oncology

## 2014-09-18 ENCOUNTER — Ambulatory Visit (HOSPITAL_BASED_OUTPATIENT_CLINIC_OR_DEPARTMENT_OTHER): Payer: Managed Care, Other (non HMO)

## 2014-09-18 VITALS — BP 134/70 | HR 74 | Temp 98.4°F | Resp 20

## 2014-09-18 DIAGNOSIS — T451X5A Adverse effect of antineoplastic and immunosuppressive drugs, initial encounter: Secondary | ICD-10-CM

## 2014-09-18 DIAGNOSIS — G620 Drug-induced polyneuropathy: Secondary | ICD-10-CM

## 2014-09-18 DIAGNOSIS — C561 Malignant neoplasm of right ovary: Secondary | ICD-10-CM | POA: Diagnosis not present

## 2014-09-18 DIAGNOSIS — C562 Malignant neoplasm of left ovary: Secondary | ICD-10-CM

## 2014-09-18 DIAGNOSIS — D6481 Anemia due to antineoplastic chemotherapy: Secondary | ICD-10-CM

## 2014-09-18 DIAGNOSIS — C563 Malignant neoplasm of bilateral ovaries: Secondary | ICD-10-CM

## 2014-09-18 DIAGNOSIS — Z452 Encounter for adjustment and management of vascular access device: Secondary | ICD-10-CM

## 2014-09-18 DIAGNOSIS — C775 Secondary and unspecified malignant neoplasm of intrapelvic lymph nodes: Secondary | ICD-10-CM

## 2014-09-18 DIAGNOSIS — G622 Polyneuropathy due to other toxic agents: Secondary | ICD-10-CM | POA: Diagnosis not present

## 2014-09-18 DIAGNOSIS — D6959 Other secondary thrombocytopenia: Secondary | ICD-10-CM

## 2014-09-18 LAB — HOLD TUBE, BLOOD BANK

## 2014-09-18 LAB — CBC WITH DIFFERENTIAL/PLATELET
BASO%: 0.2 % (ref 0.0–2.0)
Basophils Absolute: 0 10*3/uL (ref 0.0–0.1)
EOS%: 0.6 % (ref 0.0–7.0)
Eosinophils Absolute: 0 10*3/uL (ref 0.0–0.5)
HCT: 27.7 % — ABNORMAL LOW (ref 34.8–46.6)
HEMOGLOBIN: 9.5 g/dL — AB (ref 11.6–15.9)
LYMPH%: 36.8 % (ref 14.0–49.7)
MCH: 33.2 pg (ref 25.1–34.0)
MCHC: 34.2 g/dL (ref 31.5–36.0)
MCV: 97.1 fL (ref 79.5–101.0)
MONO#: 0.4 10*3/uL (ref 0.1–0.9)
MONO%: 7.4 % (ref 0.0–14.0)
NEUT%: 55 % (ref 38.4–76.8)
NEUTROS ABS: 2.9 10*3/uL (ref 1.5–6.5)
Platelets: 76 10*3/uL — ABNORMAL LOW (ref 145–400)
RBC: 2.86 10*6/uL — AB (ref 3.70–5.45)
RDW: 19.4 % — AB (ref 11.2–14.5)
WBC: 5.3 10*3/uL (ref 3.9–10.3)
lymph#: 1.9 10*3/uL (ref 0.9–3.3)

## 2014-09-18 MED ORDER — SODIUM CHLORIDE 0.9 % IJ SOLN
10.0000 mL | INTRAMUSCULAR | Status: AC | PRN
  Administered 2014-09-18: 10 mL
  Filled 2014-09-18: qty 10

## 2014-09-18 MED ORDER — HEPARIN SOD (PORK) LOCK FLUSH 100 UNIT/ML IV SOLN
250.0000 [IU] | INTRAVENOUS | Status: AC | PRN
  Administered 2014-09-18: 250 [IU]
  Filled 2014-09-18: qty 5

## 2014-09-18 NOTE — Progress Notes (Signed)
Per Cristopher Estimable , RN flush PICC and discharge pt.

## 2014-09-18 NOTE — Patient Instructions (Signed)
Thrombocytopenia °Thrombocytopenia is a condition in which there is an abnormally small number of platelets in your blood. Platelets are also called thrombocytes. Platelets are needed for blood clotting. °CAUSES °Thrombocytopenia is caused by:  °· Decreased production of platelets. This can be caused by: °¨ Aplastic anemia in which your bone marrow quits making blood cells. °¨ Cancer in the bone marrow. °¨ Use of certain medicines, including chemotherapy. °¨ Infection in the bone marrow. °¨ Heavy alcohol consumption. °· Increased destruction of platelets. This can be caused by: °¨ Certain immune diseases. °¨ Use of certain drugs. °¨ Certain blood clotting disorders. °¨ Certain inherited disorders. °¨ Certain bleeding disorders. °¨ Pregnancy. °· Having an enlarged spleen (hypersplenism). In hypersplenism, the spleen gathers up platelets from circulation. This means the platelets are not available to help with blood clotting. The spleen can enlarge due to cirrhosis or other conditions. °SYMPTOMS  °The symptoms of thrombocytopenia are side effects of poor blood clotting. Some of these are: °· Abnormal bleeding. °· Nosebleeds. °· Heavy menstrual periods. °· Blood in the urine or stools. °· Purpura. This is a purplish discoloration in the skin produced by small bleeding vessels near the surface of the skin. °· Bruising. °· A rash that may be petechial. This looks like pinpoint, purplish-red spots on the skin and mucous membranes. It is caused by bleeding from small blood vessels (capillaries). °DIAGNOSIS  °Your caregiver will make this diagnosis based on your exam and blood tests. Sometimes, a bone marrow study is done to look for the original cells (megakaryocytes) that make platelets. °TREATMENT  °Treatment depends on the cause of the condition. °· Medicines may be given to help protect your platelets from being destroyed. °· In some cases, a replacement (transfusion) of platelets may be required to stop or prevent  bleeding. °· Sometimes, the spleen must be surgically removed. °HOME CARE INSTRUCTIONS  °· Check the skin and linings inside your mouth for bruising or bleeding as directed by your caregiver. °· Check your sputum, urine, and stool for blood as directed by your caregiver. °· Do not return to any activities that could cause bumps or bruises until your caregiver says it is okay. °· Take extra care not to cut yourself when shaving or when using scissors, needles, knives, and other tools. °· Take extra care not to burn yourself when ironing or cooking. °· Ask your caregiver if it is okay for you to drink alcohol. °· Only take over-the-counter or prescription medicines as directed by your caregiver. °· Notify all your caregivers, including dentists and eye doctors, about your condition. °SEEK IMMEDIATE MEDICAL CARE IF:  °· You develop active bleeding from anywhere in your body. °· You develop unexplained bruising or bleeding. °· You have blood in your sputum, urine, or stool. °MAKE SURE YOU: °· Understand these instructions. °· Will watch your condition. °· Will get help right away if you are not doing well or get worse. °Document Released: 01/13/2005 Document Revised: 04/07/2011 Document Reviewed: 11/15/2010 °ExitCare® Patient Information ©2015 [employer]. This information is not intended to replace advice given to you by your health care provider. Make sure you discuss any questions you have with your health care provider. ° °

## 2014-09-19 ENCOUNTER — Telehealth: Payer: Self-pay | Admitting: Oncology

## 2014-09-19 ENCOUNTER — Encounter: Payer: Self-pay | Admitting: Oncology

## 2014-09-19 NOTE — Progress Notes (Signed)
OFFICE PROGRESS NOTE   September 18, 2014  Physicians:Paola Elon Alas, MD (PCP), Alden Hipp), Gari [facility])  INTERVAL HISTORY:  Patient is seen, alone for visit, to update her on findings of CT AP done 09-14-14 and input from Dr Alycia Rossetti from her review. At Dr Elenora Gamma request, patient is to be seen by Dr Denman George on 09-20-14.  She has PICC still in, flushed today but not removed due to platelets 75K today and pending scheduling of PET.  Patient was transfused 1 unit PRBCs on 09-15-14 for symptomatic anemia, hemoglobin 8.0 on 09-14-14. She has felt stronger since the transfusion, now able to go up and down stairs in home. She had colonoscopy in [place] 2-3 years ago, recalls "polyps".   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 See that operative note for surgical findings of dense adhesions and retroperitoneal fibrosis.  Patient had 6 cycles of adjuvant carboplatin taxol from 05-12-14 thru 09-01-14. Chemotherapy was complicated by cytopenias and peripheral neuropathy. As we hoped to DC PICC soon, she had restaging CT AP 09-14-14 (pre op scan at [facility] [id number] and not available for comparison), with follow up visit to Dr Alycia Rossetti planned 10-18-14.    PICC placed 08-04-14 CA 125 preoperatively 56, this 5 on 09-14-14. 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     ONCOLOGIC HISTORY Patient had been in usual good health, up to date on all medical care, when she developed bladder symptoms in early 2016. Urine culture showed no infection, then CT AP at [facility] 03-16-14 found perihepatic ascites and pelvic mass 9.9 x 20.1 x 17.8 cm which was cystic and solid, kidneys and ureters unremarkable, no pelvic fluid, rectosigmoid partially compressed, other organs not remarkable, no mention of adenopathy  (report scanned into this EMR). Past gyn care has been by Dr Alden Hipp, however she was seen promptly by Dr Evie Lacks on referral from Dr Quillian Quince after CT resulted; CT findings confirmed by transvaginal US. CA 125 was 56 pre op. She saw Dr Alycia Rossetti in consultation on 03-23-14, then had surgery at [facility] on 03-31-14 which was TAH/BSO, omentectomy, pelvic and paraaortic node evaluation. Surgical findings were of bilateral complex ovarian masses and dense adhesions, with intra-operative spill of right ovarian mass as that was densely adherent to sigmoid serosa; posterior uterine segment also adherent to rectosigmoid, and retroperitoneal fibrosis bilaterally. Pathology [facility] [phone]) found grade 3 clear cell carcinoma involving both ovaries, largest 13 x 9.5 x 6.2 cm, + LVSI and one right pelvic node with microscopic involvement approximately 1 mm, ER PR negative, pT3a1 pN1 pMx. Cytology from ascites [facility] [phone]) was positive for adenocarcinoma of gyn primary. Multidisciplinary conference at [facility] 04-19-14 recommended carboplatin and taxane chemotherapy. Cycle 1 carbo taxol was given on 05-12-14, with neutropenia by day 11 (ANC 0.8) despite having started granix on day 8.She was hospitalized at [facility] after cycle 3 with syncope related to dehydration. She completed cycle 6 on 09-01-14.    Review of systems as above, also: Not SOB at rest, no chest pain. No bleeding. Bowels moved a small amount this AM, has not been using miralax regularly. No abdominal or pelvic pain. A little pruritic skin irritation above PICC dressing, no tenderness at insertion, no swelling. No fever. No LE swelling. Bladder ok. Peripheral neuropathy feet > hands unchanged. Remainder of 10 point Review of Systems negative.  Objective:  Vital signs in last 24 hours: 134/70, 74 regular, 20 not labored RA, 98.4  Alert, oriented and appropriate. Ambulatory without difficulty.  Alopecia  HEENT unchanged Lymphatics:no cervical,supraclavicular  adenopathy Resp: clear to auscultation bilaterally  Cardio: regular rate and rhythm. No gallop. GI: soft, nontender, not distended, no mass or organomegaly. Few bowel sounds. Musculoskeletal/ Extremities: without pitting edema, cords, tenderness Neuro: peripheral neuropathy toes and distal feet unchanged. Otherwise nonfocal. PSYCH appropriate mood and affect Skin otherwise without rash, ecchymosis, petechiae PICC with clean dressing, slight irritation above superior adhesive, no swelling RUE, flushed and good blood return today.  Lab Results:  Results for orders placed or performed in visit on 09/18/14  CBC with Differential  Result Value Ref Range   WBC 5.3 3.9 - 10.3 10e3/uL   NEUT# 2.9 1.5 - 6.5 10e3/uL   HGB 9.5 (L) 11.6 - 15.9 g/dL   HCT 27.7 (L) 34.8 - 46.6 %   Platelets 76 (L) 145 - 400 10e3/uL   MCV 97.1 79.5 - 101.0 fL   MCH 33.2 25.1 - 34.0 pg   MCHC 34.2 31.5 - 36.0 g/dL   RBC 2.86 (L) 3.70 - 5.45 10e6/uL   RDW 19.4 (H) 11.2 - 14.5 %   lymph# 1.9 0.9 - 3.3 10e3/uL   MONO# 0.4 0.1 - 0.9 10e3/uL   Eosinophils Absolute 0.0 0.0 - 0.5 10e3/uL   Basophils Absolute 0.0 0.0 - 0.1 10e3/uL   NEUT% 55.0 38.4 - 76.8 %   LYMPH% 36.8 14.0 - 49.7 %   MONO% 7.4 0.0 - 14.0 %   EOS% 0.6 0.0 - 7.0 %   BASO% 0.2 0.0 - 2.0 %  Hold Tube, Blood Bank  Result Value Ref Range   Hold Tube, Blood Bank Blood Bank Order Cancelled      Studies/Results: Preoperative CT AP was done at [facility] 03-16-14, report scanned into this EMR under imaging, comment "rectosigmoid colon is partially compressed by mass".   PACs images of CT 09-14-14 reviewed by MD, however no specific areas marked by radiologist. Report discussed with patient now, and gyn oncology will be best to review images with her.  Medications: I have reviewed the patient's current medications. Use miralax daily now.  DISCUSSION: Patient understands that findings are concerning for progressive ovarian cancer, tho we have also  discussed extensive adhesions and fibrosis at time of surgery possibly related to stricturing of the rectosigmoid. I have requested PET and have let preauthorization staff know.  I will request colonoscopy report from Dr Rayna Sexton office.  We will leave PICC in at least until gyn oncology visit, could DC then if platelets >=~ 90k and if there is going to be much of a delay getting PET accomplished. Note peripheral IV access even for PET likely will not be easy, so if PET can be set up soon she may prefer to keep PICC a little longer. Patient understands that she will need PAC if further chemo. PICC appointments not set up beyond today.     Assessment/Plan: 1. High grade clear cell carcinoma of bilateral ovaries with microscopic involvement of one right pelvic node at complete debulking surgery by Dr Alycia Rossetti at [facility] 03-31-2014, FIGO IIIA1 (i). Dense adhesions and retroperitoneal fibrosis at surgery. Carboplatin and taxol x 6 cycles 05-12-14 thru 09-01-14, with neulasta support. CT AP with areas of concern rectosigmoid and transverse colon. Appreciate input from Dr Alycia Rossetti and visit with Dr Denman George 09-20-14. PET requested.  2,PICC placed 08-04-14.Did not remove again today with  platelets 75K and timing of PET TBD. WIll need other flush apts if kept in.  If she needs further treatment, will need PAC. 3. 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 Colonoscopy with some polyps ~ 2 yrs ago, report requested. 8.environmental allergies, previously used zyrtec and flonase, now using claritin due to taxol/gCSF aches. 9.Advance Directive information given 10.Creatinine more elevated with hyzaar and with dehydration previously. 11. Chemo anemia: improved with 1 unit PRBCs  09-15-14. Note iron ok 06-2014. WBC maintained with neulasta, previous  chemo neutropenia. Chemo thrombocytopenia without overt bleeding.    Patient understands information discussed today and will keep appointment with gyn oncology on 09-20-14. Time spent 30 min including >50% counseling and coordination of care Cc Drs Alycia Rossetti, Denman George, Ileene Rubens, MD

## 2014-09-19 NOTE — Telephone Encounter (Signed)
MEDICAL ONCOLOGY  LM for medical records at Dr Vista Lawman office [phone]) requesting report of colonoscopy done ~ 2-3 years ago. Requested information if possible in time for gyn oncology visit on 09-20-14. Asked that report be faxed either to [phone] or [phone] Left RN # as contact [phone].  Godfrey Pick, MD

## 2014-09-20 ENCOUNTER — Other Ambulatory Visit: Payer: Self-pay

## 2014-09-20 ENCOUNTER — Encounter: Payer: Self-pay | Admitting: Gynecologic Oncology

## 2014-09-20 ENCOUNTER — Ambulatory Visit: Payer: Managed Care, Other (non HMO) | Attending: Gynecologic Oncology | Admitting: Gynecologic Oncology

## 2014-09-20 DIAGNOSIS — C562 Malignant neoplasm of left ovary: Principal | ICD-10-CM

## 2014-09-20 DIAGNOSIS — C569 Malignant neoplasm of unspecified ovary: Secondary | ICD-10-CM | POA: Diagnosis not present

## 2014-09-20 DIAGNOSIS — C563 Malignant neoplasm of bilateral ovaries: Secondary | ICD-10-CM

## 2014-09-20 DIAGNOSIS — C561 Malignant neoplasm of right ovary: Secondary | ICD-10-CM

## 2014-09-20 NOTE — Progress Notes (Signed)
Audrey Porter will keep PICC line in until 09-27-14 PET scan is done.  She will have DSG. Change and flush on 09-22-15; lab and flush 09-26-14 to see if platelet count high enough for PICC removal 09-27-14 after PET scan. POF placed for scheduling.

## 2014-09-20 NOTE — Patient Instructions (Signed)
We will arrange for you to have a colonoscopy in [place].  We will see what the PET scan and colonoscopy shows and then recommend monitoring the area or beginning chemotherapy.  Please call for any questions or concerns.

## 2014-09-20 NOTE — Progress Notes (Signed)
Followup Note: Gyn-Onc  [name] 54 y.o. female  CC:  Chief Complaint  Patient presents with  . Pelvic Mass    Assessment and Plan: REJEANA FADNESS is a 54 y.o. Likely recurrent, platinum refractory Stage IIIA1(i) clear cell carcinoma of the ovary with persistent/progressive disease in the pelvic and sigmoid colon immediately following receipt of 6 cycles of adjvuant carboplatin and paclitaxel. Her recurrence is asymptomatic.  1/ PET/CT to better delineate the metabolic activity of this disease. The CT does not show a conclusive mass, though the compression on the sigmoid colon is certainly concerning for progressive disease. Her CA 125 is normal (5) and was slightly elevated (56) preoperatively.  2/ colonoscopy to evaluate for degree of stricture of colon and to evaluate for transluminal extension.  If there is more apparent evidence for recurrence (PET avid disease) would recommend starting salvage therapy (eg with Doxil). If there are equivocal findings on PET for disease, given her asymptomatic status and normal CA 125, I believe it would be reasonable to delay initiating therapy, and instead re-evaluate with imaging and serial CA 125 in 2 months.  We discussed the poor prognosis associated with platinum refractory disease, and specifically, clear cell type tumors.  HPI: Patient is seen in consultation today at the request of Dr. Evie Lacks. Her primary care physician is Dr. Gar Ponto.  Patient is a 54 year old gravida 1 para 1 who thought that she was gaining weight for the past few months. She states that in January she had a urinary tract infection or so she thought. She started feeling some increase pressure and immediately after voiding felt like she still needed to void again and was having only small amounts of urine. She was seen by her primary care physician's office and she was diagnosed with a urinary tract infection and provided antibiotics; however, she was contacted  that the urine culture was negative and her symptoms had not abated. She ultimately sought care for this. In retrospect she thinks that this is been developing an ongoing for several months.   She underwent a CT scan on 03/16/2014. It revealed the lung bases were clear. The liver enhances with no focal abnormality. There was perihepatic ascites present. The gallbladder was surgically absent. The pancreas, adrenal glands, spleen, stomach, kidneys were unremarkable. There was no evidence of any peritoneal nodularity or carcinomatosis. There is no lymphadenopathy. Within the pelvis there was a 9.9 x 20.1 x 17.8 large lobular mass which was septated and primarily cystic. However, the mass does contain some soft tissue component. The uterus could not be visualized. The urinary bladder was compressed by the large septated lobular mass.   Transvaginal ultrasound was performed on February 22. The uterus was 8.8 x 2.8 x 4.0 cm with no fibroids or other masses visualized. The endometrium was approximately 12 mm in thickness. The right ovary and left ovary were difficult to definitively leave visualize. Within the pelvis was a 20 x 19 x 9 cm solid and cystic mass which filled the pelvic pelvis. There were large solid components with internal blood flow. There was a 1.5 cm bladder stone that was incidentally noted. CA-125 was performed that was 56.1. CEA was 0.6. All other labs were remarkable only for a creatinine of 1.44 and hematocrit of 32.7. Liver functions were normal. It is for this reason that she is referred to see [place] today.   Oncology Summary:    Ovarian cancer (RAF-HCC)    02/2014  Initial Diagnosis  Presented with  weight gain, pelvic pressure, urinary frequency. CT: 20cm septate, cystic mass, ascites. CA-125 56.1.    03/31/2014  Surgery  TAH/BSO, BPLND, RPALND, omentectomy, ureterolysis. Findings: Bilat complex ovarian masses, dense adhesions, intraop spill of R cyst contents. R0 resection. Path: clear cell  CA involving both ovaries, 1 R pelvic LN shows microscopic (86m) met.     Pathology: Diagnosis: FSA, A: Ovary and fallopian tube, left, left salpingo-oophorectomy  Histologic type: Clear cell carcinoma (see comment)   Histologic grade: high grade/grade 3   Tumor site: (ovary/primary peritoneal) ovary   Tumor size: (greatest dimension) 13 x 9.5 x 6.2 cm   Ovarian surface involvement: not identified   Status of capsule: (intact/ruptured) intact   Extent of involvement of other tissues/organs (select all that apply):   Right ovary  Left ovary  Lymphovascular space invasion: present   Regional lymph nodes (see other specimens):  Total number involved: 1  Total number examined: 10 Size of largest metastasis: microscopic focus only, approximately 1 mm;  necrosis, fibrosis, and foreign body type giant cell reaction present within two additional lymph nodes, changes suggestive of treatment effect or regression   Additional pathologic findings: none   AJCC Pathologic Stage: pT3a1(i) pN1 pMx FIGO (2014classification) Stage Grouping: IIIA1(i)  Note: This pathologic stage assessment is based on information available at the time of this report, and is subject to change pending clinical review and additional information.  B: Ovary and fallopian tube, right, right salpingo-oophorectomy  Histologic type: clear cell carcinoma (B8)  Histologic grade: high grade/grade 3   Tumor site: (ovary/primary peritoneal) ovary   Tumor size: (greatest dimension) 11 x 9 x 4.5 cm   Ovarian surface involvement: not identified   Status of capsule: (intact/ruptured) ruptured   C: Uterus and cervix, hysterectomy  Cervix  - No tumor seen   Endometrium  - Atrophic endometrium   Myometrium  - Adenomyosis and subserosal endometriosis   D: Omentum, omentectomy - No tumor seen   E: Lymph nodes, right periaortic, regional dissection - Three lymph nodes, no tumor seen (0/3)   F: Lymph  nodes, right pelvic, regional dissection - 1 out of 6 lymph nodes shows minute (approximately 2 mm) focus of metastatic adenocarcinoma (1/6) - Two additional lymph nodes show necrosis, fibrosis, and foreign body type giant cell reaction suggestive of treatment effect versus regression   G: Lymph node, left pelvic, regional dissection - One lymph node, no tumor seen (0/1)   COMMENT: Multiple immunostains are performed on block A12 to further evaluate the case for metastasis to the bilateral ovaries form a distant site and to evaluate for mucinous versus clear cell change. The tumor shows focal intracytoplasmic mucin on mucin stains, and immunostains shows the tumor to be cytokeratin 7 positive, cytokeratin 20 negative, CDX2 negative, PAX8 positive, and negative for chromogranin, synaptophysin, TTF1, p53, ER, and PR, and rare positive cells on CA125, with low p53 (approximately 5%). Napsin is positive. RCC and CD10 are negative. PAS is positive and the positive staining does not digest with PASD. These findings supporting a tumor of primary gynecologic origin. Overall, a clear cell carcinoma is favored. Seromucinous features cannot be excluded based on the presence of scattered, faint intracytoplasmic mucin noted on mucin stain and the morphology with focal signet ring cells. Although uncommon, this has been described, however, in clear cell carcinomas, therefore the above classification is favored.     She was dispositioned to receive adjuvant carboplatin and paclitaxel x 6 cycles which she completed on 09/01/14. Therapy  was complicated by neutropenia requiring GCSF support and anemia requiring transfusions.   CA 125 normalized to 5U/mL on 09/14/14 after completion of therapy.  Post treatment imaging performed on 09/14/14 revealed "stricturing of the colon at the junction of the sigmoid colon and rectum. This narrowing occurs over a 3.5cm segment". Three nodules were adherent to the transverse  colon and were concerning for serosal metastasis.   She is feeling well and reports no problems eating. No constipation or narrowed caliber of stool. No pain with defecation or blood in the stools. No abdominal pains. No bowel obstructive symptoms.   Current Meds:  Outpatient Encounter Prescriptions as of 09/20/2014  Medication Sig  . cetirizine (ZYRTEC) 5 MG tablet Take 5 mg by mouth daily.  Marland Kitchen docusate sodium (COLACE) 100 MG capsule Take 100 mg by mouth daily.   . ferrous fumarate (HEMOCYTE - 106 MG FE) 325 (106 FE) MG TABS tablet Take 1 tablet po daily on an empty stomach with OJ.  . fluticasone (FLONASE) 50 MCG/ACT nasal spray Place 1 spray into both nostrils daily.   Marland Kitchen ibuprofen (ADVIL,MOTRIN) 200 MG tablet Take 200-400 mg by mouth every 8 (eight) hours as needed.  . loperamide (IMODIUM A-D) 2 MG tablet Take 2 mg by mouth 4 (four) times daily as needed for diarrhea or loose stools.  Marland Kitchen LORazepam (ATIVAN) 0.5 MG tablet Place 1 tablet under the tongue or swallow every 6 hours as needed for nausea or vomiting.  Will make drowsy.  Marland Kitchen losartan-hydrochlorothiazide (HYZAAR) 100-25 MG per tablet Take 1 tablet by mouth daily. Pt takes this medication needed depending on her BP checks.  . Multiple Vitamins-Minerals (CENTRUM SILVER ULTRA WOMENS PO) Take 1 tablet by mouth daily.  Marland Kitchen nystatin-triamcinolone (MYCOLOG II) cream Apply 1 application topically daily. Per pt, applies under both breasts after showering  . omeprazole (PRILOSEC) 20 MG capsule Take 20 mg by mouth at bedtime.  . ondansetron (ZOFRAN) 8 MG tablet Take 1 tablet (8 mg total) by mouth every 8 (eight) hours as needed for nausea or vomiting. Will not make drowsy  . oxyCODONE (OXY IR/ROXICODONE) 5 MG immediate release tablet Take 1 tablet every 4-6 hrs as needed for aches  . polyethylene glycol (MIRALAX / GLYCOLAX) packet Take 17 g by mouth daily as needed.   No facility-administered encounter medications on file as of 09/20/2014.     Allergy:  Allergies  Allergen Reactions  . Sulfa Antibiotics Hives    Social Hx:  She has a daughter who is 78. 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 Marital Status: Married    Spouse Name: N/A  . Number of Children: N/A  . Years of Education: N/A   Occupational History  . Not on file.   Social History Main Topics  . Smoking status: Never Smoker   . Smokeless tobacco: Not on file  . Alcohol Use: No  . Drug Use: No  . Sexual Activity: No   Other Topics Concern  . Not on file   Social History Narrative    Past Surgical Hx:  Past Surgical History  Procedure Laterality Date  . Abdominal hysterectomy  03/31/14    at [facility]  . Cholecystectomy  2000  . Cesarean section  1990    Past Medical Hx:  Past Medical History  Diagnosis Date  . Hypertension   . Acid reflux     Oncology Hx:    Ovarian cancer, bilateral   03/23/2014  Initial Diagnosis Pelvic mass   03/31/2014 Surgery IIIA1(i) clear cell carcinoma of the ovary    Family Hx:  Family History  Problem Relation Age of Onset  . Cancer Brother 83    lung smoker  . Cancer Paternal Aunt 40    breast  . Cancer Maternal Grandmother 25    leukemia  . Cancer Paternal Grandmother     colorectal at unknown age   her brother died this past summer of lung cancer. He was a heavy smoker  Vitals:  There were no vitals taken for this visit.  Physical Exam: Well-nourished well-developed female in no acute distress.  Abdomen: Well-healed vertical midline incision. Abdomen is soft and nontender. There is no appreciable hernias. Resonant to percuss. No palpable masses  Pelvic: Normal female genitalia. No vaginal masses. Rectovaginal exam reveals no RV septal nodularity, mass or stricture not appreciated.  Donaciano Eva, MD 09/20/2014, 4:46 PM

## 2014-09-21 ENCOUNTER — Telehealth: Payer: Self-pay | Admitting: *Deleted

## 2014-09-21 ENCOUNTER — Telehealth: Payer: Self-pay | Admitting: Oncology

## 2014-09-21 NOTE — Telephone Encounter (Signed)
PT. WAS GIVEN DR.LIVESAY'S SCHEDULER'S PHONE NUMBER TO CHANGE TOMORROW'S FLUSH APPOINTMENT TIME.

## 2014-09-21 NOTE — Telephone Encounter (Signed)
Confirmed appointment with Husband for 08/26

## 2014-09-21 NOTE — Telephone Encounter (Signed)
returned call and s.w. pt and r/s time of flush....pt ok and aware of new time

## 2014-09-21 NOTE — Telephone Encounter (Signed)
Per staff message and POF I have scheduled appts. Advised scheduler of appts. JMW  

## 2014-09-22 ENCOUNTER — Telehealth: Payer: Self-pay | Admitting: Oncology

## 2014-09-22 ENCOUNTER — Ambulatory Visit (HOSPITAL_BASED_OUTPATIENT_CLINIC_OR_DEPARTMENT_OTHER): Payer: Managed Care, Other (non HMO)

## 2014-09-22 ENCOUNTER — Encounter: Payer: Self-pay | Admitting: Oncology

## 2014-09-22 ENCOUNTER — Other Ambulatory Visit: Payer: Self-pay | Admitting: Oncology

## 2014-09-22 DIAGNOSIS — Z452 Encounter for adjustment and management of vascular access device: Secondary | ICD-10-CM | POA: Diagnosis not present

## 2014-09-22 DIAGNOSIS — C562 Malignant neoplasm of left ovary: Secondary | ICD-10-CM | POA: Diagnosis not present

## 2014-09-22 DIAGNOSIS — C561 Malignant neoplasm of right ovary: Secondary | ICD-10-CM

## 2014-09-22 MED ORDER — SODIUM CHLORIDE 0.9 % IJ SOLN
10.0000 mL | INTRAMUSCULAR | Status: DC | PRN
  Administered 2014-09-22: 10 mL via INTRAVENOUS
  Filled 2014-09-22: qty 10

## 2014-09-22 MED ORDER — HEPARIN SOD (PORK) LOCK FLUSH 100 UNIT/ML IV SOLN
500.0000 [IU] | Freq: Once | INTRAVENOUS | Status: AC
  Administered 2014-09-22: 500 [IU] via INTRAVENOUS
  Filled 2014-09-22: qty 5

## 2014-09-22 NOTE — Patient Instructions (Signed)
PICC Home Guide A peripherally inserted central catheter (PICC) is a long, thin, flexible tube that is inserted into a vein in the upper arm. It is a form of intravenous (IV) access. It is considered to be a "central" line because the tip of the PICC ends in a large vein in your chest. This large vein is called the superior vena cava (SVC). The PICC tip ends in the SVC because there is a lot of blood flow in the SVC. This allows medicines and IV fluids to be quickly distributed throughout the body. The PICC is inserted using a sterile technique by a specially trained nurse or physician. After the PICC is inserted, a chest X-ray exam is done to be sure it is in the correct place.  A PICC may be placed for different reasons, such as:  To give medicines and liquid nutrition that can only be given through a central line. Examples are:  Certain antibiotic treatments.  Chemotherapy.  Total parenteral nutrition (TPN).  To take frequent blood samples.  To give IV fluids and blood products.  If there is difficulty placing a peripheral intravenous (PIV) catheter. If taken care of properly, a PICC can remain in place for several months. A PICC can also allow a person to go home from the hospital early. Medicine and PICC care can be managed at home by a family member or home health care team. WHAT PROBLEMS CAN HAPPEN WHEN I HAVE A PICC? Problems with a PICC can occasionally occur. These may include the following:  A blood clot (thrombus) forming in or at the tip of the PICC. This can cause the PICC to become clogged. A clot-dissolving medicine called tissue plasminogen activator (tPA) can be given through the PICC to help break up the clot.  Inflammation of the vein (phlebitis) in which the PICC is placed. Signs of inflammation may include redness, pain at the insertion site, red streaks, or being able to feel a "cord" in the vein where the PICC is located.  Infection in the PICC or at the insertion  site. Signs of infection may include fever, chills, redness, swelling, or pus drainage from the PICC insertion site.  PICC movement (malposition). The PICC tip may move from its original position due to excessive physical activity, forceful coughing, sneezing, or vomiting.  A break or cut in the PICC. It is important to not use scissors near the PICC.  Nerve or tendon irritation or injury during PICC insertion. WHAT SHOULD I KEEP IN MIND ABOUT ACTIVITIES WHEN I HAVE A PICC?  You may bend your arm and move it freely. If your PICC is near or at the bend of your elbow, avoid activity with repeated motion at the elbow.  Rest at home for the remainder of the day following PICC line insertion.  Avoid lifting heavy objects as instructed by your health care provider.  Avoid using a crutch with the arm on the same side as your PICC. You may need to use a walker. WHAT SHOULD I KNOW ABOUT MY PICC DRESSING?  Keep your PICC bandage (dressing) clean and dry to prevent infection.  Ask your health care provider when you may shower. Ask your health care provider to teach you how to wrap the PICC when you do take a shower.  Change the PICC dressing as instructed by your health care provider.  Change your PICC dressing if it becomes loose or wet. WHAT SHOULD I KNOW ABOUT PICC CARE?  Check the PICC insertion site   daily for leakage, redness, swelling, or pain.  Do not take a bath, swim, or use hot tubs when you have a PICC. Cover PICC line with clear plastic wrap and tape to keep it dry while showering.  Flush the PICC as directed by your health care provider. Let your health care provider know right away if the PICC is difficult to flush or does not flush. Do not use force to flush the PICC.  Do not use a syringe that is less than 10 mL to flush the PICC.  Never pull or tug on the PICC.  Avoid blood pressure checks on the arm with the PICC.  Keep your PICC identification card with you at all  times.  Do not take the PICC out yourself. Only a trained clinical professional should remove the PICC. SEEK IMMEDIATE MEDICAL CARE IF:  Your PICC is accidentally pulled all the way out. If this happens, cover the insertion site with a bandage or gauze dressing. Do not throw the PICC away. Your health care provider will need to inspect it.  Your PICC was tugged or pulled and has partially come out. Do not  push the PICC back in.  There is any type of drainage, redness, or swelling where the PICC enters the skin.  You cannot flush the PICC, it is difficult to flush, or the PICC leaks around the insertion site when it is flushed.  You hear a "flushing" sound when the PICC is flushed.  You have pain, discomfort, or numbness in your arm, shoulder, or jaw on the same side as the PICC.  You feel your heart "racing" or skipping beats.  You notice a hole or tear in the PICC.  You develop chills or a fever. MAKE SURE YOU:   Understand these instructions.  Will watch your condition.  Will get help right away if you are not doing well or get worse. Document Released: 07/20/2002 Document Revised: 05/30/2013 Document Reviewed: 09/20/2012 ExitCare Patient Information 2015 [employer]. This information is not intended to replace advice given to you by your health care provider. Make sure you discuss any questions you have with your health care provider.  

## 2014-09-22 NOTE — Progress Notes (Unsigned)
Spoke with patient concerning the authorization of her Pet Scan.  Patient voiced approval of authorization.

## 2014-09-22 NOTE — Telephone Encounter (Signed)
MEDICAL ONCOLOGY  Peer to peer required by [place] for requested PET. Information given to [place] by this physician, approved. Authorization # [id number]  Dates not available to the physician reviewer "generally good for 3-6 weeks"  [facility] financial staff notified.  Godfrey Pick, MD

## 2014-09-22 NOTE — Progress Notes (Unsigned)
Left my named and number for this patient, RE: Pet Scan has been authorized.

## 2014-09-25 ENCOUNTER — Ambulatory Visit: Payer: Self-pay | Admitting: Oncology

## 2014-09-26 ENCOUNTER — Telehealth: Payer: Self-pay

## 2014-09-26 ENCOUNTER — Ambulatory Visit (HOSPITAL_BASED_OUTPATIENT_CLINIC_OR_DEPARTMENT_OTHER): Payer: Managed Care, Other (non HMO)

## 2014-09-26 ENCOUNTER — Other Ambulatory Visit (HOSPITAL_BASED_OUTPATIENT_CLINIC_OR_DEPARTMENT_OTHER): Payer: Managed Care, Other (non HMO)

## 2014-09-26 DIAGNOSIS — C562 Malignant neoplasm of left ovary: Secondary | ICD-10-CM

## 2014-09-26 DIAGNOSIS — C563 Malignant neoplasm of bilateral ovaries: Secondary | ICD-10-CM

## 2014-09-26 DIAGNOSIS — C561 Malignant neoplasm of right ovary: Secondary | ICD-10-CM | POA: Diagnosis not present

## 2014-09-26 DIAGNOSIS — Z452 Encounter for adjustment and management of vascular access device: Secondary | ICD-10-CM

## 2014-09-26 LAB — CBC WITH DIFFERENTIAL/PLATELET
BASO%: 0.4 % (ref 0.0–2.0)
Basophils Absolute: 0 10*3/uL (ref 0.0–0.1)
EOS%: 2.3 % (ref 0.0–7.0)
Eosinophils Absolute: 0.1 10*3/uL (ref 0.0–0.5)
HCT: 28.2 % — ABNORMAL LOW (ref 34.8–46.6)
HGB: 9.7 g/dL — ABNORMAL LOW (ref 11.6–15.9)
LYMPH%: 45.9 % (ref 14.0–49.7)
MCH: 33.9 pg (ref 25.1–34.0)
MCHC: 34.6 g/dL (ref 31.5–36.0)
MCV: 98.2 fL (ref 79.5–101.0)
MONO#: 0.5 10*3/uL (ref 0.1–0.9)
MONO%: 13.6 % (ref 0.0–14.0)
NEUT#: 1.5 10*3/uL (ref 1.5–6.5)
NEUT%: 37.8 % — ABNORMAL LOW (ref 38.4–76.8)
Platelets: 270 10*3/uL (ref 145–400)
RBC: 2.87 10*6/uL — ABNORMAL LOW (ref 3.70–5.45)
RDW: 20.2 % — ABNORMAL HIGH (ref 11.2–14.5)
WBC: 4 10*3/uL (ref 3.9–10.3)
lymph#: 1.8 10*3/uL (ref 0.9–3.3)

## 2014-09-26 MED ORDER — HEPARIN SOD (PORK) LOCK FLUSH 100 UNIT/ML IV SOLN
500.0000 [IU] | Freq: Once | INTRAVENOUS | Status: AC
  Administered 2014-09-26: 250 [IU] via INTRAVENOUS
  Filled 2014-09-26: qty 5

## 2014-09-26 MED ORDER — SODIUM CHLORIDE 0.9 % IJ SOLN
10.0000 mL | INTRAMUSCULAR | Status: DC | PRN
  Administered 2014-09-26: 10 mL via INTRAVENOUS
  Filled 2014-09-26: qty 10

## 2014-09-26 NOTE — Patient Instructions (Signed)
PICC Home Guide A peripherally inserted central catheter (PICC) is a long, thin, flexible tube that is inserted into a vein in the upper arm. It is a form of intravenous (IV) access. It is considered to be a "central" line because the tip of the PICC ends in a large vein in your chest. This large vein is called the superior vena cava (SVC). The PICC tip ends in the SVC because there is a lot of blood flow in the SVC. This allows medicines and IV fluids to be quickly distributed throughout the body. The PICC is inserted using a sterile technique by a specially trained nurse or physician. After the PICC is inserted, a chest X-ray exam is done to be sure it is in the correct place.  A PICC may be placed for different reasons, such as:  To give medicines and liquid nutrition that can only be given through a central line. Examples are:  Certain antibiotic treatments.  Chemotherapy.  Total parenteral nutrition (TPN).  To take frequent blood samples.  To give IV fluids and blood products.  If there is difficulty placing a peripheral intravenous (PIV) catheter. If taken care of properly, a PICC can remain in place for several months. A PICC can also allow a person to go home from the hospital early. Medicine and PICC care can be managed at home by a family member or home health care team. WHAT PROBLEMS CAN HAPPEN WHEN I HAVE A PICC? Problems with a PICC can occasionally occur. These may include the following:  A blood clot (thrombus) forming in or at the tip of the PICC. This can cause the PICC to become clogged. A clot-dissolving medicine called tissue plasminogen activator (tPA) can be given through the PICC to help break up the clot.  Inflammation of the vein (phlebitis) in which the PICC is placed. Signs of inflammation may include redness, pain at the insertion site, red streaks, or being able to feel a "cord" in the vein where the PICC is located.  Infection in the PICC or at the insertion  site. Signs of infection may include fever, chills, redness, swelling, or pus drainage from the PICC insertion site.  PICC movement (malposition). The PICC tip may move from its original position due to excessive physical activity, forceful coughing, sneezing, or vomiting.  A break or cut in the PICC. It is important to not use scissors near the PICC.  Nerve or tendon irritation or injury during PICC insertion. WHAT SHOULD I KEEP IN MIND ABOUT ACTIVITIES WHEN I HAVE A PICC?  You may bend your arm and move it freely. If your PICC is near or at the bend of your elbow, avoid activity with repeated motion at the elbow.  Rest at home for the remainder of the day following PICC line insertion.  Avoid lifting heavy objects as instructed by your health care provider.  Avoid using a crutch with the arm on the same side as your PICC. You may need to use a walker. WHAT SHOULD I KNOW ABOUT MY PICC DRESSING?  Keep your PICC bandage (dressing) clean and dry to prevent infection.  Ask your health care provider when you may shower. Ask your health care provider to teach you how to wrap the PICC when you do take a shower.  Change the PICC dressing as instructed by your health care provider.  Change your PICC dressing if it becomes loose or wet. WHAT SHOULD I KNOW ABOUT PICC CARE?  Check the PICC insertion site   daily for leakage, redness, swelling, or pain.  Do not take a bath, swim, or use hot tubs when you have a PICC. Cover PICC line with clear plastic wrap and tape to keep it dry while showering.  Flush the PICC as directed by your health care provider. Let your health care provider know right away if the PICC is difficult to flush or does not flush. Do not use force to flush the PICC.  Do not use a syringe that is less than 10 mL to flush the PICC.  Never pull or tug on the PICC.  Avoid blood pressure checks on the arm with the PICC.  Keep your PICC identification card with you at all  times.  Do not take the PICC out yourself. Only a trained clinical professional should remove the PICC. SEEK IMMEDIATE MEDICAL CARE IF:  Your PICC is accidentally pulled all the way out. If this happens, cover the insertion site with a bandage or gauze dressing. Do not throw the PICC away. Your health care provider will need to inspect it.  Your PICC was tugged or pulled and has partially come out. Do not  push the PICC back in.  There is any type of drainage, redness, or swelling where the PICC enters the skin.  You cannot flush the PICC, it is difficult to flush, or the PICC leaks around the insertion site when it is flushed.  You hear a "flushing" sound when the PICC is flushed.  You have pain, discomfort, or numbness in your arm, shoulder, or jaw on the same side as the PICC.  You feel your heart "racing" or skipping beats.  You notice a hole or tear in the PICC.  You develop chills or a fever. MAKE SURE YOU:   Understand these instructions.  Will watch your condition.  Will get help right away if you are not doing well or get worse. Document Released: 07/20/2002 Document Revised: 05/30/2013 Document Reviewed: 09/20/2012 ExitCare Patient Information 2015 [employer]. This information is not intended to replace advice given to you by your health care provider. Make sure you discuss any questions you have with your health care provider.  

## 2014-09-26 NOTE — Telephone Encounter (Signed)
Told Audrey Porter that her platelets  are up to 270 K today. She can have her PICC removed to morrow as scheduled after her Pet scan.  [name] verbalized understanding.

## 2014-09-27 ENCOUNTER — Ambulatory Visit (HOSPITAL_COMMUNITY)
Admission: RE | Admit: 2014-09-27 | Discharge: 2014-09-27 | Disposition: A | Discharge status: Home / Self Care | Payer: Managed Care, Other (non HMO) | Source: Ambulatory Visit | Attending: Oncology | Admitting: Oncology

## 2014-09-27 ENCOUNTER — Telehealth: Payer: Self-pay | Admitting: Gynecologic Oncology

## 2014-09-27 ENCOUNTER — Encounter: Payer: Self-pay | Admitting: Oncology

## 2014-09-27 ENCOUNTER — Ambulatory Visit (HOSPITAL_BASED_OUTPATIENT_CLINIC_OR_DEPARTMENT_OTHER): Payer: Managed Care, Other (non HMO)

## 2014-09-27 VITALS — BP 134/64 | HR 67 | Temp 97.2°F | Resp 16

## 2014-09-27 DIAGNOSIS — C562 Malignant neoplasm of left ovary: Secondary | ICD-10-CM | POA: Diagnosis present

## 2014-09-27 DIAGNOSIS — N135 Crossing vessel and stricture of ureter without hydronephrosis: Secondary | ICD-10-CM | POA: Diagnosis not present

## 2014-09-27 DIAGNOSIS — Z9221 Personal history of antineoplastic chemotherapy: Secondary | ICD-10-CM | POA: Insufficient documentation

## 2014-09-27 DIAGNOSIS — D6959 Other secondary thrombocytopenia: Secondary | ICD-10-CM | POA: Insufficient documentation

## 2014-09-27 DIAGNOSIS — D6481 Anemia due to antineoplastic chemotherapy: Secondary | ICD-10-CM | POA: Insufficient documentation

## 2014-09-27 DIAGNOSIS — Z08 Encounter for follow-up examination after completed treatment for malignant neoplasm: Secondary | ICD-10-CM | POA: Insufficient documentation

## 2014-09-27 DIAGNOSIS — C561 Malignant neoplasm of right ovary: Secondary | ICD-10-CM

## 2014-09-27 DIAGNOSIS — K6389 Other specified diseases of intestine: Secondary | ICD-10-CM | POA: Diagnosis not present

## 2014-09-27 DIAGNOSIS — C563 Malignant neoplasm of bilateral ovaries: Secondary | ICD-10-CM

## 2014-09-27 DIAGNOSIS — I10 Essential (primary) hypertension: Secondary | ICD-10-CM | POA: Diagnosis not present

## 2014-09-27 DIAGNOSIS — T451X5A Adverse effect of antineoplastic and immunosuppressive drugs, initial encounter: Secondary | ICD-10-CM | POA: Insufficient documentation

## 2014-09-27 DIAGNOSIS — K219 Gastro-esophageal reflux disease without esophagitis: Secondary | ICD-10-CM | POA: Insufficient documentation

## 2014-09-27 LAB — GLUCOSE, CAPILLARY: Glucose-Capillary: 73 mg/dL (ref 65–99)

## 2014-09-27 MED ORDER — FLUDEOXYGLUCOSE F - 18 (FDG) INJECTION
9.7800 | Freq: Once | INTRAVENOUS | Status: DC | PRN
Start: 2014-09-27 — End: 2014-10-03
  Administered 2014-09-27: 9.78 via INTRAVENOUS
  Filled 2014-09-27: qty 9.78

## 2014-09-27 NOTE — Progress Notes (Signed)
1537 picc dc'd per orders. 38 cm mark noted. vaseline gauze and gauze dressing applied. 5 minutes pressure held. After care instructions verbally and printed given to pt. 30 minutes observation with pt laying supine done. Dressing CDI when pt dc'd home.

## 2014-09-27 NOTE — Patient Instructions (Signed)
PICC Removal, Care After   Refer to this sheet in the next few weeks. These instructions provide you with information on caring for yourself after your procedure. Your health care provider may also give you more specific instructions. Your treatment has been planned according to current medical practices, but problems sometimes occur. Call your health care provider if you have any problems or questions after your procedure.  WHAT TO EXPECT AFTER THE PROCEDURE  After your procedure, it is typical to have mild discomfort at the insertion site. This should not last for more than a day.  HOME CARE INSTRUCTIONS  You may remove the bandage after 24 hours. The PICC insertion site is very small. A small scab may develop over the insertion site.   It is okay to wash the site gently with soap and water. Be careful not to remove or pick off the scab. Gently pat the site dry after washing it. You do not need to put another bandage over the insertion site.  Do not lift anything heavy or do strenuous physical activity for 24 hours after the PICC is removed. This includes:   Weight lifting.   Strenuous yard work.   Any physical activity with repetitive arm movement.   SEEK MEDICAL CARE IF:    You have swelling or puffiness in your arm at the PICC insertion site.   You have increasing tenderness at the PICC insertion site.  SEEK IMMEDIATE MEDICAL CARE IF:    You have numbness or tingling in your fingers, hand, or arm.   Your arm looks blue and feels cold.   You have redness around the insertion site or a red streak goes up your arm.   You have any type of drainage from the PICC insertion site. This includes drainage such as:   Bleeding from the insertion site. If this happens, apply firm, direct pressure to the PICC insertion site with a clean towel.   Drainage that is yellow or tan.   You have a fever.  Document Released: 01/18/2013 Document Reviewed: 01/18/2013  Exit[employer] Patient Information 2015 [employer].  This information is not intended to replace advice given to you by your health care provider. Make sure you discuss any questions you have with your health care provider.

## 2014-09-27 NOTE — Progress Notes (Signed)
I faxed office notes to  [phone] [facility]

## 2014-09-27 NOTE — Telephone Encounter (Signed)
LM for patient that PET scan does not reveal any hypermetabolic areas near the transverse colon where the nodules were or in the pelvis where the stricture it. In the setting of this PET scan and normal CA-125, I would favor follow up pending her colonoscopy being normal tomorrow. She can call with questions but I will see her back on 10/5 with labs prior. I will let Drs. Marko Plume and Denman George know.  PG

## 2014-10-18 ENCOUNTER — Encounter: Payer: Self-pay | Admitting: Gynecologic Oncology

## 2014-10-18 ENCOUNTER — Ambulatory Visit: Payer: Managed Care, Other (non HMO)

## 2014-10-18 ENCOUNTER — Telehealth: Payer: Self-pay | Admitting: Oncology

## 2014-10-18 ENCOUNTER — Ambulatory Visit: Payer: Managed Care, Other (non HMO) | Attending: Gynecologic Oncology | Admitting: Gynecologic Oncology

## 2014-10-18 ENCOUNTER — Other Ambulatory Visit (HOSPITAL_BASED_OUTPATIENT_CLINIC_OR_DEPARTMENT_OTHER): Payer: Managed Care, Other (non HMO)

## 2014-10-18 ENCOUNTER — Telehealth: Payer: Self-pay

## 2014-10-18 VITALS — BP 116/83 | HR 75 | Temp 98.7°F | Resp 18 | Ht 64.0 in | Wt 202.8 lb

## 2014-10-18 DIAGNOSIS — Z1589 Genetic susceptibility to other disease: Secondary | ICD-10-CM

## 2014-10-18 DIAGNOSIS — C569 Malignant neoplasm of unspecified ovary: Secondary | ICD-10-CM

## 2014-10-18 DIAGNOSIS — C562 Malignant neoplasm of left ovary: Secondary | ICD-10-CM | POA: Diagnosis not present

## 2014-10-18 DIAGNOSIS — C561 Malignant neoplasm of right ovary: Secondary | ICD-10-CM

## 2014-10-18 DIAGNOSIS — C563 Malignant neoplasm of bilateral ovaries: Secondary | ICD-10-CM

## 2014-10-18 LAB — CBC WITH DIFFERENTIAL/PLATELET
BASO%: 0.7 % (ref 0.0–2.0)
Basophils Absolute: 0.1 10*3/uL (ref 0.0–0.1)
EOS ABS: 0.4 10*3/uL (ref 0.0–0.5)
EOS%: 5.2 % (ref 0.0–7.0)
HCT: 33.4 % — ABNORMAL LOW (ref 34.8–46.6)
HGB: 11.6 g/dL (ref 11.6–15.9)
LYMPH%: 36.2 % (ref 14.0–49.7)
MCH: 32.8 pg (ref 25.1–34.0)
MCHC: 34.7 g/dL (ref 31.5–36.0)
MCV: 94.4 fL (ref 79.5–101.0)
MONO#: 0.8 10*3/uL (ref 0.1–0.9)
MONO%: 10.9 % (ref 0.0–14.0)
NEUT%: 47 % (ref 38.4–76.8)
NEUTROS ABS: 3.2 10*3/uL (ref 1.5–6.5)
PLATELETS: 265 10*3/uL (ref 145–400)
RBC: 3.54 10*6/uL — AB (ref 3.70–5.45)
RDW: 14.4 % (ref 11.2–14.5)
WBC: 6.9 10*3/uL (ref 3.9–10.3)
lymph#: 2.5 10*3/uL (ref 0.9–3.3)
nRBC: 0 % (ref 0–0)

## 2014-10-18 NOTE — Telephone Encounter (Signed)
LM stating the information noted below by Dr. Marko Plume regarding lab results and Flu vaccine for this year.

## 2014-10-18 NOTE — Telephone Encounter (Signed)
Dr Dola Argyle appointment moved per dr Sharyon Medicus avs to patient per [facility]

## 2014-10-18 NOTE — Patient Instructions (Signed)
Please follow-up with Dr. Marko Plume in 2-3 months in follow-up with Dr. Alycia Rossetti 2-3 months after your appointment with Dr. Marko Plume. We will notify you of the results of her CA-125.

## 2014-10-18 NOTE — Telephone Encounter (Signed)
-----   Message from Gordy Levan, MD sent at 10/18/2014  2:33 PM EDT ----- Labs seen and need follow up: please let her know hemoglobin and white blood cells are much better today. I saw Dr Elenora Gamma note and am glad she is doing better. Remind her that she needs flu vaccine before end of Oct, here or elsewhere is fine    thanks

## 2014-10-18 NOTE — Progress Notes (Signed)
Followup Note: Gyn-Onc  Audrey Porter 54 y.o. female  CC:  Chief Complaint  Patient presents with  . pelvic mass    Assessment and Plan: Audrey Porter is a 54 y.o. 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 There was concern that she had a persistent disease however her PET scan was negative, should a normal CA-125 and subsequent her colonoscopy was unremarkable. We will follow-up in results for CA-125 from today. She'll follow-up with Dr. Marko Plume in 2-3 months and return to see me to 3 months after that appointment. I will notify her of the results of her CA-125.  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 She was given a copy of her results from [facility] and she will take them to her daughter states that she can review that with her physicians.   HPI: Patient is seen at the request of Dr. Evie Lacks. Her primary care physician is Dr. Gar Ponto.  Patient is a 54 year old gravida 1 para 1 who thought that she was gaining weight for the past few months. She states that in January she had a urinary tract infection or so she thought. She started feeling some increase pressure and immediately after voiding felt like she still needed to void again and was having only small amounts of urine. She was seen by her primary care physician's office and she was diagnosed with a urinary tract infection and provided antibiotics; however, she was contacted that the urine culture was negative and her symptoms had not abated. She ultimately sought care for this. In retrospect she thinks that this is been developing an ongoing for several months.   She underwent a CT scan on 03/16/2014. It revealed the lung bases were clear. The liver enhances with no focal abnormality. There was perihepatic ascites present. The gallbladder was surgically absent. The pancreas, adrenal glands, spleen, stomach, kidneys were unremarkable. There was no evidence of any peritoneal nodularity or carcinomatosis.  There is no lymphadenopathy. Within the pelvis there was a 9.9 x 20.1 x 17.8 large lobular mass which was septated and primarily cystic. However, the mass does contain some soft tissue component. The uterus could not be visualized. The urinary bladder was compressed by the large septated lobular mass.   Transvaginal ultrasound was performed on February 22. The uterus was 8.8 x 2.8 x 4.0 cm with no fibroids or other masses visualized. The endometrium was approximately 12 mm in thickness. The right ovary and left ovary were difficult to definitively leave visualize. Within the pelvis was a 20 x 19 x 9 cm solid and cystic mass which filled the pelvic pelvis. There were large solid components with internal blood flow. There was a 1.5 cm bladder stone that was incidentally noted. CA-125 was performed that was 56.1. CEA was 0.6. All other labs were remarkable only for a creatinine of 1.44 and hematocrit of 32.7.   Oncology Summary:    Ovarian cancer (RAF-HCC)    02/2014  Initial Diagnosis  Presented with weight gain, pelvic pressure, urinary frequency. CT: 20cm septate, cystic mass, ascites. CA-125 56.1.    03/31/2014  Surgery  TAH/BSO, BPLND, RPALND, omentectomy, ureterolysis. Findings: Bilat complex ovarian masses, dense adhesions, intraop spill of R cyst contents. R0 resection. Path: clear cell CA involving both ovaries, 1 R pelvic LN shows microscopic (61m) met.     Pathology: Diagnosis: FSA, A: Ovary and fallopian tube, left, left salpingo-oophorectomy  Histologic type: Clear cell carcinoma (see comment)   Histologic grade: high  grade/grade 3   Tumor site: (ovary/primary peritoneal) ovary   Tumor size: (greatest dimension) 13 x 9.5 x 6.2 cm   Ovarian surface involvement: not identified   Status of capsule: (intact/ruptured) intact   Extent of involvement of other tissues/organs (select all that apply):   Right ovary  Left ovary  Lymphovascular space invasion: present   Regional lymph  nodes (see other specimens):  Total number involved: 1  Total number examined: 10 Size of largest metastasis: microscopic focus only, approximately 1 mm;  necrosis, fibrosis, and foreign body type giant cell reaction present within two additional lymph nodes, changes suggestive of treatment effect or regression   Additional pathologic findings: none   AJCC Pathologic Stage: pT3a1(i) pN1 pMx FIGO (2014classification) Stage Grouping: IIIA1(i)  Note: This pathologic stage assessment is based on information available at the time of this report, and is subject to change pending clinical review and additional information.  B: Ovary and fallopian tube, right, right salpingo-oophorectomy  Histologic type: clear cell carcinoma (B8)  Histologic grade: high grade/grade 3   Tumor site: (ovary/primary peritoneal) ovary   Tumor size: (greatest dimension) 11 x 9 x 4.5 cm   Ovarian surface involvement: not identified   Status of capsule: (intact/ruptured) ruptured   C: Uterus and cervix, hysterectomy  Cervix  - No tumor seen   Endometrium  - Atrophic endometrium   Myometrium  - Adenomyosis and subserosal endometriosis   D: Omentum, omentectomy - No tumor seen   E: Lymph nodes, right periaortic, regional dissection - Three lymph nodes, no tumor seen (0/3)   F: Lymph nodes, right pelvic, regional dissection - 1 out of 6 lymph nodes shows minute (approximately 2 mm) focus of metastatic adenocarcinoma (1/6) - Two additional lymph nodes show necrosis, fibrosis, and foreign body type giant cell reaction suggestive of treatment effect versus regression   G: Lymph node, left pelvic, regional dissection - One lymph node, no tumor seen (0/1)   COMMENT: Multiple immunostains are performed on block A12 to further evaluate the case for metastasis to the bilateral ovaries form a distant site and to evaluate for mucinous versus clear cell change. The tumor shows focal intracytoplasmic  mucin on mucin stains, and immunostains shows the tumor to be cytokeratin 7 positive, cytokeratin 20 negative, CDX2 negative, PAX8 positive, and negative for chromogranin, synaptophysin, TTF1, p53, ER, and PR, and rare positive cells on CA125, with low p53 (approximately 5%). Napsin is positive. RCC and CD10 are negative. PAS is positive and the positive staining does not digest with PASD. These findings supporting a tumor of primary gynecologic origin. Overall, a clear cell carcinoma is favored. Seromucinous features cannot be excluded based on the presence of scattered, faint intracytoplasmic mucin noted on mucin stain and the morphology with focal signet ring cells. Although uncommon, this has been described, however, in clear cell carcinomas, therefore the above classification is favored.     She was dispositioned to receive adjuvant carboplatin and paclitaxel x 6 cycles which she completed on 09/01/14. Therapy was complicated by neutropenia requiring GCSF support and anemia requiring transfusions.   CA 125 normalized to 5U/mL on 09/14/14 after completion of therapy.  Post treatment CT  imaging performed on 09/14/14 revealed "stricturing of the colon at the junction of the sigmoid colon and rectum. This narrowing occurs over a 3.5cm segment". Three nodules were adherent to the transverse colon and were concerning for serosal metastasis.   PET 09/27/14: IMPRESSION: 1. No abnormal hypermetabolism above normal colon at the rectosigmoid junction,  at the site of suspected mass in narrowing on 09/14/2014. 2. Small soft tissue nodules along the distal transverse colon do not show abnormal hypermetabolism but may be too small for PET resolution.  She was seen by gastroenterology (Dr. Anthony Sar) and had a negative colonoscopy September 9. Per report there were no polyps. It appears that there was some congestion and edema of the bowel wall but no mucosal irregularities. Of note at the time when there  was a concern for recurrence her CA-125 was normal at 5. She comes in today for follow-up. She's overall doing quite well and really has no complaints whatsoever. 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 She has tumor flats cannot were high heels. Her appetite is very good. She did have a CBC today that revealed her hematocrit to be up to 33.  Review of Systems  Constitutional:Denies fever. No nausea or vomiting. No early satiety. Skin: No rash Cardiovascular: No chest pain, shortness of breath, or edema  Pulmonary: No cough  Gastro Intestinal: No nausea, vomiting, constipation, or diarrhea reported. No change in bowel movement.  Genitourinary:  Denies vaginal bleeding and discharge.  Musculoskeletal: No myalgia, arthralgia, joint swelling or pain.  Neurologic: No weakness, +numbness as above Psychology: No changes  Current Meds:  Outpatient Encounter Prescriptions as of 10/18/2014  Medication Sig  . cetirizine (ZYRTEC) 5 MG tablet Take 5 mg by mouth daily.  Marland Kitchen docusate sodium (COLACE) 100 MG capsule Take 100 mg by mouth daily.   . ferrous fumarate (HEMOCYTE - 106 MG FE) 325 (106 FE) MG TABS tablet Take 1 tablet po daily on an empty stomach with OJ.  . fluticasone (FLONASE) 50 MCG/ACT nasal spray Place 1 spray into both nostrils daily.   Marland Kitchen ibuprofen (ADVIL,MOTRIN) 200 MG tablet Take 200-400 mg by mouth every 8 (eight) hours as needed.  . loperamide (IMODIUM A-D) 2 MG tablet Take 2 mg by mouth 4 (four) times daily as needed for diarrhea or loose stools.  Marland Kitchen losartan-hydrochlorothiazide (HYZAAR) 100-25 MG per tablet Take 1 tablet by mouth daily. Pt takes this medication needed depending on her BP checks.  . Multiple Vitamins-Minerals (CENTRUM SILVER ULTRA WOMENS PO) Take 1 tablet by mouth daily.  Marland Kitchen nystatin-triamcinolone (MYCOLOG II) cream Apply 1 application topically daily. Per pt, applies under both breasts after showering  . omeprazole (PRILOSEC)  20 MG capsule Take 20 mg by mouth at bedtime.  Marland Kitchen LORazepam (ATIVAN) 0.5 MG tablet Place 1 tablet under the tongue or swallow every 6 hours as needed for nausea or vomiting.  Will make drowsy. (Patient not taking: Reported on 10/18/2014)  . ondansetron (ZOFRAN) 8 MG tablet Take 1 tablet (8 mg total) by mouth every 8 (eight) hours as needed for nausea or vomiting. Will not make drowsy (Patient not taking: Reported on 10/18/2014)  . polyethylene glycol (MIRALAX / GLYCOLAX) packet Take 17 g by mouth daily as needed.  . [DISCONTINUED] oxyCODONE (OXY IR/ROXICODONE) 5 MG immediate release tablet Take 1 tablet every 4-6 hrs as needed for aches (Patient not taking: Reported on 10/18/2014)   No facility-administered encounter medications on file as of 10/18/2014.    Allergy:  Allergies  Allergen Reactions  . Sulfa Antibiotics Hives    Social Hx:  She has a daughter who is 12. 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 Marital Status: Married    Spouse Name: N/A  . Number  of Children: N/A  . Years of Education: N/A   Occupational History  . Not on file.   Social History Main Topics  . Smoking status: Never Smoker   . Smokeless tobacco: Not on file  . Alcohol Use: No  . Drug Use: No  . Sexual Activity: No   Other Topics Concern  . Not on file   Social History Narrative    Past Surgical Hx:  Past Surgical History  Procedure Laterality Date  . Abdominal hysterectomy  03/31/14    at [facility]  . Cholecystectomy  2000  . Cesarean section  1990    Past Medical Hx:  Past Medical History  Diagnosis Date  . Hypertension   . 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 Cancer Brother 75    lung smoker   . Cancer Paternal Aunt 69    breast  . Cancer Maternal Grandmother 25    leukemia  . Cancer Paternal Grandmother     colorectal at unknown age   her brother died this past summer of lung cancer. He was a heavy smoker  Vitals:  Blood pressure 116/83, pulse 75, temperature 98.7 F (37.1 C), temperature source Oral, resp. rate 18, height '5\' 4"'  (1.626 m), weight 202 lb 12.8 oz (91.989 kg), SpO2 100 %.  Physical Exam: Well-nourished well-developed female in no acute distress.  Neck: Supple, no lymphadenopathy, no thyromegaly.  Lungs: Clear to auscultation bilaterally.  Cardiovascular: Regular rate and rhythm.  Abdomen: Vertical midline incision is well-healed. There is no appreciable hernia. Abdomen is soft, nontender, nondistended. There are no palpable masses. There is no fluid wave.  Groins: No lymphadenopathy.  Extremities: No edema.  Pelvic: Normal female genitalia. Bimanual exam reveals no masses or nodularity. Rectal confirms  GEHRIG,PAOLA A., MD 10/18/2014, 1:12 PM

## 2014-10-19 ENCOUNTER — Telehealth: Payer: Self-pay | Admitting: *Deleted

## 2014-10-19 LAB — CA 125: CA 125: 5 U/mL (ref <35)

## 2014-10-19 NOTE — Telephone Encounter (Signed)
Orders received from [place] , Elinor Parkinson , APNP to contact the patient and update with CA 125 test results obtained on 10/18/2014 being Normal. Spoke with Shaqueta and updated her with her results being within normal range. Patient states understanding and denies further questions or concerns at this time .

## 2014-11-01 ENCOUNTER — Ambulatory Visit: Payer: Self-pay | Admitting: Gynecologic Oncology

## 2014-11-16 ENCOUNTER — Other Ambulatory Visit: Payer: Self-pay

## 2014-11-16 ENCOUNTER — Ambulatory Visit: Payer: Self-pay | Admitting: Oncology

## 2015-01-07 ENCOUNTER — Other Ambulatory Visit: Payer: Self-pay | Admitting: Oncology

## 2015-01-07 DIAGNOSIS — C561 Malignant neoplasm of right ovary: Secondary | ICD-10-CM

## 2015-01-07 DIAGNOSIS — C563 Malignant neoplasm of bilateral ovaries: Secondary | ICD-10-CM

## 2015-01-07 DIAGNOSIS — C562 Malignant neoplasm of left ovary: Principal | ICD-10-CM

## 2015-01-08 ENCOUNTER — Other Ambulatory Visit (HOSPITAL_BASED_OUTPATIENT_CLINIC_OR_DEPARTMENT_OTHER): Payer: Managed Care, Other (non HMO)

## 2015-01-08 ENCOUNTER — Encounter: Payer: Self-pay | Admitting: Oncology

## 2015-01-08 ENCOUNTER — Ambulatory Visit (HOSPITAL_BASED_OUTPATIENT_CLINIC_OR_DEPARTMENT_OTHER): Payer: Managed Care, Other (non HMO)

## 2015-01-08 ENCOUNTER — Ambulatory Visit (HOSPITAL_BASED_OUTPATIENT_CLINIC_OR_DEPARTMENT_OTHER): Payer: Managed Care, Other (non HMO) | Admitting: Oncology

## 2015-01-08 ENCOUNTER — Telehealth: Payer: Self-pay | Admitting: Oncology

## 2015-01-08 VITALS — BP 120/64 | HR 68 | Temp 98.5°F | Resp 18 | Ht 64.0 in | Wt 210.5 lb

## 2015-01-08 DIAGNOSIS — C563 Malignant neoplasm of bilateral ovaries: Secondary | ICD-10-CM

## 2015-01-08 DIAGNOSIS — C562 Malignant neoplasm of left ovary: Secondary | ICD-10-CM | POA: Diagnosis not present

## 2015-01-08 DIAGNOSIS — C561 Malignant neoplasm of right ovary: Secondary | ICD-10-CM

## 2015-01-08 DIAGNOSIS — Z1239 Encounter for other screening for malignant neoplasm of breast: Secondary | ICD-10-CM

## 2015-01-08 DIAGNOSIS — R300 Dysuria: Secondary | ICD-10-CM

## 2015-01-08 LAB — COMPREHENSIVE METABOLIC PANEL
ALBUMIN: 4.2 g/dL (ref 3.5–5.0)
ALK PHOS: 56 U/L (ref 40–150)
ALT: 20 U/L (ref 0–55)
AST: 17 U/L (ref 5–34)
Anion Gap: 10 mEq/L (ref 3–11)
BILIRUBIN TOTAL: 0.44 mg/dL (ref 0.20–1.20)
BUN: 21.3 mg/dL (ref 7.0–26.0)
CO2: 25 mEq/L (ref 22–29)
CREATININE: 1.4 mg/dL — AB (ref 0.6–1.1)
Calcium: 9.6 mg/dL (ref 8.4–10.4)
Chloride: 104 mEq/L (ref 98–109)
EGFR: 51 mL/min/{1.73_m2} — ABNORMAL LOW (ref >90)
GLUCOSE: 85 mg/dL (ref 70–140)
Potassium: 3.9 mEq/L (ref 3.5–5.1)
SODIUM: 139 meq/L (ref 136–145)
TOTAL PROTEIN: 7.4 g/dL (ref 6.4–8.3)

## 2015-01-08 LAB — CBC WITH DIFFERENTIAL/PLATELET
BASO%: 0.7 % (ref 0.0–2.0)
Basophils Absolute: 0 10*3/uL (ref 0.0–0.1)
EOS ABS: 0.2 10*3/uL (ref 0.0–0.5)
EOS%: 3.9 % (ref 0.0–7.0)
HCT: 34.1 % — ABNORMAL LOW (ref 34.8–46.6)
HEMOGLOBIN: 11.5 g/dL — AB (ref 11.6–15.9)
LYMPH%: 40.3 % (ref 14.0–49.7)
MCH: 31 pg (ref 25.1–34.0)
MCHC: 33.8 g/dL (ref 31.5–36.0)
MCV: 91.8 fL (ref 79.5–101.0)
MONO#: 0.5 10*3/uL (ref 0.1–0.9)
MONO%: 9.5 % (ref 0.0–14.0)
NEUT%: 45.6 % (ref 38.4–76.8)
NEUTROS ABS: 2.5 10*3/uL (ref 1.5–6.5)
Platelets: 287 10*3/uL (ref 145–400)
RBC: 3.72 10*6/uL (ref 3.70–5.45)
RDW: 14.4 % (ref 11.2–14.5)
WBC: 5.6 10*3/uL (ref 3.9–10.3)
lymph#: 2.3 10*3/uL (ref 0.9–3.3)

## 2015-01-08 LAB — URINALYSIS, MICROSCOPIC - CHCC
BILIRUBIN (URINE): NEGATIVE
BLOOD: NEGATIVE
GLUCOSE UR CHCC: NEGATIVE mg/dL
KETONES: NEGATIVE mg/dL
Leukocyte Esterase: NEGATIVE
Nitrite: NEGATIVE
PH: 5 (ref 4.6–8.0)
PROTEIN: NEGATIVE mg/dL
SPECIFIC GRAVITY, URINE: 1.01 (ref 1.003–1.035)
Urobilinogen, UR: 0.2 mg/dL (ref 0.2–1)

## 2015-01-08 NOTE — Telephone Encounter (Signed)
Appointments made and avs printed for patient,patient will call for her mammo

## 2015-01-08 NOTE — Progress Notes (Signed)
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 Last imaging was CT AP 09-14-14 followed by PET 09-27-14, without definite metastatic or persistent disease. A follow up colonoscopy was unremarkable ~ 09-28-14 (that report not located in EMR now and will try to obtain it). She saw Dr Alycia Rossetti 10-18-14 and will see gyn onc again in ~3 months.   Patient has felt gradually stronger since completing chemotherapy, back working full time. She worries about slight dysuria, as this had been presenting symptoms with the cancer diagnosis. Appetite is good, bowels moving regularly, no abdominal or pelvic pain, no bleeding, no LE swelling. Peripheral neuropathy slightly better. No SOB or other respiratory symptoms. No fever or recent infectious illness. Remainder of 10 point Review of Systems negative/ unchanged.   PICC placed 08-04-14 CA 125 preoperatively 56, this 5 on 09-14-14. 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 Flu vaccine done fall 2016  She, husband and daughter are going on a cruise to [place] for [place]   Ovarian cancer, bilateral ([place])   03/23/2014 Initial Diagnosis Pelvic mass   03/31/2014 Surgery IIIA1(i) clear cell carcinoma of the ovary    - 09/01/2014 Chemotherapy Cycle #6 paclitaxel and carboplatin  Patient had been in usual good health, up to date on all medical care, when she developed bladder symptoms in early 2016. Urine culture showed no infection, then CT AP at [facility] 03-16-14 found perihepatic ascites and pelvic mass 9.9 x 20.1 x 17.8 cm which was cystic and solid, kidneys and ureters unremarkable, no  pelvic fluid, rectosigmoid partially compressed, other organs not remarkable, no mention of adenopathy (report scanned into this EMR). Past gyn care has been by Dr Alden Hipp, however she was seen promptly by Dr Evie Lacks on referral from Dr Quillian Quince after CT resulted; CT findings confirmed by transvaginal US. CA 125 was 56 pre op. She saw Dr Alycia Rossetti in consultation on 03-23-14, then had surgery at [facility] on 03-31-14 which was TAH/BSO, omentectomy, pelvic and paraaortic node evaluation. Surgical findings were of bilateral complex ovarian masses and dense adhesions, with intra-operative spill of right ovarian mass as that was densely adherent to sigmoid serosa; posterior uterine segment also adherent to rectosigmoid, and retroperitoneal fibrosis bilaterally. Pathology [facility] [phone]) found grade 3 clear cell carcinoma involving both ovaries, largest 13 x 9.5 x 6.2 cm, + LVSI and one right pelvic node with microscopic involvement approximately 1 mm, ER PR negative, pT3a1 pN1 pMx. Cytology from ascites [facility] [phone]) was positive for adenocarcinoma of gyn primary. Multidisciplinary conference at [facility] 04-19-14 recommended carboplatin and taxane chemotherapy. Cycle 1 carbo taxol was given on 05-12-14, with neutropenia by day 11 (ANC 0.8) despite having started granix on day 8.She was hospitalized at [facility] after cycle 3 with syncope related to dehydration. She completed cycle 6 on 09-01-14.    Objective:  Vital signs in last 24 hours:  BP 120/64 mmHg  Pulse 68  Temp(Src) 98.5 F (36.9 C) (Oral)  Resp 18  Ht '5\' 4"'  (1.626 m)  Wt 210 lb 8 oz (95.482 kg)  BMI 36.11 kg/m2  SpO2 100% Weight is up 8 lbs Alert, oriented and appropriate. Ambulatory without difficulty.    HEENT:PERRL,  sclerae not icteric. Oral mucosa moist without lesions, posterior pharynx clear.  Neck supple. No JVD.  Lymphatics:no cervical,supraclavicular, axillary or inguinal adenopathy Resp: clear to auscultation bilaterally and normal  percussion bilaterally Cardio: regular rate and rhythm. No gallop. GI: soft, nontender, not distended, no mass or organomegaly. Normally active bowel sounds. Surgical incision not remarkable. Musculoskeletal/ Extremities: without pitting edema, cords, tenderness Neuro: slight peripheral neuropathy feet. Otherwise nonfocal. PSYCH appropriate mood and affect Skin without rash, ecchymosis, petechiae Breasts: without dominant mass, skin or nipple findings. Axillae benign.   Lab Results:  Results for orders placed or performed in visit on 01/08/15  CBC with Differential  Result Value Ref Range   WBC 5.6 3.9 - 10.3 10e3/uL   NEUT# 2.5 1.5 - 6.5 10e3/uL   HGB 11.5 (L) 11.6 - 15.9 g/dL   HCT 34.1 (L) 34.8 - 46.6 %   Platelets 287 145 - 400 10e3/uL   MCV 91.8 79.5 - 101.0 fL   MCH 31.0 25.1 - 34.0 pg   MCHC 33.8 31.5 - 36.0 g/dL   RBC 3.72 3.70 - 5.45 10e6/uL   RDW 14.4 11.2 - 14.5 %   lymph# 2.3 0.9 - 3.3 10e3/uL   MONO# 0.5 0.1 - 0.9 10e3/uL   Eosinophils Absolute 0.2 0.0 - 0.5 10e3/uL   Basophils Absolute 0.0 0.0 - 0.1 10e3/uL   NEUT% 45.6 38.4 - 76.8 %   LYMPH% 40.3 14.0 - 49.7 %   MONO% 9.5 0.0 - 14.0 %   EOS% 3.9 0.0 - 7.0 %   BASO% 0.7 0.0 - 2.0 %  Comprehensive metabolic panel  Result Value Ref Range   Sodium 139 136 - 145 mEq/L   Potassium 3.9 3.5 - 5.1 mEq/L   Chloride 104 98 - 109 mEq/L   CO2 25 22 - 29 mEq/L   Glucose 85 70 - 140 mg/dl   BUN 21.3 7.0 - 26.0 mg/dL   Creatinine 1.4 (H) 0.6 - 1.1 mg/dL   Total Bilirubin 0.44 0.20 - 1.20 mg/dL   Alkaline Phosphatase 56 40 - 150 U/L   AST 17 5 - 34 U/L   ALT 20 0 - 55 U/L   Total Protein 7.4 6.4 - 8.3 g/dL   Albumin 4.2 3.5 - 5.0 g/dL   Calcium 9.6 8.4 - 10.4 mg/dL   Anion Gap 10 3 - 11 mEq/L   EGFR 51 (L) >90 ml/min/1.73 m2  CA 125  Result Value Ref Range   CA 125 4 <35 U/mL   We will let her know CA 125 result   UA available after visit negative for blood and leukocyte esterase, cx  pending  Studies/Results:  CT ABDOMEN AND PELVIS WITH CONTRAST  09-14-14  TECHNIQUE: Multidetector CT imaging of the abdomen and pelvis was performed using the standard protocol following bolus administration of intravenous contrast.  CONTRAST: 131m OMNIPAQUE IOHEXOL 300 MG/ML SOLN  COMPARISON: CT abdomen 03/16/2014  FINDINGS: Lower chest: Lung bases are clear.  Hepatobiliary: No focal hepatic lesion. No biliary duct dilatation. Gallbladder is normal. Common bile duct is normal.  Pancreas: Pancreas is normal. No ductal dilatation. No pancreatic inflammation.  Spleen: Normal spleen  Adrenals/urinary tract: Adrenal glands are normal. Kidneys, ureters, and bladder normal.  Stomach/Bowel: Stomach, small-bowel, appendix, cecum normal. There is a moderate volume stool throughout the colon. There is a the stricturing of the colon at the junction of the sigmoid colon and rectum. This narrowing occurs over a 3.5 cm segment (image 63, series 2).  Vascular/Lymphatic: Abdominal  aorta is normal caliber. There is no retroperitoneal or periportal lymphadenopathy. No pelvic lymphadenopathy.  Reproductive: Post hysterectomy anatomy. Section of the large bilateral ovarian cystic masses.  Musculoskeletal: No aggressive osseous lesion.  Other: No free-fluid. There are 3 nodules associated with the serosal surface of the transverse colon. This nodule measures 8 mm on image 31 series 2, 10 mm on image 31 and 30 11 mm image 32.  IMPRESSION: 1. Segment of circumferential bowel wall narrowing at the junction of the sigmoid colon and rectum is concerning for a serosal metastasis with subsequent constriction of the bowel lumen. There is a moderate volume of stool throughout the colon suggesting some degree of early obstruction. There is stool in the rectum. 2. Status post hysterectomy and resection of the cystic ovarian masses. 3. Three nodules adherent to the transverse  colon are concerning for serosal metastasis. 4. Subcutaneous thickening at the umbilicus likely related to Surgery.   NUCLEAR MEDICINE PET SKULL BASE TO THIGH  09-27-14  COMPARISON: CT abdomen pelvis 09/14/2014.  FINDINGS: NECK  No hypermetabolic lymph nodes in the neck. CT images show no acute findings.  CHEST  No hypermetabolic mediastinal, hilar or axillary lymph nodes. No hypermetabolic pulmonary nodules. CT images show no acute findings. A right PICC the SVC. No pericardial or pleural effusion.  ABDOMEN/PELVIS  No abnormal hypermetabolism in the liver, adrenal glands, spleen or pancreas. No hypermetabolic lymph nodes. Suspected peritoneal implants along the distal transverse colon are seen on CT images 94, 99, 100, 103 and 104. No definite associated abnormal hypermetabolism although the lesions are below or at the size threshold for PET resolution. The suspected area circumferential thickening and narrowing at the rectosigmoid colon junction does not show hypermetabolism above normal colon. CT images show no acute findings from 09/14/2014.  SKELETON  No abnormal osseous hypermetabolism.  IMPRESSION: 1. No abnormal hypermetabolism above normal colon at the rectosigmoid junction, at the site of suspected mass in narrowing on 09/14/2014. 2. Small soft tissue nodules along the distal transverse colon do not show abnormal hypermetabolism but may be too small for PET resolution.     Medications: I have reviewed the patient's current medications.  DISCUSSION: CBC improved and clinically recovering from chemotherapy. Needs to resume regular exercise, which she plans to do with home treadmill.  Will repeat mammogram in a year, May 2016. She will be seen by gyn oncology and medical oncology in 3 mo and 6 mo, with labs including CA 125 at those visits.  Assessment/Plan:  1. High grade clear cell carcinoma of bilateral ovaries with microscopic involvement  of one right pelvic node at complete debulking surgery by Dr Alycia Rossetti at [facility] 03-31-2014, FIGO IIIA1 (i). Dense adhesions and retroperitoneal fibrosis at surgery. Carboplatin and taxol x 6 cycles 05-12-14 thru 09-01-14, with neulasta support. Genetics testing with VUS 04-2014. CT AP8-2016 with areas of concern rectosigmoid and transverse colon, with PET 08-2014 and colonoscopy no malignancy identified. She is to see gyn oncology in March with lab and I will see her June with lab [facility] needed for chemo, subsequently removed 3. 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 Colonoscopy with some polyps ~ 2 yrs ago, report requested. 8.environmental allergies, zyrtec 9.Advance Directive information given 10.Creatinine more elevated with hyzaar and with dehydration previously.Encouraged her to push fluids. Cc Dr Quillian Quince who manages antihypertensives 11. Chemo anemia and chemo  thrombocytopenia resolved.   ADDENDUM: colonoscopy by Dr Burke Keels at [facility] 10-06-14. Copy of report to be requested for this EMR  All questions answered and patient is in agreement with recommendations and plans. TIme spent 25 min including >50% counseling and coordination of care   Haydyn Liddell P, MD   01/10/2015, 1:23 PM

## 2015-01-09 LAB — CA 125: CA 125: 4 U/mL (ref <35)

## 2015-01-09 LAB — URINE CULTURE

## 2015-01-10 ENCOUNTER — Telehealth: Payer: Self-pay

## 2015-01-10 NOTE — Telephone Encounter (Signed)
Audrey Porter had her colonoscopy on 10-06-14 as an outpatient at [facility] by Dr. Burke Keels with [place] Office: [phone].

## 2015-01-10 NOTE — Telephone Encounter (Signed)
Told Ms. Lantis the results fo the urine culture and Ca 125 as noted below by Dr. Marko Plume.

## 2015-01-10 NOTE — Telephone Encounter (Signed)
-----   Message from Gordy Levan, MD sent at 01/09/2015 11:03 PM EST ----- Labs seen and need follow up: please let her know urine culture was negative and CA 125 in good low range at 4. Please ask which MD did colonoscopy ~ 09-28-14 and where that was done, as I do not find the report in this EMR and would like HIM to request this.   thanks

## 2015-04-03 ENCOUNTER — Other Ambulatory Visit: Payer: Self-pay

## 2015-04-03 DIAGNOSIS — C563 Malignant neoplasm of bilateral ovaries: Secondary | ICD-10-CM

## 2015-04-03 DIAGNOSIS — C561 Malignant neoplasm of right ovary: Secondary | ICD-10-CM

## 2015-04-03 DIAGNOSIS — C562 Malignant neoplasm of left ovary: Principal | ICD-10-CM

## 2015-04-04 ENCOUNTER — Ambulatory Visit: Payer: Managed Care, Other (non HMO) | Attending: Gynecologic Oncology | Admitting: Gynecologic Oncology

## 2015-04-04 ENCOUNTER — Encounter: Payer: Self-pay | Admitting: Gynecologic Oncology

## 2015-04-04 ENCOUNTER — Ambulatory Visit (HOSPITAL_BASED_OUTPATIENT_CLINIC_OR_DEPARTMENT_OTHER): Payer: Managed Care, Other (non HMO)

## 2015-04-04 VITALS — BP 125/66 | HR 74 | Temp 98.3°F | Resp 18 | Ht 64.0 in | Wt 213.1 lb

## 2015-04-04 DIAGNOSIS — I10 Essential (primary) hypertension: Secondary | ICD-10-CM | POA: Insufficient documentation

## 2015-04-04 DIAGNOSIS — C562 Malignant neoplasm of left ovary: Secondary | ICD-10-CM | POA: Diagnosis present

## 2015-04-04 DIAGNOSIS — C561 Malignant neoplasm of right ovary: Secondary | ICD-10-CM | POA: Diagnosis present

## 2015-04-04 DIAGNOSIS — Z90710 Acquired absence of both cervix and uterus: Secondary | ICD-10-CM | POA: Insufficient documentation

## 2015-04-04 DIAGNOSIS — Z9221 Personal history of antineoplastic chemotherapy: Secondary | ICD-10-CM | POA: Diagnosis not present

## 2015-04-04 DIAGNOSIS — K219 Gastro-esophageal reflux disease without esophagitis: Secondary | ICD-10-CM | POA: Insufficient documentation

## 2015-04-04 DIAGNOSIS — C563 Malignant neoplasm of bilateral ovaries: Secondary | ICD-10-CM

## 2015-04-04 DIAGNOSIS — Z9049 Acquired absence of other specified parts of digestive tract: Secondary | ICD-10-CM | POA: Insufficient documentation

## 2015-04-04 DIAGNOSIS — N210 Calculus in bladder: Secondary | ICD-10-CM | POA: Insufficient documentation

## 2015-04-04 LAB — COMPREHENSIVE METABOLIC PANEL
ALBUMIN: 4 g/dL (ref 3.5–5.0)
ALK PHOS: 56 U/L (ref 40–150)
ALT: 19 U/L (ref 0–55)
AST: 15 U/L (ref 5–34)
Anion Gap: 11 mEq/L (ref 3–11)
BILIRUBIN TOTAL: 0.52 mg/dL (ref 0.20–1.20)
BUN: 20.5 mg/dL (ref 7.0–26.0)
CALCIUM: 9.7 mg/dL (ref 8.4–10.4)
CO2: 23 mEq/L (ref 22–29)
Chloride: 106 mEq/L (ref 98–109)
Creatinine: 1.5 mg/dL — ABNORMAL HIGH (ref 0.6–1.1)
EGFR: 43 mL/min/{1.73_m2} — ABNORMAL LOW (ref >90)
GLUCOSE: 92 mg/dL (ref 70–140)
Potassium: 4.1 mEq/L (ref 3.5–5.1)
Sodium: 140 mEq/L (ref 136–145)
TOTAL PROTEIN: 7.4 g/dL (ref 6.4–8.3)

## 2015-04-04 LAB — CBC WITH DIFFERENTIAL/PLATELET
BASO%: 0.2 % (ref 0.0–2.0)
BASOS ABS: 0 10*3/uL (ref 0.0–0.1)
EOS%: 3.5 % (ref 0.0–7.0)
Eosinophils Absolute: 0.2 10*3/uL (ref 0.0–0.5)
HEMATOCRIT: 33.3 % — AB (ref 34.8–46.6)
HEMOGLOBIN: 11.5 g/dL — AB (ref 11.6–15.9)
LYMPH#: 2.1 10*3/uL (ref 0.9–3.3)
LYMPH%: 45.2 % (ref 14.0–49.7)
MCH: 30.7 pg (ref 25.1–34.0)
MCHC: 34.5 g/dL (ref 31.5–36.0)
MCV: 88.8 fL (ref 79.5–101.0)
MONO#: 0.3 10*3/uL (ref 0.1–0.9)
MONO%: 7.4 % (ref 0.0–14.0)
NEUT%: 43.7 % (ref 38.4–76.8)
NEUTROS ABS: 2 10*3/uL (ref 1.5–6.5)
Platelets: 251 10*3/uL (ref 145–400)
RBC: 3.75 10*6/uL (ref 3.70–5.45)
RDW: 13.7 % (ref 11.2–14.5)
WBC: 4.6 10*3/uL (ref 3.9–10.3)

## 2015-04-04 NOTE — Patient Instructions (Signed)
Follow-up with Dr. Marko Plume in 3 months and return to see Dr. Alycia Rossetti in 6 months. We will notify you of the results of your bloodwork from today.

## 2015-04-04 NOTE — Progress Notes (Signed)
Followup Note: Gyn-Onc  Audrey Porter 55 y.o. female  CC:  Chief Complaint  Patient presents with  . Ovarian Cancer    Follow up    Assessment and Plan: Audrey Porter is a 55 y.o. 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 There was concern that she had a persistent disease however her PET scan was negative, she had normal CA-125 and subsequent her colonoscopy was unremarkable. We will follow-up in results for CA-125 from today. She'll follow-up with Dr. Marko Porter in 2-3 months and return to see me to 3 months after that appointment. I will notify her of the results of her CA-125. Her abdominal pain is fairly nondescript. I would not necessarily repeat imaging at this point if her CA-125 stays in the normal range. Should it rise, I will proceed with imaging. Of note at the time of diagnosis her CA-125 was 56.1.  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 She was given a copy of her results from [facility] and she will take them to her daughter states that she can review that with her physicians.   HPI: Patient is seen at the request of Dr. Evie Porter. Her primary care physician is Dr. Gar Porter.  Patient is a 55 year old gravida 1 para 1 who thought that she was gaining weight for the past few months. She states that in January she had a urinary tract infection or so she thought. She started feeling some increase pressure and immediately after voiding felt like she still needed to void again and was having only small amounts of urine. She was seen by her primary care physician's office and she was diagnosed with a urinary tract infection and provided antibiotics; however, she was contacted that the urine culture was negative and her symptoms had not abated. She ultimately sought care for this. In retrospect she thinks that this is been developing an ongoing for several months.   She underwent a CT scan on 03/16/2014. It revealed the lung bases were clear. The liver enhances  with no focal abnormality. There was perihepatic ascites present. The gallbladder was surgically absent. The pancreas, adrenal glands, spleen, stomach, kidneys were unremarkable. There was no evidence of any peritoneal nodularity or carcinomatosis. There is no lymphadenopathy. Within the pelvis there was a 9.9 x 20.1 x 17.8 large lobular mass which was septated and primarily cystic. However, the mass does contain some soft tissue component. The uterus could not be visualized. The urinary bladder was compressed by the large septated lobular mass.   Transvaginal ultrasound was performed on February 22. The uterus was 8.8 x 2.8 x 4.0 cm with no fibroids or other masses visualized. The endometrium was approximately 12 mm in thickness. The right ovary and left ovary were difficult to definitively leave visualize. Within the pelvis was a 20 x 19 x 9 cm solid and cystic mass which filled the pelvic pelvis. There were large solid components with internal blood flow. There was a 1.5 cm bladder stone that was incidentally noted. CA-125 was performed that was 56.1. CEA was 0.6. All other labs were remarkable only for a creatinine of 1.44 and hematocrit of 32.7.   Oncology Summary:    Ovarian cancer ([facility]CC)    02/2014  Initial Diagnosis  Presented with weight gain, pelvic pressure, urinary frequency. CT: 20cm septate, cystic mass, ascites. CA-125 56.1.    03/31/2014  Surgery  TAH/BSO, BPLND, RPALND, omentectomy, ureterolysis. Findings: Bilat complex ovarian masses, dense adhesions, intraop spill of R  cyst contents. R0 resection. Path: clear cell CA involving both ovaries, 1 R pelvic LN shows microscopic (60m) met.     Pathology: Diagnosis: FSA, A: Ovary and fallopian tube, left, left salpingo-oophorectomy  Histologic type: Clear cell carcinoma (see comment)   Histologic grade: high grade/grade 3   Tumor site: (ovary/primary peritoneal) ovary   Tumor size: (greatest dimension) 13 x 9.5 x 6.2 cm   Ovarian  surface involvement: not identified   Status of capsule: (intact/ruptured) intact   Extent of involvement of other tissues/organs (select all that apply):   Right ovary  Left ovary  Lymphovascular space invasion: present   Regional lymph nodes (see other specimens):  Total number involved: 1  Total number examined: 10 Size of largest metastasis: microscopic focus only, approximately 1 mm;  necrosis, fibrosis, and foreign body type giant cell reaction present within two additional lymph nodes, changes suggestive of treatment effect or regression   Additional pathologic findings: none   AJCC Pathologic Stage: pT3a1(i) pN1 pMx FIGO (2014classification) Stage Grouping: IIIA1(i)  Note: This pathologic stage assessment is based on information available at the time of this report, and is subject to change pending clinical review and additional information.  B: Ovary and fallopian tube, right, right salpingo-oophorectomy  Histologic type: clear cell carcinoma (B8)  Histologic grade: high grade/grade 3   Tumor site: (ovary/primary peritoneal) ovary   Tumor size: (greatest dimension) 11 x 9 x 4.5 cm   Ovarian surface involvement: not identified   Status of capsule: (intact/ruptured) ruptured   C: Uterus and cervix, hysterectomy  Cervix  - No tumor seen   Endometrium  - Atrophic endometrium   Myometrium  - Adenomyosis and subserosal endometriosis   D: Omentum, omentectomy - No tumor seen   E: Lymph nodes, right periaortic, regional dissection - Three lymph nodes, no tumor seen (0/3)   F: Lymph nodes, right pelvic, regional dissection - 1 out of 6 lymph nodes shows minute (approximately 2 mm) focus of metastatic adenocarcinoma (1/6) - Two additional lymph nodes show necrosis, fibrosis, and foreign body type giant cell reaction suggestive of treatment effect versus regression   G: Lymph node, left pelvic, regional dissection - One lymph node, no tumor seen (0/1)    COMMENT: Multiple immunostains are performed on block A12 to further evaluate the case for metastasis to the bilateral ovaries form a distant site and to evaluate for mucinous versus clear cell change. The tumor shows focal intracytoplasmic mucin on mucin stains, and immunostains shows the tumor to be cytokeratin 7 positive, cytokeratin 20 negative, CDX2 negative, PAX8 positive, and negative for chromogranin, synaptophysin, TTF1, p53, ER, and PR, and rare positive cells on CA125, with low p53 (approximately 5%). Napsin is positive. RCC and CD10 are negative. PAS is positive and the positive staining does not digest with PASD. These findings supporting a tumor of primary gynecologic origin. Overall, a clear cell carcinoma is favored. Seromucinous features cannot be excluded based on the presence of scattered, faint intracytoplasmic mucin noted on mucin stain and the morphology with focal signet ring cells. Although uncommon, this has been described, however, in clear cell carcinomas, therefore the above classification is favored.     She was dispositioned to receive adjuvant carboplatin and paclitaxel x 6 cycles which she completed on 09/01/14. Therapy was complicated by neutropenia requiring GCSF support and anemia requiring transfusions.   CA 125 normalized to 5U/mL on 09/14/14 after completion of therapy.  Post treatment CT  imaging performed on 09/14/14 revealed "stricturing of the  colon at the junction of the sigmoid colon and rectum. This narrowing occurs over a 3.5cm segment". Three nodules were adherent to the transverse colon and were concerning for serosal metastasis.   PET 09/27/14: IMPRESSION: 1. No abnormal hypermetabolism above normal colon at the rectosigmoid junction, at the site of suspected mass in narrowing on 09/14/2014. 2. Small soft tissue nodules along the distal transverse colon do not show abnormal hypermetabolism but may be too small for PET resolution.  She was  seen by gastroenterology (Dr. Anthony Sar) and had a negative colonoscopy October 06, 2014. Per report there were no polyps. It appears that there was some congestion and edema of the bowel wall but no mucosal irregularities. Of note at the time when there was a concern for recurrence her CA-125 was normal at 5. She comes in today for follow-up.   Interval History: She last saw Dr. Marko Porter in December at which time her CA 125 was 5. I saw her in September and her CA125 was 4. She continues to steadily gained weight. She's gained 11 pounds since we saw her in September. She is complaining of some knee pain. She also is complaining of some pain in her bilateral breasts. She does admit to drinking more coffee. She's due for a mammogram in May. The pain is fleeting and only last for a few seconds. She does admit that her bra size has increased that she's gained weight. Since approximately March of last year, she's gained almost 20 pounds. She does have a fit. One day she did walk 18,000 steps per on average she walks about 5000 steps a day. She is complaining some abdominal pain. She states it feels a burning sensation around the level of the umbilicus. It is been going on for about a month. She states it has woken up a few times but not regularly. The pain lasts for about 5 minutes. She has regular bowel movements with marrow laxatives and stool softener there's been no change with that. Her bowels seem to affect the abdominal pain. Her review of systems is otherwise negative. Specifically she denies any early satiety, abdominal bloating.  Review of Systems  Constitutional:Denies fever. No nausea or vomiting. No early satiety. Skin: No rash Cardiovascular: No chest pain, shortness of breath, or edema  Pulmonary: No cough  Gastro Intestinal: No nausea, vomiting, constipation, or diarrhea reported. No change in bowel movement.  Genitourinary:  Denies vaginal bleeding and discharge.  Musculoskeletal: + knee pain.   Neurologic: No weakness Psychology: No changes  Current Meds:  Outpatient Encounter Prescriptions as of 04/04/2015  Medication Sig  . cetirizine (ZYRTEC) 5 MG tablet Take 5 mg by mouth daily.  Marland Kitchen docusate sodium (COLACE) 100 MG capsule Take by mouth. Pt takes 2 in am and 1 in pm  . fluticasone (FLONASE) 50 MCG/ACT nasal spray Place 1 spray into both nostrils daily.   Marland Kitchen ibuprofen (ADVIL,MOTRIN) 200 MG tablet Take 200-400 mg by mouth every 8 (eight) hours as needed.  Marland Kitchen losartan-hydrochlorothiazide (HYZAAR) 100-25 MG per tablet Take 1 tablet by mouth daily. Pt takes this medication needed depending on her BP checks.  . Multiple Vitamins-Minerals (CENTRUM SILVER ULTRA WOMENS PO) Take 1 tablet by mouth daily.  Marland Kitchen omeprazole (PRILOSEC) 20 MG capsule Take 20 mg by mouth at bedtime.  . polyethylene glycol (MIRALAX / GLYCOLAX) packet Take 17 g by mouth daily as needed.  . Powders (GOLD BOND NO MESS BODY POWDER EX) Apply topically. Under breasts daily  . ferrous fumarate (HEMOCYTE -  106 MG FE) 325 (106 FE) MG TABS tablet Take 1 tablet po daily on an empty stomach with OJ. (Patient not taking: Reported on 04/04/2015)   No facility-administered encounter medications on file as of 04/04/2015.    Allergy:  Allergies  Allergen Reactions  . Sulfa Antibiotics Hives    Social Hx:  She has a daughter who is 28. 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 Marital Status: Married    Spouse Name: N/A  . Number of Children: N/A  . Years of Education: N/A   Occupational History  . Not on file.   Social History Main Topics  . Smoking status: Never Smoker   . Smokeless tobacco: Not on file  . Alcohol Use: No  . Drug Use: No  . Sexual Activity: No   Other Topics Concern  . Not on file   Social History Narrative    Past Surgical Hx:  Past Surgical History  Procedure Laterality Date  . Abdominal hysterectomy  03/31/14    at [facility]  . Cholecystectomy  2000  .  Cesarean section  1990    Past Medical Hx:  Past Medical History  Diagnosis Date  . Hypertension   . 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 Cancer Brother 61    lung smoker  . Cancer Paternal Aunt 91    breast  . Cancer Maternal Grandmother 25    leukemia  . Cancer Paternal Grandmother     colorectal at unknown age   her brother died this past summer of lung cancer. He was a heavy smoker  Vitals:  Blood pressure 125/66, pulse 74, temperature 98.3 F (36.8 C), temperature source Oral, resp. rate 18, height '5\' 4"'  (1.626 m), weight 213 lb 1.6 oz (96.662 kg), SpO2 100 %.  Physical Exam: Well-nourished well-developed female in no acute distress.  Neck: Supple, no lymphadenopathy, no thyromegaly.  Lungs: Clear to auscultation bilaterally.  Cardiovascular: Regular rate and rhythm.  Abdomen: Vertical midline incision is well-healed. There is no appreciable hernia. Abdomen is soft, nontender, nondistended. There are no palpable masses. There is no fluid wave.  Groins: No lymphadenopathy.  Extremities: No edema.  Pelvic: Normal female genitalia. Bimanual exam reveals no masses or nodularity. Rectal confirms  Gavyn Ybarra A., MD 04/04/2015, 10:27 AM

## 2015-04-05 LAB — CA 125: CANCER ANTIGEN (CA) 125: 6.9 U/mL (ref 0.0–38.1)

## 2015-04-05 LAB — CANCER ANTIGEN 125 (PARALLEL TESTING): CA 125: 5 U/mL (ref <35)

## 2015-04-06 ENCOUNTER — Telehealth: Payer: Self-pay

## 2015-04-06 NOTE — Telephone Encounter (Signed)
Spoke with Ms. Hamidi and told her the results of her labs from 04-02-15 as noted below.   Ms. Kampf verbalized understanding.

## 2015-04-06 NOTE — Telephone Encounter (Signed)
-----   Message from Gordy Levan, MD sent at 04/06/2015  7:49 AM EST ----- Labs seen and need follow up: please let her know CA 125 stable in low range. Creatinine is a little higher - needs to be sure to stay very well hydrated; blood counts stable. I have sent the chemistries to Dr Quillian Quince

## 2015-04-13 ENCOUNTER — Other Ambulatory Visit: Payer: Self-pay

## 2015-04-13 DIAGNOSIS — Z1231 Encounter for screening mammogram for malignant neoplasm of breast: Secondary | ICD-10-CM

## 2015-06-06 ENCOUNTER — Ambulatory Visit
Admission: RE | Admit: 2015-06-06 | Discharge: 2015-06-06 | Disposition: A | Discharge status: Home / Self Care | Payer: Managed Care, Other (non HMO) | Source: Ambulatory Visit

## 2015-06-06 DIAGNOSIS — Z1231 Encounter for screening mammogram for malignant neoplasm of breast: Secondary | ICD-10-CM

## 2015-07-08 ENCOUNTER — Other Ambulatory Visit: Payer: Self-pay | Admitting: Oncology

## 2015-07-12 ENCOUNTER — Encounter: Payer: Self-pay | Admitting: Oncology

## 2015-07-12 ENCOUNTER — Other Ambulatory Visit (HOSPITAL_BASED_OUTPATIENT_CLINIC_OR_DEPARTMENT_OTHER): Payer: Managed Care, Other (non HMO)

## 2015-07-12 ENCOUNTER — Ambulatory Visit (HOSPITAL_BASED_OUTPATIENT_CLINIC_OR_DEPARTMENT_OTHER): Payer: Managed Care, Other (non HMO) | Admitting: Oncology

## 2015-07-12 ENCOUNTER — Telehealth: Payer: Self-pay | Admitting: Oncology

## 2015-07-12 VITALS — BP 126/61 | HR 78 | Temp 98.5°F | Resp 18 | Ht 64.0 in | Wt 212.8 lb

## 2015-07-12 DIAGNOSIS — C562 Malignant neoplasm of left ovary: Secondary | ICD-10-CM | POA: Diagnosis not present

## 2015-07-12 DIAGNOSIS — C563 Malignant neoplasm of bilateral ovaries: Secondary | ICD-10-CM

## 2015-07-12 DIAGNOSIS — G620 Drug-induced polyneuropathy: Secondary | ICD-10-CM

## 2015-07-12 DIAGNOSIS — C561 Malignant neoplasm of right ovary: Secondary | ICD-10-CM

## 2015-07-12 DIAGNOSIS — T451X5A Adverse effect of antineoplastic and immunosuppressive drugs, initial encounter: Secondary | ICD-10-CM

## 2015-07-12 LAB — CBC WITH DIFFERENTIAL/PLATELET
BASO%: 1.9 % (ref 0.0–2.0)
Basophils Absolute: 0.1 10*3/uL (ref 0.0–0.1)
EOS%: 2.1 % (ref 0.0–7.0)
Eosinophils Absolute: 0.1 10*3/uL (ref 0.0–0.5)
HCT: 31.9 % — ABNORMAL LOW (ref 34.8–46.6)
HGB: 11.1 g/dL — ABNORMAL LOW (ref 11.6–15.9)
LYMPH%: 43.1 % (ref 14.0–49.7)
MCH: 29.5 pg (ref 25.1–34.0)
MCHC: 34.8 g/dL (ref 31.5–36.0)
MCV: 84.8 fL (ref 79.5–101.0)
MONO#: 0.4 10*3/uL (ref 0.1–0.9)
MONO%: 8.8 % (ref 0.0–14.0)
NEUT%: 44.1 % (ref 38.4–76.8)
NEUTROS ABS: 1.9 10*3/uL (ref 1.5–6.5)
Platelets: 304 10*3/uL (ref 145–400)
RBC: 3.76 10*6/uL (ref 3.70–5.45)
RDW: 13.5 % (ref 11.2–14.5)
WBC: 4.3 10*3/uL (ref 3.9–10.3)
lymph#: 1.9 10*3/uL (ref 0.9–3.3)
nRBC: 0 % (ref 0–0)

## 2015-07-12 LAB — COMPREHENSIVE METABOLIC PANEL
ALT: 18 U/L (ref 0–55)
AST: 14 U/L (ref 5–34)
Albumin: 4.1 g/dL (ref 3.5–5.0)
Alkaline Phosphatase: 52 U/L (ref 40–150)
Anion Gap: 10 mEq/L (ref 3–11)
BUN: 24.3 mg/dL (ref 7.0–26.0)
CALCIUM: 9.8 mg/dL (ref 8.4–10.4)
CHLORIDE: 102 meq/L (ref 98–109)
CO2: 27 meq/L (ref 22–29)
CREATININE: 1.7 mg/dL — AB (ref 0.6–1.1)
EGFR: 38 mL/min/{1.73_m2} — ABNORMAL LOW (ref >90)
GLUCOSE: 107 mg/dL (ref 70–140)
Potassium: 3.5 mEq/L (ref 3.5–5.1)
SODIUM: 138 meq/L (ref 136–145)
Total Bilirubin: 0.56 mg/dL (ref 0.20–1.20)
Total Protein: 7.7 g/dL (ref 6.4–8.3)

## 2015-07-12 NOTE — Progress Notes (Signed)
OFFICE PROGRESS NOTE   July 13, 2015   Physicians: Nancy Marus, Caryl Bis, MD (PCP), Alden Hipp), Gari [facility])  INTERVAL HISTORY:   Patient is seen, alone for visit, in continuing follow up of IIIA1 clear cell carcinoma of bilateral ovaries, on observation since completing 6 cycles of adjuvant carboplatin taxol on 09-01-14. Last imaging was PET 08-2014. She saw Dr Alycia Rossetti 04-04-15 and will see her again in 3 months from this visit.  She saw Dr Quillian Quince recently, next visit there in Sept. No changes in antihypertensives.   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 She has intermittent brief abdominal discomfort LUQ moreso than RUQ, no clear precipitating factors, happens generally daily, seems possibly gas related. Bowels do not always move daily even with miralax daily, which she could increase to bid if needed. Note colonoscopy by Dr Anthony Sar [phone] reportedly not remarkable. She does not have N/V, appetite good, no abdominal distension. Bladder ok. No LE swelling. No SOB, no recent infectious illness. She is tired after working all day, but very busy at work.  Remainder of 10 point Review of Systems negative.  CA 125 preoperatively 56, this 5 on 09-14-14. 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  Daughter is 4th year med Ship broker.  ONCOLOGIC HISTORY  03/23/2014 Initial Diagnosis Pelvic mass   03/31/2014 Surgery IIIA1(i) clear cell carcinoma of the ovary   - 09/01/2014 Chemotherapy Cycle #6 paclitaxel and carboplatin  Patient had been in usual good health, up to date on all medical care, when she developed bladder symptoms in early 2016. Urine culture showed no infection, then CT AP at [facility] 03-16-14 found perihepatic ascites and pelvic mass 9.9 x 20.1 x 17.8 cm which was cystic and solid, kidneys and ureters unremarkable, no pelvic fluid,  rectosigmoid partially compressed, other organs not remarkable, no mention of adenopathy (report scanned into this EMR). Past gyn care has been by Dr Alden Hipp, however she was seen promptly by Dr Evie Lacks on referral from Dr Quillian Quince after CT resulted; CT findings confirmed by transvaginal US. CA 125 was 56 pre op. She saw Dr Alycia Rossetti in consultation on 03-23-14, then had surgery at [facility] on 03-31-14 which was TAH/BSO, omentectomy, pelvic and paraaortic node evaluation. Surgical findings were of bilateral complex ovarian masses and dense adhesions, with intra-operative spill of right ovarian mass as that was densely adherent to sigmoid serosa; posterior uterine segment also adherent to rectosigmoid, and retroperitoneal fibrosis bilaterally. Pathology [facility] [phone]) found grade 3 clear cell carcinoma involving both ovaries, largest 13 x 9.5 x 6.2 cm, + LVSI and one right pelvic node with microscopic involvement approximately 1 mm, ER PR negative, pT3a1 pN1 pMx. Cytology from ascites [facility] [phone]) was positive for adenocarcinoma of gyn primary. Multidisciplinary conference at [facility] 04-19-14 recommended carboplatin and taxane chemotherapy. Cycle 1 carbo taxol was given on 05-12-14, with neutropenia by day 11 (ANC 0.8) despite having started granix on day 8.She was hospitalized at [facility] after cycle 3 with syncope related to dehydration. She completed cycle 6 on 09-01-14.      Objective:  Vital signs in last 24 hours: Weight stable 212, BMI 36  126/61, 78 regular, 18, 98.5, 100%  Alert, oriented and appropriate. Ambulatory without difficulty. Looks comfortable, very pleasant as always   HEENT:PERRL, sclerae not icteric. Oral mucosa moist without lesions, posterior pharynx clear.  Neck supple. No JVD.  Lymphatics:no cervical,supraclavicular, axillary or inguinal adenopathy  Resp: clear to auscultation bilaterally and normal percussion bilaterally Cardio: regular rate and rhythm. No gallop. GI: abdomen soft,  nontender, not distended, no mass or organomegaly. Normally active bowel sounds. Surgical incision not remarkable. Musculoskeletal/ Extremities: without pitting edema, cords, tenderness Neuro: peripheral neuropathy feet as noted. Otherwise nonfocal. PSYCH appropriate mood and affect Skin without rash, ecchymosis, petechiae   Lab Results:  Results for orders placed or performed in visit on 07/12/15  CBC with Differential  Result Value Ref Range   WBC 4.3 3.9 - 10.3 10e3/uL   NEUT# 1.9 1.5 - 6.5 10e3/uL   HGB 11.1 (L) 11.6 - 15.9 g/dL   HCT 31.9 (L) 34.8 - 46.6 %   Platelets 304 145 - 400 10e3/uL   MCV 84.8 79.5 - 101.0 fL   MCH 29.5 25.1 - 34.0 pg   MCHC 34.8 31.5 - 36.0 g/dL   RBC 3.76 3.70 - 5.45 10e6/uL   RDW 13.5 11.2 - 14.5 %   lymph# 1.9 0.9 - 3.3 10e3/uL   MONO# 0.4 0.1 - 0.9 10e3/uL   Eosinophils Absolute 0.1 0.0 - 0.5 10e3/uL   Basophils Absolute 0.1 0.0 - 0.1 10e3/uL   NEUT% 44.1 38.4 - 76.8 %   LYMPH% 43.1 14.0 - 49.7 %   MONO% 8.8 0.0 - 14.0 %   EOS% 2.1 0.0 - 7.0 %   BASO% 1.9 0.0 - 2.0 %   nRBC 0 0 - 0 %  Comprehensive metabolic panel  Result Value Ref Range   Sodium 138 136 - 145 mEq/L   Potassium 3.5 3.5 - 5.1 mEq/L   Chloride 102 98 - 109 mEq/L   CO2 27 22 - 29 mEq/L   Glucose 107 70 - 140 mg/dl   BUN 24.3 7.0 - 26.0 mg/dL   Creatinine 1.7 (H) 0.6 - 1.1 mg/dL   Total Bilirubin 0.56 0.20 - 1.20 mg/dL   Alkaline Phosphatase 52 40 - 150 U/L   AST 14 5 - 34 U/L   ALT 18 0 - 55 U/L   Total Protein 7.7 6.4 - 8.3 g/dL   Albumin 4.1 3.5 - 5.0 g/dL   Calcium 9.8 8.4 - 10.4 mg/dL   Anion Gap 10 3 - 11 mEq/L   EGFR 38 (L) >90 ml/min/1.73 m2  CA 125  Result Value Ref Range   Cancer Antigen (CA) 125 7.5 0.0 - 38.1 U/mL  CA 125 (Parallel Testing)  Result Value Ref Range   CA 125 5 <35 U/mL    Studies/Results: [facility] DIGITAL SCREENING BILATERAL MAMMOGRAM WITH CAD  COMPARISON: Previous exam(s).  ACR Breast Density Category b: There are  scattered areas of fibroglandular density.  FINDINGS: There are no findings suspicious for malignancy. Images were processed with CAD.  IMPRESSION: No mammographic evidence of malignancy.   RECOMMENDATION: Screening mammogram in one year.   BI-RADS CATEGORY 1: Negative.  Medications: I have reviewed the patient's current medications. Increase miralax to bid prn  DISCUSSION Clinically doing well, tho slight abdominal discomfort intermittently noted, did have dense adhesions and retroperitoneal fibrosis at surgery. Will let her know results of CA 125 as above.  Follow up with Dr Alycia Rossetti in Sept with lab.   Assessment/Plan:  1. High grade clear cell carcinoma of bilateral ovaries with microscopic involvement of one right pelvic node at complete debulking surgery by Dr Alycia Rossetti at [facility] 03-31-2014, FIGO IIIA1 (i). Dense adhesions and retroperitoneal fibrosis at surgery. Carboplatin and taxol x 6 cycles 05-12-14 thru 09-01-14, with neulasta support. Genetics  testing with VUS 04-2014. CT AP8-2016 with areas of concern rectosigmoid and transverse colon, with PET 08-2014 and colonoscopy no malignancy identified. She is to see gyn oncology in Sept, lab with that visit. 2,PICC needed for chemo, subsequently removed 3. 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 Colonoscopy ~ 09-2014 by Dr Anthony Sar, that report not located this EMR and will have HIM follow up 8.environmental allergies, zyrtec 9.Advance Directive information given 10.Creatinine slightly elevated with hyzaar and with dehydration previously.Encouraged her to push fluids. Cc Dr Quillian Quince who manages antihypertensives   All questions answered and patient is in agreement with plans as noted. Time spent 25 min including >50% counseling and coordination of care. Route  Dr Quillian Quince, cc Dr Anthony Sar    Gordy Levan, MD   07/13/2015, 10:33 PM

## 2015-07-12 NOTE — Telephone Encounter (Signed)
appt made and avs printed °

## 2015-07-13 ENCOUNTER — Telehealth: Payer: Self-pay

## 2015-07-13 LAB — CA 125: Cancer Antigen (CA) 125: 7.5 U/mL (ref 0.0–38.1)

## 2015-07-13 LAB — CANCER ANTIGEN 125 (PARALLEL TESTING): CA 125: 5 U/mL (ref <35)

## 2015-07-13 NOTE — Telephone Encounter (Signed)
Told Ms Turlington the results of the CA-125 as noted below by Dr. Marko Plume.

## 2015-07-13 NOTE — Telephone Encounter (Signed)
-----   Message from Gordy Levan, MD sent at 07/13/2015  7:16 AM EDT ----- Labs seen and need follow up: please let her know ca 125 stable by new lab method and by the old lab method run in parallel for comparison. Keep apts as planned

## 2015-10-10 ENCOUNTER — Encounter: Payer: Self-pay | Admitting: Gynecologic Oncology

## 2015-10-10 ENCOUNTER — Other Ambulatory Visit: Payer: Self-pay | Admitting: Gynecologic Oncology

## 2015-10-10 ENCOUNTER — Other Ambulatory Visit (HOSPITAL_BASED_OUTPATIENT_CLINIC_OR_DEPARTMENT_OTHER): Payer: Managed Care, Other (non HMO)

## 2015-10-10 ENCOUNTER — Other Ambulatory Visit: Payer: Managed Care, Other (non HMO)

## 2015-10-10 ENCOUNTER — Ambulatory Visit: Payer: Managed Care, Other (non HMO) | Attending: Gynecologic Oncology | Admitting: Gynecologic Oncology

## 2015-10-10 VITALS — BP 103/54 | HR 81 | Temp 98.8°F | Resp 18 | Ht 64.0 in | Wt 214.7 lb

## 2015-10-10 DIAGNOSIS — C562 Malignant neoplasm of left ovary: Secondary | ICD-10-CM

## 2015-10-10 DIAGNOSIS — Z888 Allergy status to other drugs, medicaments and biological substances status: Secondary | ICD-10-CM | POA: Insufficient documentation

## 2015-10-10 DIAGNOSIS — Z9889 Other specified postprocedural states: Secondary | ICD-10-CM | POA: Diagnosis not present

## 2015-10-10 DIAGNOSIS — Z79899 Other long term (current) drug therapy: Secondary | ICD-10-CM | POA: Diagnosis not present

## 2015-10-10 DIAGNOSIS — Z9049 Acquired absence of other specified parts of digestive tract: Secondary | ICD-10-CM | POA: Diagnosis not present

## 2015-10-10 DIAGNOSIS — K5900 Constipation, unspecified: Secondary | ICD-10-CM | POA: Diagnosis not present

## 2015-10-10 DIAGNOSIS — Z8543 Personal history of malignant neoplasm of ovary: Secondary | ICD-10-CM

## 2015-10-10 DIAGNOSIS — I10 Essential (primary) hypertension: Secondary | ICD-10-CM | POA: Diagnosis not present

## 2015-10-10 DIAGNOSIS — C561 Malignant neoplasm of right ovary: Secondary | ICD-10-CM | POA: Diagnosis present

## 2015-10-10 DIAGNOSIS — K219 Gastro-esophageal reflux disease without esophagitis: Secondary | ICD-10-CM | POA: Diagnosis not present

## 2015-10-10 DIAGNOSIS — C569 Malignant neoplasm of unspecified ovary: Secondary | ICD-10-CM

## 2015-10-10 NOTE — Patient Instructions (Signed)
We will contact you with the results of your lab test. Please call Audrey Porter after your visit with Dr Marko Plume on December 18 , 2017 to schedule a follow up appointment with Dr Alycia Rossetti for March 2018. Please call with any changes, questions or concerns.  Thank you

## 2015-10-10 NOTE — Progress Notes (Signed)
Followup Note: Gyn-Onc  [name] 55 y.o. female  CC:  No chief complaint on file.   Assessment and Plan: LAKEISHIA TRULUCK is a 55 y.o. 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 There was concern that she had a persistent disease however her PET scan was negative, she had normal CA-125 and subsequent her colonoscopy was unremarkable. We will follow-up in results for CA-125 from today. Of note at the time of diagnosis her CA-125 was 56.1 She has an appointment to see Dr. Marko Plume on December 18. 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    HPI: Patient is seen at the request of Dr. Evie Lacks. Her primary care physician is Dr. Gar Ponto.  Patient is a 55 year old gravida 1 para 1 who thought that she was gaining weight for the past few months. She states that in January she had a urinary tract infection or so she thought. She started feeling some increase pressure and immediately after voiding felt like she still needed to void again and was having only small amounts of urine. She was seen by her primary care physician's office and she was diagnosed with a urinary tract infection and provided antibiotics; however, she was contacted that the urine culture was negative and her symptoms had not abated. She ultimately sought care for this. In retrospect she thinks that this is been developing an ongoing for several months.   She underwent a CT scan on 03/16/2014. It revealed the lung bases were clear. The liver enhances with no focal abnormality. There was perihepatic ascites present. The gallbladder was surgically absent. The pancreas, adrenal glands, spleen, stomach, kidneys were unremarkable. There was no evidence of any peritoneal nodularity or carcinomatosis. There is no lymphadenopathy. Within the pelvis there was a 9.9 x 20.1 x 17.8 large lobular mass which was septated and primarily cystic. However, the mass does contain some  soft tissue component. The uterus could not be visualized. The urinary bladder was compressed by the large septated lobular mass.   Transvaginal ultrasound was performed on February 22. The uterus was 8.8 x 2.8 x 4.0 cm with no fibroids or other masses visualized. The endometrium was approximately 12 mm in thickness. The right ovary and left ovary were difficult to definitively leave visualize. Within the pelvis was a 20 x 19 x 9 cm solid and cystic mass which filled the pelvic pelvis. There were large solid components with internal blood flow. There was a 1.5 cm bladder stone that was incidentally noted. CA-125 was performed that was 56.1. CEA was 0.6. All other labs were remarkable only for a creatinine of 1.44 and hematocrit of 32.7.   Oncology Summary:    Ovarian cancer ([facility])    02/2014  Initial Diagnosis  Presented with weight gain, pelvic pressure, urinary frequency. CT: 20cm septate, cystic mass, ascites. CA-125 56.1.    03/31/2014  Surgery  TAH/BSO, BPLND, RPALND, omentectomy, ureterolysis. Findings: Bilat complex ovarian masses, dense adhesions, intraop spill of R cyst contents. R0 resection. Path: clear cell CA involving both ovaries, 1 R pelvic LN shows microscopic (40m) met.     Pathology: Diagnosis: FSA, A: Ovary and fallopian tube, left, left salpingo-oophorectomy  Histologic type: Clear cell carcinoma (see comment)   Histologic grade: high grade/grade 3   Tumor site: (ovary/primary peritoneal) ovary   Tumor size: (greatest dimension) 13 x 9.5 x 6.2 cm   Ovarian surface involvement: not identified   Status  of capsule: (intact/ruptured) intact   Extent of involvement of other tissues/organs (select all that apply):   Right ovary  Left ovary  Lymphovascular space invasion: present   Regional lymph nodes (see other specimens):  Total number involved: 1  Total number examined: 10 Size of largest metastasis: microscopic focus only, approximately 1 mm;  necrosis,  fibrosis, and foreign body type giant cell reaction present within two additional lymph nodes, changes suggestive of treatment effect or regression   Additional pathologic findings: none   AJCC Pathologic Stage: pT3a1(i) pN1 pMx FIGO (2014classification) Stage Grouping: IIIA1(i)  Note: This pathologic stage assessment is based on information available at the time of this report, and is subject to change pending clinical review and additional information.  B: Ovary and fallopian tube, right, right salpingo-oophorectomy  Histologic type: clear cell carcinoma (B8)  Histologic grade: high grade/grade 3   Tumor site: (ovary/primary peritoneal) ovary   Tumor size: (greatest dimension) 11 x 9 x 4.5 cm   Ovarian surface involvement: not identified   Status of capsule: (intact/ruptured) ruptured   C: Uterus and cervix, hysterectomy  Cervix  - No tumor seen   Endometrium  - Atrophic endometrium   Myometrium  - Adenomyosis and subserosal endometriosis   D: Omentum, omentectomy - No tumor seen   E: Lymph nodes, right periaortic, regional dissection - Three lymph nodes, no tumor seen (0/3)   F: Lymph nodes, right pelvic, regional dissection - 1 out of 6 lymph nodes shows minute (approximately 2 mm) focus of metastatic adenocarcinoma (1/6) - Two additional lymph nodes show necrosis, fibrosis, and foreign body type giant cell reaction suggestive of treatment effect versus regression   G: Lymph node, left pelvic, regional dissection - One lymph node, no tumor seen (0/1)   COMMENT: Multiple immunostains are performed on block A12 to further evaluate the case for metastasis to the bilateral ovaries form a distant site and to evaluate for mucinous versus clear cell change. The tumor shows focal intracytoplasmic mucin on mucin stains, and immunostains shows the tumor to be cytokeratin 7 positive, cytokeratin 20 negative, CDX2 negative, PAX8 positive, and negative  for chromogranin, synaptophysin, TTF1, p53, ER, and PR, and rare positive cells on CA125, with low p53 (approximately 5%). Napsin is positive. RCC and CD10 are negative. PAS is positive and the positive staining does not digest with PASD. These findings supporting a tumor of primary gynecologic origin. Overall, a clear cell carcinoma is favored. Seromucinous features cannot be excluded based on the presence of scattered, faint intracytoplasmic mucin noted on mucin stain and the morphology with focal signet ring cells. Although uncommon, this has been described, however, in clear cell carcinomas, therefore the above classification is favored.     She was dispositioned to receive adjuvant carboplatin and paclitaxel x 6 cycles which she completed on 09/01/14. Therapy was complicated by neutropenia requiring GCSF support and anemia requiring transfusions.   CA 125 normalized to 5U/mL on 09/14/14 after completion of therapy.  Post treatment CT  imaging performed on 09/14/14 revealed "stricturing of the colon at the junction of the sigmoid colon and rectum. This narrowing occurs over a 3.5cm segment". Three nodules were adherent to the transverse colon and were concerning for serosal metastasis.   PET 09/27/14: IMPRESSION: 1. No abnormal hypermetabolism above normal colon at the rectosigmoid junction, at the site of suspected mass in narrowing on 09/14/2014. 2. Small soft tissue nodules along the distal transverse colon do not show abnormal hypermetabolism but may be too small for  PET resolution.  She was seen by gastroenterology (Dr. Anthony Sar) and had a negative colonoscopy October 06, 2014.   Interval History: I last saw her in March of this year. She was seen by Dr. Marko Plume in June. Her CA-125 in March was 6.9 and her CA-125 in June was 7.5. She had a screening mammogram in May 2017 that was unremarkable for recommendations for follow-up in one year. She comes in today for follow-up.  She's overall  doing fairly well. Her daughter is her last treatment medical school and is planning on going into psychiatry. She continues to have issues with constipation. She was to try some sincerely on hospice. With her Metamucil she'll have a bowel movement every day to twice a day and they're soft but if she does not take anything she will have significant constipation. Her weight is stable. She states she eats too much and she is a very good appetite. 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 She's not able to exercise or walking for long distances secondary to her neuropathy. They're remodeling their house. She states that once he finished with the remodeling she'll be able to get back on her exercise bike. She otherwise has no complaints.  Review of Systems  Constitutional:Denies fever. No nausea or vomiting. No early satiety. Skin: No rash Cardiovascular: No chest pain, shortness of breath, or edema  Pulmonary: No cough  Gastro Intestinal: No nausea, vomiting, constipation, or diarrhea reported. No change in bowel movement.  Genitourinary:  Denies vaginal bleeding and discharge.  Musculoskeletal: + knee pain.  Neurologic: + neuropathy in her toes Psychology: No changes  Current Meds:  Outpatient Encounter Prescriptions as of 10/10/2015  Medication Sig  . cetirizine (ZYRTEC) 5 MG tablet Take 5 mg by mouth daily.  Marland Kitchen docusate sodium (COLACE) 100 MG capsule Take by mouth. Pt takes 2 in am and 1 in pm  . ferrous fumarate (HEMOCYTE - 106 MG FE) 325 (106 FE) MG TABS tablet Take 1 tablet po daily on an empty stomach with OJ. (Patient not taking: Reported on 04/04/2015)  . fluticasone (FLONASE) 50 MCG/ACT nasal spray Place 1 spray into both nostrils daily.   Marland Kitchen ibuprofen (ADVIL,MOTRIN) 200 MG tablet Take 200-400 mg by mouth every 8 (eight) hours as needed.  Marland Kitchen losartan-hydrochlorothiazide (HYZAAR) 100-25 MG per tablet Take 1 tablet by mouth daily. Pt takes this medication needed  depending on her BP checks.  . Multiple Vitamins-Minerals (CENTRUM SILVER ULTRA WOMENS PO) Take 1 tablet by mouth daily.  Marland Kitchen omeprazole (PRILOSEC) 20 MG capsule Take 20 mg by mouth at bedtime.  . polyethylene glycol (MIRALAX / GLYCOLAX) packet Take 17 g by mouth daily as needed.  . Powders (GOLD BOND NO MESS BODY POWDER EX) Apply topically. Under breasts daily   No facility-administered encounter medications on file as of 10/10/2015.     Allergy:  Allergies  Allergen Reactions  . Sulfa Antibiotics Hives    Social Hx:  She has a daughter who is 70. 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 Marital status: Married    Spouse name: N/A  . Number of children: N/A  . Years of education: N/A   Occupational History  . Not on file.   Social History Main Topics  . Smoking status: Never Smoker  . Smokeless tobacco: Not on file  . Alcohol use No  . Drug use: No  . Sexual activity: No   Other  Topics Concern  . Not on file   Social History Narrative  . No narrative on file    Past Surgical Hx:  Past Surgical History:  Procedure Laterality Date  . ABDOMINAL HYSTERECTOMY  03/31/14   at [facility]  . CESAREAN SECTION  1990  . CHOLECYSTECTOMY  2000    Past Medical Hx:  Past Medical History:  Diagnosis Date  . Acid reflux   . Hypertension     Oncology Hx:    Ovarian cancer, bilateral ([place])   03/23/2014 Initial Diagnosis    Pelvic mass      03/31/2014 Surgery    IIIA1(i) clear cell carcinoma of the ovary       - 09/01/2014 Chemotherapy    Cycle #6 paclitaxel and carboplatin       Family Hx:  Family History  Problem Relation Age of Onset  . Cancer Brother 29    lung smoker  . Cancer Paternal Aunt 63    breast  . Cancer Maternal Grandmother 25    leukemia  . Cancer Paternal Grandmother     colorectal at unknown age   her brother died this past summer of lung cancer. He was a heavy smoker  Vitals:  There were no vitals taken for  this visit.  Physical Exam: Well-nourished well-developed female in no acute distress.  Neck: Supple, no lymphadenopathy, no thyromegaly.  Lungs: Clear to auscultation bilaterally.  Cardiovascular: Regular rate and rhythm.  Abdomen: Vertical midline incision is well-healed. Possible small reducible periumbilical hernia. Abdomen is soft, nontender, nondistended. There are no palpable masses. There is no fluid wave.  Groins: No lymphadenopathy.  Extremities: No edema.  Pelvic: Normal female genitalia. Bimanual exam reveals no masses or nodularity. Rectal confirms  Sherrina Zaugg A., MD 10/10/2015, 10:13 AM

## 2015-10-11 ENCOUNTER — Telehealth: Payer: Self-pay

## 2015-10-11 LAB — CA 125: Cancer Antigen (CA) 125: 11.4 U/mL (ref 0.0–38.1)

## 2015-10-11 NOTE — Telephone Encounter (Signed)
Orders received from [facility], APNP to contact the patient with the CA 125 level ( 11.4 ) results . Patient contacted and updated , patient states understanding and is aware that her level is slightly ,compared to her last CA 125 of  ( 7.5 )  3 months ago. Patient informed that the physicians are monitoring her levels closely . Patient also reminded of her upcoming follow up appointment with Dr Marko Plume on December 18 , 2017 at 10 AM . Patient states being aware, denies further questions at this time. Melissa Cross, APNP aware.

## 2015-11-01 ENCOUNTER — Telehealth: Payer: Self-pay | Admitting: Gynecologic Oncology

## 2015-11-01 DIAGNOSIS — C569 Malignant neoplasm of unspecified ovary: Secondary | ICD-10-CM

## 2015-11-01 NOTE — Telephone Encounter (Signed)
Left message for patient about Dr. Elenora Gamma recommendations for repeating the CA 125 one month after her previous in September.  If continuing to increase, plan for imaging.  Advised patient to please call the office to arrange for a lab appt.

## 2015-11-05 ENCOUNTER — Other Ambulatory Visit (HOSPITAL_BASED_OUTPATIENT_CLINIC_OR_DEPARTMENT_OTHER): Payer: Managed Care, Other (non HMO)

## 2015-11-05 DIAGNOSIS — C561 Malignant neoplasm of right ovary: Secondary | ICD-10-CM | POA: Diagnosis not present

## 2015-11-05 DIAGNOSIS — C569 Malignant neoplasm of unspecified ovary: Secondary | ICD-10-CM

## 2015-11-06 ENCOUNTER — Telehealth: Payer: Self-pay | Admitting: *Deleted

## 2015-11-06 ENCOUNTER — Telehealth: Payer: Self-pay | Admitting: Gynecologic Oncology

## 2015-11-06 DIAGNOSIS — C569 Malignant neoplasm of unspecified ovary: Secondary | ICD-10-CM

## 2015-11-06 LAB — CA 125: Cancer Antigen (CA) 125: 18.4 U/mL (ref 0.0–38.1)

## 2015-11-06 NOTE — Telephone Encounter (Signed)
Pt notified of future scheduled appointment. Pt is scheduled to have a CT on 11/13/15 at [facility] at 11:30. Pt was instructed to arrive at 11:15 and to pick up contrast and instructions before Tuesday. Pt stated she will come by the office on Friday .Marland Kitchen

## 2015-11-06 NOTE — Telephone Encounter (Signed)
Informed patient of CA 125 results and Dr. Elenora Gamma recommendations for a CT chest, abdomen, and pelvis since it is steadily increasing.  Verbalizing understanding.  Advised that she would receive a phone call with an appt date and time for the scan.

## 2015-11-13 ENCOUNTER — Ambulatory Visit (HOSPITAL_COMMUNITY): Payer: Managed Care, Other (non HMO)

## 2015-11-14 ENCOUNTER — Telehealth: Payer: Self-pay | Admitting: Gynecologic Oncology

## 2015-11-14 NOTE — Telephone Encounter (Signed)
Spoke with Market researcher for peer to peer for CT chest, abdomen, and pelvis.  Scans approved with Auth# [id number].  Left message for Gaspar Bidding with the above information.

## 2015-11-15 ENCOUNTER — Ambulatory Visit (HOSPITAL_COMMUNITY): Payer: Managed Care, Other (non HMO)

## 2015-11-19 ENCOUNTER — Ambulatory Visit (HOSPITAL_COMMUNITY)
Admission: RE | Admit: 2015-11-19 | Discharge: 2015-11-19 | Disposition: A | Discharge status: Home / Self Care | Payer: Managed Care, Other (non HMO) | Source: Ambulatory Visit | Attending: Gynecologic Oncology | Admitting: Gynecologic Oncology

## 2015-11-19 ENCOUNTER — Encounter (HOSPITAL_COMMUNITY): Payer: Self-pay

## 2015-11-19 DIAGNOSIS — R188 Other ascites: Secondary | ICD-10-CM | POA: Insufficient documentation

## 2015-11-19 DIAGNOSIS — C787 Secondary malignant neoplasm of liver and intrahepatic bile duct: Secondary | ICD-10-CM | POA: Insufficient documentation

## 2015-11-19 DIAGNOSIS — C7989 Secondary malignant neoplasm of other specified sites: Secondary | ICD-10-CM | POA: Diagnosis not present

## 2015-11-19 DIAGNOSIS — C786 Secondary malignant neoplasm of retroperitoneum and peritoneum: Secondary | ICD-10-CM | POA: Diagnosis not present

## 2015-11-19 DIAGNOSIS — R590 Localized enlarged lymph nodes: Secondary | ICD-10-CM | POA: Insufficient documentation

## 2015-11-19 DIAGNOSIS — C569 Malignant neoplasm of unspecified ovary: Secondary | ICD-10-CM | POA: Diagnosis present

## 2015-11-19 MED ORDER — IOPAMIDOL (ISOVUE-300) INJECTION 61%
100.0000 mL | Freq: Once | INTRAVENOUS | Status: AC | PRN
  Administered 2015-11-19: 100 mL via INTRAVENOUS

## 2015-11-22 ENCOUNTER — Ambulatory Visit (HOSPITAL_COMMUNITY): Payer: Managed Care, Other (non HMO)

## 2015-11-23 ENCOUNTER — Telehealth: Payer: Self-pay | Admitting: *Deleted

## 2015-11-23 NOTE — Telephone Encounter (Signed)
Notified patient of scheduled appointments on Nov 30, 2015 . Pt is scheduled for a lab at 8:15am and a MD appointment with Dr. Marko Plume at 8:30am. Pt agreed with time and dates of appointments

## 2015-11-29 ENCOUNTER — Other Ambulatory Visit: Payer: Self-pay

## 2015-11-29 DIAGNOSIS — C562 Malignant neoplasm of left ovary: Principal | ICD-10-CM

## 2015-11-29 DIAGNOSIS — C561 Malignant neoplasm of right ovary: Secondary | ICD-10-CM

## 2015-11-29 DIAGNOSIS — C563 Malignant neoplasm of bilateral ovaries: Secondary | ICD-10-CM

## 2015-11-30 ENCOUNTER — Other Ambulatory Visit (HOSPITAL_BASED_OUTPATIENT_CLINIC_OR_DEPARTMENT_OTHER): Payer: Managed Care, Other (non HMO)

## 2015-11-30 ENCOUNTER — Other Ambulatory Visit: Payer: Self-pay | Admitting: Oncology

## 2015-11-30 ENCOUNTER — Ambulatory Visit (HOSPITAL_BASED_OUTPATIENT_CLINIC_OR_DEPARTMENT_OTHER): Payer: Managed Care, Other (non HMO) | Admitting: Oncology

## 2015-11-30 VITALS — BP 116/61 | HR 85 | Temp 97.7°F | Resp 18 | Wt 211.3 lb

## 2015-11-30 DIAGNOSIS — C561 Malignant neoplasm of right ovary: Secondary | ICD-10-CM

## 2015-11-30 DIAGNOSIS — C562 Malignant neoplasm of left ovary: Secondary | ICD-10-CM | POA: Diagnosis not present

## 2015-11-30 DIAGNOSIS — G620 Drug-induced polyneuropathy: Secondary | ICD-10-CM | POA: Diagnosis not present

## 2015-11-30 DIAGNOSIS — C786 Secondary malignant neoplasm of retroperitoneum and peritoneum: Secondary | ICD-10-CM

## 2015-11-30 DIAGNOSIS — I878 Other specified disorders of veins: Secondary | ICD-10-CM

## 2015-11-30 DIAGNOSIS — N183 Chronic kidney disease, stage 3 unspecified: Secondary | ICD-10-CM

## 2015-11-30 DIAGNOSIS — D701 Agranulocytosis secondary to cancer chemotherapy: Secondary | ICD-10-CM

## 2015-11-30 DIAGNOSIS — C569 Malignant neoplasm of unspecified ovary: Secondary | ICD-10-CM

## 2015-11-30 DIAGNOSIS — T451X5A Adverse effect of antineoplastic and immunosuppressive drugs, initial encounter: Secondary | ICD-10-CM

## 2015-11-30 DIAGNOSIS — C563 Malignant neoplasm of bilateral ovaries: Secondary | ICD-10-CM

## 2015-11-30 LAB — COMPREHENSIVE METABOLIC PANEL
ALT: 16 U/L (ref 0–55)
AST: 13 U/L (ref 5–34)
Albumin: 3.6 g/dL (ref 3.5–5.0)
Alkaline Phosphatase: 77 U/L (ref 40–150)
Anion Gap: 11 mEq/L (ref 3–11)
BUN: 15.6 mg/dL (ref 7.0–26.0)
CHLORIDE: 103 meq/L (ref 98–109)
CO2: 24 meq/L (ref 22–29)
Calcium: 10 mg/dL (ref 8.4–10.4)
Creatinine: 1.4 mg/dL — ABNORMAL HIGH (ref 0.6–1.1)
EGFR: 47 mL/min/{1.73_m2} — AB (ref >90)
GLUCOSE: 103 mg/dL (ref 70–140)
POTASSIUM: 4.1 meq/L (ref 3.5–5.1)
SODIUM: 138 meq/L (ref 136–145)
Total Bilirubin: 0.61 mg/dL (ref 0.20–1.20)
Total Protein: 8 g/dL (ref 6.4–8.3)

## 2015-11-30 LAB — CBC WITH DIFFERENTIAL/PLATELET
BASO%: 0.9 % (ref 0.0–2.0)
BASOS ABS: 0.1 10*3/uL (ref 0.0–0.1)
EOS ABS: 0.1 10*3/uL (ref 0.0–0.5)
EOS%: 1.3 % (ref 0.0–7.0)
HCT: 30.4 % — ABNORMAL LOW (ref 34.8–46.6)
HGB: 10.3 g/dL — ABNORMAL LOW (ref 11.6–15.9)
LYMPH%: 23.9 % (ref 14.0–49.7)
MCH: 28.2 pg (ref 25.1–34.0)
MCHC: 33.7 g/dL (ref 31.5–36.0)
MCV: 83.8 fL (ref 79.5–101.0)
MONO#: 0.6 10*3/uL (ref 0.1–0.9)
MONO%: 8.8 % (ref 0.0–14.0)
NEUT%: 65.1 % (ref 38.4–76.8)
NEUTROS ABS: 4.7 10*3/uL (ref 1.5–6.5)
Platelets: 459 10*3/uL — ABNORMAL HIGH (ref 145–400)
RBC: 3.63 10*6/uL — AB (ref 3.70–5.45)
RDW: 13.9 % (ref 11.2–14.5)
WBC: 7.2 10*3/uL (ref 3.9–10.3)
lymph#: 1.7 10*3/uL (ref 0.9–3.3)

## 2015-11-30 NOTE — Progress Notes (Signed)
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 Recommendation is to resume platinum based chemotherapy, then consider PARP inhibitor if response.  Pateint saw Dr Alycia Rossetti 10-10-15, with slight increase in CA 125 to 11.4 from 7.5 in 06-2015. Repeat CA 125 on 11-05-15 was 18.4 and CT CAP 11-19-15 confirmed new progression, with hepatic subcapsular masses left and right hepatic lobes, probable serosal implant at proximal sigmoid 3.7 x 2.5 cm, nodules in mesentery small bowel and transverse colon, small bilateral external iliac nodes and presacral node 1.3 cm, small ascites. Patient has some intermittent discomfort RUQ laterally, no other abdominal or pelvic discomfort. Appetite and energy are good, no N/V. She has used miralax bid for ~ 2 months for some increased constipation, bowels moving well daily with this. No bladder symptoms. No LE swelling, no SOB, occasional slight NP cough seems post nasal drainage, no chest pain. Perhaps 8 sticks required for IV access for CT, and difficult peripheral draw for labs at this office today. No numbness now in fingers, and in feet now only minimal in toes tho she still can tolerate only flat shoes. No fever or recent symptoms of infection.  Remainder of 10 point Review of Systems negative   Flu vaccine 11-14-15 CA 125 preoperatively 56, this 5 on 09-14-14.  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  Daughter is 4th year med Ship broker.  ONCOLOGIC HISTORY  Patient had been in usual good health, up to date on all medical care,  when she developed bladder symptoms in early 2016. Urine culture showed no infection, then CT AP at [facility] 03-16-14 found perihepatic ascites and pelvic mass 9.9 x 20.1 x 17.8 cm which was cystic and solid, kidneys and ureters unremarkable, no pelvic fluid, rectosigmoid partially compressed, other organs not remarkable, no mention of adenopathy. She was seen by Dr Evie Lacks on referral from Dr Quillian Quince after CT resulted; CT findings confirmed by transvaginal US. CA 125 was 56 pre op. She saw Dr Alycia Rossetti in consultation on 03-23-14, then had surgery at [facility] on 03-31-14  TAH/BSO, omentectomy, pelvic and paraaortic node evaluation. Surgical findings were of bilateral complex ovarian masses and dense adhesions, with intra-operative spill of right ovarian mass as that was densely adherent to sigmoid serosa; posterior uterine segment also adherent to rectosigmoid, and retroperitoneal fibrosis bilaterally. Pathology [facility] [phone]) found grade 3 clear cell carcinoma involving both ovaries, largest 13 x 9.5 x 6.2 cm, + LVSI and one right pelvic node with microscopic involvement approximately 1 mm, ER PR negative, pT3a1 pN1 pMx. Cytology from ascites [facility] [phone]) was positive for adenocarcinoma of gyn primary. Multidisciplinary conference at [facility] 04-19-14 recommended carboplatin and taxane chemotherapy. Cycle 1 carbo taxol was given on 05-12-14, with neutropenia by day 11 (ANC 0.8) despite having started granix on day 8.She was hospitalized at [facility] after cycle 3 with syncope related to dehydration. She completed cycle 6 on 09-01-14. PET 08-2014 no clear residual or progressive disease; colonoscopy was unremarkable. CA 125 was 7.5 in 06-2015, then up to 18.4 by 10-2015. CT CAP 11-19-15 showed new progression subcapsular areas of liver, at proximal sigmoid colon and in  mesentery, with small ascites.    Objective:  Vital signs in last 24 hours:  BP 116/61 (BP Location: Left Arm, Patient Position: Bed low/side rails up)    Pulse 85   Temp 97.7 F (36.5 C) (Oral)   Resp 18   Wt 211 lb 4.8 oz (95.8 kg)   SpO2 100%   BMI 36.27 kg/m  Weight down 3 lbs Alert, oriented and appropriate. Ambulatory without difficulty.  No alopecia  HEENT:PERRL, sclerae not icteric. Oral mucosa moist without lesions, posterior pharynx clear.  Neck supple. No JVD.  Lymphatics:no cervical,supraclavicular, axillary or inguinal adenopathy Resp: clear to auscultation bilaterally and normal percussion bilaterally Cardio: regular rate and rhythm. No gallop. Peripheral veins not visible/ not adequate for chemo. GI: soft, nontender, not distended, no mass or organomegaly. Normally active bowel sounds. Surgical incision not remarkable. Musculoskeletal/ Extremities:LE without pitting edema, cords, tenderness Neuro: slight peripheral neuropathy toes only. Otherwise nonfocal. PSYCH appropriate mood and affect.  Skin without rash, ecchymosis, petechiae Breasts: without dominant mass, skin or nipple findings. Axillae benign.   Lab Results:  Results for orders placed or performed in visit on 11/30/15  CBC with Differential  Result Value Ref Range   WBC 7.2 3.9 - 10.3 10e3/uL   NEUT# 4.7 1.5 - 6.5 10e3/uL   HGB 10.3 (L) 11.6 - 15.9 g/dL   HCT 30.4 (L) 34.8 - 46.6 %   Platelets 459 (H) 145 - 400 10e3/uL   MCV 83.8 79.5 - 101.0 fL   MCH 28.2 25.1 - 34.0 pg   MCHC 33.7 31.5 - 36.0 g/dL   RBC 3.63 (L) 3.70 - 5.45 10e6/uL   RDW 13.9 11.2 - 14.5 %   lymph# 1.7 0.9 - 3.3 10e3/uL   MONO# 0.6 0.1 - 0.9 10e3/uL   Eosinophils Absolute 0.1 0.0 - 0.5 10e3/uL   Basophils Absolute 0.1 0.0 - 0.1 10e3/uL   NEUT% 65.1 38.4 - 76.8 %   LYMPH% 23.9 14.0 - 49.7 %   MONO% 8.8 0.0 - 14.0 %   EOS% 1.3 0.0 - 7.0 %   BASO% 0.9 0.0 - 2.0 %  Comprehensive metabolic panel  Result Value Ref Range   Sodium 138 136 - 145 mEq/L   Potassium 4.1 3.5 - 5.1 mEq/L   Chloride 103 98 - 109 mEq/L   CO2 24 22 - 29 mEq/L   Glucose 103 70 - 140 mg/dl   BUN 15.6 7.0  - 26.0 mg/dL   Creatinine 1.4 (H) 0.6 - 1.1 mg/dL   Total Bilirubin 0.61 0.20 - 1.20 mg/dL   Alkaline Phosphatase 77 40 - 150 U/L   AST 13 5 - 34 U/L   ALT 16 0 - 55 U/L   Total Protein 8.0 6.4 - 8.3 g/dL   Albumin 3.6 3.5 - 5.0 g/dL   Calcium 10.0 8.4 - 10.4 mg/dL   Anion Gap 11 3 - 11 mEq/L   EGFR 47 (L) >90 ml/min/1.73 m2  CA 125  Result Value Ref Range   Cancer Antigen (CA) 125 32.4 0.0 - 38.1 U/mL    Prior CA 125 values:  11-05-15  18.4,  10-10-15  11.4,  07-12-15  7.5 Studies/Results:  EXAM: CT CHEST, ABDOMEN, AND PELVIS WITH CONTRAST  TECHNIQUE: Multidetector CT imaging of the chest, abdomen and pelvis was performed following the standard protocol during bolus administration of intravenous contrast.  CONTRAST:  168m ISOVUE-300 IOPAMIDOL (ISOVUE-300) INJECTION 61%  COMPARISON:  PET-CT 09/27/2014, CT 09/14/2014  FINDINGS: CT CHEST FINDINGS  Cardiovascular: No  pericardial fluid. No central pulmonary embolism.  Mediastinum/Nodes: No axillary supraclavicular adenopathy. No mediastinal hilar adenopathy. No pericardial fluid esophagus normal.  Lungs/Pleura: No pulmonary nodularity.  No pleural thickening  Musculoskeletal: No aggressive osseous lesion.  CT ABDOMEN AND PELVIS FINDINGS  Hepatobiliary: New lobular subcapsular masses along the LEFT and RIGHT hepatic lobe. Mass along the falciform ligament measures 5.0 x 4.8 cm (image 56, series 2). Mass along the medial border of the RIGHT kidney measures 5.8 x 4.6  Subcapsular lesions positioned between the diaphragm and the RIGHT hepatic lobe (image 44, series 2. Lesions along the dome the liver measuring 1.3 and 2.4 cm on image 35 and 37 of series 2 respectively.  Pancreas: Pancreas is normal. No ductal dilatation. No pancreatic inflammation.  Spleen: Normal spleen  Adrenals/urinary tract: Adrenal glands and kidneys are normal. The ureters and bladder normal.  Stomach/Bowel: Stomach, small  bowel appendix cecum normal.  Potential lesion within the proximal sigmoid colon measuring 3.7 by 2.5 cm (image 98, series 2). Concern for serosal implant. No evidence of bowel obstruction. Rectum normal  Within the central mesentery of the small bowel on the LEFT there is a 9 mm nodule image 78 series 2.  Additional nodule within the mesentery of transverse colon measuring 13 mm (image 56, series 2)  Small amount of fluid within the leaves of the mesentery above the bladder.  Vascular/Lymphatic: Small bilateral external iliac lymph nodes measure 6 mm each. A presacral lymph node just below the bifurcation measures 13 mm (image 56, series 2).  Reproductive: Post hysterectomy.  No adnexal masses  Other:  Peritoneal disease. Potential subcutaneous implant along the linea alba. (image 101, series 2).  Musculoskeletal: No aggressive osseous lesion.  IMPRESSION: Chest Impression:  1. No evidence of thoracic metastasis.  Abdomen / Pelvis Impression:  1. 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 2. New large subcapsular metastatic masses along LEFT and RIGHT hepatic lobes and positioned between the diaphragm and the RIGHT hepatic lobe. 3. Peritoneal metastasis with new nodules within the mesentery of the small bowel and colon. 4. Potential serosal implant within the proximal sigmoid colon. No bowel obstruction 5. Small amount of ascites is new from prior. 6. Mild pelvic lymphadenopathy consistent with  metastatic disease.  PACs images reviewed with patient and husband at time of visit.    Medications: I have reviewed the patient's current medications. Will send prescriptions for antiemetics, decadron and EMLA  DISCUSSION  Per CareEverywhere from Dr Alycia Rossetti, 11-22-15: "Spoke with patient regarding the results of her CT scan and decision to proceed with chemotherapy. She has had a platinum free interval of  approximately 13-14 months. We will retreat with paclitaxel and carboplatin. I would then consider maintenance therapy with a Park inhibitor. My choice would be niraparib in the setting of the FDA approved indication for BRCA negative patients. I would recommend reimaging after 3 cycles as her CA-125 is not particularly informative for her. We would defer on the role of secondary cytoreductive surgery."  All of interval history reviewed with patient, including marker elevatiion (not available from 11-3 at time of visit) and recommendation to resume systemic treatment with platinum based chemotherapy, then possibly PARP inhibitor if response (CR or PR). Patient understood Dr Alycia Rossetti to say that surgery could be considered after systemic treatment if isolated disease remaining. I have told patient and husband that recurrent gyn cancer has not been curable with interventions available to this point, tho often very treatable and certainly  new treatments continuing to become available including PARP inhibitors now used for platinum responding recurrent ovarian cancer even in BRCA negative patients.   Discussed fact that recurrence >6 months from completion of adjuvant treatment is more likely to respond to retreatment with prior carbo taxol regimen. She prefers treatments on Fridays for work schedule, tho is glad to start next week on Thurs 12-06-15 as only available infusion appointment, then can move to Fridays from there. Reminded her of premedication steroids for taxol. Discussed increased chance of carboplatin reactions after 6 cycles, discussed carbo skin testing (not 100% accurate but still can be helpful). Discussed peripheral neuropathy with taxanes. As peripheral neuropathy in feet is much better, she would like to try using taxol again, tho we may not be able to use that for all of treatment depending on neuropathy.  Patient is now willing to have PAC, which I have been able to schedule at [facility] 12-04-15  arrive 0630 at radiology.   Significant cytopenias with adjuvant chemo, so will use on pro neulasta.  Verbal consent given for chemotherapy  Assessment/Plan:  1.Recurrent ovarian cancer with CT documented involvement abdomen and pelvis, now out 14 months from completion of adjuvant chemotherapy: will begin treatment with carboplatin and taxol on 12-06-15, with carbo skin testing and on pro neulasta. 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 Dense adhesions and retroperitoneal fibrosis at surgery. Carboplatin and taxol x 6 cycles 05-12-14 thru 09-01-14, with neulasta support. Genetics testing with [facility] 04-2014. CT AP8-2016 with areas of concern rectosigmoid and transverse colon, with PET 08-2014 and colonoscopy no malignancy identified. Recent increase in CA 125 marker and CT findings 11-19-15. 2,Peripheral IV access not adequate for treatment. PAC by IR at [facility] 12-04-15. 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 Will use taxol for as long as tolerated.  4.benign right breast biopsy at [facility] on 07-25-14 5.previous fulguration of endometriosis, post C section 6.GERD controlled with prescription bid prilosec 7. 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 Cc Dr Quillian Quince who manages antihypertensives. Carboplatin dosed by AUC    All questions answered; patient and husband seem to understand discussion and are in agreement with recommendations and plans. Chemo orders placed using Via pathways. Message to managed care for chemo, neulasta. Message to collaborative RN. Cc Drs Alycia Rossetti and Quillian Quince. Time spent 50 min including >50% counseling  and coordination of care.    Evlyn Clines, MD   12/02/2015, 2:31 PM

## 2015-12-01 LAB — CA 125: Cancer Antigen (CA) 125: 32.4 U/mL (ref 0.0–38.1)

## 2015-12-02 ENCOUNTER — Encounter: Payer: Self-pay | Admitting: Oncology

## 2015-12-02 DIAGNOSIS — N183 Chronic kidney disease, stage 3 unspecified: Secondary | ICD-10-CM | POA: Insufficient documentation

## 2015-12-02 DIAGNOSIS — C569 Malignant neoplasm of unspecified ovary: Secondary | ICD-10-CM | POA: Insufficient documentation

## 2015-12-02 NOTE — Progress Notes (Signed)
START ON PATHWAY REGIMEN - Ovarian  OVOS86: Carboplatin AUC=6 + Paclitaxel 175 mg/m2 q21 Days; Stop Treatment After 6 Cycles If Complete Response, Otherwise Continue Treatment Until Progression or Unacceptable Toxicity   A cycle is every 21 days:     Paclitaxel (Taxol(R)) 175 mg/m2 in 500 mL NS IV over 3 hours day 1 Dose Mod: None     Carboplatin (Paraplatin(R)) AUC=6 in 250 mL NS IV over 1 hour day 1 Dose Mod: None Additional Orders: * All AUC calculations intended to be used in [employer] formula  **Always confirm dose/schedule in your pharmacy ordering system**    Patient Characteristics: Second Line Therapy, > 12 Months Stage Grouping: IIIA AJCC T Stage: X AJCC N Stage: X AJCC M Stage: X Line of Therapy: Second Line Therapy BRCA Mutation Status: Absent Would you be surprised if this patient died  in the next year? I would be surprised if this patient died in the next year Time since last treatment: > 12 Months  Intent of Therapy: Non-Curative / Palliative Intent, Discussed with Patient

## 2015-12-03 ENCOUNTER — Telehealth: Payer: Self-pay

## 2015-12-03 ENCOUNTER — Other Ambulatory Visit: Payer: Self-pay | Admitting: Radiology

## 2015-12-03 DIAGNOSIS — C569 Malignant neoplasm of unspecified ovary: Secondary | ICD-10-CM

## 2015-12-03 MED ORDER — DEXAMETHASONE 4 MG PO TABS: ORAL_TABLET | ORAL | 2 refills | Status: DC

## 2015-12-03 MED ORDER — LIDOCAINE-PRILOCAINE 2.5-2.5 % EX CREA: TOPICAL_CREAM | CUTANEOUS | 1 refills | Status: AC

## 2015-12-03 MED ORDER — ONDANSETRON HCL 8 MG PO TABS: 8.0000 mg | ORAL_TABLET | Freq: Three times a day (TID) | ORAL | 2 refills | Status: AC | PRN

## 2015-12-03 MED ORDER — LORAZEPAM 0.5 MG PO TABS: ORAL_TABLET | ORAL | 0 refills | Status: DC

## 2015-12-03 NOTE — Telephone Encounter (Signed)
Told Audrey Porter that she needs to be NPO after midnight tonight for [facility] placement 12-04-15. Told her to use claritin as noted below . Sent prescriptions in as well.  Pt used ativan last time .  Called it in to pharmacy.

## 2015-12-03 NOTE — Telephone Encounter (Signed)
-----   Message from Gordy Levan, MD sent at 12/02/2015  3:30 PM EST ----- For [facility] at [place]  11-7 arrive radiology (yes) at 0630. She needs to be NPO after MN, which I may not have said to her.  Scripts for chemo Thurs 11-9.  [employer]ics fine. Needs decadron 20 mg 12 hrs and 6 hrs prior each dose with food 4 mg tabs #10  2 RF Zofran 8 mg q 8 hr prn #30  2 RF I think she used ativan with adjuvant chemo, but not sure if she might not have used that, ? Compazine.   So either ativan 0.5 mg SL or po q 6 hr prn nausea  #20 or compazine 10 mg Q 6 hr prn nausea #30 EMLA  Remind her claritin for taxol/ neulasta aches  thanks

## 2015-12-04 ENCOUNTER — Telehealth: Payer: Self-pay

## 2015-12-04 ENCOUNTER — Other Ambulatory Visit: Payer: Self-pay | Admitting: Oncology

## 2015-12-04 ENCOUNTER — Ambulatory Visit (HOSPITAL_COMMUNITY)
Admission: RE | Admit: 2015-12-04 | Discharge: 2015-12-04 | Disposition: A | Discharge status: Home / Self Care | Payer: Managed Care, Other (non HMO) | Source: Ambulatory Visit | Attending: Interventional Radiology | Admitting: Interventional Radiology

## 2015-12-04 ENCOUNTER — Encounter (HOSPITAL_COMMUNITY): Payer: Self-pay

## 2015-12-04 DIAGNOSIS — C787 Secondary malignant neoplasm of liver and intrahepatic bile duct: Secondary | ICD-10-CM | POA: Diagnosis not present

## 2015-12-04 DIAGNOSIS — Z801 Family history of malignant neoplasm of trachea, bronchus and lung: Secondary | ICD-10-CM | POA: Insufficient documentation

## 2015-12-04 DIAGNOSIS — Z79899 Other long term (current) drug therapy: Secondary | ICD-10-CM | POA: Insufficient documentation

## 2015-12-04 DIAGNOSIS — Z90710 Acquired absence of both cervix and uterus: Secondary | ICD-10-CM | POA: Insufficient documentation

## 2015-12-04 DIAGNOSIS — C786 Secondary malignant neoplasm of retroperitoneum and peritoneum: Secondary | ICD-10-CM | POA: Diagnosis not present

## 2015-12-04 DIAGNOSIS — C562 Malignant neoplasm of left ovary: Principal | ICD-10-CM

## 2015-12-04 DIAGNOSIS — C569 Malignant neoplasm of unspecified ovary: Secondary | ICD-10-CM | POA: Diagnosis not present

## 2015-12-04 DIAGNOSIS — C561 Malignant neoplasm of right ovary: Secondary | ICD-10-CM

## 2015-12-04 DIAGNOSIS — K219 Gastro-esophageal reflux disease without esophagitis: Secondary | ICD-10-CM | POA: Diagnosis not present

## 2015-12-04 DIAGNOSIS — I10 Essential (primary) hypertension: Secondary | ICD-10-CM | POA: Insufficient documentation

## 2015-12-04 DIAGNOSIS — C563 Malignant neoplasm of bilateral ovaries: Secondary | ICD-10-CM

## 2015-12-04 HISTORY — DX: Malignant neoplasm of unspecified ovary: C56.9

## 2015-12-04 HISTORY — PX: IR GENERIC HISTORICAL: IMG1180011

## 2015-12-04 LAB — CBC WITH DIFFERENTIAL/PLATELET
Basophils Absolute: 0 10*3/uL (ref 0.0–0.1)
Basophils Relative: 0 %
Eosinophils Absolute: 0.2 10*3/uL (ref 0.0–0.7)
Eosinophils Relative: 2 %
HEMATOCRIT: 30.1 % — AB (ref 36.0–46.0)
HEMOGLOBIN: 10 g/dL — AB (ref 12.0–15.0)
LYMPHS ABS: 1.9 10*3/uL (ref 0.7–4.0)
LYMPHS PCT: 27 %
MCH: 27.5 pg (ref 26.0–34.0)
MCHC: 33.2 g/dL (ref 30.0–36.0)
MCV: 82.7 fL (ref 78.0–100.0)
MONO ABS: 0.6 10*3/uL (ref 0.1–1.0)
Monocytes Relative: 8 %
NEUTROS ABS: 4.5 10*3/uL (ref 1.7–7.7)
NEUTROS PCT: 63 %
Platelets: 439 10*3/uL — ABNORMAL HIGH (ref 150–400)
RBC: 3.64 MIL/uL — ABNORMAL LOW (ref 3.87–5.11)
RDW: 13.7 % (ref 11.5–15.5)
WBC: 7.1 10*3/uL (ref 4.0–10.5)

## 2015-12-04 LAB — BASIC METABOLIC PANEL
ANION GAP: 9 (ref 5–15)
BUN: 19 mg/dL (ref 6–20)
CHLORIDE: 103 mmol/L (ref 101–111)
CO2: 26 mmol/L (ref 22–32)
Calcium: 9.7 mg/dL (ref 8.9–10.3)
Creatinine, Ser: 1.6 mg/dL — ABNORMAL HIGH (ref 0.44–1.00)
GFR calc non Af Amer: 35 mL/min — ABNORMAL LOW (ref >60)
GFR, EST AFRICAN AMERICAN: 41 mL/min — AB (ref >60)
GLUCOSE: 110 mg/dL — AB (ref 65–99)
POTASSIUM: 3.8 mmol/L (ref 3.5–5.1)
Sodium: 138 mmol/L (ref 135–145)

## 2015-12-04 LAB — PROTIME-INR
INR: 1.1
Prothrombin Time: 14.3 seconds (ref 11.4–15.2)

## 2015-12-04 LAB — APTT: aPTT: 39 seconds — ABNORMAL HIGH (ref 24–36)

## 2015-12-04 MED ORDER — SODIUM CHLORIDE 0.9 % IV SOLN: INTRAVENOUS | Status: DC

## 2015-12-04 MED ORDER — FENTANYL CITRATE (PF) 100 MCG/2ML IJ SOLN
INTRAMUSCULAR | Status: AC
  Filled 2015-12-04: qty 2

## 2015-12-04 MED ORDER — HEPARIN SOD (PORK) LOCK FLUSH 100 UNIT/ML IV SOLN
INTRAVENOUS | Status: AC
  Filled 2015-12-04: qty 5

## 2015-12-04 MED ORDER — FENTANYL CITRATE (PF) 100 MCG/2ML IJ SOLN
INTRAMUSCULAR | Status: AC | PRN
  Administered 2015-12-04: 50 ug via INTRAVENOUS
  Administered 2015-12-04 (×2): 25 ug via INTRAVENOUS

## 2015-12-04 MED ORDER — LIDOCAINE-EPINEPHRINE 2 %-1:100000 IJ SOLN
INTRAMUSCULAR | Status: AC | PRN
  Administered 2015-12-04: 20 mL

## 2015-12-04 MED ORDER — MIDAZOLAM HCL 2 MG/2ML IJ SOLN
INTRAMUSCULAR | Status: AC
  Filled 2015-12-04: qty 2

## 2015-12-04 MED ORDER — LIDOCAINE-EPINEPHRINE (PF) 1 %-1:200000 IJ SOLN
INTRAMUSCULAR | Status: AC
  Filled 2015-12-04: qty 30

## 2015-12-04 MED ORDER — CEFAZOLIN SODIUM-DEXTROSE 2-4 GM/100ML-% IV SOLN
2.0000 g | INTRAVENOUS | Status: AC
  Administered 2015-12-04: 2 g via INTRAVENOUS

## 2015-12-04 MED ORDER — CEFAZOLIN SODIUM-DEXTROSE 2-4 GM/100ML-% IV SOLN
INTRAVENOUS | Status: AC
  Filled 2015-12-04: qty 100

## 2015-12-04 MED ORDER — MIDAZOLAM HCL 2 MG/2ML IJ SOLN
INTRAMUSCULAR | Status: AC | PRN
  Administered 2015-12-04: 1 mg via INTRAVENOUS
  Administered 2015-12-04 (×2): 0.5 mg via INTRAVENOUS

## 2015-12-04 NOTE — Discharge Instructions (Signed)
Implanted Port Insertion, Care After °Refer to this sheet in the next few weeks. These instructions provide you with information on caring for yourself after your procedure. Your health care provider may also give you more specific instructions. Your treatment has been planned according to current medical practices, but problems sometimes occur. Call your health care provider if you have any problems or questions after your procedure. °WHAT TO EXPECT AFTER THE PROCEDURE °After your procedure, it is typical to have the following:  °· Discomfort at the port insertion site. Ice packs to the area will help. °· Bruising on the skin over the port. This will subside in 3-4 days. °HOME CARE INSTRUCTIONS °· After your port is placed, you will get a manufacturer's information card. The card has information about your port. Keep this card with you at all times.   °· Know what kind of port you have. There are many types of ports available.   °· Wear a medical alert bracelet in case of an emergency. This can help alert health care workers that you have a port.   °· The port can stay in for as long as your health care provider believes it is necessary.   °· A home health care nurse may give medicines and take care of the port.   °· You or a family member can get special training and directions for giving medicine and taking care of the port at home.   °SEEK MEDICAL CARE IF:  °· Your port does not flush or you are unable to get a blood return.   °· You have a fever or chills. °SEEK IMMEDIATE MEDICAL CARE IF: °· You have new fluid or pus coming from your incision.   °· You notice a bad smell coming from your incision site.   °· You have swelling, pain, or more redness at the incision or port site.   °· You have chest pain or shortness of breath. °  °This information is not intended to replace advice given to you by your health care provider. Make sure you discuss any questions you have with your health care provider. °  °Document  Released: 11/03/2012 Document Revised: 01/18/2013 Document Reviewed: 11/03/2012 °Elsevier Interactive Patient Education ©2016 [employer] ° °

## 2015-12-04 NOTE — Sedation Documentation (Signed)
Patient is resting comfortably. No complaints from pt at this time, pt tolerated procedure well.

## 2015-12-04 NOTE — H&P (Signed)
Chief Complaint: Patient was seen in consultation today for [facility] A Cath placement at the request of Porter,Audrey P  Referring Physician(s): Porter,Audrey P  Supervising Physician: Audrey Porter  Patient Status: [facility] - Out-pt  History of Present Illness: Audrey Porter is a 55 y.o. female   Dx Ovarian Ca 2016 Now recurrence CT 10/23: 1. 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 2. New large subcapsular metastatic masses along LEFT and RIGHT hepatic lobes and positioned between the diaphragm and the RIGHT hepatic lobe. 3. Peritoneal metastasis with new nodules within the mesentery of the small bowel and colon. 4. Potential serosal implant within the proximal sigmoid colon. No bowel obstruction 5. Small amount of ascites is new from prior. 6. Mild pelvic lymphadenopathy consistent with  metastatic disease  To start Chemo Thursday Scheduled for [facility] A Cath placement in IR  Past Medical History:  Diagnosis Date  . Acid reflux   . Hypertension   . Ovarian ca [facility])     Past Surgical History:  Procedure Laterality Date  . ABDOMINAL HYSTERECTOMY  03/31/14   at [facility]  . CESAREAN SECTION  1990  . CHOLECYSTECTOMY  2000    Allergies: Sulfa antibiotics  Medications: Prior to Admission medications   Medication Sig Start Date End Date Taking? Authorizing Provider  cetirizine (ZYRTEC) 5 MG tablet Take 5 mg by mouth daily.   Yes Historical Provider, MD  fluticasone (FLONASE) 50 MCG/ACT nasal spray Place 1 spray into both nostrils daily as needed.  01/30/14  Yes Historical Provider, MD  ibuprofen (ADVIL,MOTRIN) 200 MG tablet Take 200-400 mg by mouth every 8 (eight) hours as needed.   Yes Historical Provider, MD  losartan-hydrochlorothiazide (HYZAAR) 100-25 MG per tablet Take 1 tablet by mouth daily. Pt takes this medication needed depending on her BP checks.   Yes Historical Provider, MD  Multiple Vitamins-Minerals (CENTRUM  SILVER ULTRA WOMENS PO) Take 1 tablet by mouth daily.   Yes Historical Provider, MD  omeprazole (PRILOSEC) 20 MG capsule Take 20 mg by mouth at bedtime.   Yes Historical Provider, MD  polyethylene glycol (MIRALAX / GLYCOLAX) packet Take 17 g by mouth 2 (two) times daily.    Yes Historical Provider, MD  dexamethasone (DECADRON) 4 MG tablet Take 5 tablets by mouth with food 12 hrs and 6 hrs prior to Taxol Chemotherapy 12/03/15   Audrey Marion Downer, MD  lidocaine-prilocaine (EMLA) cream Apply 1-2 hours prior to [facility] access as directed. 12/03/15   Audrey Marion Downer, MD  LORazepam (ATIVAN) 0.5 MG tablet Place 1 tablet under the tongue or swallow every 6 hrs as needed for nausea. Will make drowsy. 12/03/15   Audrey Marion Downer, MD  ondansetron (ZOFRAN) 8 MG tablet Take 1 tablet (8 mg total) by mouth every 8 (eight) hours as needed for nausea (Will not make drowsy). 12/03/15   Audrey Marion Downer, MD     Family History  Problem Relation Age of Onset  . Cancer Brother 77    lung smoker  . Cancer Paternal Aunt 35    breast  . Cancer Maternal Grandmother 25    leukemia  . Cancer Paternal Grandmother     colorectal at unknown age    Social History   Social History  . Marital status: Married    Spouse name: N/A  . Number of children: N/A  . Years of education: N/A   Social History Main Topics  . Smoking status: Never Smoker  .  Smokeless tobacco: Never Used  . Alcohol use No  . Drug use: No  . Sexual activity: No   Other Topics Concern  . None   Social History Narrative  . None     Review of Systems: A 12 point ROS discussed and pertinent positives are indicated in the HPI above.  All other systems are negative.  Review of Systems  Constitutional: Negative for activity change, appetite change, fatigue and fever.  Respiratory: Negative for cough and shortness of breath.   Gastrointestinal: Negative for abdominal pain.  Psychiatric/Behavioral: Negative for behavioral problems and  confusion.    Vital Signs: BP 107/67   Pulse 76   Temp 98.8 F (37.1 C) (Oral)   Resp 18   Ht 5\' 5"  (1.651 m)   Wt 208 lb (94.3 kg)   SpO2 99%   BMI 34.61 kg/m   Physical Exam  Constitutional: She is oriented to person, place, and time. She appears well-nourished.  Cardiovascular: Normal rate, regular rhythm and normal heart sounds.   Pulmonary/Chest: Effort normal and breath sounds normal. She has no wheezes.  Abdominal: Soft. Bowel sounds are normal. There is no tenderness.  Musculoskeletal: Normal range of motion. She exhibits no edema.  Neurological: She is alert and oriented to person, place, and time.  Skin: Skin is warm and dry.  Psychiatric: She has a normal mood and affect. Her behavior is normal. Judgment and thought content normal.  Nursing note and vitals reviewed.   Mallampati Score:  MD Evaluation Airway: WNL Heart: WNL Abdomen: WNL Chest/ Lungs: WNL ASA  Classification: 3 Mallampati/Airway Score: One  Imaging: Ct Chest W Contrast  Result Date: 11/19/2015 CLINICAL DATA:  Elevated CA 125. RIGHT abdominal pain to 3 weeks. History of ovarian cancer diagnosed August 2016. EXAM: CT CHEST, ABDOMEN, AND PELVIS WITH CONTRAST TECHNIQUE: Multidetector CT imaging of the chest, abdomen and pelvis was performed following the standard protocol during bolus administration of intravenous contrast. CONTRAST:  153mL ISOVUE-300 IOPAMIDOL (ISOVUE-300) INJECTION 61% COMPARISON:  PET-CT 09/27/2014, CT 09/14/2014 FINDINGS: CT CHEST FINDINGS Cardiovascular: No pericardial fluid. No central pulmonary embolism. Mediastinum/Nodes: No axillary supraclavicular adenopathy. No mediastinal hilar adenopathy. No pericardial fluid esophagus normal. Lungs/Pleura: No pulmonary nodularity.  No pleural thickening Musculoskeletal: No aggressive osseous lesion. CT ABDOMEN AND PELVIS FINDINGS Hepatobiliary: New lobular subcapsular masses along the LEFT and RIGHT hepatic lobe. Mass along the falciform  ligament measures 5.0 x 4.8 cm (image 56, series 2). Mass along the medial border of the RIGHT kidney measures 5.8 x 4.6 Subcapsular lesions positioned between the diaphragm and the RIGHT hepatic lobe (image 44, series 2. Lesions along the dome the liver measuring 1.3 and 2.4 cm on image 35 and 37 of series 2 respectively. Pancreas: Pancreas is normal. No ductal dilatation. No pancreatic inflammation. Spleen: Normal spleen Adrenals/urinary tract: Adrenal glands and kidneys are normal. The ureters and bladder normal. Stomach/Bowel: Stomach, small bowel appendix cecum normal. Potential lesion within the proximal sigmoid colon measuring 3.7 by 2.5 cm (image 98, series 2). Concern for serosal implant. No evidence of bowel obstruction. Rectum normal Within the central mesentery of the small bowel on the LEFT there is a 9 mm nodule image 78 series 2. Additional nodule within the mesentery of transverse colon measuring 13 mm (image 56, series 2) Small amount of fluid within the leaves of the mesentery above the bladder. Vascular/Lymphatic: Small bilateral external iliac lymph nodes measure 6 mm each. A presacral lymph node just below the bifurcation measures 13 mm (image 56,  series 2). Reproductive: Post hysterectomy.  No adnexal masses Other: Peritoneal disease. Potential subcutaneous implant along the linea alba. (image 101, series 2). Musculoskeletal: No aggressive osseous lesion. IMPRESSION: Chest Impression: 1. No evidence of thoracic metastasis. Abdomen / Pelvis Impression: 1. 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 2. New large subcapsular metastatic masses along LEFT and RIGHT hepatic lobes and positioned between the diaphragm and the RIGHT hepatic lobe. 3. Peritoneal metastasis with new nodules within the mesentery of the small bowel and colon. 4. Potential serosal implant within the proximal sigmoid colon. No bowel obstruction 5. Small amount of ascites is new  from prior. 6. Mild pelvic lymphadenopathy consistent with  metastatic disease. These results will be called to the ordering clinician or representative by the Radiologist Assistant, and communication documented in the PACS or zVision Dashboard. Electronically Signed   By: Suzy Bouchard M.D.   On: 11/19/2015 16:15   Ct Abdomen Pelvis W Contrast  Result Date: 11/19/2015 CLINICAL DATA:  Elevated CA 125. RIGHT abdominal pain to 3 weeks. History of ovarian cancer diagnosed August 2016. EXAM: CT CHEST, ABDOMEN, AND PELVIS WITH CONTRAST TECHNIQUE: Multidetector CT imaging of the chest, abdomen and pelvis was performed following the standard protocol during bolus administration of intravenous contrast. CONTRAST:  12mL ISOVUE-300 IOPAMIDOL (ISOVUE-300) INJECTION 61% COMPARISON:  PET-CT 09/27/2014, CT 09/14/2014 FINDINGS: CT CHEST FINDINGS Cardiovascular: No pericardial fluid. No central pulmonary embolism. Mediastinum/Nodes: No axillary supraclavicular adenopathy. No mediastinal hilar adenopathy. No pericardial fluid esophagus normal. Lungs/Pleura: No pulmonary nodularity.  No pleural thickening Musculoskeletal: No aggressive osseous lesion. CT ABDOMEN AND PELVIS FINDINGS Hepatobiliary: New lobular subcapsular masses along the LEFT and RIGHT hepatic lobe. Mass along the falciform ligament measures 5.0 x 4.8 cm (image 56, series 2). Mass along the medial border of the RIGHT kidney measures 5.8 x 4.6 Subcapsular lesions positioned between the diaphragm and the RIGHT hepatic lobe (image 44, series 2. Lesions along the dome the liver measuring 1.3 and 2.4 cm on image 35 and 37 of series 2 respectively. Pancreas: Pancreas is normal. No ductal dilatation. No pancreatic inflammation. Spleen: Normal spleen Adrenals/urinary tract: Adrenal glands and kidneys are normal. The ureters and bladder normal. Stomach/Bowel: Stomach, small bowel appendix cecum normal. Potential lesion within the proximal sigmoid colon measuring 3.7  by 2.5 cm (image 98, series 2). Concern for serosal implant. No evidence of bowel obstruction. Rectum normal Within the central mesentery of the small bowel on the LEFT there is a 9 mm nodule image 78 series 2. Additional nodule within the mesentery of transverse colon measuring 13 mm (image 56, series 2) Small amount of fluid within the leaves of the mesentery above the bladder. Vascular/Lymphatic: Small bilateral external iliac lymph nodes measure 6 mm each. A presacral lymph node just below the bifurcation measures 13 mm (image 56, series 2). Reproductive: Post hysterectomy.  No adnexal masses Other: Peritoneal disease. Potential subcutaneous implant along the linea alba. (image 101, series 2). Musculoskeletal: No aggressive osseous lesion. IMPRESSION: Chest Impression: 1. No evidence of thoracic metastasis. Abdomen / Pelvis Impression: 1. 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 2. New large subcapsular metastatic masses along LEFT and RIGHT hepatic lobes and positioned between the diaphragm and the RIGHT hepatic lobe. 3. Peritoneal metastasis with new nodules within the mesentery of the small bowel and colon. 4. Potential serosal implant within the proximal sigmoid colon. No bowel obstruction 5. Small amount of ascites is new from prior. 6. Mild  pelvic lymphadenopathy consistent with  metastatic disease. These results will be called to the ordering clinician or representative by the Radiologist Assistant, and communication documented in the PACS or zVision Dashboard. Electronically Signed   By: Suzy Bouchard M.D.   On: 11/19/2015 16:15    Labs:  CBC:  Recent Labs  01/08/15 1414 04/04/15 0942 07/12/15 1002 11/30/15 0834  WBC 5.6 4.6 4.3 7.2  HGB 11.5* 11.5* 11.1* 10.3*  HCT 34.1* 33.3* 31.9* 30.4*  PLT 287 251 304 459*    COAGS: No results for input(s): INR, APTT in the last 8760 hours.  BMP:  Recent Labs  01/08/15 1414 04/04/15 0942  07/12/15 1002 11/30/15 0834  NA 139 140 138 138  K 3.9 4.1 3.5 4.1  CO2 25 23 27 24   GLUCOSE 85 92 107 103  BUN 21.3 20.5 24.3 15.6  CALCIUM 9.6 9.7 9.8 10.0  CREATININE 1.4* 1.5* 1.7* 1.4*    LIVER FUNCTION TESTS:  Recent Labs  01/08/15 1414 04/04/15 0942 07/12/15 1002 11/30/15 0834  BILITOT 0.44 0.52 0.56 0.61  AST 17 15 14 13   ALT 20 19 18 16   ALKPHOS 56 56 52 77  PROT 7.4 7.4 7.7 8.0  ALBUMIN 4.2 4.0 4.1 3.6    TUMOR MARKERS: No results for input(s): AFPTM, CEA, CA199, CHROMGRNA in the last 8760 hours.  Assessment and Plan:  Hx Ovarian Ca Now with recurrence Scheduled to start Chemo therapy Thursday this week Now for [facility] placement in IR Risks and Benefits discussed with the patient including, but not limited to bleeding, infection, pneumothorax, or fibrin sheath development and need for additional procedures. All of the patient's questions were answered, patient is agreeable to proceed. Consent signed and in chart.  Thank you for this interesting consult.  I greatly enjoyed meeting AZZAREYA OLANO and look forward to participating in their care.  A copy of this report was sent to the requesting provider on this date.  Electronically Signed: Mamadou Breon A 12/04/2015, 7:21 AM   I spent a total of  30 Minutes   in face to face in clinical consultation, greater than 50% of which was counseling/coordinating care for [facility]

## 2015-12-04 NOTE — Sedation Documentation (Signed)
Patient is resting comfortably. 

## 2015-12-04 NOTE — Sedation Documentation (Signed)
Pt c/o pain

## 2015-12-04 NOTE — Sedation Documentation (Signed)
Vital signs stable. 

## 2015-12-04 NOTE — Procedures (Signed)
S/p RT IJ POWER PORT  TIP SVCRA NO COMP STABLE FULL REPORT IN PACS

## 2015-12-04 NOTE — Telephone Encounter (Signed)
Faxed completed  FMLA forms . Sent a copy to HIM to be scanned into patient's EMR.

## 2015-12-05 ENCOUNTER — Encounter: Payer: Self-pay | Admitting: Pharmacist

## 2015-12-05 ENCOUNTER — Other Ambulatory Visit: Payer: Self-pay | Admitting: Oncology

## 2015-12-06 ENCOUNTER — Other Ambulatory Visit: Payer: Self-pay | Admitting: Oncology

## 2015-12-06 ENCOUNTER — Other Ambulatory Visit (HOSPITAL_BASED_OUTPATIENT_CLINIC_OR_DEPARTMENT_OTHER): Payer: Managed Care, Other (non HMO)

## 2015-12-06 ENCOUNTER — Ambulatory Visit: Payer: Managed Care, Other (non HMO)

## 2015-12-06 ENCOUNTER — Ambulatory Visit (HOSPITAL_BASED_OUTPATIENT_CLINIC_OR_DEPARTMENT_OTHER): Payer: Managed Care, Other (non HMO)

## 2015-12-06 VITALS — BP 109/77 | HR 79 | Temp 98.3°F | Resp 18

## 2015-12-06 DIAGNOSIS — C561 Malignant neoplasm of right ovary: Secondary | ICD-10-CM | POA: Diagnosis not present

## 2015-12-06 DIAGNOSIS — Z5111 Encounter for antineoplastic chemotherapy: Secondary | ICD-10-CM

## 2015-12-06 DIAGNOSIS — Z5189 Encounter for other specified aftercare: Secondary | ICD-10-CM

## 2015-12-06 DIAGNOSIS — C562 Malignant neoplasm of left ovary: Secondary | ICD-10-CM

## 2015-12-06 DIAGNOSIS — C563 Malignant neoplasm of bilateral ovaries: Secondary | ICD-10-CM

## 2015-12-06 LAB — COMPREHENSIVE METABOLIC PANEL
ALT: 14 U/L (ref 0–55)
ANION GAP: 11 meq/L (ref 3–11)
AST: 12 U/L (ref 5–34)
Albumin: 3.4 g/dL — ABNORMAL LOW (ref 3.5–5.0)
Alkaline Phosphatase: 81 U/L (ref 40–150)
BILIRUBIN TOTAL: 0.31 mg/dL (ref 0.20–1.20)
BUN: 20.7 mg/dL (ref 7.0–26.0)
CALCIUM: 10 mg/dL (ref 8.4–10.4)
CHLORIDE: 105 meq/L (ref 98–109)
CO2: 23 meq/L (ref 22–29)
Creatinine: 1.5 mg/dL — ABNORMAL HIGH (ref 0.6–1.1)
EGFR: 44 mL/min/{1.73_m2} — AB (ref >90)
Glucose: 162 mg/dl — ABNORMAL HIGH (ref 70–140)
Potassium: 4.2 mEq/L (ref 3.5–5.1)
Sodium: 139 mEq/L (ref 136–145)
Total Protein: 8 g/dL (ref 6.4–8.3)

## 2015-12-06 MED ORDER — SODIUM CHLORIDE 0.9% FLUSH
10.0000 mL | INTRAVENOUS | Status: DC | PRN
  Administered 2015-12-06: 10 mL via INTRAVENOUS
  Filled 2015-12-06: qty 10

## 2015-12-06 MED ORDER — SODIUM CHLORIDE 0.9 % IV SOLN
Freq: Once | INTRAVENOUS | Status: AC
  Administered 2015-12-06: 10:00:00 via INTRAVENOUS

## 2015-12-06 MED ORDER — CARBOPLATIN CHEMO INTRADERMAL TEST DOSE 100MCG/0.02ML
100.0000 ug | Freq: Once | INTRADERMAL | Status: AC
  Administered 2015-12-06: 100 ug via INTRADERMAL
  Filled 2015-12-06: qty 0.02

## 2015-12-06 MED ORDER — SODIUM CHLORIDE 0.9 % IV SOLN
353.2000 mg | Freq: Once | INTRAVENOUS | Status: AC
  Administered 2015-12-06: 350 mg via INTRAVENOUS
  Filled 2015-12-06: qty 35

## 2015-12-06 MED ORDER — FAMOTIDINE IN NACL 20-0.9 MG/50ML-% IV SOLN
INTRAVENOUS | Status: AC
  Filled 2015-12-06: qty 50

## 2015-12-06 MED ORDER — ONDANSETRON HCL 8 MG PO TABS
8.0000 mg | ORAL_TABLET | Freq: Once | ORAL | Status: AC
  Administered 2015-12-06: 8 mg via ORAL

## 2015-12-06 MED ORDER — ONDANSETRON HCL 8 MG PO TABS
ORAL_TABLET | ORAL | Status: AC
  Filled 2015-12-06: qty 1

## 2015-12-06 MED ORDER — FAMOTIDINE IN NACL 20-0.9 MG/50ML-% IV SOLN
20.0000 mg | Freq: Once | INTRAVENOUS | Status: AC
  Administered 2015-12-06: 20 mg via INTRAVENOUS

## 2015-12-06 MED ORDER — PEGFILGRASTIM 6 MG/0.6ML ~~LOC~~ PSKT
6.0000 mg | PREFILLED_SYRINGE | Freq: Once | SUBCUTANEOUS | Status: AC
  Administered 2015-12-06: 6 mg via SUBCUTANEOUS
  Filled 2015-12-06: qty 0.6

## 2015-12-06 MED ORDER — DIPHENHYDRAMINE HCL 50 MG/ML IJ SOLN
INTRAMUSCULAR | Status: AC
  Filled 2015-12-06: qty 1

## 2015-12-06 MED ORDER — DIPHENHYDRAMINE HCL 50 MG/ML IJ SOLN
25.0000 mg | Freq: Once | INTRAMUSCULAR | Status: AC
  Administered 2015-12-06: 25 mg via INTRAVENOUS

## 2015-12-06 MED ORDER — DEXTROSE 5 % IV SOLN
135.0000 mg/m2 | Freq: Once | INTRAVENOUS | Status: AC
  Administered 2015-12-06: 282 mg via INTRAVENOUS
  Filled 2015-12-06: qty 47

## 2015-12-06 MED ORDER — SODIUM CHLORIDE 0.9 % IV SOLN
Freq: Once | INTRAVENOUS | Status: AC
  Administered 2015-12-06: 11:00:00 via INTRAVENOUS
  Filled 2015-12-06: qty 5

## 2015-12-06 MED ORDER — SODIUM CHLORIDE 0.9% FLUSH
10.0000 mL | INTRAVENOUS | Status: DC | PRN
  Administered 2015-12-06: 10 mL
  Filled 2015-12-06: qty 10

## 2015-12-06 MED ORDER — HEPARIN SOD (PORK) LOCK FLUSH 100 UNIT/ML IV SOLN
500.0000 [IU] | Freq: Once | INTRAVENOUS | Status: AC | PRN
  Administered 2015-12-06: 500 [IU]
  Filled 2015-12-06: qty 5

## 2015-12-06 NOTE — Progress Notes (Signed)
Per Dr Marko Plume: ok to treat with Creat of 1.6

## 2015-12-12 ENCOUNTER — Other Ambulatory Visit: Payer: Self-pay | Admitting: Oncology

## 2015-12-12 DIAGNOSIS — C569 Malignant neoplasm of unspecified ovary: Secondary | ICD-10-CM

## 2015-12-13 ENCOUNTER — Ambulatory Visit (HOSPITAL_BASED_OUTPATIENT_CLINIC_OR_DEPARTMENT_OTHER): Payer: Managed Care, Other (non HMO)

## 2015-12-13 ENCOUNTER — Telehealth: Payer: Self-pay

## 2015-12-13 ENCOUNTER — Ambulatory Visit (HOSPITAL_BASED_OUTPATIENT_CLINIC_OR_DEPARTMENT_OTHER): Payer: Managed Care, Other (non HMO) | Admitting: Oncology

## 2015-12-13 ENCOUNTER — Other Ambulatory Visit (HOSPITAL_BASED_OUTPATIENT_CLINIC_OR_DEPARTMENT_OTHER): Payer: Managed Care, Other (non HMO)

## 2015-12-13 ENCOUNTER — Encounter: Payer: Self-pay | Admitting: Oncology

## 2015-12-13 VITALS — BP 129/66 | HR 98 | Temp 98.4°F | Resp 18 | Ht 65.0 in | Wt 208.3 lb

## 2015-12-13 DIAGNOSIS — C562 Malignant neoplasm of left ovary: Secondary | ICD-10-CM

## 2015-12-13 DIAGNOSIS — G620 Drug-induced polyneuropathy: Secondary | ICD-10-CM

## 2015-12-13 DIAGNOSIS — C561 Malignant neoplasm of right ovary: Secondary | ICD-10-CM | POA: Diagnosis not present

## 2015-12-13 DIAGNOSIS — D701 Agranulocytosis secondary to cancer chemotherapy: Secondary | ICD-10-CM

## 2015-12-13 DIAGNOSIS — Z95828 Presence of other vascular implants and grafts: Secondary | ICD-10-CM

## 2015-12-13 DIAGNOSIS — C563 Malignant neoplasm of bilateral ovaries: Secondary | ICD-10-CM

## 2015-12-13 DIAGNOSIS — N183 Chronic kidney disease, stage 3 unspecified: Secondary | ICD-10-CM

## 2015-12-13 DIAGNOSIS — T451X5A Adverse effect of antineoplastic and immunosuppressive drugs, initial encounter: Secondary | ICD-10-CM

## 2015-12-13 DIAGNOSIS — C569 Malignant neoplasm of unspecified ovary: Secondary | ICD-10-CM | POA: Diagnosis not present

## 2015-12-13 DIAGNOSIS — D6481 Anemia due to antineoplastic chemotherapy: Secondary | ICD-10-CM

## 2015-12-13 LAB — COMPREHENSIVE METABOLIC PANEL
ALBUMIN: 3.2 g/dL — AB (ref 3.5–5.0)
ALK PHOS: 82 U/L (ref 40–150)
ALT: 19 U/L (ref 0–55)
AST: 14 U/L (ref 5–34)
Anion Gap: 8 mEq/L (ref 3–11)
BUN: 15.8 mg/dL (ref 7.0–26.0)
CO2: 24 meq/L (ref 22–29)
Calcium: 9 mg/dL (ref 8.4–10.4)
Chloride: 103 mEq/L (ref 98–109)
Creatinine: 1.1 mg/dL (ref 0.6–1.1)
EGFR: 64 mL/min/{1.73_m2} — AB (ref >90)
GLUCOSE: 96 mg/dL (ref 70–140)
POTASSIUM: 4 meq/L (ref 3.5–5.1)
SODIUM: 135 meq/L — AB (ref 136–145)
TOTAL PROTEIN: 6.8 g/dL (ref 6.4–8.3)
Total Bilirubin: 0.25 mg/dL (ref 0.20–1.20)

## 2015-12-13 LAB — CBC WITH DIFFERENTIAL/PLATELET
BASO%: 0.5 % (ref 0.0–2.0)
Basophils Absolute: 0.1 10*3/uL (ref 0.0–0.1)
EOS%: 1.5 % (ref 0.0–7.0)
Eosinophils Absolute: 0.1 10*3/uL (ref 0.0–0.5)
HCT: 26.7 % — ABNORMAL LOW (ref 34.8–46.6)
HEMOGLOBIN: 8.9 g/dL — AB (ref 11.6–15.9)
LYMPH%: 22 % (ref 14.0–49.7)
MCH: 27.3 pg (ref 25.1–34.0)
MCHC: 33.2 g/dL (ref 31.5–36.0)
MCV: 82.3 fL (ref 79.5–101.0)
MONO#: 1.8 10*3/uL — ABNORMAL HIGH (ref 0.1–0.9)
MONO%: 17.9 % — AB (ref 0.0–14.0)
NEUT%: 58.1 % (ref 38.4–76.8)
NEUTROS ABS: 5.8 10*3/uL (ref 1.5–6.5)
Platelets: 232 10*3/uL (ref 145–400)
RBC: 3.25 10*6/uL — AB (ref 3.70–5.45)
RDW: 14 % (ref 11.2–14.5)
WBC: 10 10*3/uL (ref 3.9–10.3)
lymph#: 2.2 10*3/uL (ref 0.9–3.3)

## 2015-12-13 MED ORDER — SODIUM CHLORIDE 0.9% FLUSH
10.0000 mL | INTRAVENOUS | Status: DC | PRN
  Administered 2015-12-13: 10 mL via INTRAVENOUS
  Filled 2015-12-13: qty 10

## 2015-12-13 MED ORDER — HEPARIN SOD (PORK) LOCK FLUSH 100 UNIT/ML IV SOLN
500.0000 [IU] | Freq: Once | INTRAVENOUS | Status: AC | PRN
  Administered 2015-12-13: 500 [IU] via INTRAVENOUS
  Filled 2015-12-13: qty 5

## 2015-12-13 NOTE — Telephone Encounter (Signed)
faxed FMLA, to [place], copy to be given to pt at today's visit. Copy to be scanned into records. Called pt to let her know.

## 2015-12-13 NOTE — Progress Notes (Signed)
OFFICE PROGRESS NOTE   December 13, 2015   Physicians: Barron Alvine, Mitzie Na, MD (PCP), Alden Hipp), Gari Crown [facility])  INTERVAL HISTORY:  Patient is seen, alone for visit, having had cycle 1 carboplatin taxol on 12-06-15 for recurrent ovarian cancer, with carbo skin test and on pro neulasta. Plan is to treat with chemotherapy, then may be able to go to PARP inhibitor.  New PAC functioned well for chemo. 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 She had some nausea, no vomiting, taste has improved now and she was able to push fluids including gatorade. Bowels are moving with soft stools after each meal, no watery diarrhea. She has been holding antihypertensive if BP is lower, as she had problems with hypotension during adjuvant treatment. No SOB walking in office now, no dizziness, is able to sleep, no fever or symptoms of infection. No LE swelling. No bleeding.  Remainder of 10 point Review of Systems negative.   Flu vaccine 11-14-15 CA 125 preoperatively 56, this 5 on 09-14-14.  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  ONCOLOGIC HISTORY Patient had been in usual good health, up to date on all medical care, when she developed bladder symptoms in early 2016. Urine culture showed no infection, then CT AP at [facility] 03-16-14 found perihepatic ascites and pelvic mass 9.9 x 20.1 x 17.8 cm which was cystic and solid, kidneys and ureters unremarkable, no pelvic fluid, rectosigmoid partially compressed, other organs not remarkable, no mention of adenopathy. She was seen by Dr Evie Lacks on referral from Dr Quillian Quince after CT resulted; CT findings confirmed by transvaginal US. CA 125 was 56 pre op. She saw Dr Alycia Rossetti in  consultation on 03-23-14, then had surgery at [facility] on 03-31-14  TAH/BSO, omentectomy, pelvic and paraaortic node evaluation. Surgical findings were of bilateral complex ovarian masses and dense adhesions, with intra-operative spill of right ovarian mass as that was densely adherent to sigmoid serosa; posterior uterine segment also adherent to rectosigmoid, and retroperitoneal fibrosis bilaterally. 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 Cytology from ascites [facility] [phone]) was positive for adenocarcinoma of gyn primary. Multidisciplinary conference at [facility] 04-19-14 recommended carboplatin and taxane chemotherapy. Cycle 1 carbo taxol was given on 05-12-14, with neutropenia by day 11 (ANC 0.8) despite having started granix on day 8.She was hospitalized at [facility] after cycle 3 with syncope related to dehydration. She completed cycle 6 on 09-01-14. PET 08-2014 no clear residual or progressive disease; colonoscopy was unremarkable. CA 125 was 7.5 in 06-2015, then up to 18.4 by 10-2015. CT CAP 11-19-15 showed new progression subcapsular areas of liver, at proximal sigmoid colon and in mesentery, with small ascites. She resumed carbo taxol on 12-06-15, with carbo skin test and on pro neulasta.   Objective:  Vital signs in last 24 hours:  BP 129/66 (BP Location: Left Arm, Patient Position: Sitting)   Pulse 98   Temp 98.4 F (36.9 C) (Oral)   Resp 18   Ht '5\' 5"'  (1.651 m)   Wt 208 lb 4.8 oz (94.5 kg)   SpO2 100%   BMI 34.66 kg/m  Weight down 3 lbs Alert, oriented and appropriate. Ambulatory without difficulty.  No alopecia  HEENT:PERRL, sclerae not icteric. Oral mucosa moist without lesions, posterior pharynx clear.  Neck supple. No JVD.  Lymphatics:no supraclavicular adenopathy Resp: clear to auscultation bilaterally and  normal percussion bilaterally Cardio: regular rate and rhythm. No gallop. GI: soft, nontender, not distended, no mass or organomegaly. Normally active bowel sounds.  Musculoskeletal/ Extremities: without pitting edema, cords, tenderness Neuro: no change peripheral neuropathy feet only. Otherwise nonfocal. PSYCH appropriate mood and affect Skin without rash, ecchymosis, petechiae Portacath-without erythema or tenderness  Lab Results:  Results for orders placed or performed in visit on 12/13/15  CBC with Differential  Result Value Ref Range   WBC 10.0 3.9 - 10.3 10e3/uL   NEUT# 5.8 1.5 - 6.5 10e3/uL   HGB 8.9 (L) 11.6 - 15.9 g/dL   HCT 26.7 (L) 34.8 - 46.6 %   Platelets 232 145 - 400 10e3/uL   MCV 82.3 79.5 - 101.0 fL   MCH 27.3 25.1 - 34.0 pg   MCHC 33.2 31.5 - 36.0 g/dL   RBC 3.25 (L) 3.70 - 5.45 10e6/uL   RDW 14.0 11.2 - 14.5 %   lymph# 2.2 0.9 - 3.3 10e3/uL   MONO# 1.8 (H) 0.1 - 0.9 10e3/uL   Eosinophils Absolute 0.1 0.0 - 0.5 10e3/uL   Basophils Absolute 0.1 0.0 - 0.1 10e3/uL   NEUT% 58.1 38.4 - 76.8 %   LYMPH% 22.0 14.0 - 49.7 %   MONO% 17.9 (H) 0.0 - 14.0 %   EOS% 1.5 0.0 - 7.0 %   BASO% 0.5 0.0 - 2.0 %  Comprehensive metabolic panel  Result Value Ref Range   Sodium 135 (L) 136 - 145 mEq/L   Potassium 4.0 3.5 - 5.1 mEq/L   Chloride 103 98 - 109 mEq/L   CO2 24 22 - 29 mEq/L   Glucose 96 70 - 140 mg/dl   BUN 15.8 7.0 - 26.0 mg/dL   Creatinine 1.1 0.6 - 1.1 mg/dL   Total Bilirubin 0.25 0.20 - 1.20 mg/dL   Alkaline Phosphatase 82 40 - 150 U/L   AST 14 5 - 34 U/L   ALT 19 0 - 55 U/L   Total Protein 6.8 6.4 - 8.3 g/dL   Albumin 3.2 (L) 3.5 - 5.0 g/dL   Calcium 9.0 8.4 - 10.4 mg/dL   Anion Gap 8 3 - 11 mEq/L   EGFR 64 (L) >90 ml/min/1.73 m2    CA 125 on day of cycle 1 11-30-15   32.4 Studies/Results:  No results found.  Medications: I have reviewed the patient's current medications. Should hold antihypertensive for BP <=096 systolic, or higher if  symptoms.  DISCUSSION Interval history reviewed. Fatigue not unexpected, likely in part from lower hemoglobin and expect WBC also will show some drop in next week or so. Hemoglobin not low enough to require PRBCs yet, follow. (she was transfused in hospital 09-2014).  Will see MD with labs 11-24, or can check labs prior if concerns.   Assessment/Plan:  1.Recurrent ovarian cancer with CT documented involvement abdomen and pelvis, out 14 months from completion of adjuvant chemotherapy: w treatment with carboplatin and taxol begun on 12-06-15, with carbo skin testing and on pro neulasta. Counts not yet at nadir, otherwise no particular problems so far. 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 Dense  adhesions and retroperitoneal fibrosis at surgery. Carboplatin and taxol x 6 cycles 05-12-14 thru 09-01-14, with neulasta support. Genetics testing with VUS 04-2014. CT AP8-2016 with areas of concern rectosigmoid and transverse colon, with PET 08-2014 and colonoscopy no malignancy identified. Recent increase in CA 125 marker and CT findings 11-19-15. 2. PAC placed by 12-04-15. 3. 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 Will use taxol for as long as tolerated.  4.benign right breast biopsy at [facility] on 07-25-14 5.previous fulguration of endometriosis, post C section 6.GERD controlled with prescription bid prilosec 7. Colonoscopy ~ 09-2014 by Dr Anthony Sar 8.environmental allergies, zyrtec 9.Advance Directive information given 10.CKD 3 :  At least in part from diuretic previously.Encouraged her to push fluids.Dr Quillian Quince who manages antihypertensives. Carboplatin dosed by AUC   All questions answered and patient knows to call if any concerns prior to next scheduled visit. Time spent 25  min including >50% counseling and coordination of care. Route Dr Quillian Quince, cc Dr Alycia Rossetti   Evlyn Clines, MD   12/13/2015, 6:39 PM

## 2015-12-20 ENCOUNTER — Other Ambulatory Visit: Payer: Self-pay | Admitting: Oncology

## 2015-12-21 ENCOUNTER — Ambulatory Visit: Payer: Managed Care, Other (non HMO)

## 2015-12-21 ENCOUNTER — Ambulatory Visit (HOSPITAL_BASED_OUTPATIENT_CLINIC_OR_DEPARTMENT_OTHER): Payer: Managed Care, Other (non HMO)

## 2015-12-21 ENCOUNTER — Encounter: Payer: Self-pay | Admitting: Oncology

## 2015-12-21 ENCOUNTER — Ambulatory Visit (HOSPITAL_COMMUNITY)
Admission: RE | Admit: 2015-12-21 | Discharge: 2015-12-21 | Disposition: A | Discharge status: Home / Self Care | Payer: Managed Care, Other (non HMO) | Source: Ambulatory Visit | Attending: Oncology | Admitting: Oncology

## 2015-12-21 ENCOUNTER — Other Ambulatory Visit (HOSPITAL_BASED_OUTPATIENT_CLINIC_OR_DEPARTMENT_OTHER): Payer: Managed Care, Other (non HMO)

## 2015-12-21 ENCOUNTER — Ambulatory Visit (HOSPITAL_BASED_OUTPATIENT_CLINIC_OR_DEPARTMENT_OTHER): Payer: Managed Care, Other (non HMO) | Admitting: Oncology

## 2015-12-21 ENCOUNTER — Other Ambulatory Visit: Payer: Self-pay | Admitting: Oncology

## 2015-12-21 VITALS — BP 133/70 | HR 92 | Temp 98.4°F | Resp 17 | Ht 65.0 in | Wt 211.3 lb

## 2015-12-21 DIAGNOSIS — D6481 Anemia due to antineoplastic chemotherapy: Secondary | ICD-10-CM

## 2015-12-21 DIAGNOSIS — C561 Malignant neoplasm of right ovary: Secondary | ICD-10-CM

## 2015-12-21 DIAGNOSIS — T451X5A Adverse effect of antineoplastic and immunosuppressive drugs, initial encounter: Secondary | ICD-10-CM

## 2015-12-21 DIAGNOSIS — C563 Malignant neoplasm of bilateral ovaries: Secondary | ICD-10-CM

## 2015-12-21 DIAGNOSIS — C652 Malignant neoplasm of left renal pelvis: Secondary | ICD-10-CM | POA: Diagnosis not present

## 2015-12-21 DIAGNOSIS — Z95828 Presence of other vascular implants and grafts: Secondary | ICD-10-CM

## 2015-12-21 DIAGNOSIS — C562 Malignant neoplasm of left ovary: Secondary | ICD-10-CM

## 2015-12-21 DIAGNOSIS — D500 Iron deficiency anemia secondary to blood loss (chronic): Secondary | ICD-10-CM

## 2015-12-21 DIAGNOSIS — C775 Secondary and unspecified malignant neoplasm of intrapelvic lymph nodes: Secondary | ICD-10-CM

## 2015-12-21 DIAGNOSIS — G620 Drug-induced polyneuropathy: Secondary | ICD-10-CM

## 2015-12-21 DIAGNOSIS — D701 Agranulocytosis secondary to cancer chemotherapy: Secondary | ICD-10-CM

## 2015-12-21 DIAGNOSIS — N183 Chronic kidney disease, stage 3 (moderate): Secondary | ICD-10-CM

## 2015-12-21 DIAGNOSIS — C569 Malignant neoplasm of unspecified ovary: Secondary | ICD-10-CM

## 2015-12-21 LAB — COMPREHENSIVE METABOLIC PANEL
ALBUMIN: 3 g/dL — AB (ref 3.5–5.0)
ALK PHOS: 69 U/L (ref 40–150)
ALT: 17 U/L (ref 0–55)
AST: 13 U/L (ref 5–34)
Anion Gap: 9 mEq/L (ref 3–11)
BILIRUBIN TOTAL: 0.37 mg/dL (ref 0.20–1.20)
BUN: 14.8 mg/dL (ref 7.0–26.0)
CO2: 22 mEq/L (ref 22–29)
Calcium: 9.1 mg/dL (ref 8.4–10.4)
Chloride: 109 mEq/L (ref 98–109)
Creatinine: 1.2 mg/dL — ABNORMAL HIGH (ref 0.6–1.1)
EGFR: 56 mL/min/{1.73_m2} — AB (ref >90)
GLUCOSE: 103 mg/dL (ref 70–140)
Potassium: 3.7 mEq/L (ref 3.5–5.1)
SODIUM: 140 meq/L (ref 136–145)
TOTAL PROTEIN: 6.7 g/dL (ref 6.4–8.3)

## 2015-12-21 LAB — CBC WITH DIFFERENTIAL/PLATELET
BASO%: 0.7 % (ref 0.0–2.0)
Basophils Absolute: 0 10*3/uL (ref 0.0–0.1)
EOS ABS: 0 10*3/uL (ref 0.0–0.5)
EOS%: 0.6 % (ref 0.0–7.0)
HEMATOCRIT: 22.7 % — AB (ref 34.8–46.6)
HEMOGLOBIN: 7.5 g/dL — AB (ref 11.6–15.9)
LYMPH#: 1.4 10*3/uL (ref 0.9–3.3)
LYMPH%: 23.1 % (ref 14.0–49.7)
MCH: 27.3 pg (ref 25.1–34.0)
MCHC: 33.2 g/dL (ref 31.5–36.0)
MCV: 82.5 fL (ref 79.5–101.0)
MONO#: 0.7 10*3/uL (ref 0.1–0.9)
MONO%: 11.7 % (ref 0.0–14.0)
NEUT%: 63.9 % (ref 38.4–76.8)
NEUTROS ABS: 4 10*3/uL (ref 1.5–6.5)
PLATELETS: 230 10*3/uL (ref 145–400)
RBC: 2.75 10*6/uL — ABNORMAL LOW (ref 3.70–5.45)
RDW: 15 % — AB (ref 11.2–14.5)
WBC: 6.2 10*3/uL (ref 3.9–10.3)

## 2015-12-21 LAB — IRON AND TIBC
%SAT: 15 % — ABNORMAL LOW (ref 21–57)
IRON: 32 ug/dL — AB (ref 41–142)
TIBC: 218 ug/dL — AB (ref 236–444)
UIBC: 186 ug/dL (ref 120–384)

## 2015-12-21 LAB — PREPARE RBC (CROSSMATCH)

## 2015-12-21 MED ORDER — SODIUM CHLORIDE 0.9% FLUSH
10.0000 mL | INTRAVENOUS | Status: AC | PRN
  Administered 2015-12-21: 10 mL
  Filled 2015-12-21: qty 10

## 2015-12-21 MED ORDER — ACETAMINOPHEN 325 MG PO TABS
325.0000 mg | ORAL_TABLET | Freq: Once | ORAL | Status: AC
  Administered 2015-12-21: 325 mg via ORAL

## 2015-12-21 MED ORDER — FLUTICASONE PROPIONATE 50 MCG/ACT NA SUSP: 1.0000 | Freq: Every day | NASAL | 0 refills | Status: AC | PRN

## 2015-12-21 MED ORDER — HEPARIN SOD (PORK) LOCK FLUSH 100 UNIT/ML IV SOLN
500.0000 [IU] | Freq: Once | INTRAVENOUS | Status: AC | PRN
  Administered 2015-12-21: 500 [IU] via INTRAVENOUS
  Filled 2015-12-21: qty 5

## 2015-12-21 MED ORDER — HEPARIN SOD (PORK) LOCK FLUSH 100 UNIT/ML IV SOLN
500.0000 [IU] | Freq: Every day | INTRAVENOUS | Status: AC | PRN
Start: 2015-12-21 — End: 2015-12-21
  Administered 2015-12-21: 500 [IU]
  Filled 2015-12-21: qty 5

## 2015-12-21 MED ORDER — SODIUM CHLORIDE 0.9% FLUSH
10.0000 mL | INTRAVENOUS | Status: DC | PRN
  Administered 2015-12-21: 10 mL via INTRAVENOUS
  Filled 2015-12-21: qty 10

## 2015-12-21 MED ORDER — SODIUM CHLORIDE 0.9 % IV SOLN
250.0000 mL | Freq: Once | INTRAVENOUS | Status: AC
  Administered 2015-12-21: 250 mL via INTRAVENOUS

## 2015-12-21 MED ORDER — ACETAMINOPHEN 325 MG PO TABS
ORAL_TABLET | ORAL | Status: AC
  Filled 2015-12-21: qty 1

## 2015-12-21 NOTE — Patient Instructions (Signed)
Blood Transfusion , Adult A blood transfusion is a procedure in which you receive donated blood, including plasma, platelets, and red blood cells, through an IV tube. You may need a blood transfusion because of illness, surgery, or injury. The blood may come from a donor. You may also be able to donate blood for yourself (autologous blood donation) before a surgery if you know that you might require a blood transfusion. The blood given in a transfusion is made up of different types of cells. You may receive:  Red blood cells. These carry oxygen to the cells in the body.  White blood cells. These help you fight infections.  Platelets. These help your blood to clot.  Plasma. This is the liquid part of your blood and it helps with fluid imbalances. If you have hemophilia or another clotting disorder, you may also receive other types of blood products. Tell a health care provider about:  Any allergies you have.  All medicines you are taking, including vitamins, herbs, eye drops, creams, and over-the-counter medicines.  Any problems you or family members have had with anesthetic medicines.  Any blood disorders you have.  Any surgeries you have had.  Any medical conditions you have, including any recent fever or cold symptoms.  Whether you are pregnant or may be pregnant.  Any previous reactions you have had during a blood transfusion. What are the risks? Generally, this is a safe procedure. However, problems may occur, including:  Having an allergic reaction to something in the donated blood. Hives and itching may be symptoms of this type of reaction.  Fever. This may be a reaction to the white blood cells in the transfused blood. Nausea or chest pain may accompany a fever.  Iron overload. This can happen from having many transfusions.  Transfusion-related acute lung injury (TRALI). This is a rare reaction that causes lung damage. The cause is not known.TRALI can occur within hours  of a transfusion or several days later.  Sudden (acute) or delayed hemolytic reactions. This happens if your blood does not match the cells in your transfusion. Your body's defense system (immune system) may try to attack the new cells. This complication is rare. The symptoms include fever, chills, nausea, and low back pain or chest pain.  Infection or disease transmission. This is rare. What happens before the procedure?  You will have a blood test to determine your blood type. This is necessary to know what kind of blood your body will accept and to match it to the donor blood.  If you are going to have a planned surgery, you may be able to do an autologous blood donation. This may be done in case you need to have a transfusion.  If you have had an allergic reaction to a transfusion in the past, you may be given medicine to help prevent a reaction. This medicine may be given to you by mouth or through an IV tube.  You will have your temperature, blood pressure, and pulse monitored before the transfusion.  Follow instructions from your health care provider about eating and drinking restrictions.  Ask your health care provider about:  Changing or stopping your regular medicines. This is especially important if you are taking diabetes medicines or blood thinners.  Taking medicines such as aspirin and ibuprofen. These medicines can thin your blood. Do not take these medicines before your procedure if your health care provider instructs you not to. What happens during the procedure?  An IV tube will be   inserted into one of your veins.  The bag of donated blood will be attached to your IV tube. The blood will then enter through your vein.  Your temperature, blood pressure, and pulse will be monitored regularly during the transfusion. This monitoring is done to detect early signs of a transfusion reaction.  If you have any signs or symptoms of a reaction, your transfusion will be stopped and  you may be given medicine.  When the transfusion is complete, your IV tube will be removed.  Pressure may be applied to the IV site for a few minutes.  A bandage (dressing) will be applied. The procedure may vary among health care providers and hospitals. What happens after the procedure?  Your temperature, blood pressure, heart rate, breathing rate, and blood oxygen level will be monitored often.  Your blood may be tested to see how you are responding to the transfusion.  You may be warmed with fluids or blankets to maintain a normal body temperature. Summary  A blood transfusion is a procedure in which you receive donated blood, including plasma, platelets, and red blood cells, through an IV tube.  Your temperature, blood pressure, and pulse will be monitored before, during, and after the transfusion.  Your blood may be tested after the transfusion to see how your body has responded. This information is not intended to replace advice given to you by your health care provider. Make sure you discuss any questions you have with your health care provider. Document Released: 01/11/2000 Document Revised: 10/11/2015 Document Reviewed: 10/11/2015 Elsevier Interactive Patient Education  2017 [employer]  

## 2015-12-21 NOTE — Progress Notes (Signed)
OFFICE PROGRESS NOTE   December 21, 2015   Physicians: Barron Alvine, Mitzie Na, MD (PCP), Alden Hipp), Gari [facility])  INTERVAL HISTORY:  Patient is seen, alone for visit, in follow up of chemotherapy begun for recurrent ovarian cancer, cycle 1 carbo taxol given 12-06-15 with on pro neulasta, carbo dosed at AUC = 4 due to cytopenias with adjuvant treatment.. Plan chemotherapy then consider PARP inhibitor.  Patient has been more fatigued with exertion, had to sit down to rest while cooking for Thanksgiving and noticeable walking in office today. She denies chest pain or SOB sitting. She cannot tell any worsening of peripheral neuropathy, or possibly just slightly more, the neuropathy in feet since adjuvant chemotherapy. She denies nausea now, is eating, no pain, bowels ok, no bleeding, no problems with PAC, no fever or symptoms of infection. She has held BP medications as discussed. Remainder of 10 point Review of Systems negative.   Flu vaccine 11-14-15 CA 125 preoperatively 56, this 5 on 09-14-14.  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  Daughter is 4th year med Ship broker, home for Thanksgiving break.  ONCOLOGIC HISTORY Patient had been in usual good health, up to date on all medical care, when she developed bladder symptoms in early 2016. Urine culture showed no infection, then CT AP at [facility] 03-16-14 found perihepatic ascites and pelvic mass 9.9 x 20.1 x 17.8 cm which was cystic and solid, kidneys and ureters unremarkable, no pelvic fluid, rectosigmoid partially compressed, other organs not remarkable, no mention of adenopathy. She was seen by Dr Evie Lacks on referral from Dr Quillian Quince after CT resulted; CT findings confirmed by transvaginal US. CA 125 was 56 pre op. She saw Dr Alycia Rossetti in consultation on 03-23-14, then had surgery at [facility] on 03-31-14   TAH/BSO, omentectomy, pelvic and paraaortic node evaluation. Surgical findings were of bilateral complex ovarian masses and dense adhesions, with intra-operative spill of right ovarian mass as that was densely adherent to sigmoid serosa; posterior uterine segment also adherent to rectosigmoid, and retroperitoneal fibrosis bilaterally. 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 Cytology from ascites [facility] [phone]) was positive for adenocarcinoma of gyn primary. Multidisciplinary conference at [facility] 04-19-14 recommended carboplatin and taxane chemotherapy. Cycle 1 carbo taxol was given on 05-12-14, with neutropenia by day 11 (ANC 0.8) despite having started granix on day 8.She was hospitalized at [facility] after cycle 3 with syncope related to dehydration. She completed cycle 6 on 09-01-14. PET 08-2014 no clear residual or progressive disease; colonoscopy was unremarkable. CA 125 was 7.5 in 06-2015, then up to 18.4 by 10-2015. CT CAP 11-19-15 showed new progression subcapsular areas of liver, at proximal sigmoid colon and in mesentery, with small ascites. She began chemotherapy for the recurrence with carbo taxol on 12-06-15, with on pro neulasta. She had 2 units PRBCs for hemoglobin down to 7.5 and symptomatic on 12-21-15.   Objective:  Vital signs in last 24 hours:  BP 133/70 (BP Location: Left Arm, Patient Position: Sitting)   Pulse 92   Temp 98.4 F (36.9 C) (Oral)   Resp 17   Ht '5\' 5"'  (1.651 m)   Wt 211 lb 4.8 oz (95.8 kg)   SpO2 100%   BMI 35.16 kg/m  Weight up 3 lbs Alert, oriented and appropriate. Ambulatory without assistance.  No alopecia  HEENT:PERRL, sclerae not icteric. Oral mucosa moist without lesions, posterior pharynx clear.  Neck supple. No JVD.  Lymphatics:no cervical,supraclavicular adenopathy Resp:  clear to auscultation bilaterally and normal percussion bilaterally Cardio: regular rate and rhythm, 90 seated. No gallop. GI: soft, nontender, not distended, no mass or organomegaly. Normally active bowel sounds.  Musculoskeletal/ Extremities:LE  without pitting edema, cords, tenderness Neuro: peripheral neuropathy stable in feet. Otherwise nonfocal. PSYCH appropriate mood and affect Skin without rash, ecchymosis, petechiae Breasts: without dominant mass, skin or nipple findings. Axillae benign. Portacath-without erythema or tenderness  Lab Results:  Results for orders placed or performed in visit on 12/21/15  CBC with Differential  Result Value Ref Range   WBC 6.2 3.9 - 10.3 10e3/uL   NEUT# 4.0 1.5 - 6.5 10e3/uL   HGB 7.5 (L) 11.6 - 15.9 g/dL   HCT 22.7 (L) 34.8 - 46.6 %   Platelets 230 145 - 400 10e3/uL   MCV 82.5 79.5 - 101.0 fL   MCH 27.3 25.1 - 34.0 pg   MCHC 33.2 31.5 - 36.0 g/dL   RBC 2.75 (L) 3.70 - 5.45 10e6/uL   RDW 15.0 (H) 11.2 - 14.5 %   lymph# 1.4 0.9 - 3.3 10e3/uL   MONO# 0.7 0.1 - 0.9 10e3/uL   Eosinophils Absolute 0.0 0.0 - 0.5 10e3/uL   Basophils Absolute 0.0 0.0 - 0.1 10e3/uL   NEUT% 63.9 38.4 - 76.8 %   LYMPH% 23.1 14.0 - 49.7 %   MONO% 11.7 0.0 - 14.0 %   EOS% 0.6 0.0 - 7.0 %   BASO% 0.7 0.0 - 2.0 %   CA 125 available after visit 48, this having been 32 on 11-3 and 18 on 11-05-15. First chemo given 11-9.   Iron studies today serumm iron 32 and %sat 15  Studies/Results:  No results found.  Medications: I have reviewed the patient's current medications. She requests flonase by prescription, uses prn for environmental allergies, otherwise to be filled by Dr Quillian Quince  DISCUSSION Symptomatic anemia in patient who had difficulty with cytopenias also with adjuvant chemotherapy. She agrees to PRBC transfusion, able to work in 2 units today at [facility].  Iron added to blood already drawn today, as above  Patient is in agreement with continuing treatment as  planned, due cycle 2 on 12-28-15 as long as [place] >=1.5 and plt >=100k   Assessment/Plan:  1.Recurrent ovarian cancer with CT documented involvement abdomen and pelvis out 14 months from completion of adjuvant chemotherapy: Cycle 1 carboplatin and taxol given 12-06-15, with carbo skin testing and on pro neulasta. Due cycle 2 on 12-28-15. Cytopenias and peripheral neuropathy likely to be limiting for this chemo at some point.   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 Dense adhesions and retroperitoneal fibrosis at surgery. Carboplatin and taxol x 6 cycles 05-12-14 thru 09-01-14, with neulasta support. Genetics testing with VUS 04-2014. CT AP8-2016 with areas of concern rectosigmoid and transverse colon, with PET 08-2014 and colonoscopy no malignancy identified. 2. Chemo and iron deficiency anemia: symptomatic, transfuse 2 units PRBCs today. Continue oral iron as possible. May need IV iron. 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 Will use taxol for as long as tolerated.  4.benign right breast biopsy at [facility] on 07-25-14 5.previous fulguration of endometriosis, post C section 6.GERD controlled with prescription bid prilosec 7. Colonoscopy ~ 09-2014 by Dr Anthony Sar 8.environmental allergies, zyrtec and flonase prn. 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 Cc Dr Quillian Quince who manages antihypertensives. Carboplatin dosed by AUC  11.Peripheral IV access not adequate for treatment. PAC by IR at [facility] 12-04-15.11.  12.history of HTN, but also hypotensive/ orthostatic during adjuvant chemo. Push fluids po, follow home BP, hold meds prn.  All questions answered and she knows to call if any concerns between scheduled  appointments. Transfusion orders done. Chemo and neulasta orders confirmed for cycle 2, keep carbo AUC = 4 and taxol 135 mg/m2. Time spent 25 min including >50% counseling and coordination of care. Route Dr Quillian Quince, cc Dr Alycia Rossetti  Evlyn Clines, MD   12/21/2015, 9:03 AM

## 2015-12-22 DIAGNOSIS — D500 Iron deficiency anemia secondary to blood loss (chronic): Secondary | ICD-10-CM | POA: Insufficient documentation

## 2015-12-22 LAB — CA 125: CANCER ANTIGEN (CA) 125: 48.4 U/mL — AB (ref 0.0–38.1)

## 2015-12-24 ENCOUNTER — Other Ambulatory Visit: Payer: Self-pay

## 2015-12-24 ENCOUNTER — Ambulatory Visit: Payer: Self-pay | Admitting: Oncology

## 2015-12-24 DIAGNOSIS — D6481 Anemia due to antineoplastic chemotherapy: Secondary | ICD-10-CM | POA: Diagnosis not present

## 2015-12-25 LAB — TYPE AND SCREEN
ABO/RH(D): B POS
ANTIBODY SCREEN: NEGATIVE
Unit division: 0
Unit division: 0

## 2015-12-28 ENCOUNTER — Other Ambulatory Visit (HOSPITAL_BASED_OUTPATIENT_CLINIC_OR_DEPARTMENT_OTHER): Payer: Managed Care, Other (non HMO)

## 2015-12-28 ENCOUNTER — Ambulatory Visit (HOSPITAL_BASED_OUTPATIENT_CLINIC_OR_DEPARTMENT_OTHER): Payer: Managed Care, Other (non HMO)

## 2015-12-28 VITALS — BP 127/69 | HR 85 | Temp 98.1°F | Resp 18

## 2015-12-28 DIAGNOSIS — C562 Malignant neoplasm of left ovary: Secondary | ICD-10-CM | POA: Diagnosis not present

## 2015-12-28 DIAGNOSIS — C561 Malignant neoplasm of right ovary: Secondary | ICD-10-CM

## 2015-12-28 DIAGNOSIS — D6481 Anemia due to antineoplastic chemotherapy: Secondary | ICD-10-CM

## 2015-12-28 DIAGNOSIS — Z5111 Encounter for antineoplastic chemotherapy: Secondary | ICD-10-CM | POA: Diagnosis not present

## 2015-12-28 DIAGNOSIS — C569 Malignant neoplasm of unspecified ovary: Secondary | ICD-10-CM

## 2015-12-28 DIAGNOSIS — C563 Malignant neoplasm of bilateral ovaries: Secondary | ICD-10-CM

## 2015-12-28 LAB — CBC WITH DIFFERENTIAL/PLATELET
BASO%: 0.5 % (ref 0.0–2.0)
Basophils Absolute: 0 10*3/uL (ref 0.0–0.1)
EOS ABS: 0 10*3/uL (ref 0.0–0.5)
EOS%: 0 % (ref 0.0–7.0)
HCT: 33.3 % — ABNORMAL LOW (ref 34.8–46.6)
HEMOGLOBIN: 11.2 g/dL — AB (ref 11.6–15.9)
LYMPH%: 7.4 % — ABNORMAL LOW (ref 14.0–49.7)
MCH: 28.4 pg (ref 25.1–34.0)
MCHC: 33.6 g/dL (ref 31.5–36.0)
MCV: 84.6 fL (ref 79.5–101.0)
MONO#: 0 10*3/uL — ABNORMAL LOW (ref 0.1–0.9)
MONO%: 0.5 % (ref 0.0–14.0)
NEUT%: 91.6 % — ABNORMAL HIGH (ref 38.4–76.8)
NEUTROS ABS: 6.5 10*3/uL (ref 1.5–6.5)
Platelets: 337 10*3/uL (ref 145–400)
RBC: 3.93 10*6/uL (ref 3.70–5.45)
RDW: 15.6 % — AB (ref 11.2–14.5)
WBC: 7.1 10*3/uL (ref 3.9–10.3)
lymph#: 0.5 10*3/uL — ABNORMAL LOW (ref 0.9–3.3)

## 2015-12-28 LAB — COMPREHENSIVE METABOLIC PANEL
ALBUMIN: 3.1 g/dL — AB (ref 3.5–5.0)
ALK PHOS: 69 U/L (ref 40–150)
ALT: 16 U/L (ref 0–55)
AST: 11 U/L (ref 5–34)
Anion Gap: 10 mEq/L (ref 3–11)
BILIRUBIN TOTAL: 0.34 mg/dL (ref 0.20–1.20)
BUN: 14.7 mg/dL (ref 7.0–26.0)
CALCIUM: 9.6 mg/dL (ref 8.4–10.4)
CO2: 22 mEq/L (ref 22–29)
Chloride: 106 mEq/L (ref 98–109)
Creatinine: 1.1 mg/dL (ref 0.6–1.1)
EGFR: 62 mL/min/{1.73_m2} — AB (ref >90)
Glucose: 168 mg/dl — ABNORMAL HIGH (ref 70–140)
POTASSIUM: 4.2 meq/L (ref 3.5–5.1)
Sodium: 138 mEq/L (ref 136–145)
Total Protein: 7.4 g/dL (ref 6.4–8.3)

## 2015-12-28 MED ORDER — SODIUM CHLORIDE 0.9 % IV SOLN
Freq: Once | INTRAVENOUS | Status: AC
  Administered 2015-12-28: 14:00:00 via INTRAVENOUS

## 2015-12-28 MED ORDER — ONDANSETRON HCL 8 MG PO TABS
8.0000 mg | ORAL_TABLET | Freq: Once | ORAL | Status: AC
  Administered 2015-12-28: 8 mg via ORAL

## 2015-12-28 MED ORDER — SODIUM CHLORIDE 0.9 % IV SOLN
Freq: Once | INTRAVENOUS | Status: AC
  Administered 2015-12-28: 14:00:00 via INTRAVENOUS
  Filled 2015-12-28: qty 5

## 2015-12-28 MED ORDER — SODIUM CHLORIDE 0.9 % IV SOLN
135.0000 mg/m2 | Freq: Once | INTRAVENOUS | Status: AC
  Administered 2015-12-28: 282 mg via INTRAVENOUS
  Filled 2015-12-28: qty 47

## 2015-12-28 MED ORDER — PEGFILGRASTIM 6 MG/0.6ML ~~LOC~~ PSKT
6.0000 mg | PREFILLED_SYRINGE | Freq: Once | SUBCUTANEOUS | Status: AC
  Administered 2015-12-28: 6 mg via SUBCUTANEOUS
  Filled 2015-12-28: qty 0.6

## 2015-12-28 MED ORDER — ONDANSETRON HCL 8 MG PO TABS
ORAL_TABLET | ORAL | Status: AC
  Filled 2015-12-28: qty 1

## 2015-12-28 MED ORDER — DIPHENHYDRAMINE HCL 50 MG/ML IJ SOLN
25.0000 mg | Freq: Once | INTRAMUSCULAR | Status: AC
  Administered 2015-12-28: 25 mg via INTRAVENOUS

## 2015-12-28 MED ORDER — HEPARIN SOD (PORK) LOCK FLUSH 100 UNIT/ML IV SOLN
500.0000 [IU] | Freq: Once | INTRAVENOUS | Status: AC | PRN
  Administered 2015-12-28: 500 [IU]
  Filled 2015-12-28: qty 5

## 2015-12-28 MED ORDER — DIPHENHYDRAMINE HCL 50 MG/ML IJ SOLN
INTRAMUSCULAR | Status: AC
  Filled 2015-12-28: qty 1

## 2015-12-28 MED ORDER — CARBOPLATIN CHEMO INTRADERMAL TEST DOSE 100MCG/0.02ML
100.0000 ug | Freq: Once | INTRADERMAL | Status: AC
  Administered 2015-12-28: 100 ug via INTRADERMAL
  Filled 2015-12-28: qty 0.02

## 2015-12-28 MED ORDER — FAMOTIDINE IN NACL 20-0.9 MG/50ML-% IV SOLN
INTRAVENOUS | Status: AC
  Filled 2015-12-28: qty 50

## 2015-12-28 MED ORDER — SODIUM CHLORIDE 0.9% FLUSH
10.0000 mL | INTRAVENOUS | Status: DC | PRN
  Administered 2015-12-28: 10 mL
  Filled 2015-12-28: qty 10

## 2015-12-28 MED ORDER — SODIUM CHLORIDE 0.9 % IV SOLN
450.0000 mg | Freq: Once | INTRAVENOUS | Status: AC
  Administered 2015-12-28: 450 mg via INTRAVENOUS
  Filled 2015-12-28: qty 45

## 2015-12-28 MED ORDER — FAMOTIDINE IN NACL 20-0.9 MG/50ML-% IV SOLN
20.0000 mg | Freq: Once | INTRAVENOUS | Status: AC
  Administered 2015-12-28: 20 mg via INTRAVENOUS

## 2015-12-28 NOTE — Patient Instructions (Signed)
[facility] Discharge Instructions for Patients Receiving Chemotherapy  Today you received the following chemotherapy agents Taxol and Carboplatin  To help prevent nausea and vomiting after your treatment, we encourage you to take your nausea medication as direction.   If you develop nausea and vomiting that is not controlled by your nausea medication, call the clinic.   BELOW ARE SYMPTOMS THAT SHOULD BE REPORTED IMMEDIATELY:  *FEVER GREATER THAN 100.5 F  *CHILLS WITH OR WITHOUT FEVER  NAUSEA AND VOMITING THAT IS NOT CONTROLLED WITH YOUR NAUSEA MEDICATION  *UNUSUAL SHORTNESS OF BREATH  *UNUSUAL BRUISING OR BLEEDING  TENDERNESS IN MOUTH AND THROAT WITH OR WITHOUT PRESENCE OF ULCERS  *URINARY PROBLEMS  *BOWEL PROBLEMS  UNUSUAL RASH Items with * indicate a potential emergency and should be followed up as soon as possible.  Feel free to call the clinic you have any questions or concerns. The clinic phone number is [phone].  Please show the [facility] at check-in to the Emergency Department and triage nurse.

## 2016-01-06 ENCOUNTER — Other Ambulatory Visit: Payer: Self-pay | Admitting: Oncology

## 2016-01-06 DIAGNOSIS — C569 Malignant neoplasm of unspecified ovary: Secondary | ICD-10-CM

## 2016-01-07 ENCOUNTER — Ambulatory Visit (HOSPITAL_BASED_OUTPATIENT_CLINIC_OR_DEPARTMENT_OTHER): Payer: Managed Care, Other (non HMO)

## 2016-01-07 ENCOUNTER — Other Ambulatory Visit (HOSPITAL_BASED_OUTPATIENT_CLINIC_OR_DEPARTMENT_OTHER): Payer: Managed Care, Other (non HMO)

## 2016-01-07 ENCOUNTER — Ambulatory Visit (HOSPITAL_COMMUNITY)
Admission: RE | Admit: 2016-01-07 | Discharge: 2016-01-07 | Disposition: A | Discharge status: Home / Self Care | Payer: Managed Care, Other (non HMO) | Source: Ambulatory Visit | Attending: Oncology | Admitting: Oncology

## 2016-01-07 ENCOUNTER — Ambulatory Visit (HOSPITAL_BASED_OUTPATIENT_CLINIC_OR_DEPARTMENT_OTHER): Payer: Managed Care, Other (non HMO) | Admitting: Oncology

## 2016-01-07 VITALS — BP 133/81 | HR 108 | Temp 97.5°F | Resp 18 | Ht 65.0 in | Wt 208.0 lb

## 2016-01-07 DIAGNOSIS — C569 Malignant neoplasm of unspecified ovary: Secondary | ICD-10-CM | POA: Diagnosis present

## 2016-01-07 DIAGNOSIS — C562 Malignant neoplasm of left ovary: Secondary | ICD-10-CM | POA: Diagnosis not present

## 2016-01-07 DIAGNOSIS — G620 Drug-induced polyneuropathy: Secondary | ICD-10-CM

## 2016-01-07 DIAGNOSIS — D500 Iron deficiency anemia secondary to blood loss (chronic): Secondary | ICD-10-CM | POA: Diagnosis not present

## 2016-01-07 DIAGNOSIS — C561 Malignant neoplasm of right ovary: Secondary | ICD-10-CM

## 2016-01-07 DIAGNOSIS — N183 Chronic kidney disease, stage 3 unspecified: Secondary | ICD-10-CM

## 2016-01-07 DIAGNOSIS — Z95828 Presence of other vascular implants and grafts: Secondary | ICD-10-CM

## 2016-01-07 DIAGNOSIS — T451X5A Adverse effect of antineoplastic and immunosuppressive drugs, initial encounter: Secondary | ICD-10-CM

## 2016-01-07 DIAGNOSIS — C563 Malignant neoplasm of bilateral ovaries: Secondary | ICD-10-CM

## 2016-01-07 DIAGNOSIS — D701 Agranulocytosis secondary to cancer chemotherapy: Secondary | ICD-10-CM

## 2016-01-07 LAB — CBC WITH DIFFERENTIAL/PLATELET
BASO%: 0.1 % (ref 0.0–2.0)
BASOS ABS: 0 10*3/uL (ref 0.0–0.1)
EOS%: 0.2 % (ref 0.0–7.0)
Eosinophils Absolute: 0 10*3/uL (ref 0.0–0.5)
HCT: 28.8 % — ABNORMAL LOW (ref 34.8–46.6)
HGB: 9.8 g/dL — ABNORMAL LOW (ref 11.6–15.9)
LYMPH%: 16.1 % (ref 14.0–49.7)
MCH: 28.2 pg (ref 25.1–34.0)
MCHC: 34 g/dL (ref 31.5–36.0)
MCV: 83 fL (ref 79.5–101.0)
MONO#: 1 10*3/uL — ABNORMAL HIGH (ref 0.1–0.9)
MONO%: 7.7 % (ref 0.0–14.0)
NEUT#: 9.4 10*3/uL — ABNORMAL HIGH (ref 1.5–6.5)
NEUT%: 75.9 % (ref 38.4–76.8)
Platelets: 159 10*3/uL (ref 145–400)
RBC: 3.47 10*6/uL — AB (ref 3.70–5.45)
RDW: 15.3 % — ABNORMAL HIGH (ref 11.2–14.5)
WBC: 12.4 10*3/uL — ABNORMAL HIGH (ref 3.9–10.3)
lymph#: 2 10*3/uL (ref 0.9–3.3)

## 2016-01-07 LAB — COMPREHENSIVE METABOLIC PANEL
ALT: 17 U/L (ref 0–55)
AST: 15 U/L (ref 5–34)
Albumin: 3.2 g/dL — ABNORMAL LOW (ref 3.5–5.0)
Alkaline Phosphatase: 96 U/L (ref 40–150)
Anion Gap: 11 mEq/L (ref 3–11)
BUN: 11.5 mg/dL (ref 7.0–26.0)
CHLORIDE: 107 meq/L (ref 98–109)
CO2: 23 meq/L (ref 22–29)
Calcium: 8.9 mg/dL (ref 8.4–10.4)
Creatinine: 1.1 mg/dL (ref 0.6–1.1)
EGFR: 62 mL/min/{1.73_m2} — ABNORMAL LOW (ref >90)
GLUCOSE: 117 mg/dL (ref 70–140)
POTASSIUM: 3.7 meq/L (ref 3.5–5.1)
SODIUM: 141 meq/L (ref 136–145)
Total Bilirubin: 0.35 mg/dL (ref 0.20–1.20)
Total Protein: 7.1 g/dL (ref 6.4–8.3)

## 2016-01-07 MED ORDER — SODIUM CHLORIDE 0.9% FLUSH
10.0000 mL | INTRAVENOUS | Status: DC | PRN
  Administered 2016-01-07: 10 mL via INTRAVENOUS
  Filled 2016-01-07: qty 10

## 2016-01-07 MED ORDER — HEPARIN SOD (PORK) LOCK FLUSH 100 UNIT/ML IV SOLN
500.0000 [IU] | Freq: Once | INTRAVENOUS | Status: AC | PRN
  Administered 2016-01-07: 500 [IU] via INTRAVENOUS
  Filled 2016-01-07: qty 5

## 2016-01-07 NOTE — Progress Notes (Signed)
OFFICE PROGRESS NOTE   January 08, 2016   Physicians: Barron Alvine, Mitzie Na, MD (PCP), Alden Hipp), Gari Crown [facility])  INTERVAL HISTORY:  Patient is seen, alone for visit, in continuing attention to recurrent ovarian cancer, having had cycle 2 carbo taxol on 12-28-15, with on pro neulasta. She was also transfused 2 units PRBCs on 12-24-15 for hgb down to 7.5 Most recent imaging was CT AP 11-19-15 (prior to first carbo taxol on 12-06-15), with only small ascites then. CA 125 baseline for this chemo was 32 on 11-30-15, and was 48 on 12-21-15 after one cycle.    No energy, even after 2 units PRBCs on 12-24-15, has to sit down while cooking. Abdomen feels bloated and full, notices some discomfort in upper quadrants and epigastrium occasionally. Bowels move after she eats, ~ 3x daily, may not be a large amount, but has seemed adequate to patient. Nausea better cycle 2 using regular doses of zofran x 5 days and some ativan; she has not needed antiemetics in past week. No vomiting. Appetite not so good, has to make herself eat, prefers crackers, soup, jello, fruit. No SOB with walking into office. No bleeding. Mild aching from taxol and neulasta. No problems with PAC. No fever or symptoms of infection. Bladder ok. No LE swelling. No peripheral neuropathy in hands, feet still numbness in toes unchanged. Continues to work.  Remainder of 10 point Review of Systems negative.   Flu vaccine 11-14-15 CA 125 preoperatively 56, this 5 on 09-14-14. 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   ONCOLOGIC HISTORY Patient had been in usual good health, up to date on all medical care, when she developed bladder symptoms in early 2016. Urine culture showed no infection, then CT AP at [facility] 03-16-14 found perihepatic ascites and pelvic mass 9.9 x 20.1 x 17.8 cm which was  cystic and solid, kidneys and ureters unremarkable, no pelvic fluid, rectosigmoid partially compressed, other organs not remarkable, no mention of adenopathy. She was seen by Dr Evie Lacks on referral from Dr Quillian Quince after CT resulted; CT findings confirmed by transvaginal US. CA 125 was 56 pre op. She saw Dr Alycia Rossetti in consultation on 03-23-14, then had surgery at [facility] on 03-31-14 TAH/BSO, omentectomy, pelvic and paraaortic node evaluation. Surgical findings were of bilateral complex ovarian masses and dense adhesions, with intra-operative spill of right ovarian mass as that was densely adherent to sigmoid serosa; posterior uterine segment also adherent to rectosigmoid, and retroperitoneal fibrosis bilaterally. 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 Cytology from ascites [facility] [phone]) was positive for adenocarcinoma of gyn primary. Multidisciplinary conference at [facility] 04-19-14 recommended carboplatin and taxane chemotherapy. Cycle 1 carbo taxol was given on 05-12-14, with neutropenia by day 11 (ANC 0.8) despite having started granix on day 8.She was hospitalized at [facility] after cycle 3 with syncope related to dehydration. She completed cycle 6 on 09-01-14. PET 08-2014 no clear residual or progressive disease; colonoscopy was unremarkable. CA 125 was 7.5 in 06-2015, then up to 18.4 by 10-2015. CT CAP 11-19-15 showed new progression subcapsular areas of liver, at proximal sigmoid colon and in mesentery, with small ascites. She began chemotherapy for the recurrence with carbo taxol on 12-06-15, with on pro neulasta. She had  2 units PRBCs for hemoglobin down to 7.5 and symptomatic on 12-21-15.   Objective:  Vital signs in last 24 hours:  BP 133/81 (BP Location: Left Arm, Patient Position: Sitting)   Pulse (!) 108   Temp 97.5 F (36.4 C)  (Oral)   Resp 18   Ht '5\' 5"'  (1.651 m)   Wt 208 lb (94.3 kg)   SpO2 100%   BMI 34.61 kg/m  Weight down 3 lbs. Alert, oriented and appropriate. Ambulatory withoutdifficulty.  Alopecia  HEENT:PERRL, sclerae not icteric. Oral mucosa moist without lesions, posterior pharynx clear.  Neck supple. No JVD.  Lymphatics:no cervical,supraclavicular adenopathy Resp: clear to auscultation bilaterally and normal percussion bilaterally Cardio: regular rate and rhythm. No gallop. GI: soft, nontender, mildly distended, not tight,  with soft protuberance at umbilicus, no clear mass or organomegaly. Some bowel sounds. No fluid wave obvious. Musculoskeletal/ Extremities: LE without pitting edema, cords, tenderness Neuro: no peripheral neuropathy hand, stable as noted in feet. Otherwise nonfocal Skin without rash, ecchymosis, petechiae Portacath-without erythema or tenderness  Lab Results:  Results for orders placed or performed in visit on 01/07/16  CBC with Differential  Result Value Ref Range   WBC 12.4 (H) 3.9 - 10.3 10e3/uL   NEUT# 9.4 (H) 1.5 - 6.5 10e3/uL   HGB 9.8 (L) 11.6 - 15.9 g/dL   HCT 28.8 (L) 34.8 - 46.6 %   Platelets 159 145 - 400 10e3/uL   MCV 83.0 79.5 - 101.0 fL   MCH 28.2 25.1 - 34.0 pg   MCHC 34.0 31.5 - 36.0 g/dL   RBC 3.47 (L) 3.70 - 5.45 10e6/uL   RDW 15.3 (H) 11.2 - 14.5 %   lymph# 2.0 0.9 - 3.3 10e3/uL   MONO# 1.0 (H) 0.1 - 0.9 10e3/uL   Eosinophils Absolute 0.0 0.0 - 0.5 10e3/uL   Basophils Absolute 0.0 0.0 - 0.1 10e3/uL   NEUT% 75.9 38.4 - 76.8 %   LYMPH% 16.1 14.0 - 49.7 %   MONO% 7.7 0.0 - 14.0 %   EOS% 0.2 0.0 - 7.0 %   BASO% 0.1 0.0 - 2.0 %  Comprehensive metabolic panel  Result Value Ref Range   Sodium 141 136 - 145 mEq/L   Potassium 3.7 3.5 - 5.1 mEq/L   Chloride 107 98 - 109 mEq/L   CO2 23 22 - 29 mEq/L   Glucose 117 70 - 140 mg/dl   BUN 11.5 7.0 - 26.0 mg/dL   Creatinine 1.1 0.6 - 1.1 mg/dL   Total Bilirubin 0.35 0.20 - 1.20 mg/dL   Alkaline  Phosphatase 96 40 - 150 U/L   AST 15 5 - 34 U/L   ALT 17 0 - 55 U/L   Total Protein 7.1 6.4 - 8.3 g/dL   Albumin 3.2 (L) 3.5 - 5.0 g/dL   Calcium 8.9 8.4 - 10.4 mg/dL   Anion Gap 11 3 - 11 mEq/L   EGFR 62 (L) >90 ml/min/1.73 m2   Iron studies 12-21-15  Serum iron 32 and %sat 15  Studies/Results: Patient sent from visit for abd Xray: Dg Abd 1 View  Result Date: 01/07/2016 CLINICAL DATA:  Ovarian carcinoma and diarrhea EXAM: ABDOMEN - 1 VIEW COMPARISON:  11/19/2015 FINDINGS: Scattered large and small bowel gas is noted. No abnormal mass or abnormal calcifications are seen. Bony structures are within normal limits. IMPRESSION: No acute abnormality noted. Electronically Signed   By: Inez Catalina M.D.   On: 01/07/2016 13:48   PACs images reviewed by MD  Medications:  I have reviewed the patient's current medications.  DISCUSSION Difficult to tell by history and exam that anything is better after 2 cycles of carbo taxol. She is not tolerating treatment very well, including neuropathy concerns. Xray after visit does not show obvious marked constipation to account for bloating and discomfort. Will let patient know this and will try to get CT AP prior to MD visit on 01-14-16. I did tell her at visit that generally we try to wait to reimage until after 3 cycles as long as clinically stable or improving, however concerns now, and is due cycle 3 on 01-24-16.   She is not obstructed clinically.   Discussed nutrition, encouraged small amounts of healthy food frequently.  Fine to use antiemetics regularly for several days after chemo, then prn.  After visit MD also noted low iron from recent labs. IV iron as feraheme may be helpful. Message to RN, orders placed for ~ 12-15 and 12-22 (fine to move dates if needed), message to managed care for preauth  Assessment/Plan:  1.Recurrent ovarian cancer 14 months from completion of adjuvant chemotherapy for IIIA1 high grade clear cell of bilateral ovaries:  Cycle 2 carboplatin and taxol given 12-28-15 with carbo skin testing and on pro neulasta. Should be platinum sensitive given time to recurrence, however concerns as above, clinically cannot tell improvement from 2 cycles as yet, tho difficult to sort out from side effects of chemo. As plain xray not revealing, will request CT AP prior to next treatment: if obviously progressing will need to change, but if stable or some improvement will continue with cycle 3 as planned.  2. Chemo and iron deficiency anemia: transfused 2 units PRBCs 12-24-15.  Does not tolerate oral iron easily, will recommend IV iron as feraheme.  3. 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 Colonoscopy ~ 09-2014 by Dr Anthony Sar 8.environmental allergies, zyrtec and flonase prn. 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  Dr Quillian Quince manages antihypertensives. Carboplatin dosed by AUC  11.Peripheral IV access not adequate for treatment. PAC by IR at [facility] 12-04-15  12.history of HTN, but also hypotensive/ orthostatic during adjuvant chemo. Push fluids po, follow home BP, hold meds prn.  All questions answered. We will let her know abd xray results, recommendation for CT now, recommendation for IV iron. I will see her with lab including CA 125 on 01-14-16. Messages to managed care for preauth CT and feraheme. Message to collaborative RN. Orders for CT, feraheme (for 12-15 and 12-22, tho dates may need to change). Time spent 40 min including >50% counseling and coordination of care. Cc Drs Alycia Rossetti and Nunzio Cory, MD   01/08/2016, 10:25 AM

## 2016-01-08 ENCOUNTER — Encounter: Payer: Self-pay | Admitting: Oncology

## 2016-01-08 ENCOUNTER — Other Ambulatory Visit: Payer: Self-pay | Admitting: Oncology

## 2016-01-08 ENCOUNTER — Telehealth: Payer: Self-pay

## 2016-01-08 DIAGNOSIS — C563 Malignant neoplasm of bilateral ovaries: Secondary | ICD-10-CM

## 2016-01-08 DIAGNOSIS — C562 Malignant neoplasm of left ovary: Principal | ICD-10-CM

## 2016-01-08 DIAGNOSIS — C561 Malignant neoplasm of right ovary: Secondary | ICD-10-CM

## 2016-01-08 DIAGNOSIS — C569 Malignant neoplasm of unspecified ovary: Secondary | ICD-10-CM

## 2016-01-08 DIAGNOSIS — D500 Iron deficiency anemia secondary to blood loss (chronic): Secondary | ICD-10-CM

## 2016-01-08 MED ORDER — SODIUM CHLORIDE 0.9 % IV SOLN: 510.0000 mg | Freq: Once | INTRAVENOUS | Status: DC

## 2016-01-08 MED ORDER — SODIUM CHLORIDE 0.9 % IV SOLN
510.0000 mg | Freq: Once | INTRAVENOUS | Status: AC
  Filled 2016-01-08: qty 17

## 2016-01-08 NOTE — Telephone Encounter (Signed)
-----   Message from Gordy Levan, MD sent at 01/08/2016 10:02 AM EST ----- Labs seen and need follow up: please let patient know xray does not show marked constipation. I would like to get CT just to look things over, as we had mentioned might be possibility,  this week if possible prior to visit on 12-18. I have put in orders now and message to managed care to preauth   thanks

## 2016-01-08 NOTE — Telephone Encounter (Signed)
Told Audrey Porter the results of the abdominal x-ray as noted below by Dr. Marko Plume.

## 2016-01-09 ENCOUNTER — Telehealth: Payer: Self-pay

## 2016-01-09 NOTE — Telephone Encounter (Signed)
-----   Message from Gordy Levan, MD sent at 01/08/2016 10:44 AM EST ----- Regarding: message #2  (See also message re yesterday's abd xray - I believe that went as a results message to RN) Her iron was also low 2 weeks ago, and likely would help some with energy and anemia to replete this more quickly than oral iron will accomplish.  I would like to give IV iron within next week or so, and a second dose a week or so later if she agrees - feraheme.  Orders in for 12-15 and 12-22 , may need to adjust dates. She has visit with me on 12-18. . I did NOT send scheduling request for feraheme  Sorry more than one message for her

## 2016-01-09 NOTE — Telephone Encounter (Signed)
S/w pt per Dr Marko Plume attached message. She is amenable. In basket sent to scheduler for feraheme.

## 2016-01-11 ENCOUNTER — Telehealth: Payer: Self-pay

## 2016-01-11 NOTE — Telephone Encounter (Signed)
sw pt about CT prior auth requiring the insurance company talks with the patient. She will call insurance company. She asked about feraheme infusion appts. I discussed with Sharyn Lull in infusion and got appts set. When I called pt back she was talking with insurance co on the other line,   She said her authorization number for CT is  [id number].  inbasket sent to darlena to update chart.

## 2016-01-11 NOTE — Telephone Encounter (Signed)
Called central scheduling and scheduled CT for 12/20 at 1030, s/w pt about CT: NPO after 630 am, drink first bottle contrast at 830 am and 2nd bottle at 930 am. Pt wrote down instructions. She will pick up contrast on Monday.

## 2016-01-13 ENCOUNTER — Other Ambulatory Visit: Payer: Self-pay | Admitting: Oncology

## 2016-01-13 DIAGNOSIS — C569 Malignant neoplasm of unspecified ovary: Secondary | ICD-10-CM

## 2016-01-14 ENCOUNTER — Encounter: Payer: Self-pay | Admitting: Oncology

## 2016-01-14 ENCOUNTER — Ambulatory Visit (HOSPITAL_COMMUNITY)
Admission: RE | Admit: 2016-01-14 | Discharge: 2016-01-14 | Disposition: A | Discharge status: Home / Self Care | Payer: Managed Care, Other (non HMO) | Source: Ambulatory Visit | Attending: Oncology | Admitting: Oncology

## 2016-01-14 ENCOUNTER — Ambulatory Visit (HOSPITAL_BASED_OUTPATIENT_CLINIC_OR_DEPARTMENT_OTHER): Payer: Managed Care, Other (non HMO) | Admitting: Oncology

## 2016-01-14 ENCOUNTER — Other Ambulatory Visit (HOSPITAL_BASED_OUTPATIENT_CLINIC_OR_DEPARTMENT_OTHER): Payer: Managed Care, Other (non HMO)

## 2016-01-14 ENCOUNTER — Ambulatory Visit (HOSPITAL_BASED_OUTPATIENT_CLINIC_OR_DEPARTMENT_OTHER): Payer: Managed Care, Other (non HMO)

## 2016-01-14 ENCOUNTER — Ambulatory Visit: Payer: Managed Care, Other (non HMO)

## 2016-01-14 ENCOUNTER — Other Ambulatory Visit: Payer: Self-pay

## 2016-01-14 VITALS — BP 114/79 | HR 99 | Temp 98.9°F | Resp 18

## 2016-01-14 VITALS — BP 134/69 | HR 96 | Temp 98.8°F | Resp 18 | Ht 65.0 in | Wt 206.2 lb

## 2016-01-14 DIAGNOSIS — C562 Malignant neoplasm of left ovary: Secondary | ICD-10-CM

## 2016-01-14 DIAGNOSIS — C561 Malignant neoplasm of right ovary: Secondary | ICD-10-CM

## 2016-01-14 DIAGNOSIS — D500 Iron deficiency anemia secondary to blood loss (chronic): Secondary | ICD-10-CM

## 2016-01-14 DIAGNOSIS — C569 Malignant neoplasm of unspecified ovary: Secondary | ICD-10-CM

## 2016-01-14 DIAGNOSIS — N183 Chronic kidney disease, stage 3 unspecified: Secondary | ICD-10-CM

## 2016-01-14 DIAGNOSIS — Z95828 Presence of other vascular implants and grafts: Secondary | ICD-10-CM

## 2016-01-14 DIAGNOSIS — D6481 Anemia due to antineoplastic chemotherapy: Secondary | ICD-10-CM

## 2016-01-14 DIAGNOSIS — T451X5A Adverse effect of antineoplastic and immunosuppressive drugs, initial encounter: Secondary | ICD-10-CM

## 2016-01-14 DIAGNOSIS — G620 Drug-induced polyneuropathy: Secondary | ICD-10-CM | POA: Diagnosis not present

## 2016-01-14 DIAGNOSIS — C563 Malignant neoplasm of bilateral ovaries: Secondary | ICD-10-CM

## 2016-01-14 LAB — CBC WITH DIFFERENTIAL/PLATELET
BASO%: 0.1 % (ref 0.0–2.0)
BASOS ABS: 0 10*3/uL (ref 0.0–0.1)
EOS ABS: 0 10*3/uL (ref 0.0–0.5)
EOS%: 0.2 % (ref 0.0–7.0)
HCT: 24.6 % — ABNORMAL LOW (ref 34.8–46.6)
HGB: 8.3 g/dL — ABNORMAL LOW (ref 11.6–15.9)
LYMPH%: 21 % (ref 14.0–49.7)
MCH: 28 pg (ref 25.1–34.0)
MCHC: 33.7 g/dL (ref 31.5–36.0)
MCV: 83.1 fL (ref 79.5–101.0)
MONO#: 0.8 10*3/uL (ref 0.1–0.9)
MONO%: 10.3 % (ref 0.0–14.0)
NEUT#: 5.6 10*3/uL (ref 1.5–6.5)
NEUT%: 68.4 % (ref 38.4–76.8)
Platelets: 139 10*3/uL — ABNORMAL LOW (ref 145–400)
RBC: 2.96 10*6/uL — ABNORMAL LOW (ref 3.70–5.45)
RDW: 16 % — ABNORMAL HIGH (ref 11.2–14.5)
WBC: 8.1 10*3/uL (ref 3.9–10.3)
lymph#: 1.7 10*3/uL (ref 0.9–3.3)

## 2016-01-14 LAB — COMPREHENSIVE METABOLIC PANEL
ALBUMIN: 3.1 g/dL — AB (ref 3.5–5.0)
ALK PHOS: 71 U/L (ref 40–150)
ALT: 22 U/L (ref 0–55)
AST: 18 U/L (ref 5–34)
Anion Gap: 10 mEq/L (ref 3–11)
BUN: 12.1 mg/dL (ref 7.0–26.0)
CALCIUM: 9.3 mg/dL (ref 8.4–10.4)
CHLORIDE: 107 meq/L (ref 98–109)
CO2: 24 mEq/L (ref 22–29)
Creatinine: 1.2 mg/dL — ABNORMAL HIGH (ref 0.6–1.1)
EGFR: 59 mL/min/{1.73_m2} — AB (ref >90)
GLUCOSE: 112 mg/dL (ref 70–140)
POTASSIUM: 3.7 meq/L (ref 3.5–5.1)
SODIUM: 141 meq/L (ref 136–145)
Total Bilirubin: 0.45 mg/dL (ref 0.20–1.20)
Total Protein: 7.5 g/dL (ref 6.4–8.3)

## 2016-01-14 LAB — PREPARE RBC (CROSSMATCH)

## 2016-01-14 MED ORDER — SODIUM CHLORIDE 0.9% FLUSH
10.0000 mL | INTRAVENOUS | Status: DC | PRN
  Administered 2016-01-14: 10 mL via INTRAVENOUS
  Filled 2016-01-14: qty 10

## 2016-01-14 MED ORDER — HEPARIN SOD (PORK) LOCK FLUSH 100 UNIT/ML IV SOLN
500.0000 [IU] | Freq: Once | INTRAVENOUS | Status: AC | PRN
  Administered 2016-01-14: 500 [IU] via INTRAVENOUS
  Filled 2016-01-14: qty 5

## 2016-01-14 MED ORDER — SODIUM CHLORIDE 0.9 % IV SOLN
Freq: Once | INTRAVENOUS | Status: AC
  Administered 2016-01-14: 12:00:00 via INTRAVENOUS

## 2016-01-14 MED ORDER — FERUMOXYTOL INJECTION 510 MG/17 ML
510.0000 mg | Freq: Once | INTRAVENOUS | Status: AC
  Administered 2016-01-14: 510 mg via INTRAVENOUS
  Filled 2016-01-14: qty 17

## 2016-01-14 NOTE — Progress Notes (Signed)
OFFICE PROGRESS NOTE   January 14, 2016   Physicians:  Barron Alvine, Mitzie Na, MD (PCP), Alden Hipp), Gari Crown [facility])  INTERVAL HISTORY:   Patient is seen, together with daughter (4th year med student), in continuing attention to recurrent ovarian cancer for which she is receiving carboplatin taxol. She had cycle 2 on 12-28-15, due cycle 3 on 01-24-16.  She is also quite iron deficient, will have first feraheme today then second feraheme 01-24-16. She is having a difficult time with chemo (iron deficiency may be contributing) and has noticed some different upper abdominal discomfort / fullness at hs (not changed from last week) such that she is set up for repeat CT AP on 01-16-16.  Tolerates oral iron very poorly and has been using this only occasionally, will stop attempting oral iron with IV dosing now.  She is fatigued with fairly minimal exertion, seems related to the progressive anemia. No bleeding. Not SOB at rest, no chest pain. No cough or wheezing. No significant nausea now. Bowels are moving. No LE swelling, No problems with PAC. No fever. Peripheral neuropathy in feet > hands may be just a bit more noticeable Remainder of 10 point Review of Systems negative.   Flu vaccine 11-14-15 CA 125 preoperatively 56, this 5 on 09-14-14. 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  ONCOLOGIC HISTORY Patient had been in usual good health, up to date on all medical care, when she developed bladder symptoms in early 2016. Urine culture showed no infection, then CT AP at [facility] 03-16-14 found perihepatic ascites and pelvic mass 9.9 x 20.1 x 17.8 cm which was cystic and solid, kidneys and ureters unremarkable, no pelvic fluid, rectosigmoid partially compressed, other organs not remarkable, no mention of adenopathy. She was seen by Dr Evie Lacks on referral  from Dr Quillian Quince after CT resulted; CT findings confirmed by transvaginal US. CA 125 was 56 pre op. She saw Dr Alycia Rossetti in consultation on 03-23-14, then had surgery at [facility] on 03-31-14 TAH/BSO, omentectomy, pelvic and paraaortic node evaluation. Surgical findings were of bilateral complex ovarian masses and dense adhesions, with intra-operative spill of right ovarian mass as that was densely adherent to sigmoid serosa; posterior uterine segment also adherent to rectosigmoid, and retroperitoneal fibrosis bilaterally. 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 Cytology from ascites [facility] [phone]) was positive for adenocarcinoma of gyn primary. Multidisciplinary conference at [facility] 04-19-14 recommended carboplatin and taxane chemotherapy. Cycle 1 carbo taxol was given on 05-12-14, with neutropenia by day 11 (ANC 0.8) despite having started granix on day 8.She was hospitalized at [facility] after cycle 3 with syncope related to dehydration. She completed cycle 6 on 09-01-14. PET 08-2014 no clear residual or progressive disease; colonoscopy was unremarkable. CA 125 was 7.5 in 06-2015, then up to 18.4 by 10-2015. CT CAP 11-19-15 showed new progression subcapsular areas of liver, at proximal sigmoid colon and in mesentery, with small ascites. She began chemotherapy for the recurrence with carbo taxol on 12-06-15, with on pro neulasta. She had 2 units PRBCs for hemoglobin down to 7.5 and symptomatic on 12-21-15.   Objective:  Vital signs in last 24 hours:  BP 134/69 (BP Location: Left Arm, Patient Position: Sitting)   Pulse 96   Temp  98.8 F (37.1 C) (Oral)   Resp 18   Ht '5\' 5"'  (1.651 m)   Wt 206 lb 3.2 oz (93.5 kg)   SpO2 100%   BMI 34.31 kg/m  Weight down 2 lbs Alert, oriented and appropriate, not in acute discomfort, looks fatigued.  Ambulatory without assistance.  Alopecia  HEENT:PERRL, sclerae not icteric. Oral mucosa moist without lesions, posterior pharynx clear.  Neck supple. No JVD.  Lymphatics:no cervical,suraclavicular, axillary or inguinal adenopathy Resp: clear to auscultation bilaterally and normal percussion bilaterally Cardio: regular rate and rhythm. No gallop. GI: soft, nontender, slightly distended in upper quadrants, no clear mass or organomegaly. A few bowel sounds. Surgical incision not remarkable. Musculoskeletal/ Extremities: LE without pitting edema, cords, tenderness Neuro: peripheral neuropathy feet primarily, as above. Otherwise nonfocal. PSYCH appropriate mood and affect Skin without rash, ecchymosis, petechiae. Nailbeds and mucous membranes pale Portacath-without erythema or tenderness  Lab Results:  Results for orders placed or performed in visit on 01/14/16  CBC with Differential  Result Value Ref Range   WBC 8.1 3.9 - 10.3 10e3/uL   NEUT# 5.6 1.5 - 6.5 10e3/uL   HGB 8.3 (L) 11.6 - 15.9 g/dL   HCT 24.6 (L) 34.8 - 46.6 %   Platelets 139 (L) 145 - 400 10e3/uL   MCV 83.1 79.5 - 101.0 fL   MCH 28.0 25.1 - 34.0 pg   MCHC 33.7 31.5 - 36.0 g/dL   RBC 2.96 (L) 3.70 - 5.45 10e6/uL   RDW 16.0 (H) 11.2 - 14.5 %   lymph# 1.7 0.9 - 3.3 10e3/uL   MONO# 0.8 0.1 - 0.9 10e3/uL   Eosinophils Absolute 0.0 0.0 - 0.5 10e3/uL   Basophils Absolute 0.0 0.0 - 0.1 10e3/uL   NEUT% 68.4 38.4 - 76.8 %   LYMPH% 21.0 14.0 - 49.7 %   MONO% 10.3 0.0 - 14.0 %   EOS% 0.2 0.0 - 7.0 %   BASO% 0.1 0.0 - 2.0 %  Comprehensive metabolic panel  Result Value Ref Range   Sodium 141 136 - 145 mEq/L   Potassium 3.7 3.5 - 5.1 mEq/L   Chloride 107 98 - 109 mEq/L   CO2 24 22 - 29 mEq/L   Glucose 112 70 - 140 mg/dl   BUN 12.1 7.0 - 26.0 mg/dL   Creatinine 1.2 (H) 0.6 - 1.1 mg/dL   Total Bilirubin 0.45 0.20 - 1.20 mg/dL   Alkaline Phosphatase 71 40 - 150 U/L   AST 18 5 - 34 U/L   ALT 22 0 - 55 U/L   Total Protein 7.5  6.4 - 8.3 g/dL   Albumin 3.1 (L) 3.5 - 5.0 g/dL   Calcium 9.3 8.4 - 10.4 mg/dL   Anion Gap 10 3 - 11 mEq/L   EGFR 59 (L) >90 ml/min/1.73 m2    Iron studies 12-21-15 had serum iron 32 and %sat 15  Studies/Results:  No results found.  CT AP planned 01-16-16, as unable to get scheduled prior to visit today.  I will be in touch with her when that information is availab.e  Medications: I have reviewed the patient's current medications. Tolerates oral iron very poorly and has been using this only occasionally, will stop attempting oral iron with IV dosing now.   DISCUSSION Will give first dose feraheme today and second dose in ~ 1-2 weeks. WIll give 1 unit PRBCs this week.   She understands that we will not continue present carbo taxol if disease is progressing, tho will continue as possible if  stable or improved, as she has had only 2 cycles thus far. I will keep Dr Alycia Rossetti updated.  Assessment/Plan: 1.Recurrent ovarian cancer 14 months from completion of adjuvant chemotherapy for IIIA1 high grade clear cell of bilateral ovaries: Cycle 2carboplatin and taxol given12-1-17 with [place] skin testing and on pro neulasta. Should be platinum sensitive given time to recurrence, however concerns as above, clinically cannot tell improvement from 2 cycles as yet and some increased abdominal discomfort. If obviously progressing will need to change, but if stable or some improvement will continue with cycle 3 as planned.  2. Chemo and iron deficiency anemia: transfused 2 units PRBCs 12-24-15, lower again and more symptomatic.  Does not tolerate oral iron well. First feraheme today, 1 unit PRBCs as soon as possible, second feraheme in next ~ 1-2 weeks.  3. 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 4.benign right breast biopsy at [facility] on 07-25-14 5.previous fulguration of endometriosis, post C section 6.GERD  controlled with prescription bidprilosec 7. Colonoscopy ~ 09-2014 by Dr Anthony Sar 8.environmental allergies, zyrtec and flonase prn. 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  Dr Quillian Quince manages antihypertensives. Carboplatin dosed by AUC  11.Peripheral IV access not adequate for treatment. PAC by IR 12-04-15  12.history of HTN, but also hypotensive/ orthostatic during adjuvant chemo. Push fluids po, follow home BP, hold meds prn.    Feraheme and PRBC transfusion orders placed and scheduling done. All questions answered and she knows to call if needed prior to next appointments. Time spent 30 min including >50% counseling and coordination of care. CC Drs Gwyndolyn Kaufman to update    Evlyn Clines, MD   01/14/2016, 10:54 AM

## 2016-01-14 NOTE — Patient Instructions (Signed)
Ferumoxytol injection What is this medicine? FERUMOXYTOL is an iron complex. Iron is used to make healthy red blood cells, which carry oxygen and nutrients throughout the body. This medicine is used to treat iron deficiency anemia in people with chronic kidney disease. COMMON BRAND NAME(S): Feraheme What should I tell my health care provider before I take this medicine? They need to know if you have any of these conditions: -anemia not caused by low iron levels -high levels of iron in the blood -magnetic resonance imaging (MRI) test scheduled -an unusual or allergic reaction to iron, other medicines, foods, dyes, or preservatives -pregnant or trying to get pregnant -breast-feeding How should I use this medicine? This medicine is for injection into a vein. It is given by a health care professional in a hospital or clinic setting. Talk to your pediatrician regarding the use of this medicine in children. Special care may be needed. What if I miss a dose? It is important not to miss your dose. Call your doctor or health care professional if you are unable to keep an appointment. What may interact with this medicine? This medicine may interact with the following medications: -other iron products What should I watch for while using this medicine? Visit your doctor or healthcare professional regularly. Tell your doctor or healthcare professional if your symptoms do not start to get better or if they get worse. You may need blood work done while you are taking this medicine. You may need to follow a special diet. Talk to your doctor. Foods that contain iron include: whole grains/cereals, dried fruits, beans, or peas, leafy green vegetables, and organ meats (liver, kidney). What side effects may I notice from receiving this medicine? Side effects that you should report to your doctor or health care professional as soon as possible: -allergic reactions like skin rash, itching or hives, swelling of the  face, lips, or tongue -breathing problems -changes in blood pressure -feeling faint or lightheaded, falls -fever or chills -flushing, sweating, or hot feelings -swelling of the ankles or feet Side effects that usually do not require medical attention (report to your doctor or health care professional if they continue or are bothersome): -diarrhea -headache -nausea, vomiting -stomach pain Where should I keep my medicine? This drug is given in a hospital or clinic and will not be stored at home.  2017 Elsevier/Gold Standard (2015-02-15 12:41:49)  

## 2016-01-15 LAB — CA 125: CANCER ANTIGEN (CA) 125: 74.6 U/mL — AB (ref 0.0–38.1)

## 2016-01-16 ENCOUNTER — Encounter (HOSPITAL_COMMUNITY): Payer: Self-pay

## 2016-01-16 ENCOUNTER — Ambulatory Visit (HOSPITAL_BASED_OUTPATIENT_CLINIC_OR_DEPARTMENT_OTHER): Payer: Managed Care, Other (non HMO)

## 2016-01-16 ENCOUNTER — Encounter (HOSPITAL_COMMUNITY)
Admission: RE | Admit: 2016-01-16 | Discharge: 2016-01-16 | Disposition: A | Discharge status: Home / Self Care | Payer: Managed Care, Other (non HMO) | Source: Ambulatory Visit | Attending: Oncology | Admitting: Oncology

## 2016-01-16 DIAGNOSIS — D6481 Anemia due to antineoplastic chemotherapy: Secondary | ICD-10-CM | POA: Diagnosis not present

## 2016-01-16 DIAGNOSIS — C569 Malignant neoplasm of unspecified ovary: Secondary | ICD-10-CM | POA: Insufficient documentation

## 2016-01-16 DIAGNOSIS — C561 Malignant neoplasm of right ovary: Secondary | ICD-10-CM | POA: Diagnosis not present

## 2016-01-16 DIAGNOSIS — T451X5A Adverse effect of antineoplastic and immunosuppressive drugs, initial encounter: Principal | ICD-10-CM

## 2016-01-16 DIAGNOSIS — D500 Iron deficiency anemia secondary to blood loss (chronic): Secondary | ICD-10-CM | POA: Diagnosis not present

## 2016-01-16 MED ORDER — HEPARIN SOD (PORK) LOCK FLUSH 100 UNIT/ML IV SOLN
500.0000 [IU] | Freq: Once | INTRAVENOUS | Status: AC
  Administered 2016-01-16: 500 [IU] via INTRAVENOUS
  Filled 2016-01-16: qty 5

## 2016-01-16 MED ORDER — IOPAMIDOL (ISOVUE-300) INJECTION 61%
100.0000 mL | Freq: Once | INTRAVENOUS | Status: AC | PRN
  Administered 2016-01-16: 100 mL via INTRAVENOUS

## 2016-01-16 MED ORDER — SODIUM CHLORIDE 0.9 % IV SOLN
250.0000 mL | Freq: Once | INTRAVENOUS | Status: AC
  Administered 2016-01-16: 250 mL via INTRAVENOUS

## 2016-01-16 MED ORDER — ACETAMINOPHEN 325 MG PO TABS
325.0000 mg | ORAL_TABLET | Freq: Once | ORAL | Status: AC
  Administered 2016-01-16: 325 mg via ORAL

## 2016-01-16 MED ORDER — ACETAMINOPHEN 325 MG PO TABS
ORAL_TABLET | ORAL | Status: AC
  Filled 2016-01-16: qty 1

## 2016-01-16 MED ORDER — SODIUM CHLORIDE 0.9% FLUSH
10.0000 mL | INTRAVENOUS | Status: DC | PRN
  Administered 2016-01-16: 10 mL via INTRAVENOUS
  Filled 2016-01-16: qty 10

## 2016-01-16 NOTE — Patient Instructions (Signed)
Blood Transfusion , Adult A blood transfusion is a procedure in which you receive donated blood, including plasma, platelets, and red blood cells, through an IV tube. You may need a blood transfusion because of illness, surgery, or injury. The blood may come from a donor. You may also be able to donate blood for yourself (autologous blood donation) before a surgery if you know that you might require a blood transfusion. The blood given in a transfusion is made up of different types of cells. You may receive:  Red blood cells. These carry oxygen to the cells in the body.  White blood cells. These help you fight infections.  Platelets. These help your blood to clot.  Plasma. This is the liquid part of your blood and it helps with fluid imbalances. If you have hemophilia or another clotting disorder, you may also receive other types of blood products. Tell a health care provider about:  Any allergies you have.  All medicines you are taking, including vitamins, herbs, eye drops, creams, and over-the-counter medicines.  Any problems you or family members have had with anesthetic medicines.  Any blood disorders you have.  Any surgeries you have had.  Any medical conditions you have, including any recent fever or cold symptoms.  Whether you are pregnant or may be pregnant.  Any previous reactions you have had during a blood transfusion. What are the risks? Generally, this is a safe procedure. However, problems may occur, including:  Having an allergic reaction to something in the donated blood. Hives and itching may be symptoms of this type of reaction.  Fever. This may be a reaction to the white blood cells in the transfused blood. Nausea or chest pain may accompany a fever.  Iron overload. This can happen from having many transfusions.  Transfusion-related acute lung injury (TRALI). This is a rare reaction that causes lung damage. The cause is not known.TRALI can occur within hours  of a transfusion or several days later.  Sudden (acute) or delayed hemolytic reactions. This happens if your blood does not match the cells in your transfusion. Your body's defense system (immune system) may try to attack the new cells. This complication is rare. The symptoms include fever, chills, nausea, and low back pain or chest pain.  Infection or disease transmission. This is rare. What happens before the procedure?  You will have a blood test to determine your blood type. This is necessary to know what kind of blood your body will accept and to match it to the donor blood.  If you are going to have a planned surgery, you may be able to do an autologous blood donation. This may be done in case you need to have a transfusion.  If you have had an allergic reaction to a transfusion in the past, you may be given medicine to help prevent a reaction. This medicine may be given to you by mouth or through an IV tube.  You will have your temperature, blood pressure, and pulse monitored before the transfusion.  Follow instructions from your health care provider about eating and drinking restrictions.  Ask your health care provider about:  Changing or stopping your regular medicines. This is especially important if you are taking diabetes medicines or blood thinners.  Taking medicines such as aspirin and ibuprofen. These medicines can thin your blood. Do not take these medicines before your procedure if your health care provider instructs you not to. What happens during the procedure?  An IV tube will be   inserted into one of your veins.  The bag of donated blood will be attached to your IV tube. The blood will then enter through your vein.  Your temperature, blood pressure, and pulse will be monitored regularly during the transfusion. This monitoring is done to detect early signs of a transfusion reaction.  If you have any signs or symptoms of a reaction, your transfusion will be stopped and  you may be given medicine.  When the transfusion is complete, your IV tube will be removed.  Pressure may be applied to the IV site for a few minutes.  A bandage (dressing) will be applied. The procedure may vary among health care providers and hospitals. What happens after the procedure?  Your temperature, blood pressure, heart rate, breathing rate, and blood oxygen level will be monitored often.  Your blood may be tested to see how you are responding to the transfusion.  You may be warmed with fluids or blankets to maintain a normal body temperature. Summary  A blood transfusion is a procedure in which you receive donated blood, including plasma, platelets, and red blood cells, through an IV tube.  Your temperature, blood pressure, and pulse will be monitored before, during, and after the transfusion.  Your blood may be tested after the transfusion to see how your body has responded. This information is not intended to replace advice given to you by your health care provider. Make sure you discuss any questions you have with your health care provider. Document Released: 01/11/2000 Document Revised: 10/11/2015 Document Reviewed: 10/11/2015 Elsevier Interactive Patient Education  2017 [employer]  

## 2016-01-17 LAB — TYPE AND SCREEN
ABO/RH(D): B POS
Antibody Screen: NEGATIVE
Unit division: 0

## 2016-01-17 LAB — PREPARE RBC (CROSSMATCH)

## 2016-01-18 ENCOUNTER — Other Ambulatory Visit: Payer: Self-pay | Admitting: Oncology

## 2016-01-18 ENCOUNTER — Telehealth: Payer: Self-pay | Admitting: Oncology

## 2016-01-18 DIAGNOSIS — C569 Malignant neoplasm of unspecified ovary: Secondary | ICD-10-CM

## 2016-01-18 DIAGNOSIS — Z79899 Other long term (current) drug therapy: Secondary | ICD-10-CM

## 2016-01-18 NOTE — Telephone Encounter (Signed)
Medical Oncology  CT AP 01-16-16 results noted, progression around liver and other areas. Will update Dr Alycia Rossetti.  Spoke with patient by phone to let her know that present taxol and carboplatin are not benefitting. Suggested change to doxil +/- avastin. Discussed mechanism of action and possible side effects of the doxil and avastin; will have RN do full teaching probably by phone. Particularly discussed possible skin effects from doxil, possible cumulative effects on cardiac EF, bleeding concerns and some risk of bowel perforation with avastin. Fortunately neither of these should worsen peripheral neuropathy in feet, blood counts likely will tolerate doxil better than other chemo and hair may grow back with doxil. Verbal consent given for chemo from this information.  No previous echocardiogram in this EMR; patient does not recall this being done by Dr Quillian Quince either. Order placed for echo and LM with echo lab at [facility]. Could give cycle 1 doxil prior to echo if not possible to get it done prior, but hopefully will be able to get this before 12-18.  Patient is scheduled for next chemo on 01-24-16, planned as #3 taxol carbo. Will try to use that infusion appointment for doxil instead; may not give avastin that day if have not been able to discuss with Dr Alycia Rossetti by that time. Orders placed using [employer] for doxil + avastin, and message sent to managed care for preauth.   Patient has felt some better since IV iron and 1 unit PRBCs this week, with energy better. She is understandably upset by this information, tho tells me that she wanted to know information from the scan and was glad to get the call.  She would like to go ahead with next treatment as soon as possible.  All questions answered and she knows to call if needed, and that we will let her know about echocardiogram and preauth for treatment when that information is available.   Godfrey Pick, MD

## 2016-01-18 NOTE — Progress Notes (Signed)
DISCONTINUE ON PATHWAY REGIMEN - Ovarian  OVOS86: Carboplatin AUC=6 + Paclitaxel 175 mg/m2 q21 Days; Stop Treatment After 6 Cycles If Complete Response, Otherwise Continue Treatment Until Progression or Unacceptable Toxicity   A cycle is every 21 days:     Paclitaxel (Taxol(R)) 175 mg/m2 in 500 mL NS IV over 3 hours day 1 Dose Mod: None     Carboplatin (Paraplatin(R)) AUC=6 in 250 mL NS IV over 1 hour day 1 Dose Mod: None Additional Orders: * All AUC calculations intended to be used in [employer] formula  **Always confirm dose/schedule in your pharmacy ordering system**    REASON: Disease Progression PRIOR TREATMENT: OVOS86: Carboplatin AUC=6 + Paclitaxel 175 mg/m2 q21 Days; Stop Treatment After 6 Cycles If Complete Response, Otherwise Continue Treatment Until Progression or Unacceptable Toxicity TREATMENT RESPONSE: Progressive Disease (PD)  START ON PATHWAY REGIMEN - Ovarian  OVOS98: Liposomal Doxorubicin (Doxil) 40 mg/m2 D1 + Bevacizumab 10 mg/kg D1, 15 q28 Days; Re-evaluate Every 3 Cycles, Treat until Complete Response, Unacceptable Toxicity, or Disease Progression   A cycle is every 28 days:     Liposomal Doxorubicin (Doxil(R)) 40 mg/m2 in 250 mL D5W IV over 60 minutes day 1, initial infusion rate should not exceed 1 mg/min Dose Mod: None     Bevacizumab (Avastin(R)) 10 mg/kg in 100 mL NS IV days 1 and 15, over 90 minutes first infusion, 60 minutes second infusion, and 30 minutes all subsequent infusions if tolerated Dose Mod: None  **Always confirm dose/schedule in your pharmacy ordering system**    Patient Characteristics: Third Line Therapy, Platinum Resistant or < 6 Months Since Last Platinum Therapy Stage Grouping: IIIA AJCC T Stage: X AJCC N Stage: X AJCC M Stage: X Line of Therapy: Third Line Therapy BRCA Mutation Status: Absent Would you be surprised if this patient died  in the next year? I would be surprised if this patient died in the next year  Intent of  Therapy: Non-Curative / Palliative Intent, Discussed with Patient

## 2016-01-22 ENCOUNTER — Telehealth: Payer: Self-pay

## 2016-01-22 ENCOUNTER — Telehealth: Payer: Self-pay | Admitting: Oncology

## 2016-01-22 NOTE — Telephone Encounter (Addendum)
S/w vascular dept and scheduled echo for 12/29 at 0900 at [facility]. This was 1st available not interfering with chemo.  Sent in basket to [place] for prior [employer] pt and gave echo appt Also talked with pt about doxil, skin care, avastin, and urine protein checks.   Explained no decadron before doxil.

## 2016-01-22 NOTE — Telephone Encounter (Signed)
Per 12/26 sch msg 12/28 appointments cxd and moved to 12/29 (chemo doxil/avastin) per infusion supervisor (latest for infusion 1:30 pm).   Left message for patient re change and new date/time for 12/29 @ 12:15 pm. Also confirmed echo at [facility] 12/29.

## 2016-01-22 NOTE — Telephone Encounter (Signed)
-----   Message from Gordy Levan, MD sent at 01/18/2016  3:04 PM EST ----- Regarding: teaching doxil avastin Progression on taxol carbo by CT 12-20. [place] talked with her 12-22 re change to doxil every 4 weeks, with avastin every 2 weeks.  Hope to start at least doxil on 12-28 using infusion spot already in place.  Sent order for echocardiogram and LM [facility] echo dept, hoping to get echo any location prior to 12-28 if possible, otherwise will get it after cycle 1.  Please do teaching by phone for doxil avastin before 12-28. Tell her no decadron before doxil. Go over skin care carefully.    I can talk with her again if any questions. I will decide about starting avastin when I discuss with Dr Alycia Rossetti.   thanks

## 2016-01-22 NOTE — Telephone Encounter (Signed)
-----   Message from Gaspar Bidding sent at 01/22/2016  2:47 PM EST ----- Regarding: RE: prior auth for echo Per [place], Ref#  [id number]. NPR. ----- Message ----- From: Janace Hoard, RN Sent: 01/22/2016   8:53 AM To: Gaspar Bidding Subject: prior auth for echo                            Please obtain PA for [place] for echo, the echo is scheduled for 12/29. thanks

## 2016-01-22 NOTE — Telephone Encounter (Signed)
PA for echo obtained.

## 2016-01-23 ENCOUNTER — Encounter: Payer: Self-pay | Admitting: Oncology

## 2016-01-23 ENCOUNTER — Telehealth: Payer: Self-pay

## 2016-01-23 NOTE — Telephone Encounter (Signed)
Pt called to clarify her appts. Called her back and explained the changes.

## 2016-01-23 NOTE — Progress Notes (Signed)
Medical Oncology  Dr Alycia Rossetti updated on situation including findings of CT AP from 01-16-16. Dr Alycia Rossetti agrees with doxil and avastin, tho we are aware of risk of bowel perforation from avastin.  MD schedule rearranged to see patient after echocardiogram and prior to treatment on 01-25-16.  Peggye Fothergill, MD

## 2016-01-24 ENCOUNTER — Encounter: Payer: Self-pay | Admitting: Pharmacist

## 2016-01-24 ENCOUNTER — Ambulatory Visit: Payer: Self-pay

## 2016-01-24 ENCOUNTER — Other Ambulatory Visit: Payer: Self-pay

## 2016-01-25 ENCOUNTER — Ambulatory Visit (HOSPITAL_BASED_OUTPATIENT_CLINIC_OR_DEPARTMENT_OTHER): Payer: Managed Care, Other (non HMO) | Admitting: Oncology

## 2016-01-25 ENCOUNTER — Encounter: Payer: Self-pay | Admitting: Oncology

## 2016-01-25 ENCOUNTER — Other Ambulatory Visit (HOSPITAL_BASED_OUTPATIENT_CLINIC_OR_DEPARTMENT_OTHER): Payer: Managed Care, Other (non HMO)

## 2016-01-25 ENCOUNTER — Ambulatory Visit (HOSPITAL_BASED_OUTPATIENT_CLINIC_OR_DEPARTMENT_OTHER): Payer: Managed Care, Other (non HMO)

## 2016-01-25 ENCOUNTER — Ambulatory Visit (HOSPITAL_COMMUNITY)
Admission: RE | Admit: 2016-01-25 | Discharge: 2016-01-25 | Disposition: A | Discharge status: Home / Self Care | Payer: Managed Care, Other (non HMO) | Source: Ambulatory Visit | Attending: Oncology | Admitting: Oncology

## 2016-01-25 ENCOUNTER — Telehealth: Payer: Self-pay

## 2016-01-25 ENCOUNTER — Other Ambulatory Visit: Payer: Self-pay | Admitting: Oncology

## 2016-01-25 VITALS — BP 142/71 | HR 98 | Temp 97.9°F | Resp 18 | Ht 65.0 in | Wt 204.2 lb

## 2016-01-25 VITALS — BP 115/73

## 2016-01-25 DIAGNOSIS — Z79899 Other long term (current) drug therapy: Secondary | ICD-10-CM | POA: Insufficient documentation

## 2016-01-25 DIAGNOSIS — G620 Drug-induced polyneuropathy: Secondary | ICD-10-CM | POA: Diagnosis not present

## 2016-01-25 DIAGNOSIS — E639 Nutritional deficiency, unspecified: Secondary | ICD-10-CM

## 2016-01-25 DIAGNOSIS — C561 Malignant neoplasm of right ovary: Secondary | ICD-10-CM

## 2016-01-25 DIAGNOSIS — K219 Gastro-esophageal reflux disease without esophagitis: Secondary | ICD-10-CM | POA: Diagnosis not present

## 2016-01-25 DIAGNOSIS — C569 Malignant neoplasm of unspecified ovary: Secondary | ICD-10-CM

## 2016-01-25 DIAGNOSIS — Z09 Encounter for follow-up examination after completed treatment for conditions other than malignant neoplasm: Secondary | ICD-10-CM | POA: Insufficient documentation

## 2016-01-25 DIAGNOSIS — D500 Iron deficiency anemia secondary to blood loss (chronic): Secondary | ICD-10-CM

## 2016-01-25 DIAGNOSIS — I071 Rheumatic tricuspid insufficiency: Secondary | ICD-10-CM | POA: Insufficient documentation

## 2016-01-25 DIAGNOSIS — Z5112 Encounter for antineoplastic immunotherapy: Secondary | ICD-10-CM

## 2016-01-25 DIAGNOSIS — Z95828 Presence of other vascular implants and grafts: Secondary | ICD-10-CM

## 2016-01-25 DIAGNOSIS — C562 Malignant neoplasm of left ovary: Secondary | ICD-10-CM

## 2016-01-25 DIAGNOSIS — I10 Essential (primary) hypertension: Secondary | ICD-10-CM | POA: Diagnosis not present

## 2016-01-25 DIAGNOSIS — C563 Malignant neoplasm of bilateral ovaries: Secondary | ICD-10-CM

## 2016-01-25 DIAGNOSIS — Z9109 Other allergy status, other than to drugs and biological substances: Secondary | ICD-10-CM

## 2016-01-25 DIAGNOSIS — T451X5A Adverse effect of antineoplastic and immunosuppressive drugs, initial encounter: Secondary | ICD-10-CM

## 2016-01-25 DIAGNOSIS — R634 Abnormal weight loss: Secondary | ICD-10-CM | POA: Diagnosis not present

## 2016-01-25 DIAGNOSIS — Z5111 Encounter for antineoplastic chemotherapy: Secondary | ICD-10-CM | POA: Diagnosis not present

## 2016-01-25 LAB — CBC WITH DIFFERENTIAL/PLATELET
BASO%: 1.1 % (ref 0.0–2.0)
Basophils Absolute: 0.1 10*3/uL (ref 0.0–0.1)
EOS%: 0.4 % (ref 0.0–7.0)
Eosinophils Absolute: 0 10*3/uL (ref 0.0–0.5)
HCT: 27.1 % — ABNORMAL LOW (ref 34.8–46.6)
HEMOGLOBIN: 9.2 g/dL — AB (ref 11.6–15.9)
LYMPH%: 21.4 % (ref 14.0–49.7)
MCH: 29.2 pg (ref 25.1–34.0)
MCHC: 34.1 g/dL (ref 31.5–36.0)
MCV: 85.7 fL (ref 79.5–101.0)
MONO#: 0.7 10*3/uL (ref 0.1–0.9)
MONO%: 8.9 % (ref 0.0–14.0)
NEUT%: 68.2 % (ref 38.4–76.8)
NEUTROS ABS: 5 10*3/uL (ref 1.5–6.5)
Platelets: 259 10*3/uL (ref 145–400)
RBC: 3.16 10*6/uL — AB (ref 3.70–5.45)
RDW: 17.1 % — AB (ref 11.2–14.5)
WBC: 7.4 10*3/uL (ref 3.9–10.3)
lymph#: 1.6 10*3/uL (ref 0.9–3.3)

## 2016-01-25 LAB — COMPREHENSIVE METABOLIC PANEL
ALBUMIN: 3.3 g/dL — AB (ref 3.5–5.0)
ALT: 12 U/L (ref 0–55)
AST: 14 U/L (ref 5–34)
Alkaline Phosphatase: 64 U/L (ref 40–150)
Anion Gap: 11 mEq/L (ref 3–11)
BILIRUBIN TOTAL: 0.43 mg/dL (ref 0.20–1.20)
BUN: 12.9 mg/dL (ref 7.0–26.0)
CO2: 22 meq/L (ref 22–29)
CREATININE: 1.4 mg/dL — AB (ref 0.6–1.1)
Calcium: 9.4 mg/dL (ref 8.4–10.4)
Chloride: 106 mEq/L (ref 98–109)
EGFR: 50 mL/min/{1.73_m2} — AB (ref >90)
GLUCOSE: 129 mg/dL (ref 70–140)
Potassium: 3.8 mEq/L (ref 3.5–5.1)
SODIUM: 140 meq/L (ref 136–145)
TOTAL PROTEIN: 7.8 g/dL (ref 6.4–8.3)

## 2016-01-25 LAB — UA PROTEIN, DIPSTICK - CHCC: Protein, ur: <30 mg/dL

## 2016-01-25 MED ORDER — SODIUM CHLORIDE 0.9 % IV SOLN: Freq: Once | INTRAVENOUS | Status: DC

## 2016-01-25 MED ORDER — DEXAMETHASONE SODIUM PHOSPHATE 10 MG/ML IJ SOLN
INTRAMUSCULAR | Status: AC
  Filled 2016-01-25: qty 1

## 2016-01-25 MED ORDER — DOXORUBICIN HCL LIPOSOMAL CHEMO INJECTION 2 MG/ML
39.0000 mg/m2 | Freq: Once | INTRAVENOUS | Status: AC
  Administered 2016-01-25: 80 mg via INTRAVENOUS
  Filled 2016-01-25: qty 40

## 2016-01-25 MED ORDER — ALBUTEROL SULFATE HFA 108 (90 BASE) MCG/ACT IN AERS: 2.0000 | INHALATION_SPRAY | Freq: Two times a day (BID) | RESPIRATORY_TRACT | 2 refills | Status: DC | PRN

## 2016-01-25 MED ORDER — SODIUM CHLORIDE 0.9% FLUSH
10.0000 mL | INTRAVENOUS | Status: DC | PRN
  Filled 2016-01-25: qty 10

## 2016-01-25 MED ORDER — LORAZEPAM 1 MG PO TABS
ORAL_TABLET | ORAL | Status: AC
  Filled 2016-01-25: qty 1

## 2016-01-25 MED ORDER — BENZONATATE 100 MG PO CAPS: 100.0000 mg | ORAL_CAPSULE | Freq: Three times a day (TID) | ORAL | 0 refills | Status: DC | PRN

## 2016-01-25 MED ORDER — LORAZEPAM 1 MG PO TABS
0.5000 mg | ORAL_TABLET | Freq: Once | ORAL | Status: AC | PRN
  Administered 2016-01-25: 1 mg via ORAL

## 2016-01-25 MED ORDER — ONDANSETRON HCL 4 MG/2ML IJ SOLN
INTRAMUSCULAR | Status: AC
  Filled 2016-01-25: qty 4

## 2016-01-25 MED ORDER — SODIUM CHLORIDE 0.9% FLUSH
10.0000 mL | INTRAVENOUS | Status: DC | PRN
  Administered 2016-01-25 (×2): 10 mL via INTRAVENOUS
  Filled 2016-01-25: qty 10

## 2016-01-25 MED ORDER — PREDNISONE 10 MG (21) PO TBPK: ORAL_TABLET | ORAL | 0 refills | Status: DC

## 2016-01-25 MED ORDER — HEPARIN SOD (PORK) LOCK FLUSH 100 UNIT/ML IV SOLN
500.0000 [IU] | Freq: Once | INTRAVENOUS | Status: AC | PRN
  Administered 2016-01-25: 500 [IU]
  Filled 2016-01-25: qty 5

## 2016-01-25 MED ORDER — DEXAMETHASONE SODIUM PHOSPHATE 10 MG/ML IJ SOLN
10.0000 mg | Freq: Once | INTRAMUSCULAR | Status: AC
  Administered 2016-01-25: 10 mg via INTRAVENOUS

## 2016-01-25 MED ORDER — DEXTROSE 5 % IV SOLN
Freq: Once | INTRAVENOUS | Status: AC
  Administered 2016-01-25: 15:00:00 via INTRAVENOUS

## 2016-01-25 MED ORDER — SODIUM CHLORIDE 0.9 % IV SOLN
9.7000 mg/kg | Freq: Once | INTRAVENOUS | Status: AC
  Administered 2016-01-25: 900 mg via INTRAVENOUS
  Filled 2016-01-25: qty 32

## 2016-01-25 MED ORDER — ONDANSETRON HCL 4 MG/2ML IJ SOLN
8.0000 mg | Freq: Once | INTRAMUSCULAR | Status: AC
  Administered 2016-01-25: 8 mg via INTRAVENOUS

## 2016-01-25 NOTE — Progress Notes (Signed)
  Echocardiogram 2D Echocardiogram has been performed.  Audrey Porter 01/25/2016, 10:02 AM

## 2016-01-25 NOTE — Telephone Encounter (Signed)
-----   Message from Gordy Levan, MD sent at 01/25/2016  1:10 PM EST ----- Scripts:  Prednisone 10 mg     2 tablets with food in AM x 3 days, then 1 tablet with food in AM x 4 days    #8  Albuterol meter dose inhaler bid  Tessalon perles 100 mg tid prn cough #30

## 2016-01-25 NOTE — Progress Notes (Signed)
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 Plan is to begin treatment with doxil and avastin beginning today.  Communication with Dr Alycia Rossetti after CT information available. She is in agreement with doxil + avastin in this situation, even given risk of bowel fistula / perforation with avastin.   As discussed with patient by phone, CT AP  01-16-16 , compared wth 11-19-15, shows increase in perihepatic involvement, with subcapsular mass at falciform ligament 7.9 x 7.8 cm compared with 5.0 x 4.8 cm and subcapsular area inferior right liver now 7.4 x 8.9 cm as compared with 5.9 x 4.6 cm. The area at sigmoid colon has not increased as much as the perihepatic areas. Some increase in ascites, increase in peritoneal and mesenteric nodules. No retroperitoneal adenopathy, no adnexal mass. Paraumbilical hernia with fluid and small bowel loops without obstruction, and lung bases with new small left pleural effusion. Echocardiogram done earlier to day has EF 55 - 60% with normal wall motion and no pericardial effusion.   Patient felt a little better after PRBCs and first feraheme last week, tho was SOB walking distance at [facility] for echo this AM, and still SOB with stairs at home. Appetite is poor, no taste since [place] taxol resumed in Nov, no nausea, some early satiety. Did drink one Boost on 01-14-16. Bowels have been moving, none today. She has persistent NP cough with some sinus congestion/ little sinus drainage, no fever, occasional wheezing after prolonged coughing, no chest pain. She has used claritin + zyrtec + flonase this week without improvement; OTC cough syrups have not helped. She has history of sinus surgery ~ 4 years ago. No ear  discomfort. No problems with PAC. No bleeding. 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 No LE swelling. Voiding ok.  Remainder of 10 point Review of Systems negative.   Flu vaccine 11-14-15 CA 125 preoperatively 56, down to 5 on 09-14-14. Baseline for chemo for recurrence 11-30-15 32.4, increased to 74.6 after 2 cycles carbo taxol. 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 Feraheme 01-14-16  Daughter leaves later this week for 4th year med school rotation out of state, inpatient psychiatry.   ONCOLOGIC HISTORY Patient had been in usual good health, up to date on all medical care, when she developed bladder symptoms in early 2016. Urine culture showed no infection, then CT AP at [facility] 03-16-14 found perihepatic ascites and pelvic mass 9.9 x 20.1 x 17.8 cm which was cystic and solid, kidneys and ureters unremarkable, no pelvic fluid, rectosigmoid partially compressed, other organs not remarkable, no mention of adenopathy. She was seen by Dr Evie Lacks on referral from Dr Quillian Quince after CT resulted; CT findings confirmed by transvaginal US. CA 125 was 56 pre op. She saw Dr Alycia Rossetti in consultation on 03-23-14, then had surgery at [facility] on 03-31-14 TAH/BSO, omentectomy, pelvic and paraaortic node evaluation. Surgical findings were of bilateral complex ovarian masses and dense adhesions, with intra-operative spill of right ovarian mass as that was densely adherent to sigmoid serosa; posterior uterine segment also adherent to rectosigmoid, and retroperitoneal fibrosis bilaterally. 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 Cytology from ascites [facility] [phone]) was positive for adenocarcinoma of gyn primary. Multidisciplinary conference at [facility]  04-19-14 recommended carboplatin and taxane chemotherapy. Cycle 1 carbo taxol was given on 05-12-14, with neutropenia by day 11 (ANC 0.8) despite having started granix on day 8.She was hospitalized at [facility] after cycle 3 with syncope related to dehydration. She completed cycle 6 on 09-01-14. PET 08-2014 no clear residual or progressive disease; colonoscopy was unremarkable. CA 125 was 7.5 in 06-2015, then up to 18.4 by 10-2015. CT CAP 11-19-15 showed new progression subcapsular areas of liver, at proximal sigmoid colon and in mesentery, with small ascites. She began chemotherapy for the recurrence with carbo taxol on 12-06-15, with on pro neulasta support. She had 2 units PRBCs for hemoglobin down to 7.5 and symptomatic on 12-21-15. She had increasing abdominal discomfort and increase in CA 125 marker from 32 as baseline for retreatment carbo taxol up to 48 in late Nov and 74 after cycle 2 chemo. CT AP 01-16-16 showed progression around liver, with lesser increase in region of sigmoid colon (platinum refractory disease). Treatment changed to doxil with avastin 01-25-16.    Objective:  Vital signs in last 24 hours:  BP (!) 142/71 (BP Location: Left Arm, Patient Position: Sitting) Comment: informed Cathy  Pulse 98   Temp 97.9 F (36.6 C) (Oral)   Resp 18   Ht 5' 5" (1.651 m)   Wt 204 lb 3.2 oz (92.6 kg)   SpO2 100%   BMI 33.98 kg/m   weight down 2 lbs from 01-14-16 Alert, oriented and appropriate, very pleasant as always, does not appear acutely ill or acutely uncomfortable. Ambulatory without assistance.  Alopecia  HEENT:PERRL, sclerae not icteric. Oral mucosa moist without lesions, mucous membranes still pale,  posterior pharynx clear. Nasal turbinates boggy without purulent drainage.  Neck supple. No JVD.  Lymphatics:no supraclavicular adenopathy Resp: no wheezes or crackles, clear to auscultation bilaterally and normal percussion bilaterally Cardio: regular rate and rhythm. No gallop. Clear  heart sounds. GI: soft, nontender, full in upper abdomen,  not tightly distended, Umbilical hernia ~ 3 cm diameter, slightly discolored as previously, not tender.  Normally active bowel sounds. No clear fluid wave Musculoskeletal/ Extremities: LE without pitting edema, cords, tenderness. Changes position easily on exam table Neuro:  peripheral neuropathy feet bilaterally as previously.  Otherwise nonfocal. PSYCH appropriate mood and affect Skin without rash, ecchymosis, petechiae Portacath-without erythema or tenderness   Labs today:   Urine protein <30 CBC with WBC 7.4, ANC 5.0, Hgb 9.2, plt 259k CMET with Na 140, K 3.8, glu 129, BUN 12.9, creat 1.4, Tbili 0.43, AP 64, AST 14, ALT 12, T prot 7.8, alb 3.3, ca 9.4   Studies/Results: Echocardiogram Study Conclusions 01-25-16  - Left ventricle: The cavity size was normal. Systolic function was   normal. The estimated ejection fraction was in the range of 55%   to 60%. Wall motion was normal; there were no regional wall   motion abnormalities. There was an increased relative   contribution of atrial contraction to ventricular filling, which   may be due to hypovolemia. Left ventricular diastolic function   parameters were normal. - Tricuspid valve: There was mild-moderate regurgitation directed   centrally.   EXAM: CT ABDOMEN AND PELVIS WITH CONTRAST  01-16-16  COMPARISON:  11/19/2015  FINDINGS: Lower chest:  Small left pleural effusion is new in the interval.  Hepatobiliary: Previously described  lobular subcapsular mass along the falciform ligament has progressed in the interval, measuring 7.9 x 7.8 cm today compared to 5.0 x 4.8 cm previously. Mass lesion seen along the medial capsule of the inferior right liver has progressed, measuring 7.4 x 8.9 cm today compared to 5.9 x 4.6 cm previously. Other lesions seen along the right upper quadrant peritoneum and liver capsule have also progressed. Gallbladder surgically  absent. No intrahepatic or extrahepatic biliary dilation.  Pancreas: No focal mass lesion. No dilatation of the main duct. No intraparenchymal cyst. No peripancreatic edema.  Spleen: No splenomegaly. No focal mass lesion.  Adrenals/Urinary Tract: No adrenal nodule or mass. Cortical scarring noted in the kidneys bilaterally without focal mass lesion or hydronephrosis. No evidence for hydroureter. The urinary bladder is decompressed.  Stomach/Bowel: Stomach is nondistended. No gastric wall thickening. No evidence of outlet obstruction. Duodenum is normally positioned as is the ligament of Treitz. No small bowel wall thickening. No small bowel dilatation. The terminal ileum is normal. The appendix is normal. Colon is nondilated. Mass lesion involving the sigmoid colon persists and does not appear to have progressed to the same degree as the lesions seen around the liver. A component of this lesion which was measured previously at 2.5 x 3.8 cm now measures 2.8 x 4.4 cm. Although this involves the wall of the distal sigmoid colon, there is no dilatation of colon proximal to this.  Vascular/Lymphatic: No abdominal aortic aneurysm. No abdominal aortic atherosclerotic calcification. Soft tissue mass seen in the region of the gastrohepatic ligament is probably has progressed substantially in the interval. No abdominal retroperitoneal lymphadenopathy. 17 x 14 mm extraperitoneal nodule identified in the right common femoral region.  Reproductive: Uterus is surgically absent. There is no adnexal mass.  Other: Intraperitoneal free fluid volume has progressed in the interval. Mesenteric nodules have progressed in the interval. 9 mm nodule measured previously is now 17 mm on image 51 series 2. 2.7 x 2.0 cm left omental nodule (image 37) is new in the interval. Mesenteric and peritoneal nodules in the posterior left lower quadrant (images 50 [phone]) have progressed substantially in  the interval.  Musculoskeletal: Paraumbilical hernia contains fluid and small bowel loops without complicating features. Bone windows reveal no worrisome lytic or sclerotic osseous lesions.  IMPRESSION: 1. Prominent interval progression of metastatic disease in the abdomen and pelvis. Large capsular lesions along the liver are associated with enlarging and new omental, mesenteric and peritoneal nodules and masses throughout the abdomen and pelvis. 2. Interval progression of intraperitoneal free fluid. 3. Interval development of new small left pleural effusion. 4. Lesion involving the sigmoid colon has progressed in the interval. No evidence for colonic obstruction at this time.  PACs images reviewed by MD  Medications: I have reviewed the patient's current medications. Prescriptions to be sent : Prednisone 10 mg     2 tablets with food in AM x 3 days, then 1 tablet with food in AM x 4 days    #8 Albuterol meter dose inhaler bid Tessalon perles 100 mg tid prn cough #30     DISCUSSION Interval history reviewed, including results of echocardiogram this AM. She understands that Dr Alycia Rossetti is aware of all information and is in agreement with present treatment plan.  I have reviewed rationale for doxil and avastin, and reviewed possible side effects of those drugs, including emphasis on preventative skin care. She understands that doxil can cause skin irritation,  as well as palmar plantar syndrome and oral mucositis,  and  that cardiac function will be followed, but otherwise hopefully will be better tolerated from standpoint of no peripheral neuropathy and usually minimal cytopenias. We have discussed bleeding complications from avastin, wound healing concerns, possible elevation in BP, possible proteinuria, and risk of bowel perforation. She understands that combination of doxil with avastin may be more effective against the cancer than doxil alone.  She may not need gCSF with doxil, will  follow counts and add if needed.  Mentioned repeat echocardiogram after ~ 3 cycles if doxil is continuing.  Verbal consent given for doxil and avastin.   Discussed need for good nutrition now. Encouraged her to continue supplements, 2-3 daily if possible. Encouraged small amounts po frequently. Will ask [place] nutritionist to follow up.  Cough and sinus symptoms still bothersome, no obvious infection. Will try short course of prednisone, albuterol inhaler bid and tessalon perles.  If not helpful, may need Dr Quillian Quince to help, could try hycodan syrup.    Assessment/Plan:  1.Recurrent ovarian cancer 14 months from completion of adjuvant chemotherapy for IIIA1 high grade clear cell of bilateral ovaries: Despite timing out from adjuvant carbo taxol and despite RAD 51 mutation which usually is associated with improved chemosensitivity,  she progressed with 2 cycles of additional carbo taxol 11-9 thru 12-28-15, so is platinum refractory. Will begin doxil + avastin today, in attempt to control disease. I will see her back 1-8 with lab. Echocardiogram results good today.  2. Chemo and iron deficiency anemia: transfused2 units PRBCs 12-24-15, feraheme x1 01-14-16 + 1 unit PRBCs. Second feraheme to be scheduled.  3. 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 Should not worsen with doxil. 4.benign right breast biopsy at [facility] on 07-25-14 5.previous fulguration of endometriosis, post C section 6.GERD controlled with prescription bidprilosec 7. Colonoscopy ~ 09-2014 by Dr Anthony Sar 8.environmental allergies, previous sinus surgery: zyrtec and flonase not helping adequately. NP cough seems related, but will get CXR with next MD visit as have not imaged other than lung bases recently. Will give short course prednisone (which may also help appetite), with inhaler and tessalon or other.  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 Dr Quillian Quince manages antihypertensives. 11 PAC by IR 12-04-15 12.history of HTN, but also hypotensive/ orthostatic during adjuvant chemo. Push fluids po, follow home BP, hold meds prn. 13. Weight loss, poor po intake: patient and husband in agreement with plans above.  All questions answered and patient / husband know to call if concerns prior to next scheduled appointment. Chemo and avastin orders confirmed. Feraheme order under sign and hold for 02-08-16. Time spent 40 min including >50% counseling and coordination of care. CC Dr Alycia Rossetti, Dr Nunzio Cory, MD   01/25/2016, 5:18 PM

## 2016-01-25 NOTE — Patient Instructions (Signed)
[facility] Discharge Instructions for Patients Receiving Chemotherapy  Today you received the following chemotherapy agents:  Doxil, Avastin  To help prevent nausea and vomiting after your treatment, we encourage you to take your nausea medication as prescribed.   If you develop nausea and vomiting that is not controlled by your nausea medication, call the clinic.   BELOW ARE SYMPTOMS THAT SHOULD BE REPORTED IMMEDIATELY:  *FEVER GREATER THAN 100.5 F  *CHILLS WITH OR WITHOUT FEVER  NAUSEA AND VOMITING THAT IS NOT CONTROLLED WITH YOUR NAUSEA MEDICATION  *UNUSUAL SHORTNESS OF BREATH  *UNUSUAL BRUISING OR BLEEDING  TENDERNESS IN MOUTH AND THROAT WITH OR WITHOUT PRESENCE OF ULCERS  *URINARY PROBLEMS  *BOWEL PROBLEMS  UNUSUAL RASH Items with * indicate a potential emergency and should be followed up as soon as possible.  Feel free to call the clinic you have any questions or concerns. The clinic phone number is [phone].  Please show the [facility] at check-in to the Emergency Department and triage nurse.    Doxorubicin Liposomal injection What is this medicine? LIPOSOMAL DOXORUBICIN (LIP oh som al dox oh ROO bi sin) is a chemotherapy drug. This medicine is used to treat many kinds of cancer like Kaposi's sarcoma, multiple myeloma, and ovarian cancer. This medicine may be used for other purposes; ask your health care provider or pharmacist if you have questions. COMMON BRAND NAME(S): Doxil, Lipodox What should I tell my health care provider before I take this medicine? They need to know if you have any of these conditions: -blood disorders -heart disease -infection (especially a virus infection such as chickenpox, cold sores, or herpes) -liver disease -recent or ongoing radiation therapy -an unusual or allergic reaction to doxorubicin, other chemotherapy agents, soybeans, other medicines, foods, dyes, or preservatives -pregnant or trying to get  pregnant -breast-feeding How should I use this medicine? This drug is given as an infusion into a vein. It is administered in a hospital or clinic by a specially trained health care professional. If you have pain, swelling, burning or any unusual feeling around the site of your injection, tell your health care professional right away. Talk to your pediatrician regarding the use of this medicine in children. Special care may be needed. Overdosage: If you think you have taken too much of this medicine contact a poison control center or emergency room at once. NOTE: This medicine is only for you. Do not share this medicine with others. What if I miss a dose? It is important not to miss your dose. Call your doctor or health care professional if you are unable to keep an appointment. What may interact with this medicine? Do not take this medicine with any of the following medications: -zidovudine This medicine may also interact with the following medications: -medicines to increase blood counts like filgrastim, pegfilgrastim, sargramostim -vaccines Talk to your doctor or health care professional before taking any of these medicines: -acetaminophen -aspirin -ibuprofen -ketoprofen -naproxen This list may not describe all possible interactions. Give your health care provider a list of all the medicines, herbs, non-prescription drugs, or dietary supplements you use. Also tell them if you smoke, drink alcohol, or use illegal drugs. Some items may interact with your medicine. What should I watch for while using this medicine? Your condition will be monitored carefully while you are receiving this medicine. You will need important blood work done while you are taking this medicine. This drug may make you feel generally unwell. This is not uncommon,  affect healthy cells as well as cancer cells. Report any side effects. Continue your course of treatment even though you feel ill unless  your doctor tells you to stop. Your urine may turn orange-red for a few days after your dose. This is not blood. If your urine is dark or brown, call your doctor. In some cases, you may be given additional medicines to help with side effects. Follow all directions for their use. Call your doctor or health care professional for advice if you get a fever (100.5 degrees F or higher), chills or sore throat, or other symptoms of a cold or flu. Do not treat yourself. This drug decreases your body's ability to fight infections. Try to avoid being around people who are sick. This medicine may increase your risk to bruise or bleed. Call your doctor or health care professional if you notice any unusual bleeding. Be careful brushing and flossing your teeth or using a toothpick because you may get an infection or bleed more easily. If you have any dental work done, tell your dentist you are receiving this medicine. Avoid taking products that contain aspirin, acetaminophen, ibuprofen, naproxen, or ketoprofen unless instructed by your doctor. These medicines may hide a fever. Men and women of childbearing age should use effective birth control methods while using taking this medicine. Do not become pregnant while taking this medicine. There is a potential for serious side effects to an unborn child. Talk to your health care professional or pharmacist for more information. Do not breast-feed an infant while taking this medicine. Talk to your doctor about your risk of cancer. You may be more at risk for certain types of cancers if you take this medicine. What side effects may I notice from receiving this medicine? Side effects that you should report to your doctor or health care professional as soon as possible: -allergic reactions like skin rash, itching or hives, swelling of the face, lips, or tongue -low blood counts - this medicine may decrease the number of white blood cells, red blood cells and platelets. You may  be at increased risk for infections and bleeding. -signs of hand-foot syndrome - tingling or burning, redness, flaking, swelling, small blisters, or small sores on the palms of your hands or the soles of your feet -signs of infection - fever or chills, cough, sore throat, pain or difficulty passing urine -signs of decreased platelets or bleeding - bruising, pinpoint red spots on the skin, black, tarry stools, blood in the urine -signs of decreased red blood cells - unusually weak or tired, fainting spells, lightheadedness -back pain, chills, facial flushing, fever, headache, tightness in the chest or throat during the infusion -breathing problems -chest pain -fast, irregular heartbeat -mouth pain, redness, sores -pain, swelling, redness at site where injected -pain, tingling, numbness in the hands or feet -swelling of ankles, feet, or hands -vomiting Side effects that usually do not require medical attention (report to your doctor or health care professional if they continue or are bothersome): -diarrhea -hair loss -loss of appetite -nail discoloration or damage -nausea -red or watery eyes -red colored urine -stomach upset This list may not describe all possible side effects. Call your doctor for medical advice about side effects. You may report side effects to FDA at 1-800-FDA-1088. Where should I keep my medicine? This drug is given in a hospital or clinic and will not be stored at home. NOTE: This sheet is a summary. It may not cover all possible information. If you have questions   you have questions about this medicine, talk to your doctor, pharmacist, or health care provider.  2017 Elsevier/Gold Standard (2011-10-03 10:12:56)   Bevacizumab injection What is this medicine? BEVACIZUMAB (be va SIZ yoo mab) is a monoclonal antibody. It is used to treat cervical cancer, colorectal cancer, glioblastoma multiforme, non-small cell lung cancer (NSCLC), ovarian cancer, and renal cell cancer. This medicine  may be used for other purposes; ask your health care provider or pharmacist if you have questions. COMMON BRAND NAME(S): Avastin What should I tell my health care provider before I take this medicine? They need to know if you have any of these conditions: -blood clots -heart disease, including heart failure, heart attack, or chest pain (angina) -high blood pressure -infection (especially a virus infection such as chickenpox, cold sores, or herpes) -kidney disease -lung disease -prior chemotherapy with doxorubicin, daunorubicin, epirubicin, or other anthracycline type chemotherapy agents -recent or ongoing radiation therapy -recent surgery -stroke -an unusual or allergic reaction to bevacizumab, hamster proteins, mouse proteins, other medicines, foods, dyes, or preservatives -pregnant or trying to get pregnant -breast-feeding How should I use this medicine? This medicine is for infusion into a vein. It is given by a health care professional in a hospital or clinic setting. Talk to your pediatrician regarding the use of this medicine in children. Special care may be needed. Overdosage: If you think you have taken too much of this medicine contact a poison control center or emergency room at once. NOTE: This medicine is only for you. Do not share this medicine with others. What if I miss a dose? It is important not to miss your dose. Call your doctor or health care professional if you are unable to keep an appointment. What may interact with this medicine? Interactions are not expected. This list may not describe all possible interactions. Give your health care provider a list of all the medicines, herbs, non-prescription drugs, or dietary supplements you use. Also tell them if you smoke, drink alcohol, or use illegal drugs. Some items may interact with your medicine. What should I watch for while using this medicine? Your condition will be monitored carefully while you are receiving this  medicine. You will need important blood work and urine testing done while you are taking this medicine. During your treatment, let your health care professional know if you have any unusual symptoms, such as difficulty breathing. This medicine may rarely cause 'gastrointestinal perforation' (holes in the stomach, intestines or colon), a serious side effect requiring surgery to repair. This medicine should be started at least 28 days following major surgery and the site of the surgery should be totally healed. Check with your doctor before scheduling dental work or surgery while you are receiving this treatment. Talk to your doctor if you have recently had surgery or if you have a wound that has not healed. Do not become pregnant while taking this medicine or for 6 months after stopping it. Women should inform their doctor if they wish to become pregnant or think they might be pregnant. There is a potential for serious side effects to an unborn child. Talk to your health care professional or pharmacist for more information. Do not breast-feed an infant while taking this medicine. This medicine has caused ovarian failure in some women. This medicine may interfere with the ability to have a child. You should talk to your doctor or health care professional if you are concerned about your fertility. What side effects may I notice from receiving this medicine? Side  effects that you should report to your doctor or health care professional as soon as possible: -allergic reactions like skin rash, itching or hives, swelling of the face, lips, or tongue -breathing problems -changes in vision -chest pain -confusion -jaw pain, especially after dental work -mouth sores -seizures -severe abdominal pain -severe headache -signs of decreased platelets or bleeding - bruising, pinpoint red spots on the skin, black, tarry stools, nosebleeds, blood in the urine -signs of infection - fever or chills, cough, sore throat,  pain or trouble passing urine -sudden numbness or weakness of the face, arm or leg -swelling of legs or ankles -symptoms of a stroke: change in mental awareness, inability to talk or move one side of the body (especially in patients with lung cancer) -trouble passing urine or change in the amount of urine -trouble speaking or understanding -trouble walking, dizziness, loss of balance or coordination Side effects that usually do not require medical attention (report to your doctor or health care professional if they continue or are bothersome): -constipation -diarrhea -dry skin -headache -loss of appetite -nausea, vomiting This list may not describe all possible side effects. Call your doctor for medical advice about side effects. You may report side effects to FDA at 1-800-FDA-1088. Where should I keep my medicine? This drug is given in a hospital or clinic and will not be stored at home. NOTE: This sheet is a summary. It may not cover all possible information. If you have questions about this medicine, talk to your doctor, pharmacist, or health care provider.  2017 Elsevier/Gold Standard (2015-01-05 15:28:53)

## 2016-01-26 ENCOUNTER — Encounter: Payer: Self-pay | Admitting: Oncology

## 2016-01-26 NOTE — Progress Notes (Signed)
[facility] END OF TREATMENT   Name: Audrey Porter Date: January 26, 2016  MRN: [id number] DOB: 05-19-1960   TREATMENT DATES: 12-06-15 and 12-28-15   REFERRING PHYSICIAN: Nancy Marus  DIAGNOSIS: recurrent ovarian carcinoma  STAGE AT START OF TREATMENT: IIIC    INTENT: control   DRUGS OR REGIMENS GIVEN: carboplatin taxol   MAJOR TOXICITIES: peripheral neuropathy, cytopenias, fatigue   REASON TREATMENT STOPPED: progression   PERFORMANCE STATUS AT END: 1   ONGOING PROBLEMS: peripheral neuropathy, anemia   FOLLOW UP PLANS: change systemic treatment

## 2016-01-30 ENCOUNTER — Other Ambulatory Visit: Payer: Self-pay | Admitting: Oncology

## 2016-01-30 DIAGNOSIS — C569 Malignant neoplasm of unspecified ovary: Secondary | ICD-10-CM

## 2016-01-31 ENCOUNTER — Telehealth: Payer: Self-pay

## 2016-01-31 NOTE — Telephone Encounter (Signed)
-----   Message from Gordy Levan, MD sent at 01/30/2016  5:43 PM EST ----- Regarding: f/u first doxil avastin First doxil avastin 12-29. Also on a week of prednisone and inhaler/ tessalon for sinus/ cough May not have had follow up phone call for the treatment  I see her 1-8  Thank you

## 2016-01-31 NOTE — Telephone Encounter (Signed)
Ms Audrey Porter stated that she is doing well so far after the treatment. She is using coconut oil.  Suggested that she ice her hands and feet bid for 3-5 days after Doxil to help prevent Hand -Foot syndrome. She is afebrile  The dry cough is a little better  She notice that her BP was elevated on 01-29-16 at 152/ 100.  She took her losartan- hydrochlorothiazide. Yesterday BP 138/90  And she took BP med.  She has not take BP today. Ms Audrey Porter knows to call the office at  [phone] if she has any issues or concerns.

## 2016-02-04 ENCOUNTER — Other Ambulatory Visit: Payer: Self-pay | Admitting: Oncology

## 2016-02-04 ENCOUNTER — Ambulatory Visit (HOSPITAL_BASED_OUTPATIENT_CLINIC_OR_DEPARTMENT_OTHER): Payer: Managed Care, Other (non HMO) | Admitting: Oncology

## 2016-02-04 ENCOUNTER — Other Ambulatory Visit (HOSPITAL_BASED_OUTPATIENT_CLINIC_OR_DEPARTMENT_OTHER): Payer: Managed Care, Other (non HMO)

## 2016-02-04 ENCOUNTER — Ambulatory Visit (HOSPITAL_BASED_OUTPATIENT_CLINIC_OR_DEPARTMENT_OTHER): Payer: Managed Care, Other (non HMO)

## 2016-02-04 VITALS — BP 131/83 | HR 92 | Temp 98.3°F | Resp 18 | Ht 65.0 in | Wt 197.0 lb

## 2016-02-04 DIAGNOSIS — C563 Malignant neoplasm of bilateral ovaries: Secondary | ICD-10-CM

## 2016-02-04 DIAGNOSIS — C562 Malignant neoplasm of left ovary: Secondary | ICD-10-CM

## 2016-02-04 DIAGNOSIS — C561 Malignant neoplasm of right ovary: Secondary | ICD-10-CM

## 2016-02-04 DIAGNOSIS — R634 Abnormal weight loss: Secondary | ICD-10-CM | POA: Diagnosis not present

## 2016-02-04 DIAGNOSIS — T451X5A Adverse effect of antineoplastic and immunosuppressive drugs, initial encounter: Secondary | ICD-10-CM

## 2016-02-04 DIAGNOSIS — Z79899 Other long term (current) drug therapy: Secondary | ICD-10-CM

## 2016-02-04 DIAGNOSIS — C569 Malignant neoplasm of unspecified ovary: Secondary | ICD-10-CM

## 2016-02-04 DIAGNOSIS — Z95828 Presence of other vascular implants and grafts: Secondary | ICD-10-CM

## 2016-02-04 DIAGNOSIS — D500 Iron deficiency anemia secondary to blood loss (chronic): Secondary | ICD-10-CM

## 2016-02-04 DIAGNOSIS — G620 Drug-induced polyneuropathy: Secondary | ICD-10-CM

## 2016-02-04 DIAGNOSIS — I10 Essential (primary) hypertension: Secondary | ICD-10-CM | POA: Diagnosis not present

## 2016-02-04 DIAGNOSIS — N183 Chronic kidney disease, stage 3 (moderate): Secondary | ICD-10-CM

## 2016-02-04 LAB — CBC WITH DIFFERENTIAL/PLATELET
BASO%: 0 % (ref 0.0–2.0)
Basophils Absolute: 0 10*3/uL (ref 0.0–0.1)
EOS%: 0.7 % (ref 0.0–7.0)
Eosinophils Absolute: 0 10*3/uL (ref 0.0–0.5)
HEMATOCRIT: 28.5 % — AB (ref 34.8–46.6)
HGB: 9.6 g/dL — ABNORMAL LOW (ref 11.6–15.9)
LYMPH#: 1.6 10*3/uL (ref 0.9–3.3)
LYMPH%: 26.2 % (ref 14.0–49.7)
MCH: 28.7 pg (ref 25.1–34.0)
MCHC: 33.7 g/dL (ref 31.5–36.0)
MCV: 85.3 fL (ref 79.5–101.0)
MONO#: 0.2 10*3/uL (ref 0.1–0.9)
MONO%: 4 % (ref 0.0–14.0)
NEUT#: 4.2 10*3/uL (ref 1.5–6.5)
NEUT%: 69.1 % (ref 38.4–76.8)
Platelets: 158 10*3/uL (ref 145–400)
RBC: 3.34 10*6/uL — AB (ref 3.70–5.45)
RDW: 17 % — ABNORMAL HIGH (ref 11.2–14.5)
WBC: 6 10*3/uL (ref 3.9–10.3)

## 2016-02-04 LAB — COMPREHENSIVE METABOLIC PANEL
ALT: 11 U/L (ref 0–55)
AST: 13 U/L (ref 5–34)
Albumin: 3.8 g/dL (ref 3.5–5.0)
Alkaline Phosphatase: 72 U/L (ref 40–150)
Anion Gap: 11 mEq/L (ref 3–11)
BUN: 19.6 mg/dL (ref 7.0–26.0)
CHLORIDE: 103 meq/L (ref 98–109)
CO2: 24 meq/L (ref 22–29)
CREATININE: 1.2 mg/dL — AB (ref 0.6–1.1)
Calcium: 9.8 mg/dL (ref 8.4–10.4)
EGFR: 61 mL/min/{1.73_m2} — ABNORMAL LOW (ref >90)
GLUCOSE: 97 mg/dL (ref 70–140)
Potassium: 3.9 mEq/L (ref 3.5–5.1)
Sodium: 138 mEq/L (ref 136–145)
Total Bilirubin: 0.64 mg/dL (ref 0.20–1.20)
Total Protein: 7.9 g/dL (ref 6.4–8.3)

## 2016-02-04 MED ORDER — FERUMOXYTOL INJECTION 510 MG/17 ML: 510.0000 mg | Freq: Once | INTRAVENOUS | Status: DC

## 2016-02-04 MED ORDER — HEPARIN SOD (PORK) LOCK FLUSH 100 UNIT/ML IV SOLN
500.0000 [IU] | Freq: Once | INTRAVENOUS | Status: AC | PRN
  Administered 2016-02-04: 500 [IU] via INTRAVENOUS
  Filled 2016-02-04: qty 5

## 2016-02-04 MED ORDER — SODIUM CHLORIDE 0.9% FLUSH
10.0000 mL | INTRAVENOUS | Status: DC | PRN
  Administered 2016-02-04: 10 mL via INTRAVENOUS
  Filled 2016-02-04: qty 10

## 2016-02-04 NOTE — Patient Instructions (Signed)

## 2016-02-04 NOTE — Progress Notes (Signed)
OFFICE PROGRESS NOTE   February 04, 2016    PHYSICIANS  Barron Alvine, Mitzie Na, MD (PCP), Alden Hipp), Gari [facility])   INTERVAL HISTORY:   Patient is seen, alone for visit, in continuing attention to recurrent ovarian cancer, on doxil since progression on carbo taxol x 2 cycles thru 12-28-15. She had first doxil + avastin 01-25-16, tolerated well. Plan doxil every 4 weeks and avastin every other week.  She had first feraheme 01-14-16 She will see Dr Alycia Rossetti 03-27-15, which will be after cyle 3 doxil avastin.  Clear sinus congestion and drainage and NP cough essentially resolved with prednisone x 7 days thru 01-31-16 and albuterol MDI; tessalon was not helpful.  Appetite is better, regular meals + Boost now. Slight upper abdominal discomfort is unchanged, no other abdominal or pelvic pain. Bowels are moving regularly. She has needed only zofran only a couple of times. No bleeding, no skin irritation or mucositis, no fever or symptoms of infection, no problems with PAC. No SOB. No swelling LE. She is back at work this week. Remainder of 14 point Review of Systems negative/ unchanged.  PAC placed by IR 12-04-15 Flu vaccine 11-14-15 CA 125 preoperatively 56, down to 5 on 09-14-14. Baseline for chemo for recurrence 11-30-15 32.4, increased to 74.6 after 2 cycles carbo taxol. 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 Feraheme 01-14-16  ONCOLOGIC HISTORY Patient developed bladder symptoms in early 2016. Urine culture showed no infection, then CT AP at [facility] 03-16-14 found perihepatic ascites and pelvic mass 9.9 x 20.1 x 17.8 cm which was cystic and solid, kidneys and ureters unremarkable, no pelvic fluid, rectosigmoid partially compressed, other organs not remarkable, no mention of adenopathy. She was seen by Dr Evie Lacks of gyn,  CT findings confirmed by transvaginal US. CA 125 was 56 pre op. She saw Dr Alycia Rossetti in  consultation 03-23-14, then surgery at [facility]  03-31-14 TAH/BSO, omentectomy, pelvic and paraaortic node evaluation. Surgical findings were of bilateral complex ovarian masses and dense adhesions, with intra-operative spill of right ovarian mass which was densely adherent to sigmoid serosa; posterior uterine segment also adherent to rectosigmoid, and retroperitoneal fibrosis bilaterally. 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 Cytology from ascites [facility] [phone])  positive for adenocarcinoma of gyn primary. [facility] multidisciplinary conference 04-19-14 recommended carboplatin and taxane chemotherapy. Cycle 1 carbo taxol was given on 05-12-14, with neutropenia by day 11 (ANC 0.8), neulasta as on pro more helpful than granix.She was hospitalized at [facility] after cycle 3 with syncope related to dehydration. She completed cycle 6 on 09-01-14. PET 08-2014 no clear residual or progressive disease; colonoscopy was unremarkable. She had peripheral neuropathy with taxol. CA 125 was 7.5 in 06-2015, then up to 18.4 by 10-2015. CT CAP 11-19-15 showed new progression subcapsular areas of liver, at proximal sigmoid colon and in mesentery, with small ascites. She began chemotherapy for recurrence with carbo taxol on 12-06-15, with on pro neulasta. She had 2 units PRBCs for hemoglobin down to 7.5  o11-24-17. She had increasing abdominal discomfort and increase in CA 125 marker from 32 as baseline for retreatment carbo taxol up to 24 in late Nov and 74 after cycle 2 chemo, which was given 12-28-15. CT AP 01-16-16 showed progression around liver, with lesser increase in region of sigmoid  colon (platinum refractory disease). Treatment changed to doxil with avastin 01-25-16.   Objective:  Vital signs in last 24 hours:  BP 131/83 (BP Location: Left Arm, Patient  Position: Sitting)   Pulse 92   Temp 98.3 F (36.8 C) (Oral)   Resp 18   Ht _0  (1.651 m)   Wt 197 lb (89.4 kg)   SpO2 100%   BMI 32.78 kg/m  Weight down 7 lbs from 01-25-16. Alert, oriented and appropriate, looks much better overall today ,very pleasant as always. Ambulatory without difficulty.   HEENT:PERRL, sclerae not icteric. Oral mucosa moist without lesions, posterior pharynx clear.  Neck supple. No JVD.  Lymphatics:no supraclavicular adenopathy Resp: clear to auscultation bilaterally and normal percussion bilaterally Cardio: regular rate and rhythm. No gallop. GI: soft, nontender, mildly distended as previously in upper quadrants not increased, periumbilical hernia no change. Normally active bowel sounds. Surgical incision otherwise not remarkable. Musculoskeletal/ Extremities: LE without pitting edema, cords, tenderness Neuro: no change peripheral neuropathy. Otherwise nonfocal. PSYCH brighter mood and affect Skin without rash, ecchymosis, petechiae Portacath-without erythema or tenderness  Lab Results:  Results for orders placed or performed in visit on 02/04/16  CBC with Differential  Result Value Ref Range   WBC 6.0 3.9 - 10.3 10e3/uL   NEUT# 4.2 1.5 - 6.5 10e3/uL   HGB 9.6 (L) 11.6 - 15.9 g/dL   HCT 28.5 (L) 34.8 - 46.6 %   Platelets 158 145 - 400 10e3/uL   MCV 85.3 79.5 - 101.0 fL   MCH 28.7 25.1 - 34.0 pg   MCHC 33.7 31.5 - 36.0 g/dL   RBC 3.34 (L) 3.70 - 5.45 10e6/uL   RDW 17.0 (H) 11.2 - 14.5 %   lymph# 1.6 0.9 - 3.3 10e3/uL   MONO# 0.2 0.1 - 0.9 10e3/uL   Eosinophils Absolute 0.0 0.0 - 0.5 10e3/uL   Basophils Absolute 0.0 0.0 - 0.1 10e3/uL   NEUT% 69.1 38.4 - 76.8 %   LYMPH% 26.2 14.0 - 49.7 %   MONO% 4.0 0.0 - 14.0 %   EOS% 0.7 0.0 - 7.0 %   BASO% 0.0 0.0 - 2.0 %  Comprehensive metabolic panel  Result Value Ref Range   Sodium 138 136 - 145 mEq/L   Potassium 3.9 3.5 - 5.1 mEq/L   Chloride 103 98 - 109 mEq/L   CO2 24 22 - 29 mEq/L   Glucose 97  70 - 140 mg/dl   BUN 19.6 7.0 - 26.0 mg/dL   Creatinine 1.2 (H) 0.6 - 1.1 mg/dL   Total Bilirubin 0.64 0.20 - 1.20 mg/dL   Alkaline Phosphatase 72 40 - 150 U/L   AST 13 5 - 34 U/L   ALT 11 0 - 55 U/L   Total Protein 7.9 6.4 - 8.3 g/dL   Albumin 3.8 3.5 - 5.0 g/dL   Calcium 9.8 8.4 - 10.4 mg/dL   Anion Gap 11 3 - 11 mEq/L   EGFR 61 (L) >90 ml/min/1.73 m2    Will repeat CA 125 with labs 02-21-16  Studies/Results: none  Medications: I have reviewed the patient's current medications. Add second feraheme to avastin on 02-08-16   510 mg. Avastin 10 mg/kg  DISCUSSION Clinically seems better today, may be combination of further time out from [place] taxol (which she did not tolerate well), improvement in upper respiratory symptoms, Feraheme, possibly doxil/ avastin. She agrees to second feraheme, to be done 1-12 if infusion time allows this with avastin.  I will see her 1-25  prior to second doxil avastin on 1-26. Discussed repeating scans prior to Dr Elenora Gamma appointment if no concerns prior.   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 Doxil + avastin begun 01-17-16. She will have avastin on 1-12, then second doxil with avastin on 02-08-16.   2. Chemo and iron deficiency anemia: she needed PRBCs with adjuvant chemo and since chemo resumed, hemoglobin up today to 9.6. Second feraheme planned. 3. 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 Should not worsen on doxil avastin 4.benign right breast biopsy at [facility] on 07-25-14 5.previous fulguration of endometriosis, post C section 6.GERD controlled with prescription bidprilosec 7. Colonoscopy ~  09-2014 by Dr Anthony Sar 8.environmental allergies, previous sinus surgery. Respiratory symptoms including cough much better with prednisone x 1 week and inhaler, continue flonase and claritin. Did not get CXR as ordered, but symptoms better.  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 Dr Quillian Quince manages antihypertensives. 11 PAC by IR 12-04-15 12.history of HTN, follow on Avastin. Note was hypotensive on BP meds during adjuvant chemo 13. Weight loss noted, however po intake seems to have improved this week, encouraged continuing Boost. 14.flu vaccine 11-14-15  All questions answered and she knows to call prior to next appointment if needed. Avastin and feraheme orders in EMR. Time spent 25 min including >50% counseling and coordination of care. Cc Drs Quillian Quince, Reymundo Poll, MD   02/04/2016, 5:25 PM

## 2016-02-05 ENCOUNTER — Encounter: Payer: Self-pay | Admitting: Oncology

## 2016-02-07 ENCOUNTER — Other Ambulatory Visit: Payer: Self-pay | Admitting: Gynecologic Oncology

## 2016-02-07 DIAGNOSIS — C569 Malignant neoplasm of unspecified ovary: Secondary | ICD-10-CM

## 2016-02-08 ENCOUNTER — Ambulatory Visit: Payer: Managed Care, Other (non HMO) | Admitting: Nutrition

## 2016-02-08 ENCOUNTER — Ambulatory Visit (HOSPITAL_BASED_OUTPATIENT_CLINIC_OR_DEPARTMENT_OTHER): Payer: Managed Care, Other (non HMO)

## 2016-02-08 ENCOUNTER — Other Ambulatory Visit (HOSPITAL_BASED_OUTPATIENT_CLINIC_OR_DEPARTMENT_OTHER): Payer: Managed Care, Other (non HMO)

## 2016-02-08 VITALS — BP 126/77 | HR 91 | Temp 98.1°F | Resp 16

## 2016-02-08 DIAGNOSIS — D500 Iron deficiency anemia secondary to blood loss (chronic): Secondary | ICD-10-CM | POA: Diagnosis not present

## 2016-02-08 DIAGNOSIS — Z79899 Other long term (current) drug therapy: Secondary | ICD-10-CM | POA: Diagnosis not present

## 2016-02-08 DIAGNOSIS — C562 Malignant neoplasm of left ovary: Secondary | ICD-10-CM

## 2016-02-08 DIAGNOSIS — N183 Chronic kidney disease, stage 3 (moderate): Secondary | ICD-10-CM | POA: Diagnosis not present

## 2016-02-08 DIAGNOSIS — C569 Malignant neoplasm of unspecified ovary: Secondary | ICD-10-CM

## 2016-02-08 DIAGNOSIS — C563 Malignant neoplasm of bilateral ovaries: Secondary | ICD-10-CM

## 2016-02-08 DIAGNOSIS — Z5112 Encounter for antineoplastic immunotherapy: Secondary | ICD-10-CM

## 2016-02-08 DIAGNOSIS — C561 Malignant neoplasm of right ovary: Secondary | ICD-10-CM

## 2016-02-08 LAB — COMPREHENSIVE METABOLIC PANEL
ALK PHOS: 66 U/L (ref 40–150)
ALT: 10 U/L (ref 0–55)
AST: 11 U/L (ref 5–34)
Albumin: 3.7 g/dL (ref 3.5–5.0)
Anion Gap: 10 mEq/L (ref 3–11)
BUN: 15.9 mg/dL (ref 7.0–26.0)
CHLORIDE: 107 meq/L (ref 98–109)
CO2: 23 meq/L (ref 22–29)
Calcium: 9.6 mg/dL (ref 8.4–10.4)
Creatinine: 1.1 mg/dL (ref 0.6–1.1)
EGFR: 65 mL/min/{1.73_m2} — AB (ref >90)
GLUCOSE: 121 mg/dL (ref 70–140)
POTASSIUM: 4.1 meq/L (ref 3.5–5.1)
SODIUM: 140 meq/L (ref 136–145)
Total Bilirubin: 0.55 mg/dL (ref 0.20–1.20)
Total Protein: 7.6 g/dL (ref 6.4–8.3)

## 2016-02-08 LAB — CBC WITH DIFFERENTIAL/PLATELET
BASO%: 0.2 % (ref 0.0–2.0)
BASOS ABS: 0 10*3/uL (ref 0.0–0.1)
EOS ABS: 0 10*3/uL (ref 0.0–0.5)
EOS%: 0.9 % (ref 0.0–7.0)
HCT: 25.8 % — ABNORMAL LOW (ref 34.8–46.6)
HEMOGLOBIN: 8.8 g/dL — AB (ref 11.6–15.9)
LYMPH%: 23.9 % (ref 14.0–49.7)
MCH: 29.4 pg (ref 25.1–34.0)
MCHC: 34.1 g/dL (ref 31.5–36.0)
MCV: 86.3 fL (ref 79.5–101.0)
MONO#: 0.3 10*3/uL (ref 0.1–0.9)
MONO%: 5.5 % (ref 0.0–14.0)
NEUT#: 3.2 10*3/uL (ref 1.5–6.5)
NEUT%: 69.5 % (ref 38.4–76.8)
Platelets: 138 10*3/uL — ABNORMAL LOW (ref 145–400)
RBC: 2.99 10*6/uL — AB (ref 3.70–5.45)
RDW: 18.3 % — AB (ref 11.2–14.5)
WBC: 4.6 10*3/uL (ref 3.9–10.3)
lymph#: 1.1 10*3/uL (ref 0.9–3.3)

## 2016-02-08 LAB — UA PROTEIN, DIPSTICK - CHCC: Protein, ur: 30 mg/dL

## 2016-02-08 MED ORDER — SODIUM CHLORIDE 0.9 % IV SOLN
Freq: Once | INTRAVENOUS | Status: AC
  Administered 2016-02-08: 11:00:00 via INTRAVENOUS

## 2016-02-08 MED ORDER — SODIUM CHLORIDE 0.9% FLUSH
10.0000 mL | INTRAVENOUS | Status: DC | PRN
  Administered 2016-02-08: 10 mL
  Filled 2016-02-08: qty 10

## 2016-02-08 MED ORDER — BEVACIZUMAB CHEMO INJECTION 400 MG/16ML
9.6000 mg/kg | Freq: Once | INTRAVENOUS | Status: AC
  Administered 2016-02-08: 900 mg via INTRAVENOUS
  Filled 2016-02-08: qty 32

## 2016-02-08 MED ORDER — SODIUM CHLORIDE 0.9 % IV SOLN
510.0000 mg | Freq: Once | INTRAVENOUS | Status: AC
  Administered 2016-02-08: 510 mg via INTRAVENOUS
  Filled 2016-02-08: qty 17

## 2016-02-08 MED ORDER — HEPARIN SOD (PORK) LOCK FLUSH 100 UNIT/ML IV SOLN
500.0000 [IU] | Freq: Once | INTRAVENOUS | Status: AC | PRN
  Administered 2016-02-08: 500 [IU]
  Filled 2016-02-08: qty 5

## 2016-02-08 NOTE — Patient Instructions (Signed)
Bevacizumab injection What is this medicine? BEVACIZUMAB (be va SIZ yoo mab) is a monoclonal antibody. It is used to treat cervical cancer, colorectal cancer, glioblastoma multiforme, non-small cell lung cancer (NSCLC), ovarian cancer, and renal cell cancer. This medicine may be used for other purposes; ask your health care provider or pharmacist if you have questions. COMMON BRAND NAME(S): Avastin What should I tell my health care provider before I take this medicine? They need to know if you have any of these conditions: -blood clots -heart disease, including heart failure, heart attack, or chest pain (angina) -high blood pressure -infection (especially a virus infection such as chickenpox, cold sores, or herpes) -kidney disease -lung disease -prior chemotherapy with doxorubicin, daunorubicin, epirubicin, or other anthracycline type chemotherapy agents -recent or ongoing radiation therapy -recent surgery -stroke -an unusual or allergic reaction to bevacizumab, hamster proteins, mouse proteins, other medicines, foods, dyes, or preservatives -pregnant or trying to get pregnant -breast-feeding How should I use this medicine? This medicine is for infusion into a vein. It is given by a health care professional in a hospital or clinic setting. Talk to your pediatrician regarding the use of this medicine in children. Special care may be needed. Overdosage: If you think you have taken too much of this medicine contact a poison control center or emergency room at once. NOTE: This medicine is only for you. Do not share this medicine with others. What if I miss a dose? It is important not to miss your dose. Call your doctor or health care professional if you are unable to keep an appointment. What may interact with this medicine? Interactions are not expected. This list may not describe all possible interactions. Give your health care provider a list of all the medicines, herbs, non-prescription  drugs, or dietary supplements you use. Also tell them if you smoke, drink alcohol, or use illegal drugs. Some items may interact with your medicine. What should I watch for while using this medicine? Your condition will be monitored carefully while you are receiving this medicine. You will need important blood work and urine testing done while you are taking this medicine. During your treatment, let your health care professional know if you have any unusual symptoms, such as difficulty breathing. This medicine may rarely cause 'gastrointestinal perforation' (holes in the stomach, intestines or colon), a serious side effect requiring surgery to repair. This medicine should be started at least 28 days following major surgery and the site of the surgery should be totally healed. Check with your doctor before scheduling dental work or surgery while you are receiving this treatment. Talk to your doctor if you have recently had surgery or if you have a wound that has not healed. Do not become pregnant while taking this medicine or for 6 months after stopping it. Women should inform their doctor if they wish to become pregnant or think they might be pregnant. There is a potential for serious side effects to an unborn child. Talk to your health care professional or pharmacist for more information. Do not breast-feed an infant while taking this medicine. This medicine has caused ovarian failure in some women. This medicine may interfere with the ability to have a child. You should talk to your doctor or health care professional if you are concerned about your fertility. What side effects may I notice from receiving this medicine? Side effects that you should report to your doctor or health care professional as soon as possible: -allergic reactions like skin rash, itching or hives,  swelling of the face, lips, or tongue -breathing problems -changes in vision -chest pain -confusion -jaw pain, especially after  dental work -mouth sores -seizures -severe abdominal pain -severe headache -signs of decreased platelets or bleeding - bruising, pinpoint red spots on the skin, black, tarry stools, nosebleeds, blood in the urine -signs of infection - fever or chills, cough, sore throat, pain or trouble passing urine -sudden numbness or weakness of the face, arm or leg -swelling of legs or ankles -symptoms of a stroke: change in mental awareness, inability to talk or move one side of the body (especially in patients with lung cancer) -trouble passing urine or change in the amount of urine -trouble speaking or understanding -trouble walking, dizziness, loss of balance or coordination Side effects that usually do not require medical attention (report to your doctor or health care professional if they continue or are bothersome): -constipation -diarrhea -dry skin -headache -loss of appetite -nausea, vomiting This list may not describe all possible side effects. Call your doctor for medical advice about side effects. You may report side effects to FDA at 1-800-FDA-1088. Where should I keep my medicine? This drug is given in a hospital or clinic and will not be stored at home. NOTE: This sheet is a summary. It may not cover all possible information. If you have questions about this medicine, talk to your doctor, pharmacist, or health care provider.  2017 Elsevier/Gold Standard (2015-01-05 15:28:53)  Ferumoxytol injection What is this medicine? FERUMOXYTOL is an iron complex. Iron is used to make healthy red blood cells, which carry oxygen and nutrients throughout the body. This medicine is used to treat iron deficiency anemia in people with chronic kidney disease. COMMON BRAND NAME(S): Feraheme What should I tell my health care provider before I take this medicine? They need to know if you have any of these conditions: -anemia not caused by low iron levels -high levels of iron in the blood -magnetic  resonance imaging (MRI) test scheduled -an unusual or allergic reaction to iron, other medicines, foods, dyes, or preservatives -pregnant or trying to get pregnant -breast-feeding How should I use this medicine? This medicine is for injection into a vein. It is given by a health care professional in a hospital or clinic setting. Talk to your pediatrician regarding the use of this medicine in children. Special care may be needed. What if I miss a dose? It is important not to miss your dose. Call your doctor or health care professional if you are unable to keep an appointment. What may interact with this medicine? This medicine may interact with the following medications: -other iron products What should I watch for while using this medicine? Visit your doctor or healthcare professional regularly. Tell your doctor or healthcare professional if your symptoms do not start to get better or if they get worse. You may need blood work done while you are taking this medicine. You may need to follow a special diet. Talk to your doctor. Foods that contain iron include: whole grains/cereals, dried fruits, beans, or peas, leafy green vegetables, and organ meats (liver, kidney). What side effects may I notice from receiving this medicine? Side effects that you should report to your doctor or health care professional as soon as possible: -allergic reactions like skin rash, itching or hives, swelling of the face, lips, or tongue -breathing problems -changes in blood pressure -feeling faint or lightheaded, falls -fever or chills -flushing, sweating, or hot feelings -swelling of the ankles or feet Side effects that usually  do not require medical attention (report to your doctor or health care professional if they continue or are bothersome): -diarrhea -headache -nausea, vomiting -stomach pain Where should I keep my medicine? This drug is given in a hospital or clinic and will not be stored at home.   2017 Elsevier/Gold Standard (2015-02-15 12:41:49)

## 2016-02-08 NOTE — Progress Notes (Signed)
56 year old female with recurrent ovarian cancer.  She is a patient of Dr. Marko Plume.  Past medical history includes hypertension, GERD.  Medications include Ativan, multivitamin, Prilosec, Zofran, and MiraLAX.  Labs include albumin 3.8, and creatinine 1.2 on January 8.  Height: 65 inches. Weight: 197 pounds January 8. Usual body weight: 215 pounds September 2017. BMI: 32.78.  Patient reports her oral intake and taste alterations are improved now that she has changed chemotherapy regimens. She has occasional constipation but takes MiraLAX twice a day and Dulcolax, when necessary. She will drink boost for breakfast and is trying to eat smaller amounts of food throughout the day. Patient has had 8% weight loss over 3 months.  Nutrition diagnosis:  Food and nutrition related knowledge deficit related to recurrent ovarian cancer and associated treatments as evidenced by no prior need for nutrition related information.  Intervention: Educated patient on the importance of smaller more frequent meals and snacks, including high-protein foods. Encouraged patient to strive for weight maintenance. Provided education on constipation. Provided fact sheets and samples of boost along with coupons. Questions were answered.  Teach back method was used.  Contact information was given.  Monitoring, evaluation, goals: Patient will tolerate adequate calories and protein for weight maintenance.  Next visit: Friday, February 23, during infusion.  **Disclaimer: This note was dictated with voice recognition software. Similar sounding words can inadvertently be transcribed and this note may contain transcription errors which may not have been corrected upon publication of note.**

## 2016-02-17 ENCOUNTER — Other Ambulatory Visit: Payer: Self-pay | Admitting: Oncology

## 2016-02-17 ENCOUNTER — Encounter (HOSPITAL_COMMUNITY): Payer: Self-pay | Admitting: Emergency Medicine

## 2016-02-17 ENCOUNTER — Inpatient Hospital Stay (HOSPITAL_COMMUNITY)
Admission: EM | Admit: 2016-02-17 | Discharge: 2016-02-21 | DRG: 175 | Disposition: A | Discharge status: Home / Self Care | Payer: Managed Care, Other (non HMO) | Attending: Internal Medicine | Admitting: Internal Medicine

## 2016-02-17 ENCOUNTER — Emergency Department (HOSPITAL_COMMUNITY): Payer: Managed Care, Other (non HMO)

## 2016-02-17 DIAGNOSIS — I82402 Acute embolism and thrombosis of unspecified deep veins of left lower extremity: Secondary | ICD-10-CM | POA: Diagnosis present

## 2016-02-17 DIAGNOSIS — R000 Tachycardia, unspecified: Secondary | ICD-10-CM | POA: Diagnosis present

## 2016-02-17 DIAGNOSIS — C563 Malignant neoplasm of bilateral ovaries: Secondary | ICD-10-CM | POA: Diagnosis present

## 2016-02-17 DIAGNOSIS — R55 Syncope and collapse: Secondary | ICD-10-CM | POA: Diagnosis present

## 2016-02-17 DIAGNOSIS — N183 Chronic kidney disease, stage 3 unspecified: Secondary | ICD-10-CM | POA: Diagnosis present

## 2016-02-17 DIAGNOSIS — R0602 Shortness of breath: Secondary | ICD-10-CM | POA: Diagnosis not present

## 2016-02-17 DIAGNOSIS — C7989 Secondary malignant neoplasm of other specified sites: Secondary | ICD-10-CM | POA: Diagnosis present

## 2016-02-17 DIAGNOSIS — I10 Essential (primary) hypertension: Secondary | ICD-10-CM | POA: Diagnosis present

## 2016-02-17 DIAGNOSIS — R531 Weakness: Secondary | ICD-10-CM | POA: Diagnosis not present

## 2016-02-17 DIAGNOSIS — K219 Gastro-esophageal reflux disease without esophagitis: Secondary | ICD-10-CM | POA: Diagnosis present

## 2016-02-17 DIAGNOSIS — R05 Cough: Secondary | ICD-10-CM | POA: Diagnosis present

## 2016-02-17 DIAGNOSIS — Z79899 Other long term (current) drug therapy: Secondary | ICD-10-CM

## 2016-02-17 DIAGNOSIS — C562 Malignant neoplasm of left ovary: Secondary | ICD-10-CM | POA: Diagnosis present

## 2016-02-17 DIAGNOSIS — C569 Malignant neoplasm of unspecified ovary: Secondary | ICD-10-CM | POA: Diagnosis not present

## 2016-02-17 DIAGNOSIS — I129 Hypertensive chronic kidney disease with stage 1 through stage 4 chronic kidney disease, or unspecified chronic kidney disease: Secondary | ICD-10-CM | POA: Diagnosis present

## 2016-02-17 DIAGNOSIS — N179 Acute kidney failure, unspecified: Secondary | ICD-10-CM | POA: Diagnosis not present

## 2016-02-17 DIAGNOSIS — D6481 Anemia due to antineoplastic chemotherapy: Secondary | ICD-10-CM | POA: Diagnosis present

## 2016-02-17 DIAGNOSIS — C561 Malignant neoplasm of right ovary: Secondary | ICD-10-CM | POA: Diagnosis present

## 2016-02-17 DIAGNOSIS — J9601 Acute respiratory failure with hypoxia: Secondary | ICD-10-CM | POA: Diagnosis present

## 2016-02-17 DIAGNOSIS — I2602 Saddle embolus of pulmonary artery with acute cor pulmonale: Principal | ICD-10-CM

## 2016-02-17 DIAGNOSIS — C785 Secondary malignant neoplasm of large intestine and rectum: Secondary | ICD-10-CM | POA: Diagnosis present

## 2016-02-17 DIAGNOSIS — J329 Chronic sinusitis, unspecified: Secondary | ICD-10-CM | POA: Diagnosis present

## 2016-02-17 DIAGNOSIS — Z9109 Other allergy status, other than to drugs and biological substances: Secondary | ICD-10-CM | POA: Diagnosis not present

## 2016-02-17 DIAGNOSIS — T451X5A Adverse effect of antineoplastic and immunosuppressive drugs, initial encounter: Secondary | ICD-10-CM | POA: Diagnosis present

## 2016-02-17 DIAGNOSIS — Z86711 Personal history of pulmonary embolism: Secondary | ICD-10-CM | POA: Diagnosis not present

## 2016-02-17 DIAGNOSIS — C787 Secondary malignant neoplasm of liver and intrahepatic bile duct: Secondary | ICD-10-CM | POA: Diagnosis present

## 2016-02-17 DIAGNOSIS — I2699 Other pulmonary embolism without acute cor pulmonale: Secondary | ICD-10-CM | POA: Diagnosis not present

## 2016-02-17 HISTORY — DX: Saddle embolus of pulmonary artery with acute cor pulmonale: I26.02

## 2016-02-17 HISTORY — DX: Adverse effect of antineoplastic and immunosuppressive drugs, initial encounter: D64.81

## 2016-02-17 HISTORY — DX: Adverse effect of antineoplastic and immunosuppressive drugs, initial encounter: T45.1X5A

## 2016-02-17 LAB — BASIC METABOLIC PANEL
Anion gap: 9 (ref 5–15)
BUN: 15 mg/dL (ref 6–20)
CO2: 23 mmol/L (ref 22–32)
CREATININE: 1.39 mg/dL — AB (ref 0.44–1.00)
Calcium: 9.6 mg/dL (ref 8.9–10.3)
Chloride: 105 mmol/L (ref 101–111)
GFR calc Af Amer: 48 mL/min — ABNORMAL LOW (ref >60)
GFR calc non Af Amer: 41 mL/min — ABNORMAL LOW (ref >60)
GLUCOSE: 127 mg/dL — AB (ref 65–99)
POTASSIUM: 4.2 mmol/L (ref 3.5–5.1)
Sodium: 137 mmol/L (ref 135–145)

## 2016-02-17 LAB — CBC
HEMATOCRIT: 29.4 % — AB (ref 36.0–46.0)
Hemoglobin: 10.1 g/dL — ABNORMAL LOW (ref 12.0–15.0)
MCH: 30.1 pg (ref 26.0–34.0)
MCHC: 34.4 g/dL (ref 30.0–36.0)
MCV: 87.5 fL (ref 78.0–100.0)
PLATELETS: 204 10*3/uL (ref 150–400)
RBC: 3.36 MIL/uL — ABNORMAL LOW (ref 3.87–5.11)
RDW: 20.2 % — AB (ref 11.5–15.5)
WBC: 5.1 10*3/uL (ref 4.0–10.5)

## 2016-02-17 LAB — I-STAT VENOUS BLOOD GAS, ED
Acid-base deficit: 2 mmol/L (ref 0.0–2.0)
BICARBONATE: 20.5 mmol/L (ref 20.0–28.0)
O2 SAT: 88 %
PCO2 VEN: 26.7 mmHg — AB (ref 44.0–60.0)
PH VEN: 7.494 — AB (ref 7.250–7.430)
TCO2: 21 mmol/L (ref 0–100)
pO2, Ven: 48 mmHg — ABNORMAL HIGH (ref 32.0–45.0)

## 2016-02-17 LAB — I-STAT TROPONIN, ED
Troponin i, poc: 0 ng/mL (ref 0.00–0.08)
Troponin i, poc: 0 ng/mL (ref 0.00–0.08)

## 2016-02-17 LAB — BRAIN NATRIURETIC PEPTIDE: B Natriuretic Peptide: 168.3 pg/mL — ABNORMAL HIGH (ref 0.0–100.0)

## 2016-02-17 MED ORDER — IOPAMIDOL (ISOVUE-370) INJECTION 76%
INTRAVENOUS | Status: AC
  Administered 2016-02-17: 100 mL
  Filled 2016-02-17: qty 100

## 2016-02-17 MED ORDER — SODIUM CHLORIDE 0.9 % IV BOLUS (SEPSIS)
1000.0000 mL | Freq: Once | INTRAVENOUS | Status: AC
  Administered 2016-02-17: 1000 mL via INTRAVENOUS

## 2016-02-17 MED ORDER — HEPARIN (PORCINE) IN NACL 100-0.45 UNIT/ML-% IJ SOLN
1250.0000 [IU]/h | INTRAMUSCULAR | Status: DC
  Administered 2016-02-17: 1100 [IU]/h via INTRAVENOUS
  Administered 2016-02-18: 1250 [IU]/h via INTRAVENOUS
  Filled 2016-02-17 (×2): qty 250

## 2016-02-17 MED ORDER — SODIUM CHLORIDE 0.9 % IV BOLUS (SEPSIS)
1000.0000 mL | Freq: Once | INTRAVENOUS | Status: AC
  Administered 2016-02-18: 1000 mL via INTRAVENOUS

## 2016-02-17 MED ORDER — HEPARIN BOLUS VIA INFUSION
5000.0000 [IU] | Freq: Once | INTRAVENOUS | Status: AC
  Administered 2016-02-17: 5000 [IU] via INTRAVENOUS
  Filled 2016-02-17: qty 5000

## 2016-02-17 NOTE — ED Triage Notes (Signed)
Pt. Stated, I've had SOB for about a week, and a cough for about 2 months.  I've also had some weakness.

## 2016-02-17 NOTE — ED Provider Notes (Addendum)
56 yo F with ovarian CA, currently on chemo (followed by [facility]) here with acute onset SOB, cough, also transient episodes of likely orthostatic syncope. Pt mildly hypoxic, tachycardic but with stable BP. EKG with sinus tach but no RBBB, no anterior TW changes. CTAngio shows saddle PE, with RV:LV ratio 1.5.   Exam, history c/w submassive PE, likely 2/2 underlying ovarian CA. Pt also has large L-sided pleural effusion c/f malignant effusion. No hypotension. D/w ICU - pt not lytic candidate 2/2 CA history. Heparin started.  **Of note, pt did have brief episode when transitioning to sitting to standing in which she briefly lost consciousness, regained upon lying flat. Likely orthostatic syncope 2/2 dropping preload in setting of new RV strain. No seizure activity. CT head neg.   Duffy Bruce, MD 02/17/16 [id number]    Duffy Bruce, MD 02/17/16 [id number]

## 2016-02-17 NOTE — ED Notes (Signed)
Wheeled pt back to room from waiting room, while talking to pt telling her I need to get her in the bed and undressed pt stated she needed a pt and didn't feel right. While in the wheelchair pt started to jerk a little, head dropped, eyes were open and I sternum rubbed pt with no response. Nurse and MD notified.

## 2016-02-17 NOTE — Consult Note (Signed)
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 It is worse with standing and exertion. Improves with lying down and resting. She has been coughing for the past 2 months. No hemoptysis. She has chest tightness and abdominal pain with coughing. Denies fever. Denies UE or LE swelling or pain. She also feels like her heart races. She last had chemo on the 9th. Her next treatment is this Friday - will be Avastin and Doxil. No recent travel, long car rides. Not currently on anticoagulants.  Independent of ADLs. 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 She has a daughter who is in her fourth year of medical school at [employer].  She has done Lovenox injections on herself before after hysterectomy and oophorectomy in 2016.   In the ED she had a witnessed episode of syncope when she was transferring from wheelchair to bed. Episode lasted a few seconds. She reports dyspnea and heart racing prior to the episode. She has been afebrile. HR 111-115. BP 110/84-151/100. O2 sat 92-98% on 2L [place]. She was started on a heparin gtt in the ED  PAST MEDICAL HISTORY :  She  has a past medical history of Acid reflux; Hypertension; and Ovarian ca ([place]).  PAST SURGICAL HISTORY: She  has a past surgical history that includes Abdominal hysterectomy (03/31/14); Cholecystectomy (2000); Cesarean section (1990); ir generic historical (12/04/2015); and ir generic historical (12/04/2015).  Allergies  Allergen Reactions  . Sulfa Antibiotics Hives     Current Facility-Administered Medications on File Prior to Encounter  Medication  . ferumoxytol (FERAHEME) 510 mg in sodium chloride 0.9 % 100 mL IVPB   Current Outpatient Prescriptions on File Prior to Encounter  Medication Sig  . albuterol (PROVENTIL HFA;VENTOLIN HFA) 108 (90 Base) MCG/ACT inhaler Inhale 2 puffs into the lungs 2 (two) times daily as needed for wheezing or shortness of breath.  . benzonatate (TESSALON) 100 MG capsule Take 1 capsule (100 mg total) by mouth 3 (three) times daily as needed for cough.  . cetirizine (ZYRTEC) 5 MG tablet Take 5 mg by mouth daily.  . Ferrous Fumarate (HEMOCYTE - 106 MG FE) 324 (106 Fe) MG TABS tablet Take 1 tablet by mouth daily. On an empty stomach with OJ.  . fluticasone (FLONASE) 50 MCG/ACT nasal spray Place 1 spray into both nostrils daily as needed.  Marland Kitchen ibuprofen (ADVIL,MOTRIN) 200 MG tablet Take 200-400 mg by mouth every 8 (eight) hours as needed.  . lidocaine-prilocaine (EMLA) cream Apply 1-2 hours prior to [facility] access as directed.  Marland Kitchen LORazepam (ATIVAN) 0.5 MG tablet Place 1 tablet under the tongue or swallow every 6 hrs as needed for nausea. Will make drowsy. (Patient not taking: Reported on 01/25/2016)  . losartan-hydrochlorothiazide (HYZAAR) 100-25 MG per tablet Take 1 tablet by mouth daily. Pt takes this medication needed depending on her BP checks.  . Multiple Vitamins-Minerals (CENTRUM SILVER ULTRA WOMENS PO) Take 1 tablet by mouth daily.  Marland Kitchen omeprazole (PRILOSEC) 20 MG capsule Take 20 mg by mouth at bedtime.  Marland Kitchen  ondansetron (ZOFRAN) 8 MG tablet Take 1 tablet (8 mg total) by mouth every 8 (eight) hours as needed for nausea (Will not make drowsy). (Patient not taking: Reported on 01/25/2016)  . polyethylene glycol (MIRALAX / GLYCOLAX) packet Take 17 g by mouth 2 (two) times daily.   . predniSONE (STERAPRED UNI-PAK 21 TAB) 10 MG (21) TBPK tablet Take 2 tablets with food in AM x3 days, then take 1 tablet with food in AM x 4 days     Family History  Problem Relation Age of Onset  . Cancer Brother 32    lung smoker  . Cancer Paternal Aunt 82    breast  . Cancer Maternal Grandmother 25    leukemia  . Cancer Paternal Grandmother     colorectal at unknown age   No family history of bleeding or blood clots.  SOCIAL HISTORY: She  reports that she has never smoked. She has never used smokeless tobacco. She reports that she does not drink alcohol or use drugs.  REVIEW OF SYSTEMS:   +chronic toe paresthesias. 11 point ROS otherwise negative except per HPI  SUBJECTIVE:  Dyspnea improved presently. No chest pain.  VITAL SIGNS: BP 151/100   Pulse 111   Temp 98.7 F (37.1 C) (Oral)   Resp 24   Ht 5\' 5"  (1.651 m)   Wt 190 lb (86.2 kg)   SpO2 98%   BMI 31.62 kg/m   PHYSICAL EXAMINATION: General:  NAD Neuro:  Awake, alert, oriented x 3, CNII-XII normal, no focal deficits HEENT:  [place]/AT, PERRL bilaterally Cardiovascular:  [place], no m/r/g Lungs:  Few crackles at bases, no wheezes Abdomen:  Soft, nontender, nondistended Musculoskeletal:  No LE edema, no calf tenderness, -Homan's sign Skin:  Warm and well perfused  LABS:  BMET  Recent Labs Lab 02/17/16 1435  NA 137  K 4.2  CL 105  CO2 23  BUN 15  CREATININE 1.39*  GLUCOSE 127*    Electrolytes  Recent Labs Lab 02/17/16 1435  CALCIUM 9.6    CBC  Recent Labs Lab 02/17/16 1435  WBC 5.1  HGB 10.1*  HCT 29.4*  PLT 204    Coag's No results for input(s): APTT, INR in the last 168 hours.  Cardiac Enzymes No results for input(s): TROPONINI, PROBNP in the last 168 hours.  Glucose No results for input(s): GLUCAP in the last 168 hours.  Imaging Dg Chest 2 View  Result Date: 02/17/2016 CLINICAL DATA:  Insert Insert EXAM: CHEST  2 VIEW COMPARISON:  None. FINDINGS: LEFT-sided port. Normal cardiac silhouette. No pulmonary edema. Lateral projection there LEFT lower lobe density. IMPRESSION: LEFT lower lobe atelectasis, effusion, or  infiltrate. Electronically Signed   By: Suzy Bouchard M.D.   On: 02/17/2016 16:05   Ct Head Wo Contrast  Result Date: 02/17/2016 CLINICAL DATA:  Dizziness x 2 days. Coughing for about couple months. Sob x 2 days. Pt denies chest pain. Pt denies headache. EXAM: CT HEAD WITHOUT CONTRAST TECHNIQUE: Contiguous axial images were obtained from the base of the skull through the vertex without intravenous contrast. COMPARISON:  07/02/2014 FINDINGS: Brain: No acute intracranial hemorrhage. No focal mass lesion. No CT evidence of acute infarction. No midline shift or mass effect. No hydrocephalus. Basilar cisterns are patent. Vascular: No hyperdense vessel or unexpected calcification. Skull: Normal. Negative for fracture or focal lesion. Sinuses/Orbits: Paranasal sinuses and mastoid air cells are clear. Orbits are clear. Other: None. IMPRESSION: No acute intracranial findings. Electronically Signed   By: Suzy Bouchard  M.D.   On: 02/17/2016 22:35   Ct Angio Chest Pe W And/or Wo Contrast  Result Date: 02/17/2016 CLINICAL DATA:  Short of breath.  Tachycardia. EXAM: CT ANGIOGRAPHY CHEST WITH CONTRAST TECHNIQUE: Multidetector CT imaging of the chest was performed using the standard protocol during bolus administration of intravenous contrast. Multiplanar CT image reconstructions and MIPs were obtained to evaluate the vascular anatomy. CONTRAST:  100 mL Isovue COMPARISON:  None. FINDINGS: Cardiovascular: Tubular filling defects span the LEFT RIGHT main pulmonary artery (so-called saddle pulmonary emboli. Large destructive pulmonary emboli extend into the proximal LEFT and RIGHT lower lobe pulmonary arteries. Emboli extend into the LEFT and RIGHT upper lobe pulmonary arteries. Evidence of RIGHT ventricular strain with the RIGHT ventricular to LEFT ventricular diameter ratio equal 1.46. Mediastinum/Nodes: No axillary or supraclavicular adenopathy. No mediastinal hilar adenopathy. Lungs/Pleura: There is a large layering  LEFT pleural effusion. Mild LEFT basilar atelectasis. Upper Abdomen: New mass in the gastrohepatic ligament measuring 6.2 x 6.3 cm (image 120, series 4 subcapsular lesion adjacent RIGHT hepatic lobe on image 125, series 4 similar to comparison CT Musculoskeletal: No aggressive osseous lesion. Review of the MIP images confirms the above findings. IMPRESSION: 1. Massive bilateral occlusive pulmonary emboli. CT evidence of right heart strain (RV/LV Ratio = 1.46) consistent with at least submassive (intermediate risk) PE. The presence of right heart strain has been associated with an increased risk of morbidity and mortality. Please activate Code PE by paging [phone]. 2. Large layering RIGHT pleural effusion. Concern for malignant effusion. 3. New large mass in the gastrohepatic ligament (6 cm) concerning for progression of ovarian carcinoma. Critical Value/emergent results were called by telephone at the time of interpretation on 02/17/2016 at 10:45 pm to Dr. Avie Echevaria , who verbally acknowledged these results. Electronically Signed   By: Suzy Bouchard M.D.   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  ASSESSMENT / PLAN:  Acute Intermediate Risk  Pulmonary Embolism: Provoked in the setting of active ovarian CA. Presently hemodynamically stable RV/LV ratio 1.46, troponin neg -Cardiac monitoring. If becomes hemodynamically unstable, will need to consider thrombolytic therapy -Continue heparin gtt -Would transition to low molecular weight heparin just prior to discharge. Anticoagulant therapy will need to be continued for at least three to six months.   Other problems: Mild AKI on CKD2 (admission cr 1.4, baseline around 1.2): On NS@100  Chronic anemia (admission hgb 10.1, baseline hgb 9): Follow CBC Recurrent Ovarian CA  Jacques Earthly, MD  Internal Medicine PGY-3 02/18/2016, 12:01 AM

## 2016-02-17 NOTE — Progress Notes (Signed)
ANTICOAGULATION CONSULT NOTE - Initial Consult  Pharmacy Consult for Heparin  Indication: pulmonary embolus  Allergies  Allergen Reactions  . Sulfa Antibiotics Hives   Patient Measurements: Height: 5\' 5"  (165.1 cm) Weight: 190 lb (86.2 kg) IBW/kg (Calculated) : 57  Vital Signs: Temp: 98.7 F (37.1 C) (01/21 1415) Temp Source: Oral (01/21 1415) BP: 110/84 (01/21 2130) Pulse Rate: 112 (01/21 2130)  Labs:  Recent Labs  02/17/16 1435  HGB 10.1*  HCT 29.4*  PLT 204  CREATININE 1.39*   Estimated Creatinine Clearance: 49 mL/min (by C-G formula based on SCr of 1.39 mg/dL (H)).  Medical History: Past Medical History:  Diagnosis Date  . Acid reflux   . Hypertension   . Ovarian ca [facility])    Assessment: 56 y/o F with recurrent ovarian CA on active chemo, shortness of breath x 1 week, cough x 2 months, starting heparin drip for new onset PE, Hgb 10.1, mild bump in Scr, PTA meds reviewed.   Goal of Therapy:  Heparin level 0.3-0.7 units/ml Monitor platelets by anticoagulation protocol: Yes   Plan:  Heparin 5000 units BOLUS Start heparin drip at 1100 units/hr 0700 HL Daily CBC/HL Monitor for bleeding   Narda Bonds 02/17/2016,10:25 PM

## 2016-02-17 NOTE — ED Notes (Signed)
Pharmacy to send heparin

## 2016-02-17 NOTE — ED Notes (Signed)
Port being accessed by [employer]

## 2016-02-17 NOTE — ED Provider Notes (Signed)
[place] DEPT Provider Note   CSN: [id number] Arrival date & time: 02/17/16  1338     History   Chief Complaint Chief Complaint  Patient presents with  . Shortness of Breath  . Cough  . Weakness    HPI Audrey Porter is a 56 y.o. female with a history of ovarian cancer who was in remission for a year until recently was restarted on chemotherapy. Asked chemotherapy treatment was January 19. She has been experiencing an isolated cough for the past 2 months. She is now short of breath for the past week. She denies leg swelling, calf pain, hemoptysis, or a history of DVT/PE. She is not currently on anticoagulants.  HPI  Past Medical History:  Diagnosis Date  . Acid reflux   . Hypertension   . Ovarian ca [facility])     Patient Active Problem List   Diagnosis Date Noted  . Iron deficiency anemia due to chronic blood loss 12/22/2015  . Port catheter in place 12/13/2015  . 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 CKD (chronic kidney disease) stage 3, GFR 30-59 ml/min 12/02/2015  . Monoallelic mutation of NVB16O gene 10/18/2014  . PICC (peripherally inserted central catheter) flush 08/25/2014  . Fibrocystic breast determined by biopsy 08/05/2014  . Anemia due to antineoplastic chemotherapy 08/03/2014  . Poor venous access 08/03/2014  . Drug-induced hypotension 05/27/2014  . Chemotherapy induced neutropenia ([place]) 05/27/2014  . Chemotherapy-induced peripheral neuropathy ([place]) 05/27/2014  . Family history of malignant neoplasm of breast 05/12/2014  . Family history of malignant neoplasm of gastrointestinal tract 05/12/2014  . Hypersensitivity reaction 05/12/2014  . Environmental allergies 05/07/2014  . Gastroesophageal reflux disease without esophagitis 05/07/2014  . HTN (hypertension), benign 05/07/2014  . 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 Status post cholecystectomy 05/07/2014  . Ovarian cancer, bilateral ([place]) 04/26/2014    Past Surgical History:    Procedure Laterality Date  . ABDOMINAL HYSTERECTOMY  03/31/14   at [facility]  . CESAREAN SECTION  1990  . CHOLECYSTECTOMY  2000  . IR GENERIC HISTORICAL  12/04/2015   IR FLUORO GUIDE PORT INSERTION RIGHT 12/04/2015 Greggory Keen, MD [facility]-INTERV RAD  . IR GENERIC HISTORICAL  12/04/2015   IR US GUIDE VASC ACCESS RIGHT 12/04/2015 Greggory Keen, MD [facility]-INTERV RAD    OB History    No data available       Home Medications    Prior to Admission medications   Medication Sig Start Date End Date Taking? Authorizing Provider  albuterol (PROVENTIL HFA;VENTOLIN HFA) 108 (90 Base) MCG/ACT inhaler Inhale 2 puffs into the lungs 2 (two) times daily as needed for wheezing or shortness of breath. 01/25/16   Lennis Marion Downer, MD  benzonatate (TESSALON) 100 MG capsule Take 1 capsule (100 mg total) by mouth 3 (three) times daily as needed for cough. 01/25/16   Lennis Marion Downer, MD  cetirizine (ZYRTEC) 5 MG tablet Take 5 mg by mouth daily.    Historical Provider, MD  Ferrous Fumarate (HEMOCYTE - 106 MG FE) 324 (106 Fe) MG TABS tablet Take 1 tablet by mouth daily. On an empty stomach with OJ.    Historical Provider, MD  fluticasone (FLONASE) 50 MCG/ACT nasal spray Place 1 spray into both nostrils daily as needed. 12/21/15   Lennis Marion Downer, MD  ibuprofen (ADVIL,MOTRIN) 200 MG tablet Take 200-400 mg by mouth every 8 (eight) hours as needed.    Historical Provider, MD  lidocaine-prilocaine (EMLA) cream Apply 1-2 hours prior to [facility] access as directed. 12/03/15  Lennis Marion Downer, MD  LORazepam (ATIVAN) 0.5 MG tablet Place 1 tablet under the tongue or swallow every 6 hrs as needed for nausea. Will make drowsy. Patient not taking: Reported on 01/25/2016 12/03/15   Gordy Levan, MD  losartan-hydrochlorothiazide (HYZAAR) 100-25 MG per tablet Take 1 tablet by mouth daily. Pt takes this medication needed depending on her BP checks.    Historical Provider, MD  Multiple Vitamins-Minerals (CENTRUM SILVER ULTRA WOMENS PO)  Take 1 tablet by mouth daily.    Historical Provider, MD  omeprazole (PRILOSEC) 20 MG capsule Take 20 mg by mouth at bedtime.    Historical Provider, MD  ondansetron (ZOFRAN) 8 MG tablet Take 1 tablet (8 mg total) by mouth every 8 (eight) hours as needed for nausea (Will not make drowsy). Patient not taking: Reported on 01/25/2016 12/03/15   Gordy Levan, MD  polyethylene glycol (MIRALAX / GLYCOLAX) packet Take 17 g by mouth 2 (two) times daily.     Historical Provider, MD  predniSONE (STERAPRED UNI-PAK 21 TAB) 10 MG (21) TBPK tablet Take 2 tablets with food in AM x3 days, then take 1 tablet with food in AM x 4 days 01/25/16   Gordy Levan, MD    Family History Family History  Problem Relation Age of Onset  . Cancer Brother 70    lung smoker  . Cancer Paternal Aunt 13    breast  . Cancer Maternal Grandmother 25    leukemia  . Cancer Paternal Grandmother     colorectal at unknown age    Social History Social History  Substance Use Topics  . Smoking status: Never Smoker  . Smokeless tobacco: Never Used  . Alcohol use No     Allergies   Sulfa antibiotics   Review of Systems Review of Systems  Constitutional: Negative for chills and fever.  HENT: Negative for congestion, ear pain and sore throat.   Eyes: Negative for pain and visual disturbance.  Respiratory: Positive for cough and shortness of breath. Negative for choking, chest tightness, wheezing and stridor.   Cardiovascular: Negative for chest pain, palpitations and leg swelling.  Gastrointestinal: Negative for abdominal distention, abdominal pain, diarrhea, nausea and vomiting.  Genitourinary: Negative for dysuria and hematuria.  Musculoskeletal: Negative for arthralgias, back pain, myalgias, neck pain and neck stiffness.  Skin: Positive for pallor. Negative for color change and rash.  Neurological: Positive for dizziness, syncope and light-headedness. Negative for seizures and numbness.  All other systems  reviewed and are negative.    Physical Exam Updated Vital Signs BP 151/100   Pulse 111   Temp 98.7 F (37.1 C) (Oral)   Resp 24   Ht '5\' 5"'  (1.651 m)   Wt 86.2 kg   SpO2 98%   BMI 31.62 kg/m   Physical Exam  Constitutional: She appears well-developed and well-nourished. No distress.  Afebrile, nontoxic appearing, slightly pale sitting comfortably in bed in no acute distress.  HENT:  Head: Normocephalic and atraumatic.  Eyes: Conjunctivae and EOM are normal.  Neck: Normal range of motion. Neck supple.  Cardiovascular: Normal rate and regular rhythm.   No murmur heard. Pulmonary/Chest: Effort normal. No respiratory distress. She has no wheezes. She has no rales. She exhibits no tenderness.  Patient had reduced lung sounds in the left lower lobe  Abdominal: Soft. She exhibits no distension. There is no tenderness.  Musculoskeletal: She exhibits no edema or tenderness.  No tenderness to palpation of lower extremities or swelling.  Neurological: She is  alert.  Skin: Skin is warm and dry. She is not diaphoretic.  Psychiatric: She has a normal mood and affect. Her behavior is normal.  Nursing note and vitals reviewed.    ED Treatments / Results  Labs (all labs ordered are listed, but only abnormal results are displayed) Labs Reviewed  BASIC METABOLIC PANEL - Abnormal; Notable for the following:       Result Value   Glucose, Bld 127 (*)    Creatinine, Ser 1.39 (*)    GFR calc non Af Amer 41 (*)    GFR calc Af Amer 48 (*)    All other components within normal limits  CBC - Abnormal; Notable for the following:    RBC 3.36 (*)    Hemoglobin 10.1 (*)    HCT 29.4 (*)    RDW 20.2 (*)    All other components within normal limits  BRAIN NATRIURETIC PEPTIDE - Abnormal; Notable for the following:    B Natriuretic Peptide 168.3 (*)    All other components within normal limits  I-STAT VENOUS BLOOD GAS, ED - Abnormal; Notable for the following:    pH, Ven 7.494 (*)    pCO2, Ven  26.7 (*)    pO2, Ven 48.0 (*)    All other components within normal limits  HEPARIN LEVEL (UNFRACTIONATED)  CBC  I-STAT TROPOININ, ED  I-STAT TROPOININ, ED    EKG  EKG Interpretation None       Radiology Dg Chest 2 View  Result Date: 02/17/2016 CLINICAL DATA:  Insert Insert EXAM: CHEST  2 VIEW COMPARISON:  None. FINDINGS: LEFT-sided port. Normal cardiac silhouette. No pulmonary edema. Lateral projection there LEFT lower lobe density. IMPRESSION: LEFT lower lobe atelectasis, effusion, or infiltrate. Electronically Signed   By: Suzy Bouchard M.D.   On: 02/17/2016 16:05   Ct Head Wo Contrast  Result Date: 02/17/2016 CLINICAL DATA:  Dizziness x 2 days. Coughing for about couple months. Sob x 2 days. Pt denies chest pain. Pt denies headache. EXAM: CT HEAD WITHOUT CONTRAST TECHNIQUE: Contiguous axial images were obtained from the base of the skull through the vertex without intravenous contrast. COMPARISON:  07/02/2014 FINDINGS: Brain: No acute intracranial hemorrhage. No focal mass lesion. No CT evidence of acute infarction. No midline shift or mass effect. No hydrocephalus. Basilar cisterns are patent. Vascular: No hyperdense vessel or unexpected calcification. Skull: Normal. Negative for fracture or focal lesion. Sinuses/Orbits: Paranasal sinuses and mastoid air cells are clear. Orbits are clear. Other: None. IMPRESSION: No acute intracranial findings. Electronically Signed   By: Suzy Bouchard M.D.   On: 02/17/2016 22:35   Ct Angio Chest Pe W And/or Wo Contrast  Result Date: 02/17/2016 CLINICAL DATA:  Short of breath.  Tachycardia. EXAM: CT ANGIOGRAPHY CHEST WITH CONTRAST TECHNIQUE: Multidetector CT imaging of the chest was performed using the standard protocol during bolus administration of intravenous contrast. Multiplanar CT image reconstructions and MIPs were obtained to evaluate the vascular anatomy. CONTRAST:  100 mL Isovue COMPARISON:  None. FINDINGS: Cardiovascular: Tubular  filling defects span the LEFT RIGHT main pulmonary artery (so-called saddle pulmonary emboli. Large destructive pulmonary emboli extend into the proximal LEFT and RIGHT lower lobe pulmonary arteries. Emboli extend into the LEFT and RIGHT upper lobe pulmonary arteries. Evidence of RIGHT ventricular strain with the RIGHT ventricular to LEFT ventricular diameter ratio equal 1.46. Mediastinum/Nodes: No axillary or supraclavicular adenopathy. No mediastinal hilar adenopathy. Lungs/Pleura: There is a large layering LEFT pleural effusion. Mild LEFT basilar atelectasis. Upper Abdomen: New mass  in the gastrohepatic ligament measuring 6.2 x 6.3 cm (image 120, series 4 subcapsular lesion adjacent RIGHT hepatic lobe on image 125, series 4 similar to comparison CT Musculoskeletal: No aggressive osseous lesion. Review of the MIP images confirms the above findings. IMPRESSION: 1. Massive bilateral occlusive pulmonary emboli. CT evidence of right heart strain (RV/LV Ratio = 1.46) consistent with at least submassive (intermediate risk) PE. The presence of right heart strain has been associated with an increased risk of morbidity and mortality. Please activate Code PE by paging [phone]. 2. Large layering RIGHT pleural effusion. Concern for malignant effusion. 3. New large mass in the gastrohepatic ligament (6 cm) concerning for progression of ovarian carcinoma. Critical Value/emergent results were called by telephone at the time of interpretation on 02/17/2016 at 10:45 pm to Dr. Avie Echevaria , who verbally acknowledged these results. Electronically Signed   By: Suzy Bouchard M.D.   On: 02/17/2016 22:47    Procedures Procedures (including critical care time)  Medications Ordered in ED Medications  heparin ADULT infusion 100 units/mL (25000 units/233m sodium chloride 0.45%) (1,100 Units/hr Intravenous New Bag/Given 02/17/16 2242)  sodium chloride 0.9 % bolus 1,000 mL (not administered)  sodium chloride 0.9 % bolus  1,000 mL (1,000 mLs Intravenous New Bag/Given 02/17/16 2212)  iopamidol (ISOVUE-370) 76 % injection (100 mLs  Contrast Given 02/17/16 2140)  heparin bolus via infusion 5,000 Units (5,000 Units Intravenous Bolus from Bag 02/17/16 2242)     Initial Impression / Assessment and Plan / ED Course  I have reviewed the triage vital signs and the nursing notes.  Pertinent labs & imaging results that were available during my care of the patient were reviewed by me and considered in my medical decision making (see chart for details).      56year old female with history of ovarian Cancer with recent recurrence. Last chemotherapy treatment on January 9. She is presenting with shortness of breath, tachycardic, hypoxic. Chest x-ray with left lower lobe opacity. Reduced lung sounds in left lower lobe on exam.  Nurse reported an episode of near syncope while attempting to move patient to ED bed.  High suspicion for PE Ordered Cta  Started O2  via [place] at 2l/min  Patient started on IV fluids 10:17 - CT with PE Code PE was activated Ordered heparin per pharmacy consult. Patient was started on heparin drip  Patient was observed and remained stable and comfortable while in the Ed, no acute distress.  Patient will be admitted and Dr. IEllender Hosespoke with admitting physician regarding this patient.  Final Clinical Impressions(s) / ED Diagnoses   Final diagnoses:  Acute saddle pulmonary embolism with acute cor pulmonale ([facility]    New Prescriptions New Prescriptions   No medications on file     JEmeline General PHershal Coria01/22/18 0030    CDuffy Bruce MD 02/18/16 [phone]

## 2016-02-18 ENCOUNTER — Inpatient Hospital Stay (HOSPITAL_COMMUNITY): Payer: Managed Care, Other (non HMO)

## 2016-02-18 ENCOUNTER — Encounter (HOSPITAL_COMMUNITY): Payer: Self-pay | Admitting: Family Medicine

## 2016-02-18 DIAGNOSIS — Z86711 Personal history of pulmonary embolism: Secondary | ICD-10-CM

## 2016-02-18 DIAGNOSIS — N183 Chronic kidney disease, stage 3 (moderate): Secondary | ICD-10-CM

## 2016-02-18 DIAGNOSIS — R05 Cough: Secondary | ICD-10-CM

## 2016-02-18 DIAGNOSIS — C561 Malignant neoplasm of right ovary: Secondary | ICD-10-CM

## 2016-02-18 DIAGNOSIS — I2602 Saddle embolus of pulmonary artery with acute cor pulmonale: Principal | ICD-10-CM

## 2016-02-18 DIAGNOSIS — Z9109 Other allergy status, other than to drugs and biological substances: Secondary | ICD-10-CM

## 2016-02-18 DIAGNOSIS — R531 Weakness: Secondary | ICD-10-CM

## 2016-02-18 DIAGNOSIS — C562 Malignant neoplasm of left ovary: Secondary | ICD-10-CM

## 2016-02-18 DIAGNOSIS — C569 Malignant neoplasm of unspecified ovary: Secondary | ICD-10-CM

## 2016-02-18 DIAGNOSIS — I2699 Other pulmonary embolism without acute cor pulmonale: Secondary | ICD-10-CM

## 2016-02-18 DIAGNOSIS — R0602 Shortness of breath: Secondary | ICD-10-CM

## 2016-02-18 DIAGNOSIS — T451X5A Adverse effect of antineoplastic and immunosuppressive drugs, initial encounter: Secondary | ICD-10-CM

## 2016-02-18 DIAGNOSIS — D6481 Anemia due to antineoplastic chemotherapy: Secondary | ICD-10-CM

## 2016-02-18 DIAGNOSIS — I10 Essential (primary) hypertension: Secondary | ICD-10-CM

## 2016-02-18 LAB — BASIC METABOLIC PANEL
Anion gap: 11 (ref 5–15)
BUN: 15 mg/dL (ref 6–20)
CHLORIDE: 109 mmol/L (ref 101–111)
CO2: 21 mmol/L — ABNORMAL LOW (ref 22–32)
CREATININE: 1.31 mg/dL — AB (ref 0.44–1.00)
Calcium: 8.7 mg/dL — ABNORMAL LOW (ref 8.9–10.3)
GFR, EST AFRICAN AMERICAN: 52 mL/min — AB (ref >60)
GFR, EST NON AFRICAN AMERICAN: 45 mL/min — AB (ref >60)
Glucose, Bld: 116 mg/dL — ABNORMAL HIGH (ref 65–99)
Potassium: 4.1 mmol/L (ref 3.5–5.1)
SODIUM: 141 mmol/L (ref 135–145)

## 2016-02-18 LAB — CBC
HEMATOCRIT: 27.7 % — AB (ref 36.0–46.0)
HEMOGLOBIN: 9.3 g/dL — AB (ref 12.0–15.0)
MCH: 29.5 pg (ref 26.0–34.0)
MCHC: 33.6 g/dL (ref 30.0–36.0)
MCV: 87.9 fL (ref 78.0–100.0)
Platelets: 159 10*3/uL (ref 150–400)
RBC: 3.15 MIL/uL — ABNORMAL LOW (ref 3.87–5.11)
RDW: 20.4 % — ABNORMAL HIGH (ref 11.5–15.5)
WBC: 4.1 10*3/uL (ref 4.0–10.5)

## 2016-02-18 LAB — ECHOCARDIOGRAM LIMITED
Height: 65 in
WEIGHTICAEL: 3078.4 [oz_av]

## 2016-02-18 LAB — TROPONIN I: TROPONIN I: 0.03 ng/mL — AB (ref <0.03)

## 2016-02-18 LAB — HEPARIN LEVEL (UNFRACTIONATED)
Heparin Unfractionated: 0.34 IU/mL (ref 0.30–0.70)
Heparin Unfractionated: 0.43 IU/mL (ref 0.30–0.70)

## 2016-02-18 LAB — MRSA PCR SCREENING: MRSA by PCR: NEGATIVE

## 2016-02-18 MED ORDER — LORATADINE 10 MG PO TABS
10.0000 mg | ORAL_TABLET | Freq: Every day | ORAL | Status: DC
  Administered 2016-02-18 – 2016-02-21 (×4): 10 mg via ORAL
  Filled 2016-02-18 (×4): qty 1

## 2016-02-18 MED ORDER — ENSURE ENLIVE PO LIQD
237.0000 mL | Freq: Two times a day (BID) | ORAL | Status: DC
  Administered 2016-02-18 – 2016-02-19 (×2): 237 mL via ORAL

## 2016-02-18 MED ORDER — PANTOPRAZOLE SODIUM 40 MG PO TBEC
40.0000 mg | DELAYED_RELEASE_TABLET | Freq: Every day | ORAL | Status: DC
  Administered 2016-02-18 – 2016-02-21 (×4): 40 mg via ORAL
  Filled 2016-02-18 (×4): qty 1

## 2016-02-18 MED ORDER — ONDANSETRON HCL 4 MG PO TABS
4.0000 mg | ORAL_TABLET | Freq: Four times a day (QID) | ORAL | Status: DC | PRN
Start: 2016-02-18 — End: 2016-02-21

## 2016-02-18 MED ORDER — SODIUM CHLORIDE 0.9% FLUSH
3.0000 mL | Freq: Two times a day (BID) | INTRAVENOUS | Status: DC
  Administered 2016-02-19 – 2016-02-20 (×3): 3 mL via INTRAVENOUS

## 2016-02-18 MED ORDER — LORAZEPAM 1 MG PO TABS: 1.0000 mg | ORAL_TABLET | Freq: Four times a day (QID) | ORAL | Status: DC | PRN

## 2016-02-18 MED ORDER — FERROUS FUMARATE 324 (106 FE) MG PO TABS
1.0000 | ORAL_TABLET | Freq: Every day | ORAL | Status: DC
  Administered 2016-02-18 – 2016-02-21 (×4): 106 mg via ORAL
  Filled 2016-02-18 (×4): qty 1

## 2016-02-18 MED ORDER — LORAZEPAM 0.5 MG PO TABS
0.5000 mg | ORAL_TABLET | Freq: Once | ORAL | Status: DC
  Filled 2016-02-18: qty 1

## 2016-02-18 MED ORDER — SODIUM CHLORIDE 0.9 % IV SOLN
INTRAVENOUS | Status: DC
  Administered 2016-02-18: 20:00:00 via INTRAVENOUS
  Administered 2016-02-18: 1000 mL via INTRAVENOUS
  Administered 2016-02-19: 05:00:00 via INTRAVENOUS

## 2016-02-18 MED ORDER — ACETAMINOPHEN 325 MG PO TABS: 650.0000 mg | ORAL_TABLET | Freq: Four times a day (QID) | ORAL | Status: DC | PRN

## 2016-02-18 MED ORDER — FLUTICASONE PROPIONATE 50 MCG/ACT NA SUSP: 1.0000 | Freq: Every day | NASAL | Status: DC | PRN

## 2016-02-18 MED ORDER — ACETAMINOPHEN 650 MG RE SUPP: 650.0000 mg | Freq: Four times a day (QID) | RECTAL | Status: DC | PRN

## 2016-02-18 MED ORDER — LORAZEPAM 0.5 MG PO TABS
0.5000 mg | ORAL_TABLET | Freq: Every evening | ORAL | Status: DC | PRN
Start: 2016-02-19 — End: 2016-02-21

## 2016-02-18 MED ORDER — BENZONATATE 100 MG PO CAPS
100.0000 mg | ORAL_CAPSULE | Freq: Three times a day (TID) | ORAL | Status: DC | PRN
  Administered 2016-02-20: 100 mg via ORAL
  Filled 2016-02-18: qty 1

## 2016-02-18 MED ORDER — ONDANSETRON HCL 4 MG/2ML IJ SOLN: 4.0000 mg | Freq: Four times a day (QID) | INTRAMUSCULAR | Status: DC | PRN

## 2016-02-18 MED ORDER — POLYETHYLENE GLYCOL 3350 17 G PO PACK: 17.0000 g | PACK | Freq: Every day | ORAL | Status: DC | PRN

## 2016-02-18 MED ORDER — OXYCODONE HCL 5 MG PO TABS: 5.0000 mg | ORAL_TABLET | ORAL | Status: DC | PRN

## 2016-02-18 NOTE — Progress Notes (Signed)
PULMONARY / CRITICAL CARE MEDICINE   Name: Audrey Porter MRN: [id number] DOB: 08-Nov-1960    ADMISSION DATE:  02/17/2016 CONSULTATION DATE:  02/18/2016  REFERRING MD:  Ellender Hose, MD (EDP)  CHIEF COMPLAINT:  Dyspnea  BRIEF SUMMARY:   56 y/o F with PMH of ovarian cancer with mets to the liver, colon & diaphragm (on Doxil / Avastin per Dr. Marko Plume) admitted with SOB and pre-syncope for 2-3 days.  She had a witnessed syncopal episode in the ER.  CTA of the chest was positive for massive bilateral occlusive pulmonary emboli with an RV/LV ratio of 1.46. Heparin gtt started in the ER.    Independent of ADLs. 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 She has a daughter who is in her fourth year of medical school at [employer].  She has done Lovenox injections on herself before after hysterectomy and oophorectomy in 2016.     SUBJECTIVE:  Pt denies SOB.  Walked to the restroom without difficulty.  ECHO / LE duplex pending.    VITAL SIGNS: BP 107/71   Pulse (!) 103   Temp 97.6 F (36.4 C) (Oral)   Resp (!) 0   Ht '5\' 5"'  (1.651 m)   Wt 192 lb 6.4 oz (87.3 kg)   SpO2 100%   BMI 32.02 kg/m   PHYSICAL EXAMINATION: General: adult female in NAD, lying in bed HEENT: MM pink/moist, no jvd Neuro: AAOx4, speech clear, MAE CV: s1s2 rrr, no m/r/g PULM: even/non-labored, lungs bilaterally clear, soft wheeze on L YO:VZCH, non-tender, bsx4 active  Extremities: warm/dry, no LE edema  Skin: no rashes or lesions   LABS:  BMET  Recent Labs Lab 02/17/16 1435 02/18/16 1041  NA 137 141  K 4.2 4.1  CL 105 109  CO2 23 21*  BUN 15 15  CREATININE 1.39* 1.31*  GLUCOSE 127* 116*    Electrolytes  Recent Labs Lab 02/17/16 1435 02/18/16 1041  CALCIUM 9.6 8.7*    CBC  Recent Labs Lab 02/17/16 1435 02/18/16 1041  WBC 5.1 4.1  HGB 10.1* 9.3*  HCT 29.4* 27.7*  PLT 204 159    Coag's No results for input(s): APTT, INR in the last 168 hours.  Cardiac Enzymes  Recent Labs Lab  02/18/16 1041  TROPONINI 0.03*  <0.03    Glucose No results for input(s): GLUCAP in the last 168 hours.  Imaging Dg Chest 2 View  Result Date: 02/17/2016 CLINICAL DATA:  Insert Insert EXAM: CHEST  2 VIEW COMPARISON:  None. FINDINGS: LEFT-sided port. Normal cardiac silhouette. No pulmonary edema. Lateral projection there LEFT lower lobe density. IMPRESSION: LEFT lower lobe atelectasis, effusion, or infiltrate. Electronically Signed   By: Suzy Bouchard M.D.   On: 02/17/2016 16:05   Ct Head Wo Contrast  Result Date: 02/17/2016 CLINICAL DATA:  Dizziness x 2 days. Coughing for about couple months. Sob x 2 days. Pt denies chest pain. Pt denies headache. EXAM: CT HEAD WITHOUT CONTRAST TECHNIQUE: Contiguous axial images were obtained from the base of the skull through the vertex without intravenous contrast. COMPARISON:  07/02/2014 FINDINGS: Brain: No acute intracranial hemorrhage. No focal mass lesion. No CT evidence of acute infarction. No midline shift or mass effect. No hydrocephalus. Basilar cisterns are patent. Vascular: No hyperdense vessel or unexpected calcification. Skull: Normal. Negative for fracture or focal lesion. Sinuses/Orbits: Paranasal sinuses and mastoid air cells are clear. Orbits are clear. Other: None. IMPRESSION: No acute intracranial findings. Electronically Signed   By: Suzy Bouchard M.D.   On:  02/17/2016 22:35   Ct Angio Chest Pe W And/or Wo Contrast  Result Date: 02/17/2016 CLINICAL DATA:  Short of breath.  Tachycardia. EXAM: CT ANGIOGRAPHY CHEST WITH CONTRAST TECHNIQUE: Multidetector CT imaging of the chest was performed using the standard protocol during bolus administration of intravenous contrast. Multiplanar CT image reconstructions and MIPs were obtained to evaluate the vascular anatomy. CONTRAST:  100 mL Isovue COMPARISON:  None. FINDINGS: Cardiovascular: Tubular filling defects span the LEFT RIGHT main pulmonary artery (so-called saddle pulmonary emboli. Large  destructive pulmonary emboli extend into the proximal LEFT and RIGHT lower lobe pulmonary arteries. Emboli extend into the LEFT and RIGHT upper lobe pulmonary arteries. Evidence of RIGHT ventricular strain with the RIGHT ventricular to LEFT ventricular diameter ratio equal 1.46. Mediastinum/Nodes: No axillary or supraclavicular adenopathy. No mediastinal hilar adenopathy. Lungs/Pleura: There is a large layering LEFT pleural effusion. Mild LEFT basilar atelectasis. Upper Abdomen: New mass in the gastrohepatic ligament measuring 6.2 x 6.3 cm (image 120, series 4 subcapsular lesion adjacent RIGHT hepatic lobe on image 125, series 4 similar to comparison CT Musculoskeletal: No aggressive osseous lesion. Review of the MIP images confirms the above findings. IMPRESSION: 1. Massive bilateral occlusive pulmonary emboli. CT evidence of right heart strain (RV/LV Ratio = 1.46) consistent with at least submassive (intermediate risk) PE. The presence of right heart strain has been associated with an increased risk of morbidity and mortality. Please activate Code PE by paging [phone]. 2. Large layering RIGHT pleural effusion. Concern for malignant effusion. 3. New large mass in the gastrohepatic ligament (6 cm) concerning for progression of ovarian carcinoma. Critical Value/emergent results were called by telephone at the time of interpretation on 02/17/2016 at 10:45 pm to Dr. Avie Echevaria , who verbally acknowledged these results. Electronically Signed   By: Suzy Bouchard M.D.   On: 02/17/2016 22:47     STUDIES:  1/21 EKG >> Sinus tachycardia, T wave inversion in III, no previous EKG for comparison 1/21 CXR >> LLL density 1/21 CT head >> No acute findings 1/21 CTA chest >> Large bilateral occlusive PE, RV/LV ratio 1.46, large R pleural effusion, new large mass in gastrohepatic ligament   SIGNIFICANT EVENTS: 1/21  Admitted to SDU with PE  LINES/TUBES: Port on R side of chest  DISCUSSION: 49F with  metastatic ovarian cancer, with recent change in chemo regimen to Avastin and Doxil, who presents after a syncopal episode with submassive PE and new oxygen requirement. Risk factors for VTE include Avastin and active malignancy. Denies hx of blood clots, miscarriage, immobility, long travel. Negative family hx.    ASSESSMENT / PLAN:  Acute Intermediate Risk Pulmonary Embolism: Provoked in the setting of active ovarian CA. Presently hemodynamically stable RV/LV ratio 1.46, troponin neg - conservative management / heparin gtt as high risk for bleeding with lytics - Heparin gtt per pharmacy  - follow up TTE, troponin and LE duplex  - if she decompensates (hemodynamic instability or cardiac arrest), she would want a trial of lytics and is aware of high bleeding risk.   - Recommend transition to lovenox therapy given history of malignancy  - Would transition to low molecular weight heparin just prior to discharge. Anticoagulant therapy will likely need to be life long given malignancy hx - assess for ambulatory O2 needs prior to discharge - O2 to support sats >92%  Other problems: Mild AKI on CKD2 (admission cr 1.4, baseline around 1.2): On NS'@100'  Chronic anemia (admission hgb 10.1, baseline hgb 9): Follow CBC Recurrent Ovarian CA - recurrence  in 10/2015  PCCM will be available PRN. Please call back if new needs arise.   Noe Gens, NP-C [facility] Pgr: [phone] or if no answer [phone] 02/18/2016, 1:20 PM  STAFF NOTE: I, Merrie Roof, MD FACP have personally reviewed patient's available data, including medical history, events of note, physical examination and test results as part of my evaluation. I have discussed with resident/NP and other care providers such as pharmacist, RN and RRT. In addition, I personally evaluated patient and elicited key findings of: awake, alert, no distress, mild tachy at 104, no rt heeve on chest, lungs reduced bases, sats wnl, BP 127  sys, CT with saddle PE, RV strain not high 1.1, echo pending, dopplers just back with DVT ( unofficial), risk benefit ratio does NOT favor lytics with bleeding risk in setting met cancer, also she had mod TR ( pa htn) on last echo 1 year ago, I think she tolerates this well as likely occurred in stages and with prior pulm HTN, would benefit lovenox with cancer in next 48 hrs, would NOT place filter at this stage with such good clinical status and tolerating anticoagulation, this appears to be life long need anticoagulation given massive clot burden and life threatening nature with no treatments cure cancer, call if needed, will sign off  Lavon Paganini. Titus Mould, MD, [place] Pgr: [place] 02/18/2016 4:10 PM

## 2016-02-18 NOTE — Progress Notes (Signed)
VASCULAR LAB PRELIMINARY  PRELIMINARY  PRELIMINARY  PRELIMINARY  Bilateral lower extremity venous duplex completed.    Preliminary report:  There is acute DVT noted in the left popliteal and distal femoral veins.  Called report to [place], RN  [facility], Charlestine Rookstool, RVT 02/18/2016, 3:01 PM

## 2016-02-18 NOTE — Progress Notes (Signed)
  Echocardiogram 2D Echocardiogram has been performed.  Jennette Dubin 02/18/2016, 2:22 PM

## 2016-02-18 NOTE — Progress Notes (Signed)
Medical Oncology February 18, 2016, 9:37 PM  Hospital day 1 Chemotherapy: cycle 1 doxil 01-25-16, with avastin 01-25-16 and 02-08-16.  Outpatient physicians: Nancy Marus, Gar Ponto (PCP), Gari Crown, L.[place] notification by Dr Hartford Poli of admission for massive PEs.  EMR reviewed, patient seen with husband here. Discussed with unit RN She is under care at [facility] for recurrent, platinum refractory ovarian cancer.    Subjective:  Patient reports increased SOB for past week, then weakness and marked SOB 02-16-16 night into AM 02-17-16. She denies chest pain, LE swelling or pain. She has had clear rhinorrhea and cough minimally productive of clear sputum intermittently since late Nov, with multiple interventions tried, most response to prednisone x 1 week + inhaler; she was not SOB with those symptoms.  She was brought to ED by husband, with CTA chest 02-17-16 with massive bilateral PEs with evidence of right heart strain. LE venous dopplers today found acute DVT popliteal and distal femoral veins LLE. Echocardiogram today shows normal LV size and systolic function with severely dilated RV with moderately decreased systolic function and severe pulmonary HTN, these findings new from echocardiogram done 01-25-16 prior to cycle 1 doxil.  She is on heparin infusion.  Patient reports breathing better still in bed with [place] O2, which needed increase to 4 liters this afternoon as she was talking with visitors. She has walked to [place] (without O2 as tubing does not reach), extremely SOB with this exertion per patient and husband; she agrees now to use BSC or bedpan tonight, RN aware. She denies chest pain or pain LLE. She has some discomfort upper abdomen to right of midline with much coughing, as previously. She denies any bleeding or nausea. She has had Boost and a little fruit, no nausea but appetite poor. Bowels ok. No fever, no problems with PAC.     ONCOLOGIC HISTORY Patient developed  bladder symptoms in early 2016. Urine culture showed no infection, then CT AP at [facility] 03-16-14 found perihepatic ascites and pelvic mass 9.9 x 20.1 x 17.8 cm which was cystic and solid, kidneys and ureters unremarkable, no pelvic fluid, rectosigmoid partially compressed, other organs not remarkable, no mention of adenopathy. She was seen by Dr Evie Lacks of gyn,  CT findings confirmed by transvaginal US. CA 125 was 56 pre op. She saw Dr Alycia Rossetti in consultation 03-23-14, then surgery at [facility]  03-31-14 TAH/BSO, omentectomy, pelvic and paraaortic node evaluation. Surgical findings were of bilateral complex ovarian masses and dense adhesions, with intra-operative spill of right ovarian mass which was densely adherent to sigmoid serosa; posterior uterine segment also adherent to rectosigmoid, and retroperitoneal fibrosis bilaterally. 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 Cytology from ascites [facility] [phone])  positive for adenocarcinoma of gyn primary. [facility] multidisciplinary conference 04-19-14 recommended carboplatin and taxane chemotherapy. Cycle 1 carbo taxol was given on 05-12-14, with neutropenia by day 11 (ANC 0.8), neulasta as on pro more helpful than granix.She was hospitalized at [facility] after cycle 3 with syncope related to dehydration. She completed cycle 6 on 09-01-14. PET 08-2014 no clear residual or progressive disease; colonoscopy was unremarkable. She had peripheral neuropathy with taxol. CA 125 was 7.5 in 06-2015, then up to 18.4 by 10-2015. CT CAP 11-19-15 showed new progression subcapsular areas of liver, at proximal sigmoid colon and in mesentery, with  small ascites. She began chemotherapy for recurrence with carbo taxol on 12-06-15, with on pro neulasta. She had 2 units PRBCs for hemoglobin down to 7.5  o11-24-17. She had  increasing abdominal discomfort and increase in CA 125 marker from 32 as baseline for retreatment carbo taxol up to 64 in late Nov and 74 after cycle 2 chemo, which was given 12-28-15. CT AP 01-16-16 showed progression around liver, with lesser increase in region of sigmoid colon (platinum refractory disease). Treatment changed to doxil with avastin 01-25-16, with avastin also on 02-08-16.  She was admitted with massive PEs found 02-17-16.   Objective: Vital signs in last 24 hours: Blood pressure (!) 150/86, pulse (!) 109, temperature 98.2 F (36.8 C), temperature source Oral, resp. rate 20, height _0  (1.651 m), weight 192 lb 6.4 oz (87.3 kg), SpO2 100 %. Awake, alert, lying still supine at 30 degrees with [place] O2. Respirations a little shallow, not dyspneic talking. Somewhat pale, not icteric. Oral mucosa moist. PAC infusing without difficulty. No JVD supine. Lungs very diminished BS right anterior chest >left, no wheezes or rubs anteriorly. No cough during viist. Abdomen soft, a few normal bowel sounds. LE no pitting edema, cords, tenderness. Feet warm. Moves all extremities in bed.   Intake/Output from previous day: 01/21 0701 - 01/22 0700 In: 2000 [IV Piggyback:2000] Out: -  Intake/Output this shift: Total I/O In: 240 [P.O.:240] Out: -     Lab Results:  Recent Labs  02/17/16 1435 02/18/16 1041  WBC 5.1 4.1  HGB 10.1* 9.3*  HCT 29.4* 27.7*  PLT 204 159   BMET  Recent Labs  02/17/16 1435 02/18/16 1041  NA 137 141  K 4.2 4.1  CL 105 109  CO2 23 21*  GLUCOSE 127* 116*  BUN 15 15  CREATININE 1.39* 1.31*  CALCIUM 9.6 8.7*    Studies/Results: Dg Chest 2 View  Result Date: 02/17/2016 CLINICAL DATA:  Insert Insert EXAM: CHEST  2 VIEW COMPARISON:  None. FINDINGS: LEFT-sided port. Normal cardiac silhouette. No pulmonary edema. Lateral projection there LEFT lower lobe density. IMPRESSION: LEFT lower lobe atelectasis, effusion, or infiltrate. Electronically Signed   By:  Suzy Bouchard M.D.   On: 02/17/2016 16:05   Ct Head Wo Contrast  Result Date: 02/17/2016 CLINICAL DATA:  Dizziness x 2 days. Coughing for about couple months. Sob x 2 days. Pt denies chest pain. Pt denies headache. EXAM: CT HEAD WITHOUT CONTRAST TECHNIQUE: Contiguous axial images were obtained from the base of the skull through the vertex without intravenous contrast. COMPARISON:  07/02/2014 FINDINGS: Brain: No acute intracranial hemorrhage. No focal mass lesion. No CT evidence of acute infarction. No midline shift or mass effect. No hydrocephalus. Basilar cisterns are patent. Vascular: No hyperdense vessel or unexpected calcification. Skull: Normal. Negative for fracture or focal lesion. Sinuses/Orbits: Paranasal sinuses and mastoid air cells are clear. Orbits are clear. Other: None. IMPRESSION: No acute intracranial findings. Electronically Signed   By: Suzy Bouchard M.D.   On: 02/17/2016 22:35   Ct Angio Chest Pe W And/or Wo Contrast  Result Date: 02/17/2016 CLINICAL DATA:  Short of breath.  Tachycardia. EXAM: CT ANGIOGRAPHY CHEST WITH CONTRAST TECHNIQUE: Multidetector CT imaging of the chest was performed using the standard protocol during bolus administration of intravenous contrast. Multiplanar CT image reconstructions and MIPs were obtained to evaluate the vascular anatomy. CONTRAST:  100 mL Isovue COMPARISON:  None. FINDINGS: Cardiovascular: Tubular filling defects span the LEFT RIGHT main pulmonary artery (so-called saddle pulmonary emboli. Large destructive  pulmonary emboli extend into the proximal LEFT and RIGHT lower lobe pulmonary arteries. Emboli extend into the LEFT and RIGHT upper lobe pulmonary arteries. Evidence of RIGHT ventricular strain with the RIGHT ventricular to LEFT ventricular diameter ratio equal 1.46. Mediastinum/Nodes: No axillary or supraclavicular adenopathy. No mediastinal hilar adenopathy. Lungs/Pleura: There is a large layering LEFT pleural effusion. Mild LEFT basilar  atelectasis. Upper Abdomen: New mass in the gastrohepatic ligament measuring 6.2 x 6.3 cm (image 120, series 4 subcapsular lesion adjacent RIGHT hepatic lobe on image 125, series 4 similar to comparison CT Musculoskeletal: No aggressive osseous lesion. Review of the MIP images confirms the above findings. IMPRESSION: 1. Massive bilateral occlusive pulmonary emboli. CT evidence of right heart strain (RV/LV Ratio = 1.46) consistent with at least submassive (intermediate risk) PE. The presence of right heart strain has been associated with an increased risk of morbidity and mortality. Please activate Code PE by paging [phone]. 2. Large layering RIGHT pleural effusion. Concern for malignant effusion. 3. New large mass in the gastrohepatic ligament (6 cm) concerning for progression of ovarian carcinoma. Critical Value/emergent results were called by telephone at the time of interpretation on 02/17/2016 at 10:45 pm to Dr. Avie Echevaria , who verbally acknowledged these results. Electronically Signed   By: Suzy Bouchard M.D.   On: 02/17/2016 22:47   PACs images reviewed    DISCUSSION: History reviewed with patient and husband, consistent with fairly acute massive PEs. Agree with heparin qtt in process, noted discussion of thormbolytics in PCCM consult note. Patient understands that she will be closely monitored on heparin until clearly stablilzed and improved, then agree with lovenox indefinitely due to active recurrent ovarian malignancy. She needs to keep exertion to minimum now, agrees to use BSC or bedpan tonight. She will not have chemo as planned 02-22-16, this to be delayed until she is stable and out of hospital from this acute event. Likely will be best to hold avastin next cycle, and may decide to discontinue this, as there is 10-15% incidence of DVT with avastin (and obviously also hypercoagulable condition from the active gyn malignancy).    Assessment/Plan: 1. Massive bilateral PEs with LLE  DVT: very symptomatic but has been hemodynamically stable. Continuing heparin qtt with pharmacy monitoring, PCCM consulted, echocardiogram with right heart markedly dilated and pulmonary hypertension.  2.Recurrent ovarian carcinoma 14 months from completion of adjuvant therapy for IIIA1 hihg grade clear cell carcinoma of bilateral ovaries. Despite timing out from carboplatin and despite RAD51 mutation, she progressed with 2 cycles of carbo taxol 11-9 thru 12-28-15. Doxil + avastin begun 01-25-16. CTA chest 02-17-16 shows new mass in gastrohepatic ligament (? if comparison was CT AP 01-16-16 or CT chest 11-19-15). Hold doxil, hold or DC avastin until acute problem improved. Prognosis poor but she has wanted to try further treatment. 3.cough and clear rhinorrhea x weeks, has seemed likely allergic. Continue claritin and flonase, tessalon was not helpful previously. 4.PAC in , functioning well. Very difficult peripheral IV access 5.flu vaccine 11-14-15 6.CKD 3, creatinine higher when she uses diuretic 7.chemo and iron deficiency anemia: has needed PRBCs with chemo, had feraheme 12-2015  Please call if I can help between rounds   [phone] or med onc on call   Evlyn Clines  MD

## 2016-02-18 NOTE — Progress Notes (Signed)
[place] for Heparin  Indication: pulmonary embolus and +DVT  Allergies  Allergen Reactions  . Sulfa Antibiotics Hives   Patient Measurements: Height: 5\' 5"  (165.1 cm) Weight: 192 lb 6.4 oz (87.3 kg) IBW/kg (Calculated) : 57  Vital Signs: Temp: 97.8 F (36.6 C) (01/22 1559) Temp Source: Oral (01/22 1559) BP: 136/80 (01/22 1559) Pulse Rate: 114 (01/22 1559)  Labs:  Recent Labs  02/17/16 1435 02/18/16 1041 02/18/16 1926  HGB 10.1* 9.3*  --   HCT 29.4* 27.7*  --   PLT 204 159  --   HEPARINUNFRC  --  0.34 0.43  CREATININE 1.39* 1.31*  --   TROPONINI  --  0.03*  <0.03  --    Estimated Creatinine Clearance: 52.3 mL/min (by C-G formula based on SCr of 1.31 mg/dL (H)).  Medical History: Past Medical History:  Diagnosis Date  . Acid reflux   . Acute saddle pulmonary embolism with acute cor pulmonale (HCC)   . Anemia associated with chemotherapy   . Hypertension   . Ovarian ca [facility])    Assessment: 56 y/o F with recurrent ovarian CA on active chemo, shortness of breath x 1 week, cough x 2 months, starting heparin drip for new onset PE, +DVT noted. Initial HL 0.34 and repeat HL 0.43 remains in goal range.   Goal of Therapy:  Heparin level 0.3-0.7 units/ml Monitor platelets by anticoagulation protocol: Yes   Plan:  Continue IV heparin at 1250 units/hr Daily HL and CBC  Nishika Parkhurst S. Alford Highland, PharmD, Media Clinical Staff Pharmacist Pager [phone]  02/18/2016,8:25 PM

## 2016-02-18 NOTE — H&P (Signed)
History and Physical  Patient Name: Audrey Porter     [id number]    DOB: December 28, 1960    DOA: 02/17/2016 PCP: Gar Ponto, MD   Patient coming from: Home  Chief Complaint: Cough, dizziness, pre-syncope  HPI: Audrey Porter is a 56 y.o. female with a past medical history significant for metastatic ovarian CA who presents with PE.  Patient was in her usual state of health until last week when she started to notice shortness of breath and cough.  Then in the last 24 hours, the symptoms got much worse, and she felt dizzy with standing and almost passed out several times, so her husband brought her to the ER. He has had no fever, sputum production. She has chronic sinusitis, and had been treating her cough is related to that. She had no hemoptysis, no frank syncope, no leg swelling, no orthopnea, no PND. She has no previous history of DVT or PE.   ED course: -Afebrile, heart rate 113, respirations greater than 20, blood pressure 120/80, pulse oximetry low 90s on room air -Na 137, K 4.2, Cr 1.4 (baseline 1.1-1.4), WBC 5.1K, Hgb 10.1 -VBG suggested respiratory alkalosis -CT angiogram of the chest showed a saddle PE, RV and LV ratio 1.46, no pneumonia, new gastrohepatic ligament mass -She had a brief syncopal/unresponsive episode while being wheeled in a wheelchair to her room (no mention loss of pulse), recovered spontaneously and her BP remained stable in the ER -The case was discussed with PCCM who recommended hospitalist admission to stepdown and no urgent consideration of thrombolytics   She was diagnosed with ovarian cancer in 2016 March 2016: TAH[facility]O at [facility] October 2017 CT showed no progressive disease November 2017 restarted on carboplatin and Taxol,, treated by anemia December 2017 changed to Avastin Doxil      ROS: Review of Systems  Constitutional: Negative for chills and fever.  HENT: Positive for congestion.   Respiratory: Positive for cough and shortness of breath.     Cardiovascular: Negative for chest pain, palpitations and leg swelling.  Neurological: Positive for dizziness (and pre-syncope). Negative for loss of consciousness.  All other systems reviewed and are negative.         Past Medical History:  Diagnosis Date  . Acid reflux   . Hypertension   . Ovarian ca [facility])     Past Surgical History:  Procedure Laterality Date  . ABDOMINAL HYSTERECTOMY  03/31/14   at [facility]  . CESAREAN SECTION  1990  . CHOLECYSTECTOMY  2000  . IR GENERIC HISTORICAL  12/04/2015   IR FLUORO GUIDE PORT INSERTION RIGHT 12/04/2015 Greggory Keen, MD [facility]INTER[facility]  . IR GENERIC HISTORICAL  12/04/2015   IR US GUIDE VASC ACCESS RIGHT 12/04/2015 Greggory Keen, MD [facility]    Social History: Patient lives with her husband in [place].  The patient walks unassisted.  She was a Transport planner for [employer].  She does not smoke.    Allergies  Allergen Reactions  . Sulfa Antibiotics Hives    Family history: family history includes Cancer in her paternal grandmother; Cancer (age of onset: 81) in her maternal grandmother; Cancer (age of onset: 29) in her brother; Cancer (age of onset: 63) in her paternal aunt.  Prior to Admission medications   Medication Sig Start Date End Date Taking? Authorizing Provider  albuterol (PROVENTIL HFA;VENTOLIN HFA) 108 (90 Base) MCG/ACT inhaler Inhale 2 puffs into the lungs 2 (two) times daily as needed for wheezing or shortness of breath. 01/25/16  Lennis Marion Downer, MD  benzonatate (TESSALON) 100 MG capsule Take 1 capsule (100 mg total) by mouth 3 (three) times daily as needed for cough. 01/25/16   Lennis Marion Downer, MD  cetirizine (ZYRTEC) 5 MG tablet Take 5 mg by mouth daily.    Historical Provider, MD  Ferrous Fumarate (HEMOCYTE - 106 MG FE) 324 (106 Fe) MG TABS tablet Take 1 tablet by mouth daily. On an empty stomach with OJ.    Historical Provider, MD  fluticasone (FLONASE) 50 MCG/ACT nasal spray Place 1 spray into both nostrils  daily as needed. 12/21/15   Lennis Marion Downer, MD  ibuprofen (ADVIL,MOTRIN) 200 MG tablet Take 200-400 mg by mouth every 8 (eight) hours as needed.    Historical Provider, MD  lidocaine-prilocaine (EMLA) cream Apply 1-2 hours prior to [facility] access as directed. 12/03/15   Lennis Marion Downer, MD  LORazepam (ATIVAN) 0.5 MG tablet Place 1 tablet under the tongue or swallow every 6 hrs as needed for nausea. Will make drowsy. Patient not taking: Reported on 01/25/2016 12/03/15   Gordy Levan, MD  losartan-hydrochlorothiazide (HYZAAR) 100-25 MG per tablet Take 1 tablet by mouth daily. Pt takes this medication needed depending on her BP checks.    Historical Provider, MD  Multiple Vitamins-Minerals (CENTRUM SILVER ULTRA WOMENS PO) Take 1 tablet by mouth daily.    Historical Provider, MD  omeprazole (PRILOSEC) 20 MG capsule Take 20 mg by mouth at bedtime.    Historical Provider, MD  ondansetron (ZOFRAN) 8 MG tablet Take 1 tablet (8 mg total) by mouth every 8 (eight) hours as needed for nausea (Will not make drowsy). Patient not taking: Reported on 01/25/2016 12/03/15   Gordy Levan, MD  polyethylene glycol (MIRALAX / GLYCOLAX) packet Take 17 g by mouth 2 (two) times daily.     Historical Provider, MD       Physical Exam: BP 151/100   Pulse 111   Temp 98.7 F (37.1 C) (Oral)   Resp 24   Ht 5\' 5"  (1.651 m)   Wt 86.2 kg (190 lb)   SpO2 98%   BMI 31.62 kg/m  General appearance: Well-developed, adult female, alert and in no acute distress.   Eyes: Anicteric, conjunctiva pink, lids and lashes normal. PERRL.    ENT: No nasal deformity, discharge, epistaxis.  Hearing normal. OP moist without lesions.   Neck: No neck masses.  Trachea midline.  No thyromegaly/tenderness. Lymph: No cervical or supraclavicular lymphadenopathy. Skin: Warm and dry.  No jaundice.  No suspicious rashes or lesions. Cardiac: Tachycardic, nl S1-S2, no murmurs appreciated.  Capillary refill is brisk.  JVP normal.  No LE  edema.  Radial and DP pulses 2+ and symmetric. Respiratory: Somewhat tachypneic, not labored.  Shallow braeths, no rales or wheezes. Abdomen: Abdomen soft.  No TTP. No ascites, distension, hepatosplenomegaly.   MSK: No deformities or effusions.  No cyanosis or clubbing. Neuro: Cranial nerves normal.  Sensation intact to light touch. Speech is fluent.  Muscle strength normal.    Psych: Sensorium intact and responding to questions, attention normal.  Behavior appropriate.  Affect nromal.  Judgment and insight appear normal.     Labs on Admission:  I have personally reviewed following labs and imaging studies: CBC:  Recent Labs Lab 02/17/16 1435  WBC 5.1  HGB 10.1*  HCT 29.4*  MCV 87.5  PLT 0000000   Basic Metabolic Panel:  Recent Labs Lab 02/17/16 1435  NA 137  K 4.2  CL 105  CO2 23  GLUCOSE 127*  BUN 15  CREATININE 1.39*  CALCIUM 9.6   GFR: Estimated Creatinine Clearance: 49 mL/min (by C-G formula based on SCr of 1.39 mg/dL (H)).  Liver Function Tests: No results for input(s): AST, ALT, ALKPHOS, BILITOT, PROT, ALBUMIN in the last 168 hours. No results for input(s): LIPASE, AMYLASE in the last 168 hours. No results for input(s): AMMONIA in the last 168 hours. Coagulation Profile: No results for input(s): INR, PROTIME in the last 168 hours. Cardiac Enzymes: No results for input(s): CKTOTAL, CKMB, CKMBINDEX, TROPONINI in the last 168 hours. BNP (last 3 results) No results for input(s): PROBNP in the last 8760 hours. HbA1C: No results for input(s): HGBA1C in the last 72 hours. CBG: No results for input(s): GLUCAP in the last 168 hours. Lipid Profile: No results for input(s): CHOL, HDL, LDLCALC, TRIG, CHOLHDL, LDLDIRECT in the last 72 hours. Thyroid Function Tests: No results for input(s): TSH, T4TOTAL, FREET4, T3FREE, THYROIDAB in the last 72 hours. Anemia Panel: No results for input(s): VITAMINB12, FOLATE, FERRITIN, TIBC, IRON, RETICCTPCT in the last 72  hours. Sepsis Labs: Invalid input(s): PROCALCITONIN, LACTICIDVEN No results found for this or any previous visit (from the past 240 hour(s)).       Radiological Exams on Admission: Personally reviewed CXR shows no focal opacity; CT head and chest reports reviewed: Dg Chest 2 View  Result Date: 02/17/2016 CLINICAL DATA:  Insert Insert EXAM: CHEST  2 VIEW COMPARISON:  None. FINDINGS: LEFT-sided port. Normal cardiac silhouette. No pulmonary edema. Lateral projection there LEFT lower lobe density. IMPRESSION: LEFT lower lobe atelectasis, effusion, or infiltrate. Electronically Signed   By: Suzy Bouchard M.D.   On: 02/17/2016 16:05   Ct Head Wo Contrast  Result Date: 02/17/2016 CLINICAL DATA:  Dizziness x 2 days. Coughing for about couple months. Sob x 2 days. Pt denies chest pain. Pt denies headache. EXAM: CT HEAD WITHOUT CONTRAST TECHNIQUE: Contiguous axial images were obtained from the base of the skull through the vertex without intravenous contrast. COMPARISON:  07/02/2014 FINDINGS: Brain: No acute intracranial hemorrhage. No focal mass lesion. No CT evidence of acute infarction. No midline shift or mass effect. No hydrocephalus. Basilar cisterns are patent. Vascular: No hyperdense vessel or unexpected calcification. Skull: Normal. Negative for fracture or focal lesion. Sinuses/Orbits: Paranasal sinuses and mastoid air cells are clear. Orbits are clear. Other: None. IMPRESSION: No acute intracranial findings. Electronically Signed   By: Suzy Bouchard M.D.   On: 02/17/2016 22:35   Ct Angio Chest Pe W And/or Wo Contrast  Result Date: 02/17/2016 CLINICAL DATA:  Short of breath.  Tachycardia. EXAM: CT ANGIOGRAPHY CHEST WITH CONTRAST TECHNIQUE: Multidetector CT imaging of the chest was performed using the standard protocol during bolus administration of intravenous contrast. Multiplanar CT image reconstructions and MIPs were obtained to evaluate the vascular anatomy. CONTRAST:  100 mL Isovue  COMPARISON:  None. FINDINGS: Cardiovascular: Tubular filling defects span the LEFT RIGHT main pulmonary artery (so-called saddle pulmonary emboli. Large destructive pulmonary emboli extend into the proximal LEFT and RIGHT lower lobe pulmonary arteries. Emboli extend into the LEFT and RIGHT upper lobe pulmonary arteries. Evidence of RIGHT ventricular strain with the RIGHT ventricular to LEFT ventricular diameter ratio equal 1.46. Mediastinum/Nodes: No axillary or supraclavicular adenopathy. No mediastinal hilar adenopathy. Lungs/Pleura: There is a large layering LEFT pleural effusion. Mild LEFT basilar atelectasis. Upper Abdomen: New mass in the gastrohepatic ligament measuring 6.2 x 6.3 cm (image 120, series 4 subcapsular lesion adjacent RIGHT hepatic lobe on image  125, series 4 similar to comparison CT Musculoskeletal: No aggressive osseous lesion. Review of the MIP images confirms the above findings. IMPRESSION: 1. Massive bilateral occlusive pulmonary emboli. CT evidence of right heart strain (RV/LV Ratio = 1.46) consistent with at least submassive (intermediate risk) PE. The presence of right heart strain has been associated with an increased risk of morbidity and mortality. Please activate Code PE by paging [phone]. 2. Large layering RIGHT pleural effusion. Concern for malignant effusion. 3. New large mass in the gastrohepatic ligament (6 cm) concerning for progression of ovarian carcinoma. Critical Value/emergent results were called by telephone at the time of interpretation on 02/17/2016 at 10:45 pm to Dr. Avie Echevaria , who verbally acknowledged these results. Electronically Signed   By: Suzy Bouchard M.D.   On: 02/17/2016 22:47    EKG: Independently reviewed. Rate 114, QTc 457, sinus tachycardia.    Echocardiogram Dec 2017: EF 55-60%    Assessment/Plan  1. Acute saddle PE:  Currently tachycardia stable. -Agree with admission to stepdown unit given syncope -Gentle fluids -Heparin  gtt -Echocardiogram ordered -Doppler US of both legs -Consult to CCM, appreciate cares   2. Ovarian Ca:  Followed by Dr. Marko Plume.  Currently on Doxil and bevacizumab  3. History of hypertension:  Takes Hyzaar only PRN -Hold until hemodynamics clearer  4. 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  Stable  5. CKD III :  Baseline Cr 1.1-1.4, stable.  6. Other medications:  -Continue PRNs and allergy meds      DVT prophylaxis: N/A  Code Status: FULL  Family Communication: Husband at bedside; overnight plan dsicussed, CODE STATUS confirmed Disposition Plan: Anticipate IV heparin, echo and dopplers.  Change to Lovenox when stable and then discharge, likely in 2-3 days Consults called: PCCM Admission status: INPATIENT        Medical decision making: Patient seen at 1:09 AM on 02/18/2016.  The patient was discussed with Dr. Ellender Hose.  What exists of the patient's chart was reviewed in depth and summarized above.  Clinical condition: currently tachycardic and wtihout further syncope or any hypotension.  Will monitor closely in stepdown.      Edwin Dada [facility] Pager [phone]       At the time of admission, it appears that the appropriate admission status for this patient is INPATIENT. This is judged to be reasonable and necessary in order to provide the required intensity of service to ensure the patient's safety given the presenting symptoms, physical exam findings, and initial radiographic and laboratory data in the context of their chronic comorbidities.  Together, these circumstances are felt to place her at high risk for further clinical deterioration threatening life, limb, or organ.   Patient requires inpatient status due to high intensity of service, high risk for further deterioration and high frequency of surveillance required because of this acute illness that poses a threat to life, limb or bodily function.   Factors that support inpatient care  include: Massive PE with evidence of heart strain Underlying metastatic cancer  I certify that at the point of admission it is my clinical judgment that the patient will require inpatient hospital care spanning beyond 2 midnights from the point of admission and that early discharge would result in unnecessary risk of decompensation and readmission or threat to life, limb or bodily function.

## 2016-02-18 NOTE — Progress Notes (Signed)
[place] for Heparin  Indication: pulmonary embolus  Allergies  Allergen Reactions  . Sulfa Antibiotics Hives   Patient Measurements: Height: 5\' 5"  (165.1 cm) Weight: 192 lb 6.4 oz (87.3 kg) IBW/kg (Calculated) : 57  Vital Signs: Temp: 97.6 F (36.4 C) (01/22 1204) Temp Source: Oral (01/22 1204) BP: 107/71 (01/22 1102) Pulse Rate: 103 (01/22 1204)  Labs:  Recent Labs  02/17/16 1435 02/18/16 1041  HGB 10.1* 9.3*  HCT 29.4* 27.7*  PLT 204 159  HEPARINUNFRC  --  0.34  CREATININE 1.39* 1.31*  TROPONINI  --  0.03*  <0.03   Estimated Creatinine Clearance: 52.3 mL/min (by C-G formula based on SCr of 1.31 mg/dL (H)).  Medical History: Past Medical History:  Diagnosis Date  . Acid reflux   . Hypertension   . Ovarian ca [facility])    Assessment: 56 y/o F with recurrent ovarian CA on active chemo, shortness of breath x 1 week, cough x 2 months, starting heparin drip for new onset PE, Hgb 10.1, mild bump in Scr, PTA meds reviewed.   Initial heparin level = 0.34  Goal of Therapy:  Heparin level 0.3-0.7 units/ml Monitor platelets by anticoagulation protocol: Yes   Plan:  Increase heparin to 1250 units / hr to prevent drop to less than 0.3 Heparin level in 6 hours  Thank you Anette Guarneri, PharmD [phone] 02/18/2016,12:36 PM

## 2016-02-18 NOTE — Progress Notes (Signed)
PROGRESS NOTE  Audrey Porter  [id number] DOB: [birth year] DOA: 02/17/2016 PCP: Gar Ponto, MD Outpatient Specialists:  Subjective: Patient seen with her husband bedside, has some shortness of breath, denies any chest pain.  Brief Narrative:  Audrey Porter is a 56 y.o. female with a past medical history significant for metastatic ovarian CA who presents with PE.  Patient was in her usual state of health until last week when she started to notice shortness of breath and cough.  Then in the last 24 hours, the symptoms got much worse, and she felt dizzy with standing and almost passed out several times, so her husband brought her to the ER. He has had no fever, sputum production. She has chronic sinusitis, and had been treating her cough is related to that. She had no hemoptysis, no frank syncope, no leg swelling, no orthopnea, no PND. She has no previous history of DVT or PE.   Assessment & Plan:   Principal Problem:   Acute saddle pulmonary embolism with acute cor pulmonale (HCC) Active Problems:   Ovarian cancer, bilateral (HCC)   Environmental allergies   HTN (hypertension), benign   Anemia due to antineoplastic chemotherapy   CKD (chronic kidney disease) stage 3, GFR 30-59 ml/min   Patient seen earlier today by my colleague Dr. Loleta Books, this is a no charge. Admitted with acute saddle PE, I will check lower extremity Doppler. Discussed briefly with Dr. Marko Plume, her anticoagulant of choice is Lovenox.   1. Acute saddle PE:  Currently tachycardia stable. -Agree with admission to stepdown unit given syncope -Gentle fluids -Heparin gtt -Echocardiogram ordered -Doppler US of both legs -Consult to CCM, appreciate cares   2. Ovarian Ca:  Followed by Dr. Marko Plume.  Currently on Doxil and bevacizumab  3. History of hypertension:  Takes Hyzaar only PRN -Hold until hemodynamics clearer  4. 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  Stable  5. CKD III :  Baseline Cr 1.1-1.4,  stable.  6. Other medications:  -Continue PRNs and allergy meds    DVT prophylaxis:  Code Status: Full Code Family Communication:  Disposition Plan:  Diet: Diet regular Room service appropriate? Yes; Fluid consistency: Thin  Consultants:   PCCM  Procedures:   None  Antimicrobials:   None  Objective: Vitals:   02/18/16 0615 02/18/16 0702 02/18/16 1102 02/18/16 1204  BP: 131/94 127/88 107/71   Pulse: 99 (!) 104 (!) 101 (!) 103  Resp: 15 17 (!) 0   Temp:  97.6 F (36.4 C)  97.6 F (36.4 C)  TempSrc:  Oral  Oral  SpO2: 100% 99% 100% 100%  Weight:  87.3 kg (192 lb 6.4 oz)    Height:  5\' 5"  (1.651 m)      Intake/Output Summary (Last 24 hours) at 02/18/16 1214 Last data filed at 02/18/16 0930  Gross per 24 hour  Intake             2360 ml  Output                0 ml  Net             2360 ml   Filed Weights   02/17/16 1416 02/18/16 0702  Weight: 86.2 kg (190 lb) 87.3 kg (192 lb 6.4 oz)    Examination: General exam: Appears calm and comfortable  Respiratory system: Clear to auscultation. Respiratory effort normal. Cardiovascular system: S1 & S2 heard, RRR. No JVD, murmurs, rubs, gallops or clicks. No pedal edema. Gastrointestinal system: Abdomen is  nondistended, soft and nontender. No organomegaly or masses felt. Normal bowel sounds heard. Central nervous system: Alert and oriented. No focal neurological deficits. Extremities: Symmetric 5 x 5 power. Skin: No rashes, lesions or ulcers Psychiatry: Judgement and insight appear normal. Mood & affect appropriate.   Data Reviewed: I have personally reviewed following labs and imaging studies  CBC:  Recent Labs Lab 02/17/16 1435 02/18/16 1041  WBC 5.1 4.1  HGB 10.1* 9.3*  HCT 29.4* 27.7*  MCV 87.5 87.9  PLT 204 [id number]   Basic Metabolic Panel:  Recent Labs Lab 02/17/16 1435 02/18/16 1041  NA 137 141  K 4.2 4.1  CL 105 109  CO2 23 21*  GLUCOSE 127* 116*  BUN 15 15  CREATININE 1.39* 1.31*  CALCIUM  9.6 8.7*   GFR: Estimated Creatinine Clearance: 52.3 mL/min (by C-G formula based on SCr of 1.31 mg/dL (H)). Liver Function Tests: No results for input(s): AST, ALT, ALKPHOS, BILITOT, PROT, ALBUMIN in the last 168 hours. No results for input(s): LIPASE, AMYLASE in the last 168 hours. No results for input(s): AMMONIA in the last 168 hours. Coagulation Profile: No results for input(s): INR, PROTIME in the last 168 hours. Cardiac Enzymes:  Recent Labs Lab 02/18/16 1041  TROPONINI 0.03*  <0.03   BNP (last 3 results) No results for input(s): PROBNP in the last 8760 hours. HbA1C: No results for input(s): HGBA1C in the last 72 hours. CBG: No results for input(s): GLUCAP in the last 168 hours. Lipid Profile: No results for input(s): CHOL, HDL, LDLCALC, TRIG, CHOLHDL, LDLDIRECT in the last 72 hours. Thyroid Function Tests: No results for input(s): TSH, T4TOTAL, FREET4, T3FREE, THYROIDAB in the last 72 hours. Anemia Panel: No results for input(s): VITAMINB12, FOLATE, FERRITIN, TIBC, IRON, RETICCTPCT in the last 72 hours. Urine analysis:    Component Value Date/Time   LABSPEC 1.010 01/08/2015 1556   PHURINE 5.0 01/08/2015 1556   GLUCOSEU Negative 01/08/2015 1556   HGBUR Negative 01/08/2015 1556   BILIRUBINUR Negative 01/08/2015 1556   KETONESUR Negative 01/08/2015 1556   PROTEINUR 30 02/08/2016 1036   UROBILINOGEN 0.2 01/08/2015 1556   NITRITE Negative 01/08/2015 1556   LEUKOCYTESUR Negative 01/08/2015 1556   Sepsis Labs: @LABRCNTIP (procalcitonin:4,lacticidven:4)  ) Recent Results (from the past 240 hour(s))  MRSA PCR Screening     Status: None   Collection Time: 02/18/16  7:00 AM  Result Value Ref Range Status   MRSA by PCR NEGATIVE NEGATIVE Final    Comment:        The GeneXpert MRSA Assay (FDA approved for NASAL specimens only), is one component of a comprehensive MRSA colonization surveillance program. It is not intended to diagnose MRSA infection nor to guide  or monitor treatment for MRSA infections.      Invalid input(s): PROCALCITONIN, LACTICACIDVEN   Radiology Studies: Dg Chest 2 View  Result Date: 02/17/2016 CLINICAL DATA:  Insert Insert EXAM: CHEST  2 VIEW COMPARISON:  None. FINDINGS: LEFT-sided port. Normal cardiac silhouette. No pulmonary edema. Lateral projection there LEFT lower lobe density. IMPRESSION: LEFT lower lobe atelectasis, effusion, or infiltrate. Electronically Signed   By: Suzy Bouchard M.D.   On: 02/17/2016 16:05   Ct Head Wo Contrast  Result Date: 02/17/2016 CLINICAL DATA:  Dizziness x 2 days. Coughing for about couple months. Sob x 2 days. Pt denies chest pain. Pt denies headache. EXAM: CT HEAD WITHOUT CONTRAST TECHNIQUE: Contiguous axial images were obtained from the base of the skull through the vertex without intravenous contrast. COMPARISON:  07/02/2014  FINDINGS: Brain: No acute intracranial hemorrhage. No focal mass lesion. No CT evidence of acute infarction. No midline shift or mass effect. No hydrocephalus. Basilar cisterns are patent. Vascular: No hyperdense vessel or unexpected calcification. Skull: Normal. Negative for fracture or focal lesion. Sinuses/Orbits: Paranasal sinuses and mastoid air cells are clear. Orbits are clear. Other: None. IMPRESSION: No acute intracranial findings. Electronically Signed   By: Suzy Bouchard M.D.   On: 02/17/2016 22:35   Ct Angio Chest Pe W And/or Wo Contrast  Result Date: 02/17/2016 CLINICAL DATA:  Short of breath.  Tachycardia. EXAM: CT ANGIOGRAPHY CHEST WITH CONTRAST TECHNIQUE: Multidetector CT imaging of the chest was performed using the standard protocol during bolus administration of intravenous contrast. Multiplanar CT image reconstructions and MIPs were obtained to evaluate the vascular anatomy. CONTRAST:  100 mL Isovue COMPARISON:  None. FINDINGS: Cardiovascular: Tubular filling defects span the LEFT RIGHT main pulmonary artery (so-called saddle pulmonary emboli. Large  destructive pulmonary emboli extend into the proximal LEFT and RIGHT lower lobe pulmonary arteries. Emboli extend into the LEFT and RIGHT upper lobe pulmonary arteries. Evidence of RIGHT ventricular strain with the RIGHT ventricular to LEFT ventricular diameter ratio equal 1.46. Mediastinum/Nodes: No axillary or supraclavicular adenopathy. No mediastinal hilar adenopathy. Lungs/Pleura: There is a large layering LEFT pleural effusion. Mild LEFT basilar atelectasis. Upper Abdomen: New mass in the gastrohepatic ligament measuring 6.2 x 6.3 cm (image 120, series 4 subcapsular lesion adjacent RIGHT hepatic lobe on image 125, series 4 similar to comparison CT Musculoskeletal: No aggressive osseous lesion. Review of the MIP images confirms the above findings. IMPRESSION: 1. Massive bilateral occlusive pulmonary emboli. CT evidence of right heart strain (RV/LV Ratio = 1.46) consistent with at least submassive (intermediate risk) PE. The presence of right heart strain has been associated with an increased risk of morbidity and mortality. Please activate Code PE by paging [phone]. 2. Large layering RIGHT pleural effusion. Concern for malignant effusion. 3. New large mass in the gastrohepatic ligament (6 cm) concerning for progression of ovarian carcinoma. Critical Value/emergent results were called by telephone at the time of interpretation on 02/17/2016 at 10:45 pm to Dr. Avie Echevaria , who verbally acknowledged these results. Electronically Signed   By: Suzy Bouchard M.D.   On: 02/17/2016 22:47        Scheduled Meds: . Ferrous Fumarate  1 tablet Oral Q breakfast  . loratadine  10 mg Oral Daily  . pantoprazole  40 mg Oral Daily  . sodium chloride flush  3 mL Intravenous Q12H   Continuous Infusions: . sodium chloride 1,000 mL (02/18/16 0757)  . heparin 1,100 Units/hr (02/17/16 2242)     LOS: 1 day    Time spent: 35 minutes    Gwen Sarvis A, MD [facility] Pager [phone]  If  7PM-7AM, please contact night-coverage [email or web address] Password [facility] 02/18/2016, 12:14 PM

## 2016-02-18 NOTE — ED Notes (Signed)
Report called to 3W.

## 2016-02-19 LAB — CBC
HEMATOCRIT: 25.4 % — AB (ref 36.0–46.0)
Hemoglobin: 8.4 g/dL — ABNORMAL LOW (ref 12.0–15.0)
MCH: 29.4 pg (ref 26.0–34.0)
MCHC: 33.1 g/dL (ref 30.0–36.0)
MCV: 88.8 fL (ref 78.0–100.0)
Platelets: 170 10*3/uL (ref 150–400)
RBC: 2.86 MIL/uL — ABNORMAL LOW (ref 3.87–5.11)
RDW: 20.6 % — AB (ref 11.5–15.5)
WBC: 4.6 10*3/uL (ref 4.0–10.5)

## 2016-02-19 LAB — BASIC METABOLIC PANEL
ANION GAP: 9 (ref 5–15)
BUN: 15 mg/dL (ref 6–20)
CALCIUM: 8.7 mg/dL — AB (ref 8.9–10.3)
CO2: 20 mmol/L — AB (ref 22–32)
Chloride: 110 mmol/L (ref 101–111)
Creatinine, Ser: 1.27 mg/dL — ABNORMAL HIGH (ref 0.44–1.00)
GFR calc Af Amer: 54 mL/min — ABNORMAL LOW (ref >60)
GFR calc non Af Amer: 46 mL/min — ABNORMAL LOW (ref >60)
GLUCOSE: 94 mg/dL (ref 65–99)
Potassium: 3.9 mmol/L (ref 3.5–5.1)
Sodium: 139 mmol/L (ref 135–145)

## 2016-02-19 LAB — HEPARIN LEVEL (UNFRACTIONATED): Heparin Unfractionated: 0.48 IU/mL (ref 0.30–0.70)

## 2016-02-19 MED ORDER — ENOXAPARIN SODIUM 80 MG/0.8ML ~~LOC~~ SOLN
80.0000 mg | Freq: Two times a day (BID) | SUBCUTANEOUS | Status: DC
  Administered 2016-02-19 – 2016-02-21 (×5): 80 mg via SUBCUTANEOUS
  Filled 2016-02-19 (×5): qty 0.8

## 2016-02-19 MED ORDER — BOOST PLUS PO LIQD
237.0000 mL | Freq: Two times a day (BID) | ORAL | Status: DC
  Administered 2016-02-19 – 2016-02-20 (×3): 237 mL via ORAL
  Filled 2016-02-19 (×7): qty 237

## 2016-02-19 MED ORDER — FUROSEMIDE 10 MG/ML IJ SOLN
40.0000 mg | Freq: Once | INTRAMUSCULAR | Status: AC
  Administered 2016-02-19: 40 mg via INTRAVENOUS
  Filled 2016-02-19: qty 4

## 2016-02-19 NOTE — Progress Notes (Addendum)
Initial Nutrition Assessment  DOCUMENTATION CODES:   Severe malnutrition in context of chronic illness, Obesity unspecified  INTERVENTION:    Boost Plus PO BID, each supplement provides 360 kcal and 14 gm protein  D/C Ensure  NUTRITION DIAGNOSIS:   Malnutrition related to chronic illness as evidenced by energy intake < or equal to 75% for > or equal to 1 month, percent weight loss (6% within 1 month).  GOAL:   Patient will meet greater than or equal to 90% of their needs  MONITOR:   PO intake, Supplement acceptance, Labs  REASON FOR ASSESSMENT:   Malnutrition Screening Tool    ASSESSMENT:   56 y.o. female with a past medical history significant for metastatic ovarian CA who presents with PE  Patient with 6% weight loss within the past month. She reports poor appetite and poor intake for the past 1-2 months. Around Thanksgiving she was eating well, but since that time she has been eating poorly. Suspect intake has been </= 75% of estimated energy requirement for >/= 1 month. She has been drinking Boost at home. Prefers Boost over Ensure, as she had some abdominal discomfort after she drank Ensure yesterday. She has been eating "okay" since admission. Nutrition-Focused physical exam completed. Findings are no fat depletion, mild muscle depletion, and no edema.  Patient with severe PCM. Labs and medications reviewed.  Diet Order:  Diet regular Room service appropriate? Yes; Fluid consistency: Thin  Skin:  Reviewed, no issues  Last BM:  1/23  Height:   Ht Readings from Last 1 Encounters:  02/18/16 5\' 5"  (1.651 m)    Weight:   Wt Readings from Last 1 Encounters:  02/18/16 192 lb 6.4 oz (87.3 kg)    Ideal Body Weight:  56.8 kg  BMI:  Body mass index is 32.02 kg/m.  Estimated Nutritional Needs:   Kcal:  1700-1900  Protein:  85-95 gm  Fluid:  1.7-1.9 L  EDUCATION NEEDS:   No education needs identified at this time  Molli Barrows, [place], [place],  [place] Pager [phone] After Hours Pager [phone]

## 2016-02-19 NOTE — Progress Notes (Addendum)
[place] for Heparin  Indication: pulmonary embolus x 2 / DVT  Allergies  Allergen Reactions  . Sulfa Antibiotics Hives   Patient Measurements: Height: 5\' 5"  (165.1 cm) Weight: 192 lb 6.4 oz (87.3 kg) IBW/kg (Calculated) : 57  Vital Signs: Temp: 97.8 F (36.6 C) (01/23 0817) Temp Source: Oral (01/23 0817) BP: 130/90 (01/23 0817) Pulse Rate: 98 (01/23 0817)  Labs:  Recent Labs  02/17/16 1435 02/18/16 1041 02/18/16 1926 02/19/16 0514  HGB 10.1* 9.3*  --  8.4*  HCT 29.4* 27.7*  --  25.4*  PLT 204 159  --  170  HEPARINUNFRC  --  0.34 0.43 0.48  CREATININE 1.39* 1.31*  --  1.27*  TROPONINI  --  0.03*  <0.03  --   --    Estimated Creatinine Clearance: 54 mL/min (by C-G formula based on SCr of 1.27 mg/dL (H)).  Medical History: Past Medical History:  Diagnosis Date  . Acid reflux   . Acute saddle pulmonary embolism with acute cor pulmonale (HCC)   . Anemia associated with chemotherapy   . Hypertension   . Ovarian ca [facility])    Assessment: 56 y/o F with recurrent ovarian CA on active chemo, shortness of breath x 1 week, cough x 2 months, continues on heparin for PE x 2 and DVT  Heparin level therapeutic this AM, transitioning to Lovenox this AM  HgB trending down  Goal of Therapy:  Heparin level 0.3-0.7 units/ml Monitor platelets by anticoagulation protocol: Yes   Plan:  Continue heparin at 1250 units / hr Follow up AM labs  Likely to transition to Lovenox for long term anti-coagulation  Thank you Anette Guarneri, PharmD [phone] 02/19/2016,8:32 AM   11 AM: DC heparin, begin Lovenox 80 mg sq Q 12 hours  Thank you Anette Guarneri, PharmD [phone]

## 2016-02-19 NOTE — Progress Notes (Signed)
PROGRESS NOTE  Audrey Porter  [id number] DOB: 09/20/1960 DOA: 02/17/2016 PCP: Gar Ponto, MD Outpatient Specialists:  Subjective: Seen with husband at bedside, some shortness of breath and mild chest pain on deep inspiration. Doppler is positive for DVT  Brief Narrative:  Audrey Porter is a 56 y.o. female with a past medical history significant for metastatic ovarian CA who presents with PE.  Patient was in her usual state of health until last week when she started to notice shortness of breath and cough.  Then in the last 24 hours, the symptoms got much worse, and she felt dizzy with standing and almost passed out several times, so her husband brought her to the ER. He has had no fever, sputum production. She has chronic sinusitis, and had been treating her cough is related to that. She had no hemoptysis, no frank syncope, no leg swelling, no orthopnea, no PND. She has no previous history of DVT or PE.   Assessment & Plan:   Principal Problem:   Acute saddle pulmonary embolism with acute cor pulmonale (HCC) Active Problems:   Ovarian cancer, bilateral (HCC)   Environmental allergies   HTN (hypertension), benign   Anemia due to antineoplastic chemotherapy   CKD (chronic kidney disease) stage 3, GFR 30-59 ml/min    Acute saddle PE and acute DVT Presented to the hospital with shortness of breath and mild pain on deep inspiration. CT scan showed saddle PE, patient hemodynamically stable. PEs likely secondary to active cancer, has recurrent ovarian cancer follows with Dr. Marko Plume. PCCM consulted, recommended to continue anticoagulation, no thrombolysis or IVC filter indicated at this time. Started on heparin, Dr. Marko Plume recommended Lovenox will stop heparin infusion and start subcutaneous Lovenox. Wean oxygen and ambulate expect discharge in 1-2 days.  Ovarian Ca:  Followed by Dr. Marko Plume.  Currently on Doxil and bevacizumab  History of hypertension:  Takes Hyzaar  only PRN -Hold until hemodynamics clearer  Anemia:  From chemo.  Stable  CKD III :  Baseline Cr 1.1-1.4, stable.  Other medications:  -Continue PRNs and allergy meds   DVT prophylaxis: Hepatitic Lovenox Code Status: Full Code Family Communication:  Disposition Plan:  Diet: Diet regular Room service appropriate? Yes; Fluid consistency: Thin  Consultants:   PCCM.  Oncology  Procedures:   None  Antimicrobials:   None  Objective: Vitals:   02/18/16 1559 02/18/16 2100 02/19/16 0522 02/19/16 0817  BP: 136/80 (!) 150/86 126/87 130/90  Pulse: (!) 114 (!) 109 99 98  Resp: (!) 21 20 (!) 25   Temp: 97.8 F (36.6 C) 98.2 F (36.8 C) 98.6 F (37 C) 97.8 F (36.6 C)  TempSrc: Oral Oral Oral Oral  SpO2: 99% 100% 99% 99%  Weight:      Height:        Intake/Output Summary (Last 24 hours) at 02/19/16 1045 Last data filed at 02/19/16 0600  Gross per 24 hour  Intake          2815.42 ml  Output                0 ml  Net          2815.42 ml   Filed Weights   02/17/16 1416 02/18/16 0702  Weight: 86.2 kg (190 lb) 87.3 kg (192 lb 6.4 oz)    Examination: General exam: Appears calm and comfortable  Respiratory system: Clear to auscultation. Respiratory effort normal. Cardiovascular system: S1 & S2 heard, RRR. No JVD, murmurs, rubs, gallops or clicks.  No pedal edema. Gastrointestinal system: Abdomen is nondistended, soft and nontender. No organomegaly or masses felt. Normal bowel sounds heard. Central nervous system: Alert and oriented. No focal neurological deficits. Extremities: Symmetric 5 x 5 power. Skin: No rashes, lesions or ulcers Psychiatry: Judgement and insight appear normal. Mood & affect appropriate.   Data Reviewed: I have personally reviewed following labs and imaging studies  CBC:  Recent Labs Lab 02/17/16 1435 02/18/16 1041 02/19/16 0514  WBC 5.1 4.1 4.6  HGB 10.1* 9.3* 8.4*  HCT 29.4* 27.7* 25.4*  MCV 87.5 87.9 88.8  PLT 204 159 [id number]    Basic Metabolic Panel:  Recent Labs Lab 02/17/16 1435 02/18/16 1041 02/19/16 0514  NA 137 141 139  K 4.2 4.1 3.9  CL 105 109 110  CO2 23 21* 20*  GLUCOSE 127* 116* 94  BUN 15 15 15   CREATININE 1.39* 1.31* 1.27*  CALCIUM 9.6 8.7* 8.7*   GFR: Estimated Creatinine Clearance: 54 mL/min (by C-G formula based on SCr of 1.27 mg/dL (H)). Liver Function Tests: No results for input(s): AST, ALT, ALKPHOS, BILITOT, PROT, ALBUMIN in the last 168 hours. No results for input(s): LIPASE, AMYLASE in the last 168 hours. No results for input(s): AMMONIA in the last 168 hours. Coagulation Profile: No results for input(s): INR, PROTIME in the last 168 hours. Cardiac Enzymes:  Recent Labs Lab 02/18/16 1041  TROPONINI 0.03*  <0.03   BNP (last 3 results) No results for input(s): PROBNP in the last 8760 hours. HbA1C: No results for input(s): HGBA1C in the last 72 hours. CBG: No results for input(s): GLUCAP in the last 168 hours. Lipid Profile: No results for input(s): CHOL, HDL, LDLCALC, TRIG, CHOLHDL, LDLDIRECT in the last 72 hours. Thyroid Function Tests: No results for input(s): TSH, T4TOTAL, FREET4, T3FREE, THYROIDAB in the last 72 hours. Anemia Panel: No results for input(s): VITAMINB12, FOLATE, FERRITIN, TIBC, IRON, RETICCTPCT in the last 72 hours. Urine analysis:    Component Value Date/Time   LABSPEC 1.010 01/08/2015 1556   PHURINE 5.0 01/08/2015 1556   GLUCOSEU Negative 01/08/2015 1556   HGBUR Negative 01/08/2015 1556   BILIRUBINUR Negative 01/08/2015 1556   KETONESUR Negative 01/08/2015 1556   PROTEINUR 30 02/08/2016 1036   UROBILINOGEN 0.2 01/08/2015 1556   NITRITE Negative 01/08/2015 1556   LEUKOCYTESUR Negative 01/08/2015 1556   Sepsis Labs: @LABRCNTIP (procalcitonin:4,lacticidven:4)  ) Recent Results (from the past 240 hour(s))  MRSA PCR Screening     Status: None   Collection Time: 02/18/16  7:00 AM  Result Value Ref Range Status   MRSA by PCR NEGATIVE  NEGATIVE Final    Comment:        The GeneXpert MRSA Assay (FDA approved for NASAL specimens only), is one component of a comprehensive MRSA colonization surveillance program. It is not intended to diagnose MRSA infection nor to guide or monitor treatment for MRSA infections.      Invalid input(s): PROCALCITONIN, LACTICACIDVEN   Radiology Studies: Dg Chest 2 View  Result Date: 02/17/2016 CLINICAL DATA:  Insert Insert EXAM: CHEST  2 VIEW COMPARISON:  None. FINDINGS: LEFT-sided port. Normal cardiac silhouette. No pulmonary edema. Lateral projection there LEFT lower lobe density. IMPRESSION: LEFT lower lobe atelectasis, effusion, or infiltrate. Electronically Signed   By: Suzy Bouchard M.D.   On: 02/17/2016 16:05   Ct Head Wo Contrast  Result Date: 02/17/2016 CLINICAL DATA:  Dizziness x 2 days. Coughing for about couple months. Sob x 2 days. Pt denies chest pain. Pt denies headache. EXAM: CT  HEAD WITHOUT CONTRAST TECHNIQUE: Contiguous axial images were obtained from the base of the skull through the vertex without intravenous contrast. COMPARISON:  07/02/2014 FINDINGS: Brain: No acute intracranial hemorrhage. No focal mass lesion. No CT evidence of acute infarction. No midline shift or mass effect. No hydrocephalus. Basilar cisterns are patent. Vascular: No hyperdense vessel or unexpected calcification. Skull: Normal. Negative for fracture or focal lesion. Sinuses/Orbits: Paranasal sinuses and mastoid air cells are clear. Orbits are clear. Other: None. IMPRESSION: No acute intracranial findings. Electronically Signed   By: Suzy Bouchard M.D.   On: 02/17/2016 22:35   Ct Angio Chest Pe W And/or Wo Contrast  Result Date: 02/17/2016 CLINICAL DATA:  Short of breath.  Tachycardia. EXAM: CT ANGIOGRAPHY CHEST WITH CONTRAST TECHNIQUE: Multidetector CT imaging of the chest was performed using the standard protocol during bolus administration of intravenous contrast. Multiplanar CT image  reconstructions and MIPs were obtained to evaluate the vascular anatomy. CONTRAST:  100 mL Isovue COMPARISON:  None. FINDINGS: Cardiovascular: Tubular filling defects span the LEFT RIGHT main pulmonary artery (so-called saddle pulmonary emboli. Large destructive pulmonary emboli extend into the proximal LEFT and RIGHT lower lobe pulmonary arteries. Emboli extend into the LEFT and RIGHT upper lobe pulmonary arteries. Evidence of RIGHT ventricular strain with the RIGHT ventricular to LEFT ventricular diameter ratio equal 1.46. Mediastinum/Nodes: No axillary or supraclavicular adenopathy. No mediastinal hilar adenopathy. Lungs/Pleura: There is a large layering LEFT pleural effusion. Mild LEFT basilar atelectasis. Upper Abdomen: New mass in the gastrohepatic ligament measuring 6.2 x 6.3 cm (image 120, series 4 subcapsular lesion adjacent RIGHT hepatic lobe on image 125, series 4 similar to comparison CT Musculoskeletal: No aggressive osseous lesion. Review of the MIP images confirms the above findings. IMPRESSION: 1. Massive bilateral occlusive pulmonary emboli. CT evidence of right heart strain (RV/LV Ratio = 1.46) consistent with at least submassive (intermediate risk) PE. The presence of right heart strain has been associated with an increased risk of morbidity and mortality. Please activate Code PE by paging [phone]. 2. Large layering RIGHT pleural effusion. Concern for malignant effusion. 3. New large mass in the gastrohepatic ligament (6 cm) concerning for progression of ovarian carcinoma. Critical Value/emergent results were called by telephone at the time of interpretation on 02/17/2016 at 10:45 pm to Dr. Avie Echevaria , who verbally acknowledged these results. Electronically Signed   By: Suzy Bouchard M.D.   On: 02/17/2016 22:47        Scheduled Meds: . feeding supplement (ENSURE ENLIVE)  237 mL Oral BID BM  . Ferrous Fumarate  1 tablet Oral Q breakfast  . loratadine  10 mg Oral Daily  .  LORazepam  0.5 mg Oral Once  . pantoprazole  40 mg Oral Daily  . sodium chloride flush  3 mL Intravenous Q12H   Continuous Infusions: . sodium chloride 100 mL/hr at 02/19/16 0523  . heparin 1,250 Units/hr (02/18/16 1439)     LOS: 2 days    Time spent: 35 minutes    Theoren Palka A, MD [facility] Pager [phone]  If 7PM-7AM, please contact night-coverage [email or web address] Password [email or web address] 02/19/2016, 10:45 AM

## 2016-02-19 NOTE — Plan of Care (Signed)
Problem: Safety: Goal: Ability to remain free from injury will improve Outcome: Completed/Met Date Met: 02/19/16 Call light and phone in reach, Bed Rails up X2, pt compliant with calling for assistance when needed.  Problem: Pain Managment: Goal: General experience of comfort will improve Outcome: Completed/Met Date Met: 02/19/16 No complaints of pain or discomfort.  Problem: Tissue Perfusion: Goal: Risk factors for ineffective tissue perfusion will decrease Outcome: Progressing Progressed from IV heparin to xarelto  Problem: Activity: Goal: Risk for activity intolerance will decrease Outcome: Progressing Ambulating in room with standby assistance

## 2016-02-20 ENCOUNTER — Telehealth: Payer: Self-pay | Admitting: Hematology and Oncology

## 2016-02-20 ENCOUNTER — Other Ambulatory Visit: Payer: Self-pay | Admitting: Oncology

## 2016-02-20 LAB — BASIC METABOLIC PANEL
Anion gap: 9 (ref 5–15)
BUN: 16 mg/dL (ref 6–20)
CHLORIDE: 109 mmol/L (ref 101–111)
CO2: 23 mmol/L (ref 22–32)
CREATININE: 1.46 mg/dL — AB (ref 0.44–1.00)
Calcium: 9 mg/dL (ref 8.9–10.3)
GFR calc Af Amer: 45 mL/min — ABNORMAL LOW (ref >60)
GFR calc non Af Amer: 39 mL/min — ABNORMAL LOW (ref >60)
Glucose, Bld: 100 mg/dL — ABNORMAL HIGH (ref 65–99)
POTASSIUM: 3.6 mmol/L (ref 3.5–5.1)
Sodium: 141 mmol/L (ref 135–145)

## 2016-02-20 LAB — CBC
HEMATOCRIT: 25.8 % — AB (ref 36.0–46.0)
HEMOGLOBIN: 8.6 g/dL — AB (ref 12.0–15.0)
MCH: 29.6 pg (ref 26.0–34.0)
MCHC: 33.3 g/dL (ref 30.0–36.0)
MCV: 88.7 fL (ref 78.0–100.0)
Platelets: 173 10*3/uL (ref 150–400)
RBC: 2.91 MIL/uL — AB (ref 3.87–5.11)
RDW: 20.7 % — ABNORMAL HIGH (ref 11.5–15.5)
WBC: 3.8 10*3/uL — ABNORMAL LOW (ref 4.0–10.5)

## 2016-02-20 MED ORDER — SUCRALFATE 1 G PO TABS
1.0000 g | ORAL_TABLET | Freq: Three times a day (TID) | ORAL | Status: DC
  Administered 2016-02-20 – 2016-02-21 (×5): 1 g via ORAL
  Filled 2016-02-20 (×5): qty 1

## 2016-02-20 MED ORDER — SODIUM CHLORIDE 0.9% FLUSH
10.0000 mL | INTRAVENOUS | Status: DC | PRN
  Administered 2016-02-20 – 2016-02-21 (×2): 10 mL
  Filled 2016-02-20: qty 40

## 2016-02-20 MED ORDER — ENOXAPARIN (LOVENOX) PATIENT EDUCATION KIT: PACK | Freq: Once | Status: DC

## 2016-02-20 NOTE — Plan of Care (Signed)
Problem: Health Behavior/Discharge Planning: Goal: Ability to manage health-related needs will improve Outcome: Completed/Met Date Met: 02/20/16 Patient instructed on administration of subQ lovenox with return demonstration.  Problem: Activity: Goal: Risk for activity intolerance will decrease Outcome: Progressing Pt ambulating in hall once per shift  Problem: Fluid Volume: Goal: Ability to maintain a balanced intake and output will improve Outcome: Progressing Supplemental nutrition drink given between meals

## 2016-02-20 NOTE — Telephone Encounter (Signed)
Added f/u with NG 2/9 per staff messages from NG/LL. Time of 2/9 appointments adjusted due to add on. Patient to get new schedule at 1/25 visit with LL.

## 2016-02-20 NOTE — Care Management Note (Signed)
Case Management Note  Patient Details  Name: Audrey Porter MRN: [place]:[id number] Date of Birth: 09-12-1960  Subjective/Objective: Pt presented for SOB positive for PE. Plan will be for home on Lovenox. CM will make pt aware of cost. [facility] [place]. [facility] and [facility] is available.                Action/Plan Benefits Check completed and cost will be:  S/W TAMMY @ [facility] # [phone]   LOVENOX  COVER- YES  CO-PAY- $ 82.51  PRIOR APPROVAL- NO   ENOXAPARIN  COVER- YES  CO-PAY- $ 10.95  PRIOR APPROVAL-NO   [facility]   Expected Discharge Date:                  Expected Discharge Plan:  Home/Self Care  In-House Referral:  NA  Discharge planning Services  CM Consult, Medication Assistance  Post Acute Care Choice:  NA Choice offered to:  NA  DME Arranged:  N/A DME Agency:  NA  HH Arranged:  NA HH Agency:  NA  Status of Service:  Completed, signed off  If discussed at [place] of Stay Meetings, dates discussed:    Additional Comments:  Bethena Roys, RN 02/20/2016, 1:48 PM

## 2016-02-20 NOTE — Progress Notes (Signed)
Medical Oncology February 20, 2016, 8:15 AM  Hospital day 4 Anticoagulation day 4. Heparin changed to lovenox 02-19-16 by primary service  Chemotherapy: cycle 1 doxil 01-25-16, avastin 01-25-16 and 02-08-16 Outpatient physicians: Audrey Porter, Audrey Porter (PCP), Audrey Porter, Audrey Porter   Case Manager: please be sure she will be able to obtain lovenox when discharged. Note she needs review re self-administering lovenox  EMR reviewed, patient seen with daughter here.  Daughter is 4th year Careers information officer.   Subjective: Is improving. Yesterday able to go back and forth to bathroom, and at 0400 today walked in hall, some SOB with that as she completed walking. No bleeding. No chest pain. Oxygen now weaned down to 1 liter. No discomfort or swelling LLE, recalls one episode of "cramp" in that leg PTA.  Still some cough, NP. Still some post nasal drainage, does not think hospital claritin as helpful as home zyrtec. More reflux, which she feels now, despite on protonix. Upper abdominal discomfort when coughs. Appetite has been a little better, able to eat yogurt and fruit, taste ok for some foods. Bowels ok. PAC fine, IVF DCd 1-23 AM per patient.   NOTE she is always very stoic,  does not complain.  PAC placed by IR 12-04-15 Flu vaccine 11-14-15 CA 125 preoperatively 56, down to 5 on 09-14-14. Baseline for chemo for recurrence 11-30-15 32.4, increased to 74.6 after 2 cycles carbo taxol. 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 Feraheme 01-14-16, 02-08-16  ONCOLOGIC HISTORY Patient developed bladder symptoms in early 2016. Urine culture showed no infection, then CT AP at [facility] 03-16-14 found perihepatic ascites and pelvic mass 9.9 x 20.1 x 17.8 cm which was cystic and solid, kidneys and ureters unremarkable, no pelvic fluid, rectosigmoid partially compressed, other organs not remarkable, no mention of adenopathy. She was seen  by Dr Audrey Porter of gyn, CT findings confirmed by transvaginal US. CA 125 was 56 pre op. She saw Dr Audrey Porter in consultation 03-23-14, then surgery at [facility] 03-31-14 TAH/BSO, omentectomy, pelvic and paraaortic node evaluation. Surgical findings were of bilateral complex ovarian masses and dense adhesions, with intra-operative spill of right ovarian mass whichwas densely adherent to sigmoid serosa; posterior uterine segment also adherent to rectosigmoid, and retroperitoneal fibrosis bilaterally. 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 Cytology from ascites [facility] [phone]) positive for adenocarcinoma of gyn primary. [facility] The multidisciplinary conference 04-19-14 recommended carboplatin and taxane chemotherapy. Cycle 1 carbo taxol was given on 05-12-14, with neutropenia by day 11 (ANC 0.8), neulasta as on pro more helpful than granix.She was hospitalized at [facility] after cycle 3 with syncope related to dehydration. She completed cycle 6 on 09-01-14. PET 08-2014 no clear residual or progressive disease; colonoscopy was unremarkable. She had peripheral neuropathy with taxol. CA 125 was 7.5 in 06-2015, then up to 18.4 by 10-2015. CT CAP 11-19-15 showed new progression subcapsular areas of liver, at proximal sigmoid colon and in mesentery, with small ascites. She began chemotherapy for recurrence with carbo taxol on 12-06-15, with on pro neulasta. She had 2 units PRBCs for hemoglobin down to 7.5 o11-24-17. She had increasing abdominal discomfort and increase in CA 125 marker from 32 as baseline for retreatment carbo taxol up to 49 in late Nov and 74 after cycle 2 chemo, which was given 12-28-15. CT AP 01-16-16 showed  progression around liver, with lesser increase in region of sigmoid colon (platinum refractory disease). Treatment changed to doxil with avastin  01-25-16, with avastin also on 02-08-16.  She was admitted with massive PEs found 02-17-16.   Objective: Vital signs in last 24 hours: Blood pressure (!) 147/93, pulse 97, temperature 98.7 F (37.1 C), temperature source Oral, resp. rate 19, height _0  (1.651 m), weight 193 lb 3.2 oz (87.6 kg), SpO2 100 %. Awake, alert, respirations not labored supine in bed with [place] O2 at 1 liter. Looks better and more comfortable overall than at my last visit. Cough x1 during visit. Mucous membranes a little pale. Oral mucosa moist without lesions. No JVD supine. PAC site ok, no IVF now. Lungs decreased BS anteriorly, no wheezes or rubs. Heart RRR no gallop. Abdomen soft, not tender to palpation upper quadrants or epigastrium. A few BS. LE no edema, cords, tenderness either side. Moves and repositions easily in bed.  Intake/Output from previous day: 01/23 0701 - 01/24 0700 In: 250 [P.O.:240; I.V.:10] Out: -  Intake/Output this shift: No intake/output data recorded.    Lab Results:  Recent Labs  02/19/16 0514 02/20/16 0531  WBC 4.6 3.8*  HGB 8.4* 8.6*  HCT 25.4* 25.8*  PLT 170 173   BMET  Recent Labs  02/19/16 0514 02/20/16 0531  NA 139 141  K 3.9 3.6  CL 110 109  CO2 20* 23  GLUCOSE 94 100*  BUN 15 16  CREATININE 1.27* 1.46*  CALCIUM 8.7* 9.0  Creatinine today some higher than usual baseline.  Studies/Results: CTA chest on admission, with massive bilateral PEs, left pleural effusion, right heart strain, 6 cm mass in gastrohepatic ligament   Assessment/Plan:  1. Massive bilateral PEs with LLE DVT: very symptomatic, including episode of unresponsiveness in ED, but has been hemodynamically stable. Heparin qtt changed to lovenox by primary service on 02-19-16.  PCCM consulted on admission. Echocardiogram with right heart markedly dilated and pulmonary hypertension.  Need to be sure she is stable prior to DC. 2.Recurrent ovarian carcinoma 14 months from completion of adjuvant therapy  for IIIA1 hihg grade clear cell carcinoma of bilateral ovaries. Despite timing out from carboplatin and despite RAD51 mutation, she progressed with 2 cycles of carbo taxol 11-9 thru 12-28-15. Doxil + avastin begun 01-25-16. CTA chest 02-17-16 shows new mass in gastrohepatic ligament (? if comparison was CT AP 01-16-16 or CT chest 11-19-15). Hold doxil / no treatment at [facility] on 02-22-16 , hold or DC avastin until acute problem improved. Prognosis poor but she has wanted to try further treatment. 3.cough and clear rhinorrhea x weeks, has seemed likely allergic. Continue claritin and flonase, tessalon was not helpful previously. Encouraged her also to continue meds for GERD, add carafate now 4.PAC in , functioning well. Very difficult peripheral IV access 5.flu vaccine 11-14-15 6.CKD 3, creatinine 1.46 a bit higher today than usual. Note she had lasix on 1-23 7 Anemia multifactorial, with chemo, CKD and previous iron deficiency: has needed PRBCs during chemo, given  feraheme 12-2015 and 02-08-16.   I will make changes to appointments at [facility]. Please be sure she also follows up with PCP Dr Quillian Quince in [place]. Please call if I can help between rounds [phone], or med onc on call  Evlyn Clines MD

## 2016-02-20 NOTE — Progress Notes (Addendum)
PROGRESS NOTE  TOCARRA SPIKES  [id number] DOB: May 30, 1960 DOA: 02/17/2016 PCP: Gar Ponto, MD Outpatient Specialists:  Subjective: Breathing improving, still dyspneic with minimal activity   Brief Narrative:  Audrey Porter is a 56 y.o. female with a past medical history significant for metastatic ovarian CA who admitted with Extensive PE with RV  Strain and R heart dilation. Seen by PCCM and Onc  Assessment & Plan:   Massive Bilateral PE and acute DVT -CT scan showed massive B/L PE  -ECHO with severely dilated Rv and moderately decreased RV fucntion -Seen by PCCM this admission, no thrombolytics recommended -Improving, tachycardiac, HR in 100s-120s range this am -Switched to SQ lovenox yesterday -wean O2 -Lovenox teaching -Home tomorrow if hemodynamics improved and stable  Ovarian Ca:  -followed by Dr. Marko Plume.  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  Other medications:  -Continue PRNs and allergy meds   DVT prophylaxis: Lovenox Code Status: Full Code Family Communication:  Disposition Plan: Home tomorrow if hemodynamics stable Diet: Diet regular Room service appropriate? Yes; Fluid consistency: Thin  Consultants:   PCCM.  Oncology  Procedures:   None  Antimicrobials:   None  Objective: Vitals:   02/20/16 0200 02/20/16 0428 02/20/16 0836 02/20/16 1151  BP:  (!) 147/93 (!) 145/89 125/88  Pulse: 91 97 94 94  Resp: (!) 26 19 (!) 27 (!) 23  Temp:  98.7 F (37.1 C) 98.5 F (36.9 C) 98.5 F (36.9 C)  TempSrc:  Oral Oral Oral  SpO2: 99% 100% 98% 100%  Weight:  87.6 kg (193 lb 3.2 oz)    Height:        Intake/Output Summary (Last 24 hours) at 02/20/16 1213 Last data filed at 02/20/16 0531  Gross per 24 hour  Intake              250 ml  Output                0 ml  Net              250 ml   Filed Weights   02/17/16 1416 02/18/16 0702 02/20/16 0428  Weight: 86.2 kg (190 lb) 87.3 kg (192 lb 6.4 oz) 87.6 kg (193 lb 3.2 oz)    Examination: General exam: Appears calm and comfortable  Respiratory system: poor air movement, decreased at bases Cardiovascular system: S1 & S2 heard, tcahycardicNo JVD, murmurs, rubs, gallops or clicks. No pedal edema. Gastrointestinal system: Abdomen is nondistended, soft and nontender. Normal bowel sounds heard. Central nervous system: Alert and oriented. No focal neurological deficits. Extremities: Symmetric 5 x 5 power. Skin: No rashes, lesions or ulcers Psychiatry: Judgement and insight appear normal. Mood & affect appropriate.   Data Reviewed: I have personally reviewed following labs and imaging studies  CBC:  Recent Labs Lab 02/17/16 1435 02/18/16 1041 02/19/16 0514 02/20/16 0531  WBC 5.1 4.1 4.6 3.8*  HGB 10.1* 9.3* 8.4* 8.6*  HCT 29.4* 27.7* 25.4* 25.8*  MCV 87.5 87.9 88.8 88.7  PLT 204 159 170 [id number]   Basic Metabolic Panel:  Recent Labs Lab 02/17/16 1435 02/18/16 1041 02/19/16 0514 02/20/16 0531  NA 137 141 139 141  K 4.2 4.1 3.9 3.6  CL 105 109 110 109  CO2 23 21* 20* 23  GLUCOSE 127* 116* 94 100*  BUN 15 15 15 16   CREATININE 1.39* 1.31*  1.27* 1.46*  CALCIUM 9.6 8.7* 8.7* 9.0   GFR: Estimated Creatinine Clearance: 47 mL/min (by C-G formula based on SCr of 1.46 mg/dL (H)). Liver Function Tests: No results for input(s): AST, ALT, ALKPHOS, BILITOT, PROT, ALBUMIN in the last 168 hours. No results for input(s): LIPASE, AMYLASE in the last 168 hours. No results for input(s): AMMONIA in the last 168 hours. Coagulation Profile: No results for input(s): INR, PROTIME in the last 168 hours. Cardiac Enzymes:  Recent Labs Lab 02/18/16 1041  TROPONINI 0.03*  <0.03   BNP (last 3 results) No results for input(s): PROBNP in the last 8760 hours. HbA1C: No results for input(s): HGBA1C in the last 72 hours. CBG: No results for input(s):  GLUCAP in the last 168 hours. Lipid Profile: No results for input(s): CHOL, HDL, LDLCALC, TRIG, CHOLHDL, LDLDIRECT in the last 72 hours. Thyroid Function Tests: No results for input(s): TSH, T4TOTAL, FREET4, T3FREE, THYROIDAB in the last 72 hours. Anemia Panel: No results for input(s): VITAMINB12, FOLATE, FERRITIN, TIBC, IRON, RETICCTPCT in the last 72 hours. Urine analysis:    Component Value Date/Time   LABSPEC 1.010 01/08/2015 1556   PHURINE 5.0 01/08/2015 1556   GLUCOSEU Negative 01/08/2015 1556   HGBUR Negative 01/08/2015 1556   BILIRUBINUR Negative 01/08/2015 1556   KETONESUR Negative 01/08/2015 1556   PROTEINUR 30 02/08/2016 1036   UROBILINOGEN 0.2 01/08/2015 1556   NITRITE Negative 01/08/2015 1556   LEUKOCYTESUR Negative 01/08/2015 1556   Sepsis Labs: @LABRCNTIP (procalcitonin:4,lacticidven:4)  ) Recent Results (from the past 240 hour(s))  MRSA PCR Screening     Status: None   Collection Time: 02/18/16  7:00 AM  Result Value Ref Range Status   MRSA by PCR NEGATIVE NEGATIVE Final    Comment:        The GeneXpert MRSA Assay (FDA approved for NASAL specimens only), is one component of a comprehensive MRSA colonization surveillance program. It is not intended to diagnose MRSA infection nor to guide or monitor treatment for MRSA infections.      Invalid input(s): PROCALCITONIN, [place]   Radiology Studies: No results found.      Scheduled Meds: . enoxaparin (LOVENOX) injection  80 mg Subcutaneous Q12H  . Ferrous Fumarate  1 tablet Oral Q breakfast  . lactose free nutrition  237 mL Oral BID BM  . loratadine  10 mg Oral Daily  . LORazepam  0.5 mg Oral Once  . pantoprazole  40 mg Oral Daily  . sodium chloride flush  3 mL Intravenous Q12H  . sucralfate  1 g Oral TID WC & HS   Continuous Infusions:    LOS: 3 days    Time spent: 35 minutes    Domenic Polite, MD [facility] Pager [phone]  If 7PM-7AM, please contact  night-coverage [email or web address] Password [facility] 02/20/2016, 12:13 PM

## 2016-02-20 NOTE — Progress Notes (Signed)
SATURATION QUALIFICATIONS: (This note is used to comply with regulatory documentation for home oxygen)  Patient Saturations on Room Air at Rest = 98%  Patient Saturations on Room Air while Ambulating = 95%   

## 2016-02-21 ENCOUNTER — Other Ambulatory Visit: Payer: Self-pay

## 2016-02-21 ENCOUNTER — Ambulatory Visit: Payer: Self-pay | Admitting: Oncology

## 2016-02-21 MED ORDER — HEPARIN SOD (PORK) LOCK FLUSH 100 UNIT/ML IV SOLN
500.0000 [IU] | INTRAVENOUS | Status: AC | PRN
  Administered 2016-02-21: 500 [IU]

## 2016-02-21 MED ORDER — ENOXAPARIN SODIUM 80 MG/0.8ML ~~LOC~~ SOLN: 80.0000 mg | Freq: Two times a day (BID) | SUBCUTANEOUS | 1 refills | Status: DC

## 2016-02-21 NOTE — Discharge Instructions (Signed)
Pulmonary Embolism °A pulmonary embolism (PE) is a sudden blockage or decrease of blood flow in one lung or both lungs. Most blockages come from a blood clot that travels from the legs or the pelvis to the lungs. PE is a dangerous and potentially life-threatening condition if it is not treated right away. °What are the causes? °A pulmonary embolism occurs most commonly when a blood clot travels from one of your veins to your lungs. Rarely, PE is caused by air, fat, amniotic fluid, or part of a tumor traveling through your veins to your lungs. °What increases the risk? °A PE is more likely to develop in: °· People who smoke. °· People who are older, especially over 60 years of age. °· People who are overweight (obese). °· People who sit or lie still for a long time, such as during long-distance travel (over 4 hours), bed rest, hospitalization, or during recovery from certain medical conditions like a stroke. °· People who do not engage in much physical activity (sedentary lifestyle). °· People who have chronic breathing disorders. °· People who have a personal or family history of blood clots or blood clotting disease. °· People who have peripheral vascular disease (PVD), diabetes, or some types of cancer. °· People who have heart disease, especially if the person had a recent heart attack or has congestive heart failure. °· People who have neurological diseases that affect the legs (leg paresis). °· People who have had a traumatic injury, such as breaking a hip or leg. °· People who have recently had major or lengthy surgery, especially on the hip, knee, or abdomen. °· People who have had a central line placed inside a large vein. °· People who take medicines that contain the hormone estrogen. These include birth control pills and hormone replacement therapy. °· Pregnancy or during childbirth or the postpartum period. ° °What are the signs or symptoms? °The symptoms of a PE usually start suddenly and  include: °· Shortness of breath while active or at rest. °· Coughing or coughing up blood or blood-tinged mucus. °· Chest pain that is often worse with deep breaths. °· Rapid or irregular heartbeat. °· Feeling light-headed or dizzy. °· Fainting. °· Feeling anxious. °· Sweating. ° °There may also be pain and swelling in a leg if that is where the blood clot started. °These symptoms may represent a serious problem that is an emergency. Do not wait to see if the symptoms will go away. Get medical help right away. Call your local emergency services (911 in the [place] Do not drive yourself to the hospital. °How is this diagnosed? °Your health care provider will take a medical history and perform a physical exam. You may also have other tests, including: °· Blood tests to assess the clotting properties of your blood, assess oxygen levels in your blood, and find blood clots. °· Imaging tests, such as CT, ultrasound, MRI, X-ray, and other tests to see if you have clots anywhere in your body. °· An electrocardiogram (ECG) to look for heart strain from blood clots in the lungs. ° °How is this treated? °The main goals of PE treatment are: °· To stop a blood clot from growing larger. °· To stop new blood clots from forming. ° °The type of treatment that you receive depends on many factors, such as the cause of your PE, your risk for bleeding or developing more clots, and other medical conditions that you have. Sometimes, a combination of treatments is necessary. °This condition may be treated with: °· Medicines, including newer oral blood thinners (  anticoagulants), warfarin, low molecular weight heparins, thrombolytics, or heparins. °· Wearing compression stockings or using different types of devices. °· Surgery (rare) to remove the blood clot or to place a filter in your abdomen to stop the blood clot from traveling to your lungs. ° °Treatments for a PE are often divided into immediate treatment, long-term treatment (up to 3  months after PE), and extended treatment (more than 3 months after PE). Your treatment may continue for several months. This is called maintenance therapy, and it is used to prevent the forming of new blood clots. You can work with your health care provider to choose the treatment program that is best for you. °What are anticoagulants? °Anticoagulants are medicines that treat PEs. They can stop current blood clots from growing and stop new clots from forming. They cannot dissolve existing clots. Your body dissolves clots by itself over time. Anticoagulants are given by mouth, by injection, or through an IV tube. °What are thrombolytics? °Thrombolytics are clot-dissolving medicines that are used to dissolve a PE. They carry a high risk of bleeding, so they tend to be used only in severe cases or if you have very low blood pressure. °Follow these instructions at home: °If you are taking a newer oral anticoagulant: °· Take the medicine every single day at the same time each day. °· Understand what foods and drugs interact with this medicine. °· Understand that there are no regular blood tests required when using this medicine. °· Understand the side effects of this medicine, including excessive bruising or bleeding. Ask your health care provider or pharmacist about other possible side effects. °If you are taking warfarin: °· Understand how to take warfarin and know which foods can affect how warfarin works in your body. °· Understand that it is dangerous to take too much or too little warfarin. Too much warfarin increases the risk of bleeding. Too little warfarin continues to allow the risk for blood clots. °· Follow your PT and INR blood testing schedule. The PT and INR results allow your health care provider to adjust your dose of warfarin. It is very important that you have your PT and INR tested as often as told by your health care provider. °· Avoid major changes in your diet, or tell your health care provider  before you change your diet. Arrange a visit with a registered dietitian to answer your questions. Many foods, especially foods that are high in vitamin K, can interfere with warfarin and affect the PT and INR results. Eat a consistent amount of foods that are high in vitamin K, such as: °? Spinach, kale, broccoli, cabbage, collard greens, turnip greens, Brussels sprouts, peas, cauliflower, seaweed, and parsley. °? Beef liver and pork liver. °? Green tea. °? Soybean oil. °· Tell your health care provider about any and all medicines, vitamins, and supplements that you take, including aspirin and other over-the-counter anti-inflammatory medicines. Be especially cautious with aspirin and anti-inflammatory medicines. Do not take those before you ask your health care provider if it is safe to do so. This is important because many medicines can interfere with warfarin and affect the PT and INR results. °· Do not start or stop taking any over-the-counter or prescription medicine unless your health care provider or pharmacist tells you to do so. °If you take warfarin, you will also need to do these things: °· Hold pressure over cuts for longer than usual. °· Tell your dentist and other health care providers that you are taking warfarin before you have   any procedures in which bleeding may occur. °· Avoid alcohol or drink very small amounts. Tell your health care provider if you change your alcohol intake. °· Do not use tobacco products, including cigarettes, chewing tobacco, and e-cigarettes. If you need help quitting, ask your health care provider. °· Avoid contact sports. ° °General instructions °· Take over-the-counter and prescription medicines only as told by your health care provider. Anticoagulant medicines can have side effects, including easy bruising and difficulty stopping bleeding. If you are prescribed an anticoagulant, you will also need to do these things: °? Hold pressure over cuts for longer than  usual. °? Tell your dentist and other health care providers that you are taking anticoagulants before you have any procedures in which bleeding may occur. °? Avoid contact sports. °· Wear a medical alert bracelet or carry a medical alert card that says you have had a PE. °· Ask your health care provider how soon you can go back to your normal activities. Stay active to prevent new blood clots from forming. °· Make sure to exercise while traveling or when you have been sitting or standing for a long period of time. It is very important to exercise. Exercise your legs by walking or by tightening and relaxing your leg muscles often. Take frequent walks. °· Wear compression stockings as told by your health care provider to help prevent more blood clots from forming. °· Do not use tobacco products, including cigarettes, chewing tobacco, and e-cigarettes. If you need help quitting, ask your health care provider. °· Keep all follow-up appointments with your health care provider. This is important. °How is this prevented? °Take these actions to decrease your risk of developing another PE: °· Exercise regularly. For at least 30 minutes every day, engage in: °? Activity that involves moving your arms and legs. °? Activity that encourages good blood flow through your body by increasing your heart rate. °· Exercise your arms and legs every hour during long-distance travel (over 4 hours). Drink plenty of water and avoid drinking alcohol while traveling. °· Avoid sitting or lying in bed for long periods of time without moving your legs. °· Maintain a weight that is appropriate for your height. Ask your health care provider what weight is healthy for you. °· If you are a woman who is over 35 years of age, avoid unnecessary use of medicines that contain estrogen. These include birth control pills. °· Do not smoke, especially if you take estrogen medicines. If you need help quitting, ask your health care provider. °· If you are at  very high risk for PE, wear compression stockings. °· If you recently had a PE, have regularly scheduled ultrasound testing on your legs to check for new blood clots. ° °If you are hospitalized, prevention measures may include: °· Early walking after surgery, as soon as your health care provider says that it is safe. °· Receiving anticoagulants to prevent blood clots. If you cannot take anticoagulants, other options may be available, such as wearing compression stockings or using different types of devices. ° °Get help right away if: °· You have new or increased pain, swelling, or redness in an arm or leg. °· You have numbness or tingling in an arm or leg. °· You have shortness of breath while active or at rest. °· You have chest pain. °· You have a rapid or irregular heartbeat. °· You feel light-headed or dizzy. °· You cough up blood. °· You notice blood in your vomit, bowel movement, or   urine. °· You have a fever. °These symptoms may represent a serious problem that is an emergency. Do not wait to see if the symptoms will go away. Get medical help right away. Call your local emergency services (911 in the [place] Do not drive yourself to the hospital. °This information is not intended to replace advice given to you by your health care provider. Make sure you discuss any questions you have with your health care provider. °Document Released: 01/11/2000 Document Revised: 06/21/2015 Document Reviewed: 05/10/2014 °Elsevier Interactive Patient Education © 2017 [employer] ° °

## 2016-02-22 ENCOUNTER — Other Ambulatory Visit: Payer: Self-pay

## 2016-02-22 ENCOUNTER — Telehealth: Payer: Self-pay

## 2016-02-22 ENCOUNTER — Ambulatory Visit: Payer: Self-pay

## 2016-02-22 NOTE — Telephone Encounter (Signed)
Able to do more, having less trouble breathing. Can now get from one end of upstairs to other. Has not gone down stairs yet. Instructed that going upstairs is harder than down, have her husband with her. Instructed her to take frequent breaks and rest periods. She says she is.   She does have a dry hacky cough, instructed her to watch for congestion, fever, colored sputum. She has given herself 2 lovenox shots so far with no issues.  She verified appt 1/29 with Dr Marko Plume.

## 2016-02-22 NOTE — Telephone Encounter (Signed)
Short term disability papers from [employer] received and placed in managed care.

## 2016-02-22 NOTE — Discharge Summary (Signed)
Physician Discharge Summary  SAVVY WHICKER [id number] DOB: 11-Oct-1960 DOA: 02/17/2016  PCP: Gar Ponto, MD  Admit date: 02/17/2016 Discharge date: 02/21/2016  Time spent: 35 minutes  Recommendations for Outpatient Follow-up:  1. Dr.Livesay 1/29 2. PCP Dr.Daniel in 1-2weeks    Discharge Diagnoses:  Principal Problem:   Acute saddle pulmonary embolism with acute cor pulmonale (HCC) Active Problems:   Ovarian cancer, bilateral (HCC)   Environmental allergies   HTN (hypertension), benign   Anemia due to antineoplastic chemotherapy   CKD (chronic kidney disease) stage 3, GFR 30-59 ml/min   Discharge Condition: stable  Diet recommendation: heart healthy  Filed Weights   02/18/16 0702 02/20/16 0428 02/21/16 0420  Weight: 87.3 kg (192 lb 6.4 oz) 87.6 kg (193 lb 3.2 oz) 87.7 kg (193 lb 4.8 oz)    History of present illness:  Audrey Eich[place] Mead[place]sis a 56 y.o.femalewith a past medical history significant for metastatic ovarian CAwho admitted with Extensive PE with RV  Strain and R heart dilation.  Hospital Course:  Massive Bilateral PE and acute DVT -CT scan showed massive B/L PE, ECHO with severely dilated Rv and moderately decreased RV fucntion -Seen by PCCM this admission, no thrombolytics recommended -she was started on IV heparin and then once stabilized transitioned to SQ lovenox-due to concomittant malignancy - lovenox teaching completed -hemodynamics improved, weaned off O2, ambulating more at discharge -advised quick FU with Dr.Livesay  Ovarian Ca: -followed by Dr. Marko Plume. Currently on Doxil and bevacizumab  History of hypertension: -Hyzaar on hold, consider resuming at FU depending on BP trend  Anemia due to chemo, cancer -stable  CKD III : Baseline Cr 1.1-1.4, stable.  Consultations:  Onc Dr.Livesay  PCCM  Discharge Exam: Vitals:   02/21/16 0420 02/21/16 0759  BP: (!) 147/90 (!) 142/89  Pulse: 87 88  Resp: (!) 25 (!) 24  Temp: 99.1  F (37.3 C) 98.1 F (36.7 C)    General: AAOx3 Cardiovascular: S1S2/RRR Respiratory: CTAB  Discharge Instructions   Discharge Instructions    Diet - low sodium heart healthy    Complete by:  As directed    Increase activity slowly    Complete by:  As directed      Discharge Medication List as of 02/21/2016 10:57 AM    START taking these medications   Details  enoxaparin (LOVENOX) 80 MG/0.8ML injection Inject 0.8 mLs (80 mg total) into the skin every 12 (twelve) hours., Starting Thu 02/21/2016, Print      CONTINUE these medications which have NOT CHANGED   Details  albuterol (PROVENTIL HFA;VENTOLIN HFA) 108 (90 Base) MCG/ACT inhaler Inhale 2 puffs into the lungs 2 (two) times daily as needed for wheezing or shortness of breath., Starting Fri 01/25/2016, Normal    cetirizine (ZYRTEC) 5 MG tablet Take 5 mg by mouth daily., Until Discontinued, Historical Med    fluticasone (FLONASE) 50 MCG/ACT nasal spray Place 1 spray into both nostrils daily as needed., Starting Fri 12/21/2015, Normal    lidocaine-prilocaine (EMLA) cream Apply 1-2 hours prior to [facility] access as directed., Normal    omeprazole (PRILOSEC) 20 MG capsule Take 20 mg by mouth at bedtime., Historical Med    polyethylene glycol (MIRALAX / GLYCOLAX) packet Take 17 g by mouth daily. , Historical Med    LORazepam (ATIVAN) 0.5 MG tablet Place 1 tablet under the tongue or swallow every 6 hrs as needed for nausea. Will make drowsy., Phone In    ondansetron (ZOFRAN) 8 MG tablet Take 1 tablet (8  mg total) by mouth every 8 (eight) hours as needed for nausea (Will not make drowsy)., Starting Mon 12/03/2015, Normal      STOP taking these medications     losartan-hydrochlorothiazide (HYZAAR) 100-25 MG per tablet      benzonatate (TESSALON) 100 MG capsule      Ferrous Fumarate (HEMOCYTE - 106 MG FE) 324 (106 Fe) MG TABS tablet      ibuprofen (ADVIL,MOTRIN) 200 MG tablet        Allergies  Allergen Reactions  .  Sulfa Antibiotics Hives   Follow-up Information    Gar Ponto, MD. Schedule an appointment as soon as possible for a visit on 02/27/2016.   Specialty:  Family Medicine Why:  Follow up @ 12 pm Contact information: [place] [address] [phone]        Evlyn Clines, MD Follow up in 1 week(s).   Specialty:  Oncology Contact information: [address] [place] [address] [phone]            The results of significant diagnostics from this hospitalization (including imaging, microbiology, ancillary and laboratory) are listed below for reference.    Significant Diagnostic Studies: Dg Chest 2 View  Result Date: 02/17/2016 CLINICAL DATA:  Insert Insert EXAM: CHEST  2 VIEW COMPARISON:  None. FINDINGS: LEFT-sided port. Normal cardiac silhouette. No pulmonary edema. Lateral projection there LEFT lower lobe density. IMPRESSION: LEFT lower lobe atelectasis, effusion, or infiltrate. Electronically Signed   By: Suzy Bouchard M.D.   On: 02/17/2016 16:05   Ct Head Wo Contrast  Result Date: 02/17/2016 CLINICAL DATA:  Dizziness x 2 days. Coughing for about couple months. Sob x 2 days. Pt denies chest pain. Pt denies headache. EXAM: CT HEAD WITHOUT CONTRAST TECHNIQUE: Contiguous axial images were obtained from the base of the skull through the vertex without intravenous contrast. COMPARISON:  07/02/2014 FINDINGS: Brain: No acute intracranial hemorrhage. No focal mass lesion. No CT evidence of acute infarction. No midline shift or mass effect. No hydrocephalus. Basilar cisterns are patent. Vascular: No hyperdense vessel or unexpected calcification. Skull: Normal. Negative for fracture or focal lesion. Sinuses/Orbits: Paranasal sinuses and mastoid air cells are clear. Orbits are clear. Other: None. IMPRESSION: No acute intracranial findings. Electronically Signed   By: Suzy Bouchard M.D.   On: 02/17/2016 22:35   Ct Angio Chest Pe W And/or Wo Contrast  Result Date:  02/17/2016 CLINICAL DATA:  Short of breath.  Tachycardia. EXAM: CT ANGIOGRAPHY CHEST WITH CONTRAST TECHNIQUE: Multidetector CT imaging of the chest was performed using the standard protocol during bolus administration of intravenous contrast. Multiplanar CT image reconstructions and MIPs were obtained to evaluate the vascular anatomy. CONTRAST:  100 mL Isovue COMPARISON:  None. FINDINGS: Cardiovascular: Tubular filling defects span the LEFT RIGHT main pulmonary artery (so-called saddle pulmonary emboli. Large destructive pulmonary emboli extend into the proximal LEFT and RIGHT lower lobe pulmonary arteries. Emboli extend into the LEFT and RIGHT upper lobe pulmonary arteries. Evidence of RIGHT ventricular strain with the RIGHT ventricular to LEFT ventricular diameter ratio equal 1.46. Mediastinum/Nodes: No axillary or supraclavicular adenopathy. No mediastinal hilar adenopathy. Lungs/Pleura: There is a large layering LEFT pleural effusion. Mild LEFT basilar atelectasis. Upper Abdomen: New mass in the gastrohepatic ligament measuring 6.2 x 6.3 cm (image 120, series 4 subcapsular lesion adjacent RIGHT hepatic lobe on image 125, series 4 similar to comparison CT Musculoskeletal: No aggressive osseous lesion. Review of the MIP images confirms the above findings. IMPRESSION: 1. Massive bilateral occlusive  pulmonary emboli. CT evidence of right heart strain (RV/LV Ratio = 1.46) consistent with at least submassive (intermediate risk) PE. The presence of right heart strain has been associated with an increased risk of morbidity and mortality. Please activate Code PE by paging [phone]. 2. Large layering RIGHT pleural effusion. Concern for malignant effusion. 3. New large mass in the gastrohepatic ligament (6 cm) concerning for progression of ovarian carcinoma. Critical Value/emergent results were called by telephone at the time of interpretation on 02/17/2016 at 10:45 pm to Dr. Avie Echevaria , who verbally acknowledged  these results. Electronically Signed   By: Suzy Bouchard M.D.   On: 02/17/2016 22:47    Microbiology: Recent Results (from the past 240 hour(s))  MRSA PCR Screening     Status: None   Collection Time: 02/18/16  7:00 AM  Result Value Ref Range Status   MRSA by PCR NEGATIVE NEGATIVE Final    Comment:        The GeneXpert MRSA Assay (FDA approved for NASAL specimens only), is one component of a comprehensive MRSA colonization surveillance program. It is not intended to diagnose MRSA infection nor to guide or monitor treatment for MRSA infections.      Labs: Basic Metabolic Panel:  Recent Labs Lab 02/17/16 1435 02/18/16 1041 02/19/16 0514 02/20/16 0531  NA 137 141 139 141  K 4.2 4.1 3.9 3.6  CL 105 109 110 109  CO2 23 21* 20* 23  GLUCOSE 127* 116* 94 100*  BUN 15 15 15 16   CREATININE 1.39* 1.31* 1.27* 1.46*  CALCIUM 9.6 8.7* 8.7* 9.0   Liver Function Tests: No results for input(s): AST, ALT, ALKPHOS, BILITOT, PROT, ALBUMIN in the last 168 hours. No results for input(s): LIPASE, AMYLASE in the last 168 hours. No results for input(s): AMMONIA in the last 168 hours. CBC:  Recent Labs Lab 02/17/16 1435 02/18/16 1041 02/19/16 0514 02/20/16 0531  WBC 5.1 4.1 4.6 3.8*  HGB 10.1* 9.3* 8.4* 8.6*  HCT 29.4* 27.7* 25.4* 25.8*  MCV 87.5 87.9 88.8 88.7  PLT 204 159 170 173   Cardiac Enzymes:  Recent Labs Lab 02/18/16 1041  TROPONINI 0.03*  <0.03   BNP: BNP (last 3 results)  Recent Labs  02/17/16 1435  BNP 168.3*    ProBNP (last 3 results) No results for input(s): PROBNP in the last 8760 hours.  CBG: No results for input(s): GLUCAP in the last 168 hours.     SignedDomenic Polite MD.  [facility] 02/22/2016, 2:42 PM

## 2016-02-24 ENCOUNTER — Other Ambulatory Visit: Payer: Self-pay | Admitting: Oncology

## 2016-02-25 ENCOUNTER — Ambulatory Visit (HOSPITAL_BASED_OUTPATIENT_CLINIC_OR_DEPARTMENT_OTHER): Payer: Managed Care, Other (non HMO)

## 2016-02-25 ENCOUNTER — Ambulatory Visit: Payer: Self-pay | Admitting: Oncology

## 2016-02-25 ENCOUNTER — Ambulatory Visit (HOSPITAL_BASED_OUTPATIENT_CLINIC_OR_DEPARTMENT_OTHER): Payer: Managed Care, Other (non HMO) | Admitting: Oncology

## 2016-02-25 ENCOUNTER — Other Ambulatory Visit (HOSPITAL_BASED_OUTPATIENT_CLINIC_OR_DEPARTMENT_OTHER): Payer: Managed Care, Other (non HMO)

## 2016-02-25 ENCOUNTER — Other Ambulatory Visit: Payer: Self-pay

## 2016-02-25 VITALS — BP 157/76 | HR 85 | Temp 97.5°F | Resp 18 | Ht 65.0 in | Wt 191.7 lb

## 2016-02-25 DIAGNOSIS — I82412 Acute embolism and thrombosis of left femoral vein: Secondary | ICD-10-CM

## 2016-02-25 DIAGNOSIS — E639 Nutritional deficiency, unspecified: Secondary | ICD-10-CM

## 2016-02-25 DIAGNOSIS — I2602 Saddle embolus of pulmonary artery with acute cor pulmonale: Secondary | ICD-10-CM | POA: Diagnosis not present

## 2016-02-25 DIAGNOSIS — N183 Chronic kidney disease, stage 3 unspecified: Secondary | ICD-10-CM

## 2016-02-25 DIAGNOSIS — C569 Malignant neoplasm of unspecified ovary: Secondary | ICD-10-CM

## 2016-02-25 DIAGNOSIS — Z95828 Presence of other vascular implants and grafts: Secondary | ICD-10-CM

## 2016-02-25 DIAGNOSIS — C563 Malignant neoplasm of bilateral ovaries: Secondary | ICD-10-CM

## 2016-02-25 DIAGNOSIS — Z7901 Long term (current) use of anticoagulants: Secondary | ICD-10-CM

## 2016-02-25 DIAGNOSIS — C561 Malignant neoplasm of right ovary: Secondary | ICD-10-CM

## 2016-02-25 DIAGNOSIS — R053 Chronic cough: Secondary | ICD-10-CM

## 2016-02-25 DIAGNOSIS — R05 Cough: Secondary | ICD-10-CM

## 2016-02-25 DIAGNOSIS — C562 Malignant neoplasm of left ovary: Secondary | ICD-10-CM

## 2016-02-25 LAB — CBC WITH DIFFERENTIAL/PLATELET
BASO%: 0.9 % (ref 0.0–2.0)
Basophils Absolute: 0 10*3/uL (ref 0.0–0.1)
EOS%: 0.3 % (ref 0.0–7.0)
Eosinophils Absolute: 0 10*3/uL (ref 0.0–0.5)
HCT: 27.4 % — ABNORMAL LOW (ref 34.8–46.6)
HGB: 9.1 g/dL — ABNORMAL LOW (ref 11.6–15.9)
LYMPH%: 30.4 % (ref 14.0–49.7)
MCH: 30.4 pg (ref 25.1–34.0)
MCHC: 33.3 g/dL (ref 31.5–36.0)
MCV: 91.2 fL (ref 79.5–101.0)
MONO#: 0.8 10*3/uL (ref 0.1–0.9)
MONO%: 15.1 % — ABNORMAL HIGH (ref 0.0–14.0)
NEUT#: 3 10*3/uL (ref 1.5–6.5)
NEUT%: 53.3 % (ref 38.4–76.8)
Platelets: 171 10*3/uL (ref 145–400)
RBC: 3.01 10*6/uL — AB (ref 3.70–5.45)
RDW: 22.7 % — ABNORMAL HIGH (ref 11.2–14.5)
WBC: 5.6 10*3/uL (ref 3.9–10.3)
lymph#: 1.7 10*3/uL (ref 0.9–3.3)

## 2016-02-25 LAB — COMPREHENSIVE METABOLIC PANEL
ALT: 14 U/L (ref 0–55)
AST: 15 U/L (ref 5–34)
Albumin: 3.4 g/dL — ABNORMAL LOW (ref 3.5–5.0)
Alkaline Phosphatase: 59 U/L (ref 40–150)
Anion Gap: 11 mEq/L (ref 3–11)
BUN: 10.4 mg/dL (ref 7.0–26.0)
CHLORIDE: 106 meq/L (ref 98–109)
CO2: 22 meq/L (ref 22–29)
CREATININE: 1.1 mg/dL (ref 0.6–1.1)
Calcium: 9.6 mg/dL (ref 8.4–10.4)
EGFR: 66 mL/min/{1.73_m2} — ABNORMAL LOW (ref >90)
GLUCOSE: 100 mg/dL (ref 70–140)
Potassium: 3.8 mEq/L (ref 3.5–5.1)
SODIUM: 139 meq/L (ref 136–145)
Total Bilirubin: 0.7 mg/dL (ref 0.20–1.20)
Total Protein: 7.4 g/dL (ref 6.4–8.3)

## 2016-02-25 MED ORDER — HYDROCODONE-HOMATROPINE 5-1.5 MG/5ML PO SYRP: 5.0000 mL | ORAL_SOLUTION | Freq: Four times a day (QID) | ORAL | 0 refills | Status: DC | PRN

## 2016-02-25 MED ORDER — HEPARIN SOD (PORK) LOCK FLUSH 100 UNIT/ML IV SOLN
500.0000 [IU] | Freq: Once | INTRAVENOUS | Status: AC | PRN
  Administered 2016-02-25: 500 [IU] via INTRAVENOUS
  Filled 2016-02-25: qty 5

## 2016-02-25 MED ORDER — SODIUM CHLORIDE 0.9% FLUSH
10.0000 mL | INTRAVENOUS | Status: DC | PRN
  Administered 2016-02-25: 10 mL via INTRAVENOUS
  Filled 2016-02-25: qty 10

## 2016-02-26 ENCOUNTER — Encounter: Payer: Self-pay | Admitting: Oncology

## 2016-02-26 DIAGNOSIS — I82412 Acute embolism and thrombosis of left femoral vein: Secondary | ICD-10-CM | POA: Insufficient documentation

## 2016-02-26 DIAGNOSIS — E639 Nutritional deficiency, unspecified: Secondary | ICD-10-CM | POA: Insufficient documentation

## 2016-02-26 LAB — CA 125: Cancer Antigen (CA) 125: 89.5 U/mL — ABNORMAL HIGH (ref 0.0–38.1)

## 2016-02-26 MED ORDER — ENOXAPARIN SODIUM 80 MG/0.8ML ~~LOC~~ SOLN: 80.0000 mg | Freq: Two times a day (BID) | SUBCUTANEOUS | 0 refills | Status: DC

## 2016-02-26 NOTE — Progress Notes (Signed)
OFFICE PROGRESS NOTE   February 26, 2016   Physicians: Barron Alvine, Mitzie Na, MD (PCP), Alden Hipp), Gari [facility])  INTERVAL HISTORY:   Patient is seen, together with husband, in continuing attention to recurrent ovarian cancer recently started on doxil + avastin, and hospitalization last week with acute saddle PE with cor pulmonale and LLE DVT.  The acute PE/ DVT was likely related to active malignancy and/ or avastin.  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 Disease progressed after 2 cycles of carbo taxol thru 12-28-15.Patient had first doxil with avastin 01-25-16, then q 2 week avastin again on 02-08-16. She had very persistent cough x weeks, then developed much worse SOB on 02-17-16. Evaluation in ED 02-17-16 found saddle pulmonary embolus with acute right heart strain; echocardiogram confirmed acute cor pulmonale and LE venous dopplers found asymptomatic DVT LLE. She was anticoagulated initially with heparin, PCCM consulted, converted to bid lovenox. She was weaned off of O2 prior to DC home on 02-21-16. CT head also done in ED due to brief LOC on arrival. She continues bid lovenox. Planned cycle 2 doxil and avastin was held 02-21-16.  Patient has gradually improved exercise tolerance since recent hospital DC, now walking in house without SOB. She is giving the lovenox injections herself, no swelling or discomfort LLE or RLE, no chest pain, only bleeding has been slight from nose once. Less rhinorrhea back on zyrtec, does not feel Flonase is helpful and will stop that The persistent cough is still very bothersome and severe at times, not productive. No wheezing.  No fever, no symptoms of infection. Appetite is a little better, able to drink ~ 1 supplement daily, no nausea or vomiting, taste ok, no mucositis. Bowels are moving. Only abdominal discomfort is across  upper abdomen when coughing, not complaining now of epigastric fullness with po intake. Notices some GERD. No problems with PAC. Bladder ok. Not sleeping well due to cough. Remainder of 14 point Review of Systems negative.    PAC placed by IR 12-04-15 Flu vaccine 11-14-15 CA 125 preoperatively 56, down to 5 on 09-14-14. Baseline for chemo for recurrence 11-30-15 32.4, increased to 74.6 after 2 cycles carbo taxol. 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 Urine culture showed no infection, then CT AP at [facility] 03-16-14 found perihepatic ascites and pelvic mass 9.9 x 20.1 x 17.8 cm which was cystic and solid, kidneys and ureters unremarkable, no pelvic fluid, rectosigmoid partially compressed, other organs not remarkable, no mention of adenopathy. She was seen by Dr Evie Lacks of gyn,  CT findings confirmed by transvaginal US. CA 125 was 56 pre op. She saw Dr Alycia Rossetti in consultation 03-23-14, then surgery at [facility]  03-31-14 TAH/BSO, omentectomy, pelvic and paraaortic node evaluation. Surgical findings were of bilateral complex ovarian masses and dense adhesions, with intra-operative spill of right ovarian mass which was densely adherent to sigmoid serosa; posterior uterine segment also adherent to rectosigmoid, and retroperitoneal fibrosis bilaterally. Pathology [facility] [phone])  grade 3 clear cell carcinoma involving both ovaries, largest 13 x 9.5 x 6.2 cm, + LVSI and one right pelvic node with microscopic involvement approximately 1 mm, ER PR negative, pT3a1 pN1 pMx. Cytology from ascites [facility] [phone])  positive for adenocarcinoma of gyn primary. [facility] multidisciplinary conference 04-19-14 recommended carboplatin and  taxane chemotherapy. Cycle 1 carbo taxol was given on 05-12-14, with neutropenia by day 11 (ANC 0.8), neulasta as on pro more  helpful than granix.She was hospitalized at [facility] after cycle 3 with syncope related to dehydration. She completed cycle 6 on 09-01-14. PET 08-2014 no clear residual or progressive disease; colonoscopy was unremarkable. She had peripheral neuropathy with taxol. CA 125 was 7.5 in 06-2015, then up to 18.4 by 10-2015. CT CAP 11-19-15 showed new progression subcapsular areas of liver, at proximal sigmoid colon and in mesentery, with small ascites. She began chemotherapy for recurrence with carbo taxol on 12-06-15, with on pro neulasta. She had 2 units PRBCs for hemoglobin down to 7.5  o11-24-17. She had increasing abdominal discomfort and increase in CA 125 marker from 32 as baseline for retreatment carbo taxol up to 53 in late Nov and 74 after cycle 2 chemo, which was given 12-28-15. CT AP 01-16-16 showed progression around liver, with lesser increase in region of sigmoid colon (platinum refractory disease). Treatment changed to doxil with avastin 01-25-16, with avastin again on 02-08-16. She had saddle PE with LLE DVT on 02-17-16.  Objective:  Vital signs in last 24 hours:  BP (!) 157/76 (BP Location: Left Arm, Patient Position: Sitting)   Pulse 85   Temp 97.5 F (36.4 C) (Oral)   Resp 18   Ht '5\' 5"'  (1.651 m)   Wt 191 lb 11.2 oz (87 kg)   SpO2 98%   BMI 31.90 kg/m  Weight down 5 lbs from 02-04-16. Looks much more comfortable than when I saw her in hospital. Alert, oriented and appropriate, no SOB talking, does not appear in any acute discomfort. Very pleasant as always. Ambulatory without assistance, easily able to get on and off exam table.Marland Kitchen Respirations not labored on RA, occasional episodes of coughing sounds raspy but not productive Alopecia  HEENT:PERRL, sclerae not icteric. Oral mucosa moist without lesions, posterior pharynx dull erythema without exudate. Mucous membranes somewhat pale. Neck supple. No JVD.  Lymphatics:no supraclavicular or inguinal adenopathy Resp: clear to auscultation  bilaterally and normal percussion bilaterally Cardio: regular rate and rhythm. No gallop. GI: abdomen soft, nontender, somewhat full across upper abdomen and epigastrium as previously, not distended lower quadrants. Normally active bowel sounds. Surgical incision not remarkable. Musculoskeletal/ Extremities: without pitting edema, cords, tenderness Neuro: no change peripheral neuropathy feet/ fingers. Otherwise nonfocal Skin without rash, petechiae. Minimal brusiing at sites of lovenox injections bilateral lower abdomen Portacath-without erythema or tenderness  Lab Results:  Results for orders placed or performed in visit on 02/25/16  CBC with Differential  Result Value Ref Range   WBC 5.6 3.9 - 10.3 10e3/uL   NEUT# 3.0 1.5 - 6.5 10e3/uL   HGB 9.1 (L) 11.6 - 15.9 g/dL   HCT 27.4 (L) 34.8 - 46.6 %   Platelets 171 145 - 400 10e3/uL   MCV 91.2 79.5 - 101.0 fL   MCH 30.4 25.1 - 34.0 pg   MCHC 33.3 31.5 - 36.0 g/dL   RBC 3.01 (L) 3.70 - 5.45 10e6/uL   RDW 22.7 (H) 11.2 - 14.5 %   lymph# 1.7 0.9 - 3.3 10e3/uL   MONO# 0.8 0.1 - 0.9 10e3/uL   Eosinophils Absolute 0.0 0.0 - 0.5 10e3/uL   Basophils Absolute 0.0 0.0 - 0.1 10e3/uL   NEUT% 53.3 38.4 - 76.8 %   LYMPH% 30.4 14.0 - 49.7 %   MONO% 15.1 (H) 0.0 - 14.0 %   EOS% 0.3 0.0 - 7.0 %   BASO%  0.9 0.0 - 2.0 %  Comprehensive metabolic panel  Result Value Ref Range   Sodium 139 136 - 145 mEq/L   Potassium 3.8 3.5 - 5.1 mEq/L   Chloride 106 98 - 109 mEq/L   CO2 22 22 - 29 mEq/L   Glucose 100 70 - 140 mg/dl   BUN 10.4 7.0 - 26.0 mg/dL   Creatinine 1.1 0.6 - 1.1 mg/dL   Total Bilirubin 0.70 0.20 - 1.20 mg/dL   Alkaline Phosphatase 59 40 - 150 U/L   AST 15 5 - 34 U/L   ALT 14 0 - 55 U/L   Total Protein 7.4 6.4 - 8.3 g/dL   Albumin 3.4 (L) 3.5 - 5.0 g/dL   Calcium 9.6 8.4 - 10.4 mg/dL   Anion Gap 11 3 - 11 mEq/L   EGFR 66 (L) >90 ml/min/1.73 m2  CA 125  Result Value Ref Range   Cancer Antigen (CA) 125 89.5 (H) 0.0 - 38.1 U/mL   CA  125 available after visit, compares with 74.6 on 01-14-16 and 48 on 12-21-15.   Studies/Results: CT ANGIOGRAPHY CHEST WITH CONTRAST  02-17-16  TECHNIQUE: Multidetector CT imaging of the chest was performed using the standard protocol during bolus administration of intravenous contrast. Multiplanar CT image reconstructions and MIPs were obtained to evaluate the vascular anatomy.  CONTRAST:  100 mL Isovue  COMPARISON:  None.       FINDINGS: Cardiovascular: Tubular filling defects span the LEFT RIGHT main pulmonary artery (so-called saddle pulmonary emboli. Large destructive pulmonary emboli extend into the proximal LEFT and RIGHT lower lobe pulmonary arteries. Emboli extend into the LEFT and RIGHT upper lobe pulmonary arteries.  Evidence of RIGHT ventricular strain with the RIGHT ventricular to LEFT ventricular diameter ratio equal 1.46.  Mediastinum/Nodes: No axillary or supraclavicular adenopathy. No mediastinal hilar adenopathy.  Lungs/Pleura: There is a large layering LEFT pleural effusion. Mild LEFT basilar atelectasis.  Upper Abdomen: New mass in the gastrohepatic ligament measuring 6.2 x 6.3 cm (image 120, series 4 subcapsular lesion adjacent RIGHT hepatic lobe on image 125, series 4 similar to comparison CT  Musculoskeletal: No aggressive osseous lesion.  Review of the MIP images confirms the above findings.  IMPRESSION: 1. Massive bilateral occlusive pulmonary emboli. CT evidence of right heart strain (RV/LV Ratio = 1.46) consistent with at least submassive (intermediate risk) PE. The presence of right heart strain has been associated with an increased risk of morbidity and mortality. Please activate Code PE by paging [phone]. 2. Large layering RIGHT pleural effusion. Concern for malignant effusion. 3. New large mass in the gastrohepatic ligament (6 cm) concerning for progression of ovarian carcinoma.     Bilateral LE venous dopplers  02-18-16: Summary: Findings consistent with acute deep vein thrombosis involving the popliteal and distal femoral veins of the left lower extremity.     Echocardiogram 02-18-16  - Normal left ventricular size and systolic function.   Severely dilated right ventricle with moderately decreased   systolic function and severe pulmonary hypertension suspicious   for pulmonary embolism.  Left pleural effusion, no pericardial effusion, EF 55-60%    EXAM: CT HEAD WITHOUT CONTRAST 02-17-16  TECHNIQUE: Contiguous axial images were obtained from the base of the skull through the vertex without intravenous contrast.  COMPARISON:  07/02/2014  FINDINGS: Brain: No acute intracranial hemorrhage. No focal mass lesion. No CT evidence of acute infarction. No midline shift or mass effect. No hydrocephalus. Basilar cisterns are patent.  Vascular: No hyperdense vessel or unexpected calcification.  Skull:  Normal. Negative for fracture or focal lesion.  Sinuses/Orbits: Paranasal sinuses and mastoid air cells are clear. Orbits are clear.  Other: None.  IMPRESSION: No acute intracranial findings.     Medications: I have reviewed the patient's current medications. Continue lovenox 80 mg bid: dose calculated again, correct including EGFR 45 (no dose change for cr cl >=30).  DISCUSSION Reviewed information around PE/ DVT. She is clinically improving from this major complication. I have explained that bid lovenox may be more beneficial for now than immediately changing to once daily dosing, particularly as she was on heparin infusion only for ~3 days in hospital. They understand that hypercoagulable state is likely from active malignancy and/ or avastin.  Recommend giving her another 1-2 weeks to improve from this acute event prior to next chemo with doxil only.  Patient and husband ask about second opinion consultation, which is certainly reasonable given unexpectedly poor  responsiveness of this recurrent ovarian cancer, and possible avastin complication. After discussion of options, and as Dr Alycia Rossetti is already entirely familiar with her case, she is glad to see Dr Alycia Rossetti at [facility] earlier than planned at [facility]; Dr Alycia Rossetti has kindly worked patient in for her clinic at [facility] 03-03-16.  She is to see Dr Quillian Quince on 02-27-16. With cough ongoing for at least 2 months, I have also offered pulmonary consult, which she would like. Until she can be seen by pulmonary, she will use OTC prilosec and pepcid AM and PM, no mints or menthol cough drops, sugar free candy, avoid clearing throat, continue zyrtec. Appointment made with [facility] 03-26-16 (Dr Melvyn Novas)    Assessment/Plan:  1.Recurrent ovarian carcinoma: identified 14 months after completion of adjuvant therapy for IIIA1 high grade clear cell carcinoma of bilateral ovaries. Despite timing out from carboplatin and despite RAD51 mutation, she progressed with 2 cycles of carbo taxol 11-9 thru 12-28-15. Doxil + avastin begun 01-25-16, with avastin also 02-08-16. CTA chest 02-17-16 shows new mass in gastrohepatic ligament (? if comparison was CT AP 01-16-16 or CT chest 11-19-15). Held cycle 2 doxil 1-25 due to PEs; expect need to DC avastin. Appreciate Dr Alycia Rossetti seeing her at [facility] 03-03-16. Patient will see Dr Alvy Bimler on 03-07-16, infusion time requested for doxil that day, as long as no change in treatment plan. 2.massive saddle PE and LLE DVT 02-17-16, with acute cor pulmonale. Hospitalized thru 02-21-16, on heparin ~ 3 days. Continue bid lovenox 1 mg/kg. Hypercoagulable likely from active malignancy and/ or avastin. 3.cough and clear rhinorrhea x weeks, possibly allergic or GERD. Has not responded to anything that we have tried. Plan as above until pulmonary consult 03-26-16. 4.PAC in , functioning well. Very difficult peripheral IV access 5.flu vaccine 11-14-15 6.CKD 3, creatinine higher when she uses diuretic 7.chemo and iron deficiency  anemia: has needed PRBCs with chemo, had feraheme 12-2015 and 02-08-16. 8.weight loss, nutrition likely not optimal: encouraged her to try clear liquid supplements at least 2 daily. She has been seen by [facility] nutritionist.   All questions answered and they know to call if needed prior to next appointment. Cc Dr Alycia Rossetti, Dr Quillian Quince. Time spent 35 min including >50% counseling and coordination of care.   Evlyn Clines, MD

## 2016-02-28 ENCOUNTER — Telehealth: Payer: Self-pay | Admitting: Oncology

## 2016-02-28 NOTE — Telephone Encounter (Signed)
faxed Disability paperwork to [employer] at fax number [phone]

## 2016-03-04 ENCOUNTER — Telehealth: Payer: Self-pay | Admitting: Gynecologic Oncology

## 2016-03-04 NOTE — Telephone Encounter (Signed)
I reviewed the patient's spiral CT scan from January the commented on a new gastrohepatic ligament metastasis. When I viewed the films myself and compared to her CT scan of the abdomen and pelvis from December to. That there was a similar-sized mass in that location. I did review her CT images with radiology today. They also agree that there was a mass present previously and that the 2 images had not been compared.   Therefore, as the mass is not new and it appears to be of similar size I would recommend that she continue with the liposomal doxorubicin chemotherapy. She has only had one cycle and we would not expect to see a significant improvement with only one cycle. She is following up with Dr. Alvy Bimler on Friday.  She is comfortable with this decision and will keep her appointments on Friday. She has given me permission to call her daughter to discuss and I will do so. Audrey Porter

## 2016-03-05 ENCOUNTER — Telehealth: Payer: Self-pay

## 2016-03-05 ENCOUNTER — Telehealth: Payer: Self-pay | Admitting: Hematology and Oncology

## 2016-03-05 NOTE — Telephone Encounter (Signed)
Spoke to patient to advise FMLA paperwork for both her and her husband had been updated and faxed

## 2016-03-05 NOTE — Telephone Encounter (Addendum)
fmla papers for husband designated 6 hours per each occurrence. B/c of this husband will not be going to work at all on days of chemo so the papers need to designate 8 hours per each occurrence. The company is [employer]. Justify the time d/t an hour drive each way for the patient.   flma for pt: she needs a date range February 28 2016 to April 21, 2016. This will be when her FMLA runs out. Her FMLA is with [employer].  At which time she will have short term and long term disability to help cover.   S/w Tiffany and she will correct papers and bring to Dr Alvy Bimler  She will be at [facility] on Friday.

## 2016-03-07 ENCOUNTER — Ambulatory Visit (HOSPITAL_BASED_OUTPATIENT_CLINIC_OR_DEPARTMENT_OTHER): Payer: Managed Care, Other (non HMO) | Admitting: Hematology and Oncology

## 2016-03-07 ENCOUNTER — Ambulatory Visit: Payer: Managed Care, Other (non HMO)

## 2016-03-07 ENCOUNTER — Encounter: Payer: Self-pay | Admitting: Hematology and Oncology

## 2016-03-07 ENCOUNTER — Other Ambulatory Visit: Payer: Self-pay | Admitting: Hematology and Oncology

## 2016-03-07 ENCOUNTER — Ambulatory Visit (HOSPITAL_BASED_OUTPATIENT_CLINIC_OR_DEPARTMENT_OTHER): Payer: Managed Care, Other (non HMO)

## 2016-03-07 ENCOUNTER — Other Ambulatory Visit (HOSPITAL_BASED_OUTPATIENT_CLINIC_OR_DEPARTMENT_OTHER): Payer: Managed Care, Other (non HMO)

## 2016-03-07 ENCOUNTER — Telehealth: Payer: Self-pay | Admitting: *Deleted

## 2016-03-07 ENCOUNTER — Telehealth: Payer: Self-pay | Admitting: Hematology and Oncology

## 2016-03-07 DIAGNOSIS — C562 Malignant neoplasm of left ovary: Secondary | ICD-10-CM

## 2016-03-07 DIAGNOSIS — I2602 Saddle embolus of pulmonary artery with acute cor pulmonale: Secondary | ICD-10-CM

## 2016-03-07 DIAGNOSIS — N183 Chronic kidney disease, stage 3 unspecified: Secondary | ICD-10-CM

## 2016-03-07 DIAGNOSIS — C561 Malignant neoplasm of right ovary: Secondary | ICD-10-CM

## 2016-03-07 DIAGNOSIS — C569 Malignant neoplasm of unspecified ovary: Secondary | ICD-10-CM

## 2016-03-07 DIAGNOSIS — Z5111 Encounter for antineoplastic chemotherapy: Secondary | ICD-10-CM | POA: Diagnosis not present

## 2016-03-07 DIAGNOSIS — Z95828 Presence of other vascular implants and grafts: Secondary | ICD-10-CM

## 2016-03-07 DIAGNOSIS — C563 Malignant neoplasm of bilateral ovaries: Secondary | ICD-10-CM

## 2016-03-07 DIAGNOSIS — D6481 Anemia due to antineoplastic chemotherapy: Secondary | ICD-10-CM

## 2016-03-07 DIAGNOSIS — T451X5A Adverse effect of antineoplastic and immunosuppressive drugs, initial encounter: Secondary | ICD-10-CM

## 2016-03-07 LAB — CBC WITH DIFFERENTIAL/PLATELET
BASO%: 0.5 % (ref 0.0–2.0)
Basophils Absolute: 0 10*3/uL (ref 0.0–0.1)
EOS%: 0.8 % (ref 0.0–7.0)
Eosinophils Absolute: 0.1 10*3/uL (ref 0.0–0.5)
HCT: 29.7 % — ABNORMAL LOW (ref 34.8–46.6)
HGB: 9.9 g/dL — ABNORMAL LOW (ref 11.6–15.9)
LYMPH%: 24.1 % (ref 14.0–49.7)
MCH: 30.7 pg (ref 25.1–34.0)
MCHC: 33.3 g/dL (ref 31.5–36.0)
MCV: 92 fL (ref 79.5–101.0)
MONO#: 0.8 10*3/uL (ref 0.1–0.9)
MONO%: 11.9 % (ref 0.0–14.0)
NEUT#: 4 10*3/uL (ref 1.5–6.5)
NEUT%: 62.7 % (ref 38.4–76.8)
Platelets: 257 10*3/uL (ref 145–400)
RBC: 3.23 10*6/uL — AB (ref 3.70–5.45)
RDW: 18.1 % — ABNORMAL HIGH (ref 11.2–14.5)
WBC: 6.4 10*3/uL (ref 3.9–10.3)
lymph#: 1.5 10*3/uL (ref 0.9–3.3)

## 2016-03-07 LAB — COMPREHENSIVE METABOLIC PANEL
ALT: 9 U/L (ref 0–55)
AST: 12 U/L (ref 5–34)
Albumin: 3.7 g/dL (ref 3.5–5.0)
Alkaline Phosphatase: 58 U/L (ref 40–150)
Anion Gap: 13 mEq/L — ABNORMAL HIGH (ref 3–11)
BUN: 15.1 mg/dL (ref 7.0–26.0)
CHLORIDE: 105 meq/L (ref 98–109)
CO2: 23 meq/L (ref 22–29)
CREATININE: 1.4 mg/dL — AB (ref 0.6–1.1)
Calcium: 10.5 mg/dL — ABNORMAL HIGH (ref 8.4–10.4)
EGFR: 49 mL/min/{1.73_m2} — ABNORMAL LOW (ref >90)
Glucose: 126 mg/dl (ref 70–140)
POTASSIUM: 4.2 meq/L (ref 3.5–5.1)
Sodium: 141 mEq/L (ref 136–145)
Total Bilirubin: 0.55 mg/dL (ref 0.20–1.20)
Total Protein: 8.2 g/dL (ref 6.4–8.3)

## 2016-03-07 MED ORDER — DEXTROSE 5 % IV SOLN
Freq: Once | INTRAVENOUS | Status: AC
  Administered 2016-03-07: 13:00:00 via INTRAVENOUS

## 2016-03-07 MED ORDER — SODIUM CHLORIDE 0.9% FLUSH
10.0000 mL | INTRAVENOUS | Status: DC | PRN
  Administered 2016-03-07: 10 mL
  Filled 2016-03-07: qty 10

## 2016-03-07 MED ORDER — HEPARIN SOD (PORK) LOCK FLUSH 100 UNIT/ML IV SOLN
500.0000 [IU] | Freq: Once | INTRAVENOUS | Status: AC | PRN
  Administered 2016-03-07: 500 [IU]
  Filled 2016-03-07: qty 5

## 2016-03-07 MED ORDER — DOXORUBICIN HCL LIPOSOMAL CHEMO INJECTION 2 MG/ML
39.0000 mg/m2 | Freq: Once | INTRAVENOUS | Status: AC
  Administered 2016-03-07: 80 mg via INTRAVENOUS
  Filled 2016-03-07: qty 40

## 2016-03-07 MED ORDER — DEXAMETHASONE SODIUM PHOSPHATE 10 MG/ML IJ SOLN
10.0000 mg | Freq: Once | INTRAMUSCULAR | Status: AC
  Administered 2016-03-07: 10 mg via INTRAVENOUS

## 2016-03-07 MED ORDER — DEXAMETHASONE SODIUM PHOSPHATE 10 MG/ML IJ SOLN
INTRAMUSCULAR | Status: AC
  Filled 2016-03-07: qty 1

## 2016-03-07 MED ORDER — ONDANSETRON HCL 4 MG/2ML IJ SOLN
INTRAMUSCULAR | Status: AC
  Filled 2016-03-07: qty 4

## 2016-03-07 MED ORDER — SODIUM CHLORIDE 0.9% FLUSH
10.0000 mL | INTRAVENOUS | Status: DC | PRN
  Administered 2016-03-07: 10 mL via INTRAVENOUS
  Filled 2016-03-07: qty 10

## 2016-03-07 MED ORDER — LORAZEPAM 1 MG PO TABS: 0.5000 mg | ORAL_TABLET | Freq: Once | ORAL | Status: DC | PRN

## 2016-03-07 MED ORDER — ONDANSETRON HCL 4 MG/2ML IJ SOLN
8.0000 mg | Freq: Once | INTRAMUSCULAR | Status: AC
  Administered 2016-03-07: 8 mg via INTRAVENOUS

## 2016-03-07 NOTE — Patient Instructions (Signed)
[facility] Discharge Instructions for Patients Receiving Chemotherapy  Today you received the following chemotherapy agents :  Doxil.  To help prevent nausea and vomiting after your treatment, we encourage you to take your nausea medication as prescribed.   If you develop nausea and vomiting that is not controlled by your nausea medication, call the clinic.   BELOW ARE SYMPTOMS THAT SHOULD BE REPORTED IMMEDIATELY:  *FEVER GREATER THAN 100.5 F  *CHILLS WITH OR WITHOUT FEVER  NAUSEA AND VOMITING THAT IS NOT CONTROLLED WITH YOUR NAUSEA MEDICATION  *UNUSUAL SHORTNESS OF BREATH  *UNUSUAL BRUISING OR BLEEDING  TENDERNESS IN MOUTH AND THROAT WITH OR WITHOUT PRESENCE OF ULCERS  *URINARY PROBLEMS  *BOWEL PROBLEMS  UNUSUAL RASH Items with * indicate a potential emergency and should be followed up as soon as possible.  Feel free to call the clinic you have any questions or concerns. The clinic phone number is [phone].  Please show the CHEMO ALERT CARD at check-in to the Emergency Department and triage nurse.   

## 2016-03-07 NOTE — Telephone Encounter (Signed)
Per 2/9 LOS and staff message I have scheduled appt. Gave patient calendar and notified the scheduler

## 2016-03-07 NOTE — Telephone Encounter (Signed)
Appointments scheduled per 2/9 LOS. Patient given AVS report and calendars with future scheduled appointments.  °

## 2016-03-07 NOTE — Patient Instructions (Signed)

## 2016-03-09 ENCOUNTER — Other Ambulatory Visit: Payer: Self-pay | Admitting: Hematology and Oncology

## 2016-03-09 DIAGNOSIS — C563 Malignant neoplasm of bilateral ovaries: Secondary | ICD-10-CM

## 2016-03-09 DIAGNOSIS — C562 Malignant neoplasm of left ovary: Principal | ICD-10-CM

## 2016-03-09 DIAGNOSIS — C561 Malignant neoplasm of right ovary: Secondary | ICD-10-CM

## 2016-03-09 NOTE — Assessment & Plan Note (Signed)
She is improving since discharge from hospital and is not using oxygen She will continue Lovenox for now I would favor Lovenox as long as she can tolerate it Will monitor for signs of bleeding

## 2016-03-09 NOTE — Assessment & Plan Note (Signed)
I reviewed the imaging studies myself Even though the radiologist reported "new mass" on CT angiogram, it is similar in appearance from prior CT Tumor marker is mildly elevated but could be a technical change because of delay on recent treatment I recommend proceed with Doxil only; Avastin is discontinued permanently due to recent PE She has repeat Ct abdomen scheduled on 2/22; I will call her with results She has appointment to see Dr. [place] again on 2/28 to review results and treatment recommendations

## 2016-03-09 NOTE — Assessment & Plan Note (Signed)
This is mildly elevated, likely due to dehydration Recommend increase oral fluid intake I plan to recheck again on 2/22

## 2016-03-09 NOTE — Progress Notes (Signed)
[place] FOLLOW-UP progress notes  Patient Care Team: Caryl Bis, MD as PCP - General (Family Medicine)  CHIEF COMPLAINTS/PURPOSE OF VISIT:  Recurrent ovarian cancer  HISTORY OF PRESENTING ILLNESS:  Audrey Porter 57 y.o. female was transferred to my care after her prior physician has left. She is here accompanied by her husband, Maylon Cos I reviewed the patient's records extensive and collaborated the history with the patient. Summary of her history is as follows:   Ovarian cancer, bilateral ([place])   03/16/2014 Imaging    Outside hospital CT scan of the abdomen and pelvis reviewed the pelvic mass measured 9.9 cm 20.1 cm 17.8 cm consistent with ovarian cancer. 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 Alycia Rossetti performed EXPLORATORY LAPAROTOMY, EXPLORATORY CELIOTOMY WITH OR WITHOUT BIOPSY(S) URETEROLYSIS, WITH OR WITHOUT REPOSITIONING OF URETER FOR RETROPERITONEAL FIBROSIS BILATERAL S&O W/OMENTECTOMY, TAH/RADICAL DISSECT FOR DEBULK; W/PELVIC LYMPHAD/LMTD PARA-AORTIC LYMPHADENECT         03/31/2014 Pathology Results    Diagnosis:  FSA, A:Ovary and fallopian tube, left, left salpingo-oophorectomy  Histologic type:Clear cell carcinoma (see comment)   Histologic grade:high grade/grade 3   Tumor site: (ovary/primary peritoneal)ovary   Tumor size: (greatest dimension)13 x 9.5 x 6.2 cm   Ovarian surface involvement:not identified   Status of capsule: (intact/ruptured)intact   Extent of involvement of other tissues/organs (select all that apply):  Right ovary  Left ovary  Lymphovascular space invasion:present   Regional lymph nodes (see other specimens): Total number  involved:1 Total number examined:10 Size of largest metastasis:microscopic focus only, approximately 1 mm;  necrosis, fibrosis, and foreign body type giant cell reaction present within two additional lymph nodes, changes suggestive of treatment effect or regression   Additional pathologic findings:none   AJCC Pathologic Stage: pT3a1(i) pN1 pMx FIGO (2014classification) Stage Grouping:IIIA1(i)  Note:This pathologic stage assessment is based on information available at the time of this report, and is subject to change pending clinical review and additional information.  B:Ovary and fallopian tube, right, right salpingo-oophorectomy  Histologic type:clear cell carcinoma (B8)  Histologic grade:high grade/grade 3   Tumor site: (ovary/primary peritoneal)ovary   Tumor size: (greatest dimension)11 x 9 x 4.5 cm   Ovarian surface involvement:not identified   Status of capsule: (intact/ruptured)ruptured   C:Uterus and cervix, hysterectomy  Cervix  - No tumor seen   Endometrium  - Atrophic endometrium   Myometrium  - Adenomyosis and subserosal endometriosis   D:Omentum, omentectomy - No tumor seen   E:Lymph nodes, right periaortic, regional dissection - Three lymph nodes, no tumor seen (0/3)   F:Lymph nodes, right pelvic, regional dissection - 1 out of 6 lymph nodes shows minute (approximately 2 mm) focus of metastatic adenocarcinoma (1/6) - Two additional lymph nodes show necrosis, fibrosis, and foreign body type giant cell reaction suggestive of treatment effect versus regression   G:Lymph node, left pelvic, regional dissection - One lymph node, no tumor seen (0/1)   COMMENT: Multiple immunostains are performed on block A12 to further evaluate the case for metastasis to the bilateral ovaries form a distant site and to evaluate for mucinous versus clear cell  change. The tumor shows focal intracytoplasmic mucin on mucin stains, and immunostains shows  the tumor to be cytokeratin 7 positive, cytokeratin 20 negative, CDX2 negative, PAX8 positive, and negative for chromogranin, synaptophysin, TTF1, p53, ER, and PR, and rare positive cells on CA125, with low p53 (approximately 5%). Napsin is positive. RCC and CD10 are negative. PAS is positive and the positive staining does not digest with PASD. These findings supporting a tumor of primary gynecologic origin. Overall, a clear cell carcinoma is favored. Seromucinous features cannot be excluded based on the presence of scattered, faint intracytoplasmic mucin noted on mucin stain and the morphology with focal signet ring cells. Although uncommon, this has been described, however, in clear cell carcinomas, therefore the above classification is favored       03/31/2014 Tumor Marker    Patient's tumor was tested for the following markers: CA125 Results of the tumor marker test revealed 41.3      05/11/2014 Tumor Marker    Patient's tumor was tested for the following markers: CA125 Results of the tumor marker test revealed 8      05/12/2014 - 09/01/2014 Chemotherapy    She received adjuvant chemotherapy with carbo/taxol for 6 cycles      05/12/2014 Genetic Testing    Genetics testing:OvaNext panel 05-12-14 identified a variant of uncertain significance, RAD51D, p.T103A      06/01/2014 Tumor Marker    Patient's tumor was tested for the following markers: CA125 Results of the tumor marker test revealed 10      06/27/2014 Tumor Marker    Patient's tumor was tested for the following markers: CA125 Results of the tumor marker test revealed 5      08/10/2014 Tumor Marker    .Patient's tumor was tested for the following markers: CA125 Results of the tumor marker test revealed 6      09/14/2014 Imaging    Segment of circumferential bowel wall narrowing at the junction of the sigmoid colon and rectum is concerning  for a serosal metastasis with subsequent constriction of the bowel lumen. There is a moderate volume of stool throughout the colon suggesting some degree of early obstruction. There is stool in the rectum. 2. Status post hysterectomy and resection of the cystic ovarian masses. 3. Three nodules adherent to the transverse colon are concerning for serosal metastasis. 4. Subcutaneous thickening at the umbilicus likely related to surgery.      09/14/2014 Tumor Marker    Patient's tumor was tested for the following markers: CA125 Results of the tumor marker test revealed 5      09/27/2014 PET scan    No abnormal hypermetabolism above normal colon at the rectosigmoid junction, at the site of suspected mass in narrowing on 09/14/2014. 2. Small soft tissue nodules along the distal transverse colon do not show abnormal hypermetabolism but may be too small for PET resolution.      10/05/2014 Procedure    She underwent sigmoid colon biopsy      10/05/2014 Pathology Results    Sigmoid colon biopsy revealed acute colitis, nonspecific      10/18/2014 Tumor Marker    Patient's tumor was tested for the following markers: CA125 Results of the tumor marker test revealed 5      01/08/2015 Tumor Marker    Patient's tumor was tested for the following markers: CA125 Results of the tumor marker test revealed 4      04/04/2015 Tumor Marker    Patient's tumor was tested for the following markers: CA125 Results of the tumor marker test revealed 6.9      07/12/2015 Tumor  Marker    Patient's tumor was tested for the following markers: CA125 Results of the tumor marker test revealed 7.5      10/10/2015 Tumor Marker    Patient's tumor was tested for the following markers: CA125 Results of the tumor marker test revealed 11.4      11/05/2015 Tumor Marker    Patient's tumor was tested for the following markers: CA125 Results of the tumor marker test revealed 18.4      11/19/2015 Imaging    1. Unfortunately,  there is extensive ovarian carcinoma metastasis in the abdomen and pelvis. 2. New large subcapsular metastatic masses along LEFT and RIGHT hepatic lobes and positioned between the diaphragm and the RIGHT hepatic lobe. 3. Peritoneal metastasis with new nodules within the mesentery of the small bowel and colon. 4. Potential serosal implant within the proximal sigmoid colon. No bowel obstruction 5. Small amount of ascites is new from prior. 6. Mild pelvic lymphadenopathy consistent with  metastatic disease      11/30/2015 Tumor Marker    Patient's tumor was tested for the following markers: CA125 Results of the tumor marker test revealed 32.4      12/04/2015 Procedure    Ultrasound and fluoroscopically guided right internal jugular single lumen power port catheter insertion. Tip in the SVC/RA junction. Catheter ready for use      12/06/2015 - 12/28/2015 Chemotherapy    She received 2 cycles of carbo/taxol, DC due to disease progression      12/21/2015 Tumor Marker    Patient's tumor was tested for the following markers: CA125 Results of the tumor marker test revealed 48.4      01/14/2016 Tumor Marker    Patient's tumor was tested for the following markers: CA125 Results of the tumor marker test revealed 74.6      01/16/2016 Imaging    Prominent interval progression of metastatic disease in the abdomen and pelvis. Large capsular lesions along the liver are associated with enlarging and new omental, mesenteric and peritoneal nodules and masses throughout the abdomen and pelvis. 2. Interval progression of intraperitoneal free fluid. 3. Interval development of new small left pleural effusion. 4. Lesion involving the sigmoid colon has progressed in the interval. No evidence for colonic obstruction at this time.      01/16/2016 Imaging    LV EF: 55% -   60%      01/25/2016 -  Chemotherapy    She received Avastin with Doxil. Avastin was discontinued when she developed massive PE        02/16/2016 Imaging    Massive bilateral occlusive pulmonary emboli. CT evidence of right heart strain (RV/LV Ratio = 1.46) consistent with at least submassive (intermediate risk) PE. The presence of right heart strain has been associated with an increased risk of morbidity and mortality. 2. Large layering RIGHT pleural effusion. Concern for malignant effusion. 3. New large mass in the gastrohepatic ligament (6 cm) concerning for progression of ovarian carcinoma.      02/17/2016 - 02/21/2016 Hospital Admission    She was admitted to the hospital and was found to have DVT and severe PE with heart strain. She was discharged home on Lovenox      02/18/2016 Imaging    Normal left ventricular size and systolic function. Severely dilated right ventricle with moderately decreased systolic function and severe pulmonary hypertension suspicious for pulmonary embolism.      02/18/2016 Imaging    Findings consistent with acute deep vein thrombosis involving the popliteal and distal  femoral veins of the left lower extremity.       02/25/2016 Tumor Marker    Patient's tumor was tested for the following markers: CA125 Results of the tumor marker test revealed 89.5      She is feeling better since recent discharge from hospital Has occasional dizziness No chest pain or dyspnea The patient denies any recent signs or symptoms of bleeding such as spontaneous epistaxis, hematuria or hematochezia. No recent mucositis, nausea or vomiting  MEDICAL HISTORY:  Past Medical History:  Diagnosis Date  . Acid reflux   . Acute saddle pulmonary embolism with acute cor pulmonale ([facility])   . Anemia associated with chemotherapy   . Hypertension   . Ovarian ca [facility])     SURGICAL HISTORY: Past Surgical History:  Procedure Laterality Date  . ABDOMINAL HYSTERECTOMY  03/31/14   at [facility]  . CESAREAN SECTION  1990  . CHOLECYSTECTOMY  2000  . IR GENERIC HISTORICAL  12/04/2015   IR FLUORO GUIDE PORT INSERTION RIGHT  12/04/2015 Greggory Keen, MD [facility]  . IR GENERIC HISTORICAL  12/04/2015   IR US GUIDE VASC ACCESS RIGHT 12/04/2015 Greggory Keen, MD [facility]  . NASAL SINUS SURGERY      SOCIAL HISTORY: Social History   Social History  . Marital status: Married    Spouse name: N/A  . Number of children: 1  . Years of education: N/A   Occupational History  . Not on file.   Social History Main Topics  . Smoking status: Never Smoker  . Smokeless tobacco: Never Used  . Alcohol use No  . Drug use: No  . Sexual activity: No   Other Topics Concern  . Not on file   Social History Narrative  . No narrative on file    FAMILY HISTORY: Family History  Problem Relation Age of Onset  . Cancer Brother 49    lung smoker  . Cancer Paternal Aunt 15    breast  . Cancer Maternal Grandmother 25    leukemia  . Cancer Paternal Grandmother     colorectal at unknown age    ALLERGIES:  is allergic to sulfa antibiotics.  MEDICATIONS:  Current Outpatient Prescriptions  Medication Sig Dispense Refill  . albuterol (PROVENTIL HFA;VENTOLIN HFA) 108 (90 Base) MCG/ACT inhaler Inhale 2 puffs into the lungs 2 (two) times daily as needed for wheezing or shortness of breath. 1 Inhaler 2  . cetirizine (ZYRTEC) 5 MG tablet Take 5 mg by mouth daily.    Marland Kitchen enoxaparin (LOVENOX) 80 MG/0.8ML injection Inject 0.8 mLs (80 mg total) into the skin every 12 (twelve) hours. 60 Syringe 0  . fluticasone (FLONASE) 50 MCG/ACT nasal spray Place 1 spray into both nostrils daily as needed. (Patient taking differently: Place 1 spray into both nostrils daily. ) 16 g 0  . lidocaine-prilocaine (EMLA) cream Apply 1-2 hours prior to [facility] access as directed. (Patient taking differently: Apply 1 application topically See admin instructions. Apply 1-2 hours prior to [facility] access as directed.) 30 g 1  . omeprazole (PRILOSEC) 20 MG capsule Take 20 mg by mouth at bedtime.    . ondansetron (ZOFRAN) 8 MG tablet Take 1 tablet (8  mg total) by mouth every 8 (eight) hours as needed for nausea (Will not make drowsy). 30 tablet 2  . polyethylene glycol (MIRALAX / GLYCOLAX) packet Take 17 g by mouth daily.     Marland Kitchen HYDROcodone-homatropine (HYCODAN) 5-1.5 MG/5ML syrup Take 5 mLs by mouth every 6 (six)  hours as needed for cough. (Patient not taking: Reported on 03/07/2016) 240 mL 0  . LORazepam (ATIVAN) 0.5 MG tablet Place 1 tablet under the tongue or swallow every 6 hrs as needed for nausea. Will make drowsy. (Patient not taking: Reported on 03/07/2016) 20 tablet 0   No current facility-administered medications for this visit.    Facility-Administered Medications Ordered in Other Visits  Medication Dose Route Frequency Provider Last Rate Last Dose  . ferumoxytol (FERAHEME) 510 mg in sodium chloride 0.9 % 100 mL IVPB  510 mg Intravenous Once Lennis Marion Downer, MD        REVIEW OF SYSTEMS:   Constitutional: Denies fevers, chills or abnormal night sweats Eyes: Denies blurriness of vision, double vision or watery eyes Ears, nose, mouth, throat, and face: Denies mucositis or sore throat Respiratory: Denies cough, dyspnea or wheezes Cardiovascular: Denies palpitation, chest discomfort or lower extremity swelling Gastrointestinal:  Denies nausea, heartburn or change in bowel habits Skin: Denies abnormal skin rashes Lymphatics: Denies new lymphadenopathy or easy bruising Neurological:Denies numbness, tingling or new weaknesses Behavioral/Psych: Mood is stable, no new changes  All other systems were reviewed with the patient and are negative.  PHYSICAL EXAMINATION: ECOG PERFORMANCE STATUS: 1 - Symptomatic but completely ambulatory  Vitals:   03/07/16 1119  BP: 138/81  Pulse: 99  Resp: 18  Temp: 97.9 F (36.6 C)   Filed Weights   03/07/16 1119  Weight: 185 lb 8 oz (84.1 kg)    GENERAL:alert, no distress and comfortable SKIN: skin color, texture, turgor are normal, no rashes or significant lesions EYES: normal, conjunctiva  are pink and non-injected, sclera clear OROPHARYNX:no exudate, normal lips, buccal mucosa, and tongue  NECK: supple, thyroid normal size, non-tender, without nodularity LYMPH:  no palpable lymphadenopathy in the cervical, axillary or inguinal LUNGS: clear to auscultation and percussion with normal breathing effort HEART: regular rate & rhythm and no murmurs without lower extremity edema ABDOMEN:abdomen soft, non-tender and normal bowel sounds Musculoskeletal:no cyanosis of digits and no clubbing  PSYCH: alert & oriented x 3 with fluent speech NEURO: no focal motor/sensory deficits  LABORATORY DATA:  I have reviewed the data as listed Lab Results  Component Value Date   WBC 6.4 03/07/2016   HGB 9.9 (L) 03/07/2016   HCT 29.7 (L) 03/07/2016   MCV 92.0 03/07/2016   PLT 257 03/07/2016    Recent Labs  02/08/16 1036  02/18/16 1041 02/19/16 0514 02/20/16 0531 02/25/16 1148 03/07/16 1051  NA 140  < > 141 139 141 139 141  K 4.1  < > 4.1 3.9 3.6 3.8 4.2  CL  --   < > 109 110 109  --   --   CO2 23  < > 21* 20* _0 GLUCOSE 121  < > 116* 94 100* 100 126  BUN 15.9  < > _1 10.4 15.1  CREATININE 1.1  < > 1.31* 1.27* 1.46* 1.1 1.4*  CALCIUM 9.6  < > 8.7* 8.7* 9.0 9.6 10.5*  GFRNONAA  --   < > 45* 46* 39*  --   --   GFRAA  --   < > 52* 54* 45*  --   --   PROT 7.6  --   --   --   --  7.4 8.2  ALBUMIN 3.7  --   --   --   --  3.4* 3.7  AST 11  --   --   --   --  15 12  ALT 10  --   --   --   --  14 9  ALKPHOS 66  --   --   --   --  59 58  BILITOT 0.55  --   --   --   --  0.70 0.55  < > = values in this interval not displayed.  RADIOGRAPHIC STUDIES: I have personally reviewed the radiological images as listed and agreed with the findings in the report. Dg Chest 2 View  Result Date: 02/17/2016 CLINICAL DATA:  Insert Insert EXAM: CHEST  2 VIEW COMPARISON:  None. FINDINGS: LEFT-sided port. Normal cardiac silhouette. No pulmonary edema. Lateral projection there LEFT lower lobe  density. IMPRESSION: LEFT lower lobe atelectasis, effusion, or infiltrate. Electronically Signed   By: Suzy Bouchard M.D.   On: 02/17/2016 16:05   Ct Head Wo Contrast  Result Date: 02/17/2016 CLINICAL DATA:  Dizziness x 2 days. Coughing for about couple months. Sob x 2 days. Pt denies chest pain. Pt denies headache. EXAM: CT HEAD WITHOUT CONTRAST TECHNIQUE: Contiguous axial images were obtained from the base of the skull through the vertex without intravenous contrast. COMPARISON:  07/02/2014 FINDINGS: Brain: No acute intracranial hemorrhage. No focal mass lesion. No CT evidence of acute infarction. No midline shift or mass effect. No hydrocephalus. Basilar cisterns are patent. Vascular: No hyperdense vessel or unexpected calcification. Skull: Normal. Negative for fracture or focal lesion. Sinuses/Orbits: Paranasal sinuses and mastoid air cells are clear. Orbits are clear. Other: None. IMPRESSION: No acute intracranial findings. Electronically Signed   By: Suzy Bouchard M.D.   On: 02/17/2016 22:35   Ct Angio Chest Pe W And/or Wo Contrast  Result Date: 02/17/2016 CLINICAL DATA:  Short of breath.  Tachycardia. EXAM: CT ANGIOGRAPHY CHEST WITH CONTRAST TECHNIQUE: Multidetector CT imaging of the chest was performed using the standard protocol during bolus administration of intravenous contrast. Multiplanar CT image reconstructions and MIPs were obtained to evaluate the vascular anatomy. CONTRAST:  100 mL Isovue COMPARISON:  None. FINDINGS: Cardiovascular: Tubular filling defects span the LEFT RIGHT main pulmonary artery (so-called saddle pulmonary emboli. Large destructive pulmonary emboli extend into the proximal LEFT and RIGHT lower lobe pulmonary arteries. Emboli extend into the LEFT and RIGHT upper lobe pulmonary arteries. Evidence of RIGHT ventricular strain with the RIGHT ventricular to LEFT ventricular diameter ratio equal 1.46. Mediastinum/Nodes: No axillary or supraclavicular adenopathy. No  mediastinal hilar adenopathy. Lungs/Pleura: There is a large layering LEFT pleural effusion. Mild LEFT basilar atelectasis. Upper Abdomen: New mass in the gastrohepatic ligament measuring 6.2 x 6.3 cm (image 120, series 4 subcapsular lesion adjacent RIGHT hepatic lobe on image 125, series 4 similar to comparison CT Musculoskeletal: No aggressive osseous lesion. Review of the MIP images confirms the above findings. IMPRESSION: 1. Massive bilateral occlusive pulmonary emboli. CT evidence of right heart strain (RV/LV Ratio = 1.46) consistent with at least submassive (intermediate risk) PE. The presence of right heart strain has been associated with an increased risk of morbidity and mortality. Please activate Code PE by paging [phone]. 2. Large layering RIGHT pleural effusion. Concern for malignant effusion. 3. New large mass in the gastrohepatic ligament (6 cm) concerning for progression of ovarian carcinoma. Critical Value/emergent results were called by telephone at the time of interpretation on 02/17/2016 at 10:45 pm to Dr. Avie Echevaria , who verbally acknowledged these results. Electronically Signed   By: Suzy Bouchard M.D.   On: 02/17/2016 22:47    ASSESSMENT & PLAN:  Ovarian cancer, bilateral (  [place]) I reviewed the imaging studies myself Even though the radiologist reported "new mass" on CT angiogram, it is similar in appearance from prior CT Tumor marker is mildly elevated but could be a technical change because of delay on recent treatment I recommend proceed with Doxil only; Avastin is discontinued permanently due to recent PE She has repeat Ct abdomen scheduled on 2/22; I will call her with results She has appointment to see Dr. [place] again on 2/28 to review results and treatment recommendations  Acute saddle pulmonary embolism with acute cor pulmonale ([place]) She is improving since discharge from hospital and is not using oxygen She will continue Lovenox for now I would favor Lovenox  as long as she can tolerate it Will monitor for signs of bleeding  CKD (chronic kidney disease) stage 3, GFR 30-59 ml/min OK to proceed with Lovenox as long as creatinine clearance is >35  Anemia due to antineoplastic chemotherapy Multifactorial from iron deficiency anemia and anemia doe to chemo. She has received recent IV iron and hemoglobin is improving  Hypercalcemia This is mildly elevated, likely due to dehydration Recommend increase oral fluid intake I plan to recheck again on 2/22   No orders of the defined types were placed in this encounter.   All questions were answered. The patient knows to call the clinic with any problems, questions or concerns. I spent 30 minutes counseling the patient face to face. The total time spent in the appointment was 60 minutes and more than 50% was on counseling.     Heath Lark, MD 03/09/2016 3:27 PM

## 2016-03-09 NOTE — Assessment & Plan Note (Signed)
Multifactorial from iron deficiency anemia and anemia doe to chemo. She has received recent IV iron and hemoglobin is improving

## 2016-03-09 NOTE — Assessment & Plan Note (Signed)
OK to proceed with Lovenox as long as creatinine clearance is >35

## 2016-03-12 ENCOUNTER — Other Ambulatory Visit: Payer: Self-pay

## 2016-03-12 DIAGNOSIS — C569 Malignant neoplasm of unspecified ovary: Secondary | ICD-10-CM

## 2016-03-12 DIAGNOSIS — I2602 Saddle embolus of pulmonary artery with acute cor pulmonale: Secondary | ICD-10-CM

## 2016-03-12 MED ORDER — ENOXAPARIN SODIUM 80 MG/0.8ML ~~LOC~~ SOLN: 80.0000 mg | Freq: Every day | SUBCUTANEOUS | 9 refills | Status: DC

## 2016-03-19 ENCOUNTER — Other Ambulatory Visit: Payer: Self-pay | Admitting: *Deleted

## 2016-03-19 ENCOUNTER — Telehealth: Payer: Self-pay

## 2016-03-19 ENCOUNTER — Other Ambulatory Visit: Payer: Self-pay | Admitting: Hematology and Oncology

## 2016-03-19 DIAGNOSIS — I2602 Saddle embolus of pulmonary artery with acute cor pulmonale: Secondary | ICD-10-CM

## 2016-03-19 DIAGNOSIS — C569 Malignant neoplasm of unspecified ovary: Secondary | ICD-10-CM

## 2016-03-19 MED ORDER — ENOXAPARIN SODIUM 80 MG/0.8ML ~~LOC~~ SOLN: 80.0000 mg | Freq: Two times a day (BID) | SUBCUTANEOUS | 9 refills | Status: DC

## 2016-03-19 NOTE — Telephone Encounter (Signed)
New prescription called to [facility] [phone]

## 2016-03-19 NOTE — Telephone Encounter (Signed)
I will print a new script

## 2016-03-19 NOTE — Telephone Encounter (Signed)
S/w pt that new script will be sent. Pt s/w Audrey Porter to call in the rx to [facility].

## 2016-03-19 NOTE — Telephone Encounter (Signed)
Pt called stating when [facility] called about her lovenox refill it was for daily. She has been taking q12 hours. She is asking if the new Rx for daily is correct. No mention of dose change in OV note.

## 2016-03-20 ENCOUNTER — Telehealth: Payer: Self-pay | Admitting: Hematology and Oncology

## 2016-03-20 ENCOUNTER — Ambulatory Visit (HOSPITAL_COMMUNITY): Payer: Managed Care, Other (non HMO)

## 2016-03-20 ENCOUNTER — Other Ambulatory Visit: Payer: Self-pay

## 2016-03-20 NOTE — Telephone Encounter (Signed)
Pt called to r/s lab/flush appt to 2/27 at 2 pm

## 2016-03-21 ENCOUNTER — Ambulatory Visit: Payer: Self-pay

## 2016-03-21 ENCOUNTER — Other Ambulatory Visit: Payer: Self-pay

## 2016-03-21 ENCOUNTER — Encounter: Payer: Self-pay | Admitting: Nutrition

## 2016-03-25 ENCOUNTER — Ambulatory Visit: Payer: Managed Care, Other (non HMO)

## 2016-03-25 ENCOUNTER — Encounter (HOSPITAL_COMMUNITY): Payer: Self-pay

## 2016-03-25 ENCOUNTER — Other Ambulatory Visit: Payer: Self-pay | Admitting: Hematology and Oncology

## 2016-03-25 ENCOUNTER — Other Ambulatory Visit (HOSPITAL_BASED_OUTPATIENT_CLINIC_OR_DEPARTMENT_OTHER): Payer: Managed Care, Other (non HMO)

## 2016-03-25 ENCOUNTER — Ambulatory Visit (HOSPITAL_COMMUNITY)
Admission: RE | Admit: 2016-03-25 | Discharge: 2016-03-25 | Disposition: A | Discharge status: Home / Self Care | Payer: Managed Care, Other (non HMO) | Source: Ambulatory Visit | Attending: Gynecologic Oncology | Admitting: Gynecologic Oncology

## 2016-03-25 DIAGNOSIS — R188 Other ascites: Secondary | ICD-10-CM | POA: Diagnosis not present

## 2016-03-25 DIAGNOSIS — C569 Malignant neoplasm of unspecified ovary: Secondary | ICD-10-CM | POA: Diagnosis present

## 2016-03-25 DIAGNOSIS — C561 Malignant neoplasm of right ovary: Secondary | ICD-10-CM

## 2016-03-25 DIAGNOSIS — C562 Malignant neoplasm of left ovary: Secondary | ICD-10-CM

## 2016-03-25 DIAGNOSIS — D490 Neoplasm of unspecified behavior of digestive system: Secondary | ICD-10-CM | POA: Insufficient documentation

## 2016-03-25 DIAGNOSIS — C563 Malignant neoplasm of bilateral ovaries: Secondary | ICD-10-CM

## 2016-03-25 DIAGNOSIS — J90 Pleural effusion, not elsewhere classified: Secondary | ICD-10-CM | POA: Diagnosis not present

## 2016-03-25 DIAGNOSIS — D6481 Anemia due to antineoplastic chemotherapy: Secondary | ICD-10-CM

## 2016-03-25 DIAGNOSIS — Z95828 Presence of other vascular implants and grafts: Secondary | ICD-10-CM

## 2016-03-25 DIAGNOSIS — T451X5A Adverse effect of antineoplastic and immunosuppressive drugs, initial encounter: Principal | ICD-10-CM

## 2016-03-25 LAB — COMPREHENSIVE METABOLIC PANEL
ALBUMIN: 3.6 g/dL (ref 3.5–5.0)
ALK PHOS: 58 U/L (ref 40–150)
ALT: 11 U/L (ref 0–55)
AST: 11 U/L (ref 5–34)
Anion Gap: 10 mEq/L (ref 3–11)
BILIRUBIN TOTAL: 0.45 mg/dL (ref 0.20–1.20)
BUN: 12.3 mg/dL (ref 7.0–26.0)
CALCIUM: 10.2 mg/dL (ref 8.4–10.4)
CO2: 24 mEq/L (ref 22–29)
CREATININE: 1.1 mg/dL (ref 0.6–1.1)
Chloride: 104 mEq/L (ref 98–109)
EGFR: 63 mL/min/{1.73_m2} — ABNORMAL LOW (ref >90)
GLUCOSE: 102 mg/dL (ref 70–140)
POTASSIUM: 4.4 meq/L (ref 3.5–5.1)
SODIUM: 138 meq/L (ref 136–145)
TOTAL PROTEIN: 7.7 g/dL (ref 6.4–8.3)

## 2016-03-25 LAB — CBC WITH DIFFERENTIAL/PLATELET
BASO%: 0.5 % (ref 0.0–2.0)
BASOS ABS: 0 10*3/uL (ref 0.0–0.1)
EOS%: 1 % (ref 0.0–7.0)
Eosinophils Absolute: 0 10*3/uL (ref 0.0–0.5)
HEMATOCRIT: 23.8 % — AB (ref 34.8–46.6)
HEMOGLOBIN: 8.1 g/dL — AB (ref 11.6–15.9)
LYMPH#: 1.4 10*3/uL (ref 0.9–3.3)
LYMPH%: 36.3 % (ref 14.0–49.7)
MCH: 31.5 pg (ref 25.1–34.0)
MCHC: 34 g/dL (ref 31.5–36.0)
MCV: 92.6 fL (ref 79.5–101.0)
MONO#: 0.5 10*3/uL (ref 0.1–0.9)
MONO%: 13.3 % (ref 0.0–14.0)
NEUT%: 48.9 % (ref 38.4–76.8)
NEUTROS ABS: 1.9 10*3/uL (ref 1.5–6.5)
Platelets: 198 10*3/uL (ref 145–400)
RBC: 2.57 10*6/uL — ABNORMAL LOW (ref 3.70–5.45)
RDW: 17.3 % — AB (ref 11.2–14.5)
WBC: 3.8 10*3/uL — AB (ref 3.9–10.3)

## 2016-03-25 MED ORDER — SODIUM CHLORIDE 0.9% FLUSH
10.0000 mL | Freq: Once | INTRAVENOUS | Status: AC
  Administered 2016-03-25: 10 mL
  Filled 2016-03-25: qty 10

## 2016-03-25 MED ORDER — IOPAMIDOL (ISOVUE-300) INJECTION 61%
INTRAVENOUS | Status: AC
  Filled 2016-03-25: qty 100

## 2016-03-25 MED ORDER — IOPAMIDOL (ISOVUE-300) INJECTION 61%
100.0000 mL | Freq: Once | INTRAVENOUS | Status: AC | PRN
  Administered 2016-03-25: 100 mL via INTRAVENOUS

## 2016-03-25 MED ORDER — HEPARIN SOD (PORK) LOCK FLUSH 100 UNIT/ML IV SOLN
INTRAVENOUS | Status: AC
  Filled 2016-03-25: qty 5

## 2016-03-25 MED ORDER — HEPARIN SOD (PORK) LOCK FLUSH 100 UNIT/ML IV SOLN
500.0000 [IU] | Freq: Once | INTRAVENOUS | Status: AC
  Administered 2016-03-25: 500 [IU] via INTRAVENOUS

## 2016-03-26 ENCOUNTER — Telehealth: Payer: Self-pay

## 2016-03-26 ENCOUNTER — Encounter: Payer: Self-pay | Admitting: Internal Medicine

## 2016-03-26 ENCOUNTER — Ambulatory Visit (INDEPENDENT_AMBULATORY_CARE_PROVIDER_SITE_OTHER): Payer: Managed Care, Other (non HMO) | Admitting: Internal Medicine

## 2016-03-26 ENCOUNTER — Other Ambulatory Visit: Payer: Self-pay | Admitting: Hematology and Oncology

## 2016-03-26 ENCOUNTER — Other Ambulatory Visit (HOSPITAL_BASED_OUTPATIENT_CLINIC_OR_DEPARTMENT_OTHER): Payer: Managed Care, Other (non HMO)

## 2016-03-26 ENCOUNTER — Encounter: Payer: Self-pay | Admitting: Gynecologic Oncology

## 2016-03-26 ENCOUNTER — Ambulatory Visit: Payer: Managed Care, Other (non HMO) | Attending: Gynecologic Oncology | Admitting: Gynecologic Oncology

## 2016-03-26 ENCOUNTER — Ambulatory Visit (HOSPITAL_COMMUNITY)
Admission: RE | Admit: 2016-03-26 | Discharge: 2016-03-26 | Disposition: A | Discharge status: Home / Self Care | Payer: Managed Care, Other (non HMO) | Source: Ambulatory Visit | Attending: Hematology and Oncology | Admitting: Hematology and Oncology

## 2016-03-26 VITALS — BP 132/74 | HR 106 | Temp 98.5°F | Resp 18 | Wt 180.8 lb

## 2016-03-26 VITALS — BP 122/80 | HR 106 | Ht 65.0 in | Wt 180.0 lb

## 2016-03-26 DIAGNOSIS — I2602 Saddle embolus of pulmonary artery with acute cor pulmonale: Secondary | ICD-10-CM | POA: Diagnosis not present

## 2016-03-26 DIAGNOSIS — T451X5A Adverse effect of antineoplastic and immunosuppressive drugs, initial encounter: Principal | ICD-10-CM

## 2016-03-26 DIAGNOSIS — C561 Malignant neoplasm of right ovary: Secondary | ICD-10-CM | POA: Diagnosis present

## 2016-03-26 DIAGNOSIS — R630 Anorexia: Secondary | ICD-10-CM

## 2016-03-26 DIAGNOSIS — D6481 Anemia due to antineoplastic chemotherapy: Secondary | ICD-10-CM

## 2016-03-26 DIAGNOSIS — C569 Malignant neoplasm of unspecified ovary: Secondary | ICD-10-CM | POA: Insufficient documentation

## 2016-03-26 DIAGNOSIS — D649 Anemia, unspecified: Secondary | ICD-10-CM | POA: Insufficient documentation

## 2016-03-26 DIAGNOSIS — R42 Dizziness and giddiness: Secondary | ICD-10-CM

## 2016-03-26 DIAGNOSIS — J45991 Cough variant asthma: Secondary | ICD-10-CM | POA: Diagnosis not present

## 2016-03-26 DIAGNOSIS — R188 Other ascites: Secondary | ICD-10-CM | POA: Diagnosis not present

## 2016-03-26 DIAGNOSIS — I2699 Other pulmonary embolism without acute cor pulmonale: Secondary | ICD-10-CM | POA: Diagnosis not present

## 2016-03-26 MED ORDER — FAMOTIDINE 20 MG PO TABS: ORAL_TABLET | ORAL | 2 refills | Status: DC

## 2016-03-26 MED ORDER — PREDNISONE 10 MG PO TABS: ORAL_TABLET | ORAL | 0 refills | Status: DC

## 2016-03-26 MED ORDER — BUDESONIDE-FORMOTEROL FUMARATE 80-4.5 MCG/ACT IN AERO: 2.0000 | INHALATION_SPRAY | Freq: Two times a day (BID) | RESPIRATORY_TRACT | 0 refills | Status: DC

## 2016-03-26 NOTE — Telephone Encounter (Signed)
Called [facility] for appt tomorrow for 2 units of blood. 0900 appt on 3-1, instructed patient to be at [facility] center at 0900 tomorrow.

## 2016-03-26 NOTE — Patient Instructions (Addendum)
Prilosec 20 x 2 = 40 mg   Take  30-60 min before first meal of the day and Pepcid  (famotidine)  20 mg one @  bedtime until return to office - this is the best way to tell whether stomach acid is contributing to your problem.    Prednisone 10 mg take  4 each am x 2 days,   2 each am x 2 days,  1 each am x 2 days and stop   Start symbicort 80 Take 2 puffs first thing in am and then another 2 puffs about 12 hours later.   Work on inhaler technique:  relax and gently blow all the way out then take a nice smooth deep breath back in, triggering the inhaler at same time you start breathing in.  Hold for up to 5 seconds if you can. Blow out thru nose. Rinse and gargle with water when done   GERD (REFLUX)  is an extremely common cause of respiratory symptoms just like yours , many times with no obvious heartburn at all.    It can be treated with medication, but also with lifestyle changes including elevation of the head of your bed (ideally with 6 inch  bed blocks),  Smoking cessation, avoidance of late meals, excessive alcohol, and avoid fatty foods, chocolate, peppermint, colas, red wine, and acidic juices such as orange juice.  NO MINT OR MENTHOL PRODUCTS SO NO COUGH DROPS   USE SUGARLESS CANDY INSTEAD (Jolley ranchers or Stover's or Life Savers) or even ice chips will also do - the key is to swallow to prevent all throat clearing. NO OIL BASED VITAMINS - use powdered substitutes.  Suppress cough x 3-5 days then try off all cough suppression   Will call call to schedule sinus CT and I will call you with the results   Please schedule a follow up office visit in 2  weeks, sooner if needed  Add: cxr on return f/u R effusion

## 2016-03-26 NOTE — Progress Notes (Signed)
Subjective:     Patient ID: Audrey Porter, female   DOB: 1961-01-02,     MRN: [id number]  HPI  60 yowf never smoker first dx ovarian ca March 2016  referred to pulmonary clinic 03/26/2016 by Dr   Alvy Bimler for cough s/p PE  Admit date: 02/17/2016 Discharge date: 02/21/2016  Time spent: 35 minutes  Recommendations for Outpatient Follow-up:  1. Dr.Livesay 1/29 2. PCP Dr.Daniel in 1-2weeks    Discharge Diagnoses:  Principal Problem:   Acute saddle pulmonary embolism with acute cor pulmonale (HCC) with L DVT Active Problems:   Ovarian cancer, bilateral (HCC)   Environmental allergies   HTN (hypertension), benign   Anemia due to antineoplastic chemotherapy   CKD (chronic kidney disease) stage 3, GFR 30-59 ml/min   Discharge Condition: stable  Diet recommendation: heart healthy       Filed Weights   02/18/16 0702 02/20/16 0428 02/21/16 0420  Weight: 87.3 kg (192 lb 6.4 oz) 87.6 kg (193 lb 3.2 oz) 87.7 kg (193 lb 4.8 oz)    History of present illness:  Audrey Porter a 56 y.o.femalewith a past medical history significant for metastatic ovarian CAwho admitted with Extensive PE with RV Strain and R heart dilation.  Hospital Course:  Massive BilateralPE and acute DVT -CT scan 02/17/16  showed massive B/LPE, ECHO with severely dilated Rv and moderately decreased RV fucntion - Venous doppler 02/18/16 c/w L DVT -Seen by PCCM this admission, no thrombolytics recommended -she was started on IV heparin and then once stabilized transitioned to SQ lovenox-due to concomittant malignancy - lovenox teaching completed -hemodynamics improved, weaned off O2, ambulating more at discharge -advised quick FU with Dr.Livesay  Ovarian Ca: -followedby Dr. Marko Plume. Currently on Doxil and bevacizumab  History of hypertension: -Hyzaar on hold, consider resuming at FU depending on BP trend  Anemia due to chemo, cancer -stable  CKD III : Baseline Cr 1.1-1.4,  stable.    03/26/2016 [facility] office visit/ Audrey Porter   Chief Complaint  Patient presents with  . Pulm Consult    Chronic cough for the past 2 months. Non-productive cough. Denies chest pains but has a history of SOB due to clots in lungs.   pt remembers sinus problems came and went "all her life" esp in spring > fall stuffy nose but  no cough or asthma and then much worse 2012 assoc with cough > underwent sinus surgery by Dr Redmond Pulling and helped a lot around 2013  But since around before xmas 2017 abrupt  onset of cough with similar sinus symptoms > eval by Freddie Breech rx by albuterol seemed to help when she used it  and flonase / saline and codeine cough syrups help some as well but still with lingering cough x 2 m duration   Cough is persistent daily/ dry cough/ hacking / worse during day, not needing cough med noct but worse at hs then better as she sleeps Worse with voice use also / has not tried albuterol again but not when she did "respond" to it she was also given pred same date - 02/25/16  No obvious day to day or daytime variability or assoc excess/ purulent sputum or mucus plugs or hemoptysis or cp or chest tightness, subjective wheeze or overt sinus or hb symptoms. No unusual exp hx or h/o childhood pna/ asthma or knowledge of premature birth.  Sleeping ok p settle down without nocturnal  or early am exacerbation  of respiratory  c/o's or need for  noct saba. Also denies any obvious fluctuation of symptoms with weather or environmental changes or other aggravating or alleviating factors except as outlined above   Current Medications, Allergies, Complete Past Medical History, Past Surgical History, Family History, and Social History were reviewed in [employer] record.  ROS  The following are not active complaints unless bolded sore throat, dysphagia, dental problems, itching, sneezing,  nasal congestion or excess/ purulent secretions, ear ache,   fever,  chills, sweats, unintended wt loss, classically pleuritic or exertional cp,  orthopnea pnd or leg swelling, presyncope, palpitations, abdominal pain when coughing, anorexia, nausea, vomiting, diarrhea  or change in bowel or bladder habits, change in stools or urine, dysuria,hematuria,  rash, arthralgias, visual complaints, headache, numbness, weakness or ataxia or problems with walking or coordination,  change in mood/affect or memory.              Review of Systems     Objective:   Physical Exam amb obese bf nad  Wt Readings from Last 3 Encounters:  03/26/16 180 lb (81.6 kg)  03/26/16 180 lb 12.8 oz (82 kg)  03/07/16 185 lb 8 oz (84.1 kg)    Vital signs reviewed - Note on arrival 02 sats  100% on RA     HEENT: nl dentition, turbinates bilaterally, and oropharynx. Nl external ear canals without cough reflex   NECK :  without JVD/Nodes/TM/ nl carotid upstrokes bilaterally   LUNGS: no acc muscle use,  Nl contour chest  With no cough on insp/exp/ minimal dullness and decreased bs at R>L  base   CV:  RRR  no s3 or murmur or increase in P2, and no edema   ABD:  soft and nontender with nl inspiratory excursion in the supine position. No bruits or organomegaly appreciated, bowel sounds nl  MS:  Nl gait/ ext warm without deformities, calf tenderness, cyanosis or clubbing No obvious joint restrictions   SKIN: warm and dry without lesions    NEURO:  alert, approp, nl sensorium with  no motor or cerebellar deficits apparent.      I personally reviewed images and agree with radiology impression as follows:  CTa  Chest  02/17/16 1. Massive bilateral occlusive pulmonary emboli. CT evidence of right heart strain (RV/LV Ratio = 1.46) consistent with at least submassive (intermediate risk) PE. The presence of right heart strain has been associated with an increased risk of morbidity and mortality. Please activate Code PE by paging [phone]. 2. Large layering RIGHT pleural  effusion. Concern for malignant effusion. 3. New large mass in the gastrohepatic ligament (6 cm) concerning for progression of ovarian carcinoma.           Assessment:

## 2016-03-26 NOTE — Progress Notes (Signed)
Consult Note: Gyn-Onc  Audrey Porter 56 y.o. female  CC:  Chief Complaint  Patient presents with  . Ovarian Cancer    OZD:[id number] is a very pleasant 56 year old who had a CT scan in February 2016 that revealed hepatic perihepatic ascites and a pelvic mass which measured 9.9 x 20.1 x 17.8 cm which was cystic and solid. CA-125 was 56. She underwent TAH/BSO omentectomy pelvic empiric lymph node evaluation has not had an R0 resection 03/31/2014. 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 Her final stage was a stage IIIa. She completed 6 cycles of paclitaxel and carboplatin based chemotherapy 09/01/2014. PET/CT showed no clear residual or progressive disease. She had a colonoscopy secondary to some narrowing noted on imaging that was unremarkable. 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  Her CA-125 was 7.5 in June 2017 and then it increased 18.4 in October. CT scan showed new progression and a subcapsular lesion in the area of the liver. There was disease at the proximal sigmoid colon and in the mesentery with small volume ascites. She started retreatment chemotherapy with paclitaxel and carboplatin 12/06/2015. She experienced increased abdominal discomfort and an increase in her CA-125 from 32 at baseline up to 48 in late November and up to 74 after cycle #2 which she received 12/28/2015. She had a CT scan of the abdomen and pelvis on December 20 that showed progression around her liver with lesser increase in the region of the sigmoid colon. It was felt that she was platinum and taxine resistant. She underwent treatment with liposomal doxorubicin and bevacizumab on October 29. She received single agent bevacizumab again on January 12 and had a saddle embolism with a lower extremity DVT on 02/17/2016.  Her most recent CT scan January 18 revealed massive bilateral occlusive pulmonary embolism. Her spiral CT stated that there was a new large  mass in the gastrohepatic ligament measuring 6 cm that was concerning for progression of her ovarian cancer. However, I reviewed this image with radiology and her prior CT scan from December 17 noted the 7.9 x 7.8 subcapsular mass lesion and was unchanged. She transitioned her care to Dr. Alvy Bimler and is status post cycle number 2 of liposomal doxorubicin. Should a CT scan on February 27 that revealed:  IMPRESSION: 1. No acute findings within the abdomen or pelvis. 2. Large volume of peritoneal tumor is identified within the abdomen and pelvis. This is stable to slightly increased in size in the interval. 3. Decrease in volume of ascites 4. Left pleural effusion is slightly increased in volume from previous exam.  I went over her most recent CT scan with her and her husband line by line. There are 3 things that is improved including her ascites, the left lobe of the liver mass, and the left upper quadrant nodule. The other areas have slightly increased in size. The measurements that were provided by radiology did not necessarily follow online with increase in size in the interval as most of them actually decreased by very small amounts. The changes are millimeters. The largest changes in the left sided mass that went down 1.1 cm. I think considering the issues we've had with chemotherapy and that she is tolerating the liposomal doxorubicin quite well and would recommend 2 additional cycles of liposomal doxorubicin followed by another scan. Her CO2 5 has not been particularly accurate marker for her. She is not eating very well and her husband is concerned about this. He would  like her to eat more. She does endorse early satiety. She is not particularly symptomatic from the ascites but she does have some occasional left upper quadrant pain. We reviewed her lab work and she is fairly anemic with a hematocrit of 23. She's had blood transfusions in the past and would be amenable to 1 again. She does feel  lightheaded which she's in the shower when she stands up quickly from sitting. Next  Her daughter is going to the medical school match on March 16 and she would like to be in [place] to help celebrate that with her. Her next cycle of chemotherapy scheduled for March 9.   Current Meds:  Outpatient Encounter Prescriptions as of 03/26/2016  Medication Sig  . cetirizine (ZYRTEC) 5 MG tablet Take 5 mg by mouth daily.  Marland Kitchen enoxaparin (LOVENOX) 80 MG/0.8ML injection Inject 0.8 mLs (80 mg total) into the skin every 12 (twelve) hours.  . fluticasone (FLONASE) 50 MCG/ACT nasal spray Place 1 spray into both nostrils daily as needed. (Patient taking differently: Place 1 spray into both nostrils daily. )  . HYDROcodone-homatropine (HYCODAN) 5-1.5 MG/5ML syrup Take 5 mLs by mouth every 6 (six) hours as needed for cough.  . lidocaine-prilocaine (EMLA) cream Apply 1-2 hours prior to [facility] access as directed. (Patient taking differently: Apply 1 application topically See admin instructions. Apply 1-2 hours prior to [facility] access as directed.)  . LORazepam (ATIVAN) 0.5 MG tablet Place 1 tablet under the tongue or swallow every 6 hrs as needed for nausea. Will make drowsy.  Marland Kitchen omeprazole (PRILOSEC) 20 MG capsule Take 20 mg by mouth at bedtime.  . ondansetron (ZOFRAN) 8 MG tablet Take 1 tablet (8 mg total) by mouth every 8 (eight) hours as needed for nausea (Will not make drowsy).  . polyethylene glycol (MIRALAX / GLYCOLAX) packet Take 17 g by mouth daily.   Marland Kitchen albuterol (PROVENTIL HFA;VENTOLIN HFA) 108 (90 Base) MCG/ACT inhaler Inhale 2 puffs into the lungs 2 (two) times daily as needed for wheezing or shortness of breath.   Facility-Administered Encounter Medications as of 03/26/2016  Medication  . ferumoxytol (FERAHEME) 510 mg in sodium chloride 0.9 % 100 mL IVPB  . [DISCONTINUED] heparin lock flush 100 UNIT/ML injection  . [DISCONTINUED] iopamidol (ISOVUE-300) 61 % injection    Allergy:  Allergies   Allergen Reactions  . Sulfa Antibiotics Hives    Social Hx:   Social History   Social History  . Marital status: Married    Spouse name: N/A  . Number of children: 1  . Years of education: N/A   Occupational History  . Not on file.   Social History Main Topics  . Smoking status: Never Smoker  . Smokeless tobacco: Never Used  . Alcohol use No  . Drug use: No  . Sexual activity: No   Other Topics Concern  . Not on file   Social History Narrative  . No narrative on file    Past Surgical Hx:  Past Surgical History:  Procedure Laterality Date  . ABDOMINAL HYSTERECTOMY  03/31/14   at [facility]  . CESAREAN SECTION  1990  . CHOLECYSTECTOMY  2000  . IR GENERIC HISTORICAL  12/04/2015   IR FLUORO GUIDE PORT INSERTION RIGHT 12/04/2015 Greggory Keen, [facility]  . IR GENERIC HISTORICAL  12/04/2015   IR US GUIDE VASC ACCESS RIGHT 12/04/2015 Greggory Keen, MD [facility]  . NASAL SINUS SURGERY      Past Medical Hx:  Past Medical History:  Diagnosis Date  . Acid reflux   . Acute saddle pulmonary embolism with acute cor pulmonale (HCC)   . Anemia associated with chemotherapy   . Hypertension   . Ovarian ca [facility])     Oncology Hx:    Ovarian cancer, bilateral ([place])   03/16/2014 Imaging    Outside hospital CT scan of the abdomen and pelvis reviewed the pelvic mass measured 9.9 cm 20.1 cm 17.8 cm consistent with ovarian cancer. 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 Alycia Rossetti performed EXPLORATORY LAPAROTOMY, EXPLORATORY CELIOTOMY WITH OR WITHOUT BIOPSY(S) URETEROLYSIS, WITH OR WITHOUT REPOSITIONING OF URETER FOR RETROPERITONEAL FIBROSIS BILATERAL S&O W/OMENTECTOMY, TAH/RADICAL DISSECT FOR DEBULK; W/PELVIC LYMPHAD/LMTD PARA-AORTIC LYMPHADENECT          03/31/2014 Pathology Results    Diagnosis:  FSA, A:Ovary and fallopian tube, left, left salpingo-oophorectomy  Histologic type:Clear cell carcinoma (see comment)   Histologic grade:high grade/grade 3   Tumor site: (ovary/primary peritoneal)ovary   Tumor size: (greatest dimension)13 x 9.5 x 6.2 cm   Ovarian surface involvement:not identified   Status of capsule: (intact/ruptured)intact   Extent of involvement of other tissues/organs (select all that apply):  Right ovary  Left ovary  Lymphovascular space invasion:present   Regional lymph nodes (see other specimens): Total number involved:1 Total number examined:10 Size of largest metastasis:microscopic focus only, approximately 1 mm;  necrosis, fibrosis, and foreign body type giant cell reaction present within two additional lymph nodes, changes suggestive of treatment effect or regression   Additional pathologic findings:none   AJCC Pathologic Stage: pT3a1(i) pN1 pMx FIGO (2014classification) Stage Grouping:IIIA1(i)  Note:This pathologic stage assessment is based on information available at the time of this report, and is subject to change pending clinical review and additional information.  B:Ovary and fallopian tube, right, right salpingo-oophorectomy  Histologic type:clear cell carcinoma (B8)  Histologic grade:high grade/grade 3   Tumor site: (ovary/primary peritoneal)ovary   Tumor size: (greatest dimension)11 x 9 x 4.5 cm   Ovarian surface involvement:not identified   Status of capsule: (intact/ruptured)ruptured   C:Uterus and cervix, hysterectomy  Cervix  - No tumor seen   Endometrium  - Atrophic endometrium   Myometrium  - Adenomyosis and subserosal endometriosis   D:Omentum, omentectomy - No tumor seen   E:Lymph  nodes, right periaortic, regional dissection - Three lymph nodes, no tumor seen (0/3)   F:Lymph nodes, right pelvic, regional dissection - 1 out of 6 lymph nodes shows minute (approximately 2 mm) focus of metastatic adenocarcinoma (1/6) - Two additional lymph nodes show necrosis, fibrosis, and foreign body type giant cell reaction suggestive of treatment effect versus regression   G:Lymph node, left pelvic, regional dissection - One lymph node, no tumor seen (0/1)   COMMENT: Multiple immunostains are performed on block A12 to further evaluate the case for metastasis to the bilateral ovaries form a distant site and to evaluate for mucinous versus clear cell change. The tumor shows focal intracytoplasmic mucin on mucin stains, and immunostains shows the tumor to be cytokeratin 7 positive, cytokeratin 20 negative, CDX2 negative, PAX8 positive, and negative for chromogranin, synaptophysin, TTF1, p53, ER, and PR, and rare positive cells on CA125, with low p53 (approximately 5%). Napsin is positive. [place]  and CD10 are negative. PAS is positive and the positive staining does not digest with PASD. These findings supporting a tumor of primary gynecologic origin. Overall, a clear cell carcinoma is favored. Seromucinous features cannot be excluded based on the presence of scattered, faint intracytoplasmic mucin noted on mucin stain and the morphology with focal signet ring cells. Although uncommon, this has been described, however, in clear cell carcinomas, therefore the above classification is favored       03/31/2014 Tumor Marker    Patient's tumor was tested for the following markers: CA125 Results of the tumor marker test revealed 41.3      05/11/2014 Tumor Marker    Patient's tumor was tested for the following markers: CA125 Results of the tumor marker test revealed 8      05/12/2014 - 09/01/2014 Chemotherapy    She received adjuvant chemotherapy with carbo/taxol for 6 cycles      05/12/2014  Genetic Testing    Genetics testing:OvaNext panel 05-12-14 identified a variant of uncertain significance, RAD51D, p.T103A      06/01/2014 Tumor Marker    Patient's tumor was tested for the following markers: CA125 Results of the tumor marker test revealed 10      06/27/2014 Tumor Marker    Patient's tumor was tested for the following markers: CA125 Results of the tumor marker test revealed 5      08/10/2014 Tumor Marker    .Patient's tumor was tested for the following markers: CA125 Results of the tumor marker test revealed 6      09/14/2014 Imaging    Segment of circumferential bowel wall narrowing at the junction of the sigmoid colon and rectum is concerning for a serosal metastasis with subsequent constriction of the bowel lumen. There is a moderate volume of stool throughout the colon suggesting some degree of early obstruction. There is stool in the rectum. 2. Status post hysterectomy and resection of the cystic ovarian masses. 3. Three nodules adherent to the transverse colon are concerning for serosal metastasis. 4. Subcutaneous thickening at the umbilicus likely related to surgery.      09/14/2014 Tumor Marker    Patient's tumor was tested for the following markers: CA125 Results of the tumor marker test revealed 5      09/27/2014 PET scan    No abnormal hypermetabolism above normal colon at the rectosigmoid junction, at the site of suspected mass in narrowing on 09/14/2014. 2. Small soft tissue nodules along the distal transverse colon do not show abnormal hypermetabolism but may be too small for PET resolution.      10/05/2014 Procedure    She underwent sigmoid colon biopsy      10/05/2014 Pathology Results    Sigmoid colon biopsy revealed acute colitis, nonspecific      10/18/2014 Tumor Marker    Patient's tumor was tested for the following markers: CA125 Results of the tumor marker test revealed 5      01/08/2015 Tumor Marker    Patient's tumor was tested for the  following markers: CA125 Results of the tumor marker test revealed 4      04/04/2015 Tumor Marker    Patient's tumor was tested for the following markers: CA125 Results of the tumor marker test revealed 6.9      07/12/2015 Tumor Marker    Patient's tumor was tested for the following markers: CA125 Results of the tumor marker test revealed 7.5      10/10/2015 Tumor Marker    Patient's tumor was tested for the  following markers: CA125 Results of the tumor marker test revealed 11.4      11/05/2015 Tumor Marker    Patient's tumor was tested for the following markers: CA125 Results of the tumor marker test revealed 18.4      11/19/2015 Imaging    1. 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 2. New large subcapsular metastatic masses along LEFT and RIGHT hepatic lobes and positioned between the diaphragm and the RIGHT hepatic lobe. 3. Peritoneal metastasis with new nodules within the mesentery of the small bowel and colon. 4. Potential serosal implant within the proximal sigmoid colon. No bowel obstruction 5. Small amount of ascites is new from prior. 6. Mild pelvic lymphadenopathy consistent with  metastatic disease      11/30/2015 Tumor Marker    Patient's tumor was tested for the following markers: CA125 Results of the tumor marker test revealed 32.4      12/04/2015 Procedure    Ultrasound and fluoroscopically guided right internal jugular single lumen power port catheter insertion. Tip in the SVC/RA junction. Catheter ready for use      12/06/2015 - 12/28/2015 Chemotherapy    She received 2 cycles of carbo/taxol, DC due to disease progression      12/21/2015 Tumor Marker    Patient's tumor was tested for the following markers: CA125 Results of the tumor marker test revealed 48.4      01/14/2016 Tumor Marker    Patient's tumor was tested for the following markers: CA125 Results of the tumor marker test revealed 74.6       01/16/2016 Imaging    Prominent interval progression of metastatic disease in the abdomen and pelvis. Large capsular lesions along the liver are associated with enlarging and new omental, mesenteric and peritoneal nodules and masses throughout the abdomen and pelvis. 2. Interval progression of intraperitoneal free fluid. 3. Interval development of new small left pleural effusion. 4. Lesion involving the sigmoid colon has progressed in the interval. No evidence for colonic obstruction at this time.      01/16/2016 Imaging    LV EF: 55% -   60%      01/25/2016 -  Chemotherapy    She received Avastin with Doxil. Avastin was discontinued when she developed massive PE       02/16/2016 Imaging    Massive bilateral occlusive pulmonary emboli. CT evidence of right heart strain (RV/LV Ratio = 1.46) consistent with at least submassive (intermediate risk) PE. The presence of right heart strain has been associated with an increased risk of morbidity and mortality. 2. Large layering RIGHT pleural effusion. Concern for malignant effusion. 3. New large mass in the gastrohepatic ligament (6 cm) concerning for progression of ovarian carcinoma.      02/17/2016 - 02/21/2016 Hospital Admission    She was admitted to the hospital and was found to have DVT and severe PE with heart strain. She was discharged home on Lovenox      02/18/2016 Imaging    Normal left ventricular size and systolic function. Severely dilated right ventricle with moderately decreased systolic function and severe pulmonary hypertension suspicious for pulmonary embolism.      02/18/2016 Imaging    Findings consistent with acute deep vein thrombosis involving the popliteal and distal femoral veins of the left lower extremity.       02/25/2016 Tumor Marker    Patient's tumor was tested for the following markers: CA125 Results of the tumor marker test revealed 89.5  Family Hx:  Family History  Problem Relation Age of Onset  .  Cancer Brother 67    lung smoker  . Cancer Paternal Aunt 20    breast  . Cancer Maternal Grandmother 25    leukemia  . Cancer Paternal Grandmother     colorectal at unknown age    33:  Blood pressure 132/74, pulse (!) 106, temperature 98.5 F (36.9 C), resp. rate 18, weight 180 lb 12.8 oz (82 kg), SpO2 100 %.  Physical Exam:  Well-nourished vulva female in no acute distress.  Assessment/Plan: Very pleasant 56 year old with recurrent stage IIIa ovarian carcinoma who is platinum resistant as she had progression during the treatment for her initial platinum sensitive disease. She was initially started on liposomal doxorubicin and bevacizumab. However she had a fairly significant pulmonary embolism after week #2 of bevacizumab and is currently received 2 cycles of single agent liposomal doxorubicin. She has stable disease on her scan and I would recommend continuing with liposomal doxorubicin and she's tolerating it well. I do think she would benefit from a blood transfusion. I'll communicate this with Dr. Alvy Bimler. She has follow-up scheduled with Dr. Alvy Bimler as scheduled for cycle #3 of chemotherapy on March 9.  She will continue taking her iron supplementation. I did discuss her anemia with her and I feel that the lightheadedness she experiences when getting out of the shower secondary to basal dilatation from the warm water. She does have a CT in her shower and was told to exercise caution.  We discussed nutrition. We recommended multiple small meals throughout the day and constant snacking. She understands that she may need to force herself to eat even when she doesn't feel like it. There is no associated nausea vomiting sore do feel that she would be able to tolerate more by mouth. She has normal bowel bowel and bladder function.  Greater than 40 minutes face to face time was spent with the patient and her husband all of which was spent in consultation and discussion regarding her scans  and treatment.  Rexton Greulich A., MD 03/26/2016, 2:58 PM

## 2016-03-27 ENCOUNTER — Encounter (HOSPITAL_COMMUNITY): Payer: Self-pay

## 2016-03-27 ENCOUNTER — Ambulatory Visit (HOSPITAL_COMMUNITY)
Admission: RE | Admit: 2016-03-27 | Discharge: 2016-03-27 | Disposition: A | Discharge status: Home / Self Care | Payer: Managed Care, Other (non HMO) | Source: Ambulatory Visit | Attending: Hematology and Oncology | Admitting: Hematology and Oncology

## 2016-03-27 ENCOUNTER — Other Ambulatory Visit: Payer: Self-pay | Admitting: Internal Medicine

## 2016-03-27 DIAGNOSIS — T451X5A Adverse effect of antineoplastic and immunosuppressive drugs, initial encounter: Secondary | ICD-10-CM | POA: Diagnosis present

## 2016-03-27 DIAGNOSIS — D6481 Anemia due to antineoplastic chemotherapy: Secondary | ICD-10-CM

## 2016-03-27 DIAGNOSIS — J45991 Cough variant asthma: Secondary | ICD-10-CM

## 2016-03-27 LAB — PREPARE RBC (CROSSMATCH)

## 2016-03-27 MED ORDER — SODIUM CHLORIDE 0.9 % IV SOLN
250.0000 mL | Freq: Once | INTRAVENOUS | Status: AC
  Administered 2016-03-27: 250 mL via INTRAVENOUS

## 2016-03-27 MED ORDER — HEPARIN SOD (PORK) LOCK FLUSH 100 UNIT/ML IV SOLN: 250.0000 [IU] | INTRAVENOUS | Status: DC | PRN

## 2016-03-27 MED ORDER — HEPARIN SOD (PORK) LOCK FLUSH 100 UNIT/ML IV SOLN
500.0000 [IU] | Freq: Every day | INTRAVENOUS | Status: AC | PRN
  Administered 2016-03-27: 500 [IU]
  Filled 2016-03-27: qty 5

## 2016-03-27 MED ORDER — DIPHENHYDRAMINE HCL 25 MG PO CAPS
25.0000 mg | ORAL_CAPSULE | Freq: Once | ORAL | Status: AC
  Administered 2016-03-27: 25 mg via ORAL
  Filled 2016-03-27: qty 1

## 2016-03-27 MED ORDER — SODIUM CHLORIDE 0.9% FLUSH
10.0000 mL | INTRAVENOUS | Status: AC | PRN
  Administered 2016-03-27: 10 mL

## 2016-03-27 MED ORDER — SODIUM CHLORIDE 0.9% FLUSH: 3.0000 mL | INTRAVENOUS | Status: DC | PRN

## 2016-03-27 MED ORDER — ACETAMINOPHEN 325 MG PO TABS
650.0000 mg | ORAL_TABLET | Freq: Once | ORAL | Status: AC
  Administered 2016-03-27: 650 mg via ORAL
  Filled 2016-03-27: qty 2

## 2016-03-27 NOTE — Addendum Note (Signed)
Addended by: Christinia Gully B on: 03/27/2016 01:32 PM   Modules accepted: Orders

## 2016-03-27 NOTE — Assessment & Plan Note (Signed)
-  CT scan 02/17/16  showed massive B/LPE, ECHO with severely dilated Rv and moderately decreased RV fucntion - Venous doppler 02/18/16 c/w L DVT - Lovenox rx as related to Ovarian Ca > f/u heme/onc  She does have ? Assoc pleural effusion on CT but breathing has improved and no cough on inspiration so likely this is just paraembolic/ parainfarction related fluid and will recheck cxr on return to be sure it's resolving otherwise may need tap

## 2016-03-27 NOTE — Progress Notes (Signed)
Provider: Gorsuch, N  Procedure: 2 units PRBC's via port a cath  Diagnosis Association: Anemia due to antineoplastic chemotherapy (D64.81, T45.1X5A)   Treatment: Patient received 2 units PRBC's via port a cath. Patient tolerated procedure well with no transfusion reaction. Discharge instructions given to patient and patient states an understanding. Patient alert, oriented, and ambulatory at time of discharge. 

## 2016-03-27 NOTE — Assessment & Plan Note (Signed)
03/26/2016  After extensive coaching HFA effectiveness =    75% > try symbiocrt 80 2bid  - Sinus CT 03/26/2016 >>> .  The most common causes of chronic cough in immunocompetent adults include the following: upper airway cough syndrome (UACS), previously referred to as postnasal drip syndrome (PNDS), which is caused by variety of rhinosinus conditions; (2) asthma; (3) GERD; (4) chronic bronchitis from cigarette smoking or other inhaled environmental irritants; (5) nonasthmatic eosinophilic bronchitis; and (6) bronchiectasis.   These conditions, singly or in combination, have accounted for up to 94% of the causes of chronic cough in prospective studies.   Other conditions have constituted no >6% of the causes in prospective studies These have included bronchogenic carcinoma, chronic interstitial pneumonia, sarcoidosis, left ventricular failure, ACEI-induced cough, and aspiration from a condition associated with pharyngeal dysfunction.    Chronic cough is often simultaneously caused by more than one condition. A single cause has been found from 38 to 82% of the time, multiple causes from 18 to 62%. Multiply caused cough has been the result of three diseases up to 42% of the time.       This is either cough variant asthma or Upper airway cough syndrome (previously labeled PNDS) , is  so named because it's frequently impossible to sort out how much is  CR/sinusitis with freq throat clearing (which can be related to primary GERD)   vs  causing  secondary (" extra esophageal")  GERD from wide swings in gastric pressure that occur with throat clearing, often  promoting self use of mint and menthol lozenges that reduce the lower esophageal sphincter tone and exacerbate the problem further in a cyclical fashion.   These are the same pts (now being labeled as having "irritable larynx syndrome" by some cough centers) who not infrequently have a history of having failed to tolerate ace inhibitors,  dry powder  inhalers or biphosphonates or report having atypical/extraesophageal reflux symptoms that don't respond to standard doses of PPI  and are easily confused as having aecopd or asthma flares by even experienced allergists/ pulmonologists (myself included).   Will rx as cough variant asthma with short course of pred/ symb 80 2bid and gerd rx/diet then return in 2 weeks to regroup

## 2016-03-27 NOTE — Discharge Instructions (Signed)

## 2016-03-28 ENCOUNTER — Other Ambulatory Visit: Payer: Self-pay | Admitting: Hematology and Oncology

## 2016-03-28 DIAGNOSIS — C563 Malignant neoplasm of bilateral ovaries: Secondary | ICD-10-CM

## 2016-03-28 DIAGNOSIS — C561 Malignant neoplasm of right ovary: Secondary | ICD-10-CM

## 2016-03-28 DIAGNOSIS — C562 Malignant neoplasm of left ovary: Principal | ICD-10-CM

## 2016-03-28 LAB — BPAM RBC
BLOOD PRODUCT EXPIRATION DATE: 201803272359
Blood Product Expiration Date: 201803282359
ISSUE DATE / TIME: 201803010908
ISSUE DATE / TIME: 201803010908
Unit Type and Rh: 5100
Unit Type and Rh: 5100

## 2016-03-28 LAB — TYPE AND SCREEN
ABO/RH(D): B POS
Antibody Screen: NEGATIVE
Unit division: 0
Unit division: 0

## 2016-04-01 ENCOUNTER — Other Ambulatory Visit: Payer: Self-pay | Admitting: Hematology and Oncology

## 2016-04-01 ENCOUNTER — Ambulatory Visit (HOSPITAL_COMMUNITY): Payer: Managed Care, Other (non HMO)

## 2016-04-03 ENCOUNTER — Other Ambulatory Visit: Payer: Self-pay | Admitting: *Deleted

## 2016-04-03 ENCOUNTER — Telehealth: Payer: Self-pay | Admitting: Hematology and Oncology

## 2016-04-03 DIAGNOSIS — C562 Malignant neoplasm of left ovary: Principal | ICD-10-CM

## 2016-04-03 DIAGNOSIS — C561 Malignant neoplasm of right ovary: Secondary | ICD-10-CM

## 2016-04-03 DIAGNOSIS — C563 Malignant neoplasm of bilateral ovaries: Secondary | ICD-10-CM

## 2016-04-03 NOTE — Telephone Encounter (Signed)
Received disability paperwork to be completed  °

## 2016-04-04 ENCOUNTER — Ambulatory Visit (HOSPITAL_BASED_OUTPATIENT_CLINIC_OR_DEPARTMENT_OTHER): Payer: Managed Care, Other (non HMO)

## 2016-04-04 ENCOUNTER — Ambulatory Visit: Payer: Managed Care, Other (non HMO)

## 2016-04-04 ENCOUNTER — Other Ambulatory Visit (HOSPITAL_BASED_OUTPATIENT_CLINIC_OR_DEPARTMENT_OTHER): Payer: Managed Care, Other (non HMO)

## 2016-04-04 ENCOUNTER — Ambulatory Visit (HOSPITAL_BASED_OUTPATIENT_CLINIC_OR_DEPARTMENT_OTHER): Payer: Managed Care, Other (non HMO) | Admitting: Hematology and Oncology

## 2016-04-04 ENCOUNTER — Telehealth: Payer: Self-pay | Admitting: Hematology and Oncology

## 2016-04-04 ENCOUNTER — Ambulatory Visit (HOSPITAL_COMMUNITY)
Admission: RE | Admit: 2016-04-04 | Discharge: 2016-04-04 | Disposition: A | Discharge status: Home / Self Care | Payer: Managed Care, Other (non HMO) | Source: Ambulatory Visit | Attending: Internal Medicine | Admitting: Internal Medicine

## 2016-04-04 ENCOUNTER — Encounter: Payer: Self-pay | Admitting: Hematology and Oncology

## 2016-04-04 DIAGNOSIS — C561 Malignant neoplasm of right ovary: Secondary | ICD-10-CM

## 2016-04-04 DIAGNOSIS — C563 Malignant neoplasm of bilateral ovaries: Secondary | ICD-10-CM

## 2016-04-04 DIAGNOSIS — R0789 Other chest pain: Secondary | ICD-10-CM | POA: Diagnosis not present

## 2016-04-04 DIAGNOSIS — J45991 Cough variant asthma: Secondary | ICD-10-CM | POA: Insufficient documentation

## 2016-04-04 DIAGNOSIS — T451X5A Adverse effect of antineoplastic and immunosuppressive drugs, initial encounter: Secondary | ICD-10-CM

## 2016-04-04 DIAGNOSIS — I2602 Saddle embolus of pulmonary artery with acute cor pulmonale: Secondary | ICD-10-CM

## 2016-04-04 DIAGNOSIS — D6481 Anemia due to antineoplastic chemotherapy: Secondary | ICD-10-CM | POA: Diagnosis not present

## 2016-04-04 DIAGNOSIS — C562 Malignant neoplasm of left ovary: Principal | ICD-10-CM

## 2016-04-04 DIAGNOSIS — C569 Malignant neoplasm of unspecified ovary: Secondary | ICD-10-CM

## 2016-04-04 DIAGNOSIS — G620 Drug-induced polyneuropathy: Secondary | ICD-10-CM

## 2016-04-04 DIAGNOSIS — Z5111 Encounter for antineoplastic chemotherapy: Secondary | ICD-10-CM

## 2016-04-04 LAB — CBC WITH DIFFERENTIAL/PLATELET
BASO%: 0.7 % (ref 0.0–2.0)
BASOS ABS: 0 10*3/uL (ref 0.0–0.1)
EOS ABS: 0 10*3/uL (ref 0.0–0.5)
EOS%: 0.5 % (ref 0.0–7.0)
HEMATOCRIT: 28.1 % — AB (ref 34.8–46.6)
HGB: 9.5 g/dL — ABNORMAL LOW (ref 11.6–15.9)
LYMPH%: 36.9 % (ref 14.0–49.7)
MCH: 31.3 pg (ref 25.1–34.0)
MCHC: 33.8 g/dL (ref 31.5–36.0)
MCV: 92.9 fL (ref 79.5–101.0)
MONO#: 1.1 10*3/uL — ABNORMAL HIGH (ref 0.1–0.9)
MONO%: 17.3 % — AB (ref 0.0–14.0)
NEUT#: 3 10*3/uL (ref 1.5–6.5)
NEUT%: 44.6 % (ref 38.4–76.8)
Platelets: 277 10*3/uL (ref 145–400)
RBC: 3.03 10*6/uL — ABNORMAL LOW (ref 3.70–5.45)
RDW: 16.5 % — ABNORMAL HIGH (ref 11.2–14.5)
WBC: 6.6 10*3/uL (ref 3.9–10.3)
lymph#: 2.5 10*3/uL (ref 0.9–3.3)

## 2016-04-04 LAB — COMPREHENSIVE METABOLIC PANEL
ALK PHOS: 57 U/L (ref 40–150)
ALT: 11 U/L (ref 0–55)
AST: 10 U/L (ref 5–34)
Albumin: 3.6 g/dL (ref 3.5–5.0)
Anion Gap: 11 mEq/L (ref 3–11)
BUN: 17.1 mg/dL (ref 7.0–26.0)
CALCIUM: 10 mg/dL (ref 8.4–10.4)
CHLORIDE: 102 meq/L (ref 98–109)
CO2: 25 mEq/L (ref 22–29)
Creatinine: 1.1 mg/dL (ref 0.6–1.1)
EGFR: 68 mL/min/{1.73_m2} — AB (ref >90)
Glucose: 100 mg/dl (ref 70–140)
POTASSIUM: 4.3 meq/L (ref 3.5–5.1)
SODIUM: 138 meq/L (ref 136–145)
Total Bilirubin: 0.59 mg/dL (ref 0.20–1.20)
Total Protein: 7.4 g/dL (ref 6.4–8.3)

## 2016-04-04 MED ORDER — HEPARIN SOD (PORK) LOCK FLUSH 100 UNIT/ML IV SOLN
500.0000 [IU] | Freq: Once | INTRAVENOUS | Status: AC | PRN
Start: 2016-04-04 — End: 2016-04-04
  Administered 2016-04-04: 500 [IU]
  Filled 2016-04-04: qty 5

## 2016-04-04 MED ORDER — HYDROMORPHONE HCL 2 MG PO TABS: 2.0000 mg | ORAL_TABLET | ORAL | 0 refills | Status: DC | PRN

## 2016-04-04 MED ORDER — DEXAMETHASONE SODIUM PHOSPHATE 10 MG/ML IJ SOLN
10.0000 mg | Freq: Once | INTRAMUSCULAR | Status: AC
  Administered 2016-04-04: 10 mg via INTRAVENOUS

## 2016-04-04 MED ORDER — SODIUM CHLORIDE 0.9% FLUSH
10.0000 mL | INTRAVENOUS | Status: DC | PRN
  Administered 2016-04-04: 10 mL
  Filled 2016-04-04: qty 10

## 2016-04-04 MED ORDER — ONDANSETRON HCL 4 MG/2ML IJ SOLN
8.0000 mg | Freq: Once | INTRAMUSCULAR | Status: AC
  Administered 2016-04-04: 8 mg via INTRAVENOUS

## 2016-04-04 MED ORDER — ONDANSETRON HCL 4 MG/2ML IJ SOLN
INTRAMUSCULAR | Status: AC
  Filled 2016-04-04: qty 4

## 2016-04-04 MED ORDER — DEXTROSE 5 % IV SOLN
Freq: Once | INTRAVENOUS | Status: AC
  Administered 2016-04-04: 12:00:00 via INTRAVENOUS

## 2016-04-04 MED ORDER — LORAZEPAM 1 MG PO TABS
0.5000 mg | ORAL_TABLET | Freq: Once | ORAL | Status: AC | PRN
  Administered 2016-04-04: 0.5 mg via ORAL

## 2016-04-04 MED ORDER — SODIUM CHLORIDE 0.9 % IJ SOLN
10.0000 mL | Freq: Once | INTRAMUSCULAR | Status: AC
  Administered 2016-04-04: 10 mL
  Filled 2016-04-04: qty 10

## 2016-04-04 MED ORDER — DOXORUBICIN HCL LIPOSOMAL CHEMO INJECTION 2 MG/ML
39.0000 mg/m2 | Freq: Once | INTRAVENOUS | Status: AC
  Administered 2016-04-04: 80 mg via INTRAVENOUS
  Filled 2016-04-04: qty 40

## 2016-04-04 MED ORDER — DEXAMETHASONE SODIUM PHOSPHATE 10 MG/ML IJ SOLN
INTRAMUSCULAR | Status: AC
  Filled 2016-04-04: qty 1

## 2016-04-04 MED ORDER — LORAZEPAM 1 MG PO TABS
ORAL_TABLET | ORAL | Status: AC
  Filled 2016-04-04: qty 1

## 2016-04-04 NOTE — Telephone Encounter (Signed)
Labs, flush, Chemo and follow up wit Dr Alvy Bimler, was scheduled on 05/02/16 per 04/04/16 los. Patient was given a copy of the AVS report and appointment schedule per 04/04/16 los.

## 2016-04-04 NOTE — Assessment & Plan Note (Signed)
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  We will observe only

## 2016-04-04 NOTE — Progress Notes (Signed)
[facility] OFFICE PROGRESS NOTE  Patient Care Team: Caryl Bis, MD as PCP - General (Family Medicine)  SUMMARY OF ONCOLOGIC HISTORY:   Ovarian cancer, bilateral ([place])   03/16/2014 Imaging    Outside hospital CT scan of the abdomen and pelvis reviewed the pelvic mass measured 9.9 cm 20.1 cm 17.8 cm consistent with ovarian cancer. 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 Alycia Rossetti performed EXPLORATORY LAPAROTOMY, EXPLORATORY CELIOTOMY WITH OR WITHOUT BIOPSY(S) URETEROLYSIS, WITH OR WITHOUT REPOSITIONING OF URETER FOR RETROPERITONEAL FIBROSIS BILATERAL S&O W/OMENTECTOMY, TAH/RADICAL DISSECT FOR DEBULK; W/PELVIC LYMPHAD/LMTD PARA-AORTIC LYMPHADENECT         03/31/2014 Pathology Results    Diagnosis:  FSA, A:Ovary and fallopian tube, left, left salpingo-oophorectomy  Histologic type:Clear cell carcinoma (see comment)   Histologic grade:high grade/grade 3   Tumor site: (ovary/primary peritoneal)ovary   Tumor size: (greatest dimension)13 x 9.5 x 6.2 cm   Ovarian surface involvement:not identified   Status of capsule: (intact/ruptured)intact   Extent of involvement of other tissues/organs (select all that apply):  Right ovary  Left ovary  Lymphovascular space invasion:present   Regional lymph nodes (see other specimens): Total number involved:1 Total number examined:10 Size of largest metastasis:microscopic focus only, approximately 1 mm;  necrosis, fibrosis, and foreign body type giant cell reaction present within two additional lymph nodes, changes suggestive of treatment effect or regression   Additional pathologic  findings:none   AJCC Pathologic Stage: pT3a1(i) pN1 pMx FIGO (2014classification) Stage Grouping:IIIA1(i)  Note:This pathologic stage assessment is based on information available at the time of this report, and is subject to change pending clinical review and additional information.  B:Ovary and fallopian tube, right, right salpingo-oophorectomy  Histologic type:clear cell carcinoma (B8)  Histologic grade:high grade/grade 3   Tumor site: (ovary/primary peritoneal)ovary   Tumor size: (greatest dimension)11 x 9 x 4.5 cm   Ovarian surface involvement:not identified   Status of capsule: (intact/ruptured)ruptured   C:Uterus and cervix, hysterectomy  Cervix  - No tumor seen   Endometrium  - Atrophic endometrium   Myometrium  - Adenomyosis and subserosal endometriosis   D:Omentum, omentectomy - No tumor seen   E:Lymph nodes, right periaortic, regional dissection - Three lymph nodes, no tumor seen (0/3)   F:Lymph nodes, right pelvic, regional dissection - 1 out of 6 lymph nodes shows minute (approximately 2 mm) focus of metastatic adenocarcinoma (1/6) - Two additional lymph nodes show necrosis, fibrosis, and foreign body type giant cell reaction suggestive of treatment effect versus regression   G:Lymph node, left pelvic, regional dissection - One lymph node, no tumor seen (0/1)   COMMENT: Multiple immunostains are performed on block A12 to further evaluate the case for metastasis to the bilateral ovaries form a distant site and to evaluate for mucinous versus clear cell change. The tumor shows focal intracytoplasmic mucin on mucin stains, and immunostains shows the tumor to be cytokeratin 7 positive, cytokeratin 20 negative, CDX2 negative, PAX8 positive, and negative for chromogranin, synaptophysin, TTF1, p53, ER, and PR, and rare positive cells on CA125, with low p53 (approximately 5%). Napsin is  positive. RCC and CD10 are negative. PAS is positive and the positive staining does not digest with PASD.  These findings supporting a tumor of primary gynecologic origin. Overall, a clear cell carcinoma is favored. Seromucinous features cannot be excluded based on the presence of scattered, faint intracytoplasmic mucin noted on mucin stain and the morphology with focal signet ring cells. Although uncommon, this has been described, however, in clear cell carcinomas, therefore the above classification is favored       03/31/2014 Tumor Marker    Patient's tumor was tested for the following markers: CA125 Results of the tumor marker test revealed 41.3      05/11/2014 Tumor Marker    Patient's tumor was tested for the following markers: CA125 Results of the tumor marker test revealed 8      05/12/2014 - 09/01/2014 Chemotherapy    She received adjuvant chemotherapy with carbo/taxol for 6 cycles      05/12/2014 Genetic Testing    Genetics testing:OvaNext panel 05-12-14 identified a variant of uncertain significance, RAD51D, p.T103A      06/01/2014 Tumor Marker    Patient's tumor was tested for the following markers: CA125 Results of the tumor marker test revealed 10      06/27/2014 Tumor Marker    Patient's tumor was tested for the following markers: CA125 Results of the tumor marker test revealed 5      08/10/2014 Tumor Marker    .Patient's tumor was tested for the following markers: CA125 Results of the tumor marker test revealed 6      09/14/2014 Imaging    Segment of circumferential bowel wall narrowing at the junction of the sigmoid colon and rectum is concerning for a serosal metastasis with subsequent constriction of the bowel lumen. There is a moderate volume of stool throughout the colon suggesting some degree of early obstruction. There is stool in the rectum. 2. Status post hysterectomy and resection of the cystic ovarian masses. 3. Three nodules adherent to the transverse colon are  concerning for serosal metastasis. 4. Subcutaneous thickening at the umbilicus likely related to surgery.      09/14/2014 Tumor Marker    Patient's tumor was tested for the following markers: CA125 Results of the tumor marker test revealed 5      09/27/2014 PET scan    No abnormal hypermetabolism above normal colon at the rectosigmoid junction, at the site of suspected mass in narrowing on 09/14/2014. 2. Small soft tissue nodules along the distal transverse colon do not show abnormal hypermetabolism but may be too small for PET resolution.      10/05/2014 Procedure    She underwent sigmoid colon biopsy      10/05/2014 Pathology Results    Sigmoid colon biopsy revealed acute colitis, nonspecific      10/18/2014 Tumor Marker    Patient's tumor was tested for the following markers: CA125 Results of the tumor marker test revealed 5      01/08/2015 Tumor Marker    Patient's tumor was tested for the following markers: CA125 Results of the tumor marker test revealed 4      04/04/2015 Tumor Marker    Patient's tumor was tested for the following markers: CA125 Results of the tumor marker test revealed 6.9      07/12/2015 Tumor Marker    Patient's tumor was tested for the following markers: CA125 Results of the tumor marker test revealed 7.5      10/10/2015 Tumor Marker    Patient's tumor was tested for the following markers: CA125 Results of the tumor marker test revealed 11.4      11/05/2015  Tumor Marker    Patient's tumor was tested for the following markers: CA125 Results of the tumor marker test revealed 18.4      11/19/2015 Imaging    1. 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 2. New large subcapsular metastatic masses along LEFT and RIGHT hepatic lobes and positioned between the diaphragm and the RIGHT hepatic lobe. 3. Peritoneal metastasis with new nodules within the mesentery of the small bowel and colon. 4. Potential serosal  implant within the proximal sigmoid colon. No bowel obstruction 5. Small amount of ascites is new from prior. 6. Mild pelvic lymphadenopathy consistent with  metastatic disease      11/30/2015 Tumor Marker    Patient's tumor was tested for the following markers: CA125 Results of the tumor marker test revealed 32.4      12/04/2015 Procedure    Ultrasound and fluoroscopically guided right internal jugular single lumen power port catheter insertion. Tip in the SVC/RA junction. Catheter ready for use      12/06/2015 - 12/28/2015 Chemotherapy    She received 2 cycles of carbo/taxol, DC due to disease progression      12/21/2015 Tumor Marker    Patient's tumor was tested for the following markers: CA125 Results of the tumor marker test revealed 48.4      01/14/2016 Tumor Marker    Patient's tumor was tested for the following markers: CA125 Results of the tumor marker test revealed 74.6      01/16/2016 Imaging    Prominent interval progression of metastatic disease in the abdomen and pelvis. Large capsular lesions along the liver are associated with enlarging and new omental, mesenteric and peritoneal nodules and masses throughout the abdomen and pelvis. 2. Interval progression of intraperitoneal free fluid. 3. Interval development of new small left pleural effusion. 4. Lesion involving the sigmoid colon has progressed in the interval. No evidence for colonic obstruction at this time.      01/16/2016 Imaging    LV EF: 55% -   60%      01/25/2016 -  Chemotherapy    She received Avastin with Doxil. Avastin was discontinued when she developed massive PE       02/16/2016 Imaging    Massive bilateral occlusive pulmonary emboli. CT evidence of right heart strain (RV/LV Ratio = 1.46) consistent with at least submassive (intermediate risk) PE. The presence of right heart strain has been associated with an increased risk of morbidity and mortality. 2. Large layering RIGHT pleural effusion. Concern  for malignant effusion. 3. New large mass in the gastrohepatic ligament (6 cm) concerning for progression of ovarian carcinoma.      02/17/2016 - 02/21/2016 Hospital Admission    She was admitted to the hospital and was found to have DVT and severe PE with heart strain. She was discharged home on Lovenox      02/18/2016 Imaging    Normal left ventricular size and systolic function. Severely dilated right ventricle with moderately decreased systolic function and severe pulmonary hypertension suspicious for pulmonary embolism.      02/18/2016 Imaging    Findings consistent with acute deep vein thrombosis involving the popliteal and distal femoral veins of the left lower extremity.       02/25/2016 Tumor Marker    Patient's tumor was tested for the following markers: CA125 Results of the tumor marker test revealed 89.5       INTERVAL HISTORY: Please see below for problem oriented charting. She returns prior to  cycle 3 of chemotherapy. Since blood transfusion recently, her symptoms of fatigue has improved. The patient denies any recent signs or symptoms of bleeding such as spontaneous epistaxis, hematuria or hematochezia. She complained of intermittent chest discomfort especially around the right shoulder. Her appetite is stable.  She denies recent weight change.  She had mild intermittent constipation. Previous chemotherapy induced peripheral neuropathy is stable  REVIEW OF SYSTEMS:   Constitutional: Denies fevers, chills or abnormal weight loss Eyes: Denies blurriness of vision Ears, nose, mouth, throat, and face: Denies mucositis or sore throat Respiratory: Denies cough, dyspnea or wheezes Gastrointestinal:  Denies nausea, heartburn or change in bowel habits Skin: Denies abnormal skin rashes Lymphatics: Denies new lymphadenopathy or easy bruising Neurological:Denies numbness, tingling or new weaknesses Behavioral/Psych: Mood is stable, no new changes  All other systems were  reviewed with the patient and are negative.  I have reviewed the past medical history, past surgical history, social history and family history with the patient and they are unchanged from previous note.  ALLERGIES:  is allergic to sulfa antibiotics.  MEDICATIONS:  Current Outpatient Prescriptions  Medication Sig Dispense Refill  . albuterol (PROVENTIL HFA;VENTOLIN HFA) 108 (90 Base) MCG/ACT inhaler Inhale 2 puffs into the lungs 2 (two) times daily as needed for wheezing or shortness of breath. 1 Inhaler 2  . budesonide-formoterol (SYMBICORT) 80-4.5 MCG/ACT inhaler Inhale 2 puffs into the lungs 2 (two) times daily. 1 Inhaler 0  . cetirizine (ZYRTEC) 5 MG tablet Take 5 mg by mouth daily.    Marland Kitchen enoxaparin (LOVENOX) 80 MG/0.8ML injection Inject 0.8 mLs (80 mg total) into the skin every 12 (twelve) hours. 60 Syringe 9  . famotidine (PEPCID) 20 MG tablet One at bedtime 30 tablet 2  . fluticasone (FLONASE) 50 MCG/ACT nasal spray Place 1 spray into both nostrils daily as needed. (Patient taking differently: Place 1 spray into both nostrils daily. ) 16 g 0  . HYDROcodone-homatropine (HYCODAN) 5-1.5 MG/5ML syrup Take 5 mLs by mouth every 6 (six) hours as needed for cough. 240 mL 0  . HYDROmorphone (DILAUDID) 2 MG tablet Take 1 tablet (2 mg total) by mouth every 4 (four) hours as needed for severe pain. 30 tablet 0  . lidocaine-prilocaine (EMLA) cream Apply 1-2 hours prior to [facility] access as directed. (Patient taking differently: Apply 1 application topically See admin instructions. Apply 1-2 hours prior to [facility] access as directed.) 30 g 1  . LORazepam (ATIVAN) 0.5 MG tablet Place 1 tablet under the tongue or swallow every 6 hrs as needed for nausea. Will make drowsy. 20 tablet 0  . omeprazole (PRILOSEC) 20 MG capsule Take 20 mg by mouth at bedtime.    . ondansetron (ZOFRAN) 8 MG tablet Take 1 tablet (8 mg total) by mouth every 8 (eight) hours as needed for nausea (Will not make drowsy). 30  tablet 2  . polyethylene glycol (MIRALAX / GLYCOLAX) packet Take 17 g by mouth daily.     . predniSONE (DELTASONE) 10 MG tablet Take  4 each am x 2 days,   2 each am x 2 days,  1 each am x 2 days and stop 14 tablet 0   No current facility-administered medications for this visit.    Facility-Administered Medications Ordered in Other Visits  Medication Dose Route Frequency Provider Last Rate Last Dose  . ferumoxytol (FERAHEME) 510 mg in sodium chloride 0.9 % 100 mL IVPB  510 mg Intravenous Once Lennis Marion Downer, MD  PHYSICAL EXAMINATION: ECOG PERFORMANCE STATUS: 1 - Symptomatic but completely ambulatory  Vitals:   04/04/16 1103  BP: 120/77  Pulse: (!) 110  Resp: 17  Temp: 97.6 F (36.4 C)   Filed Weights   04/04/16 1103  Weight: 179 lb 6.4 oz (81.4 kg)    GENERAL:alert, no distress and comfortable SKIN: skin color, texture, turgor are normal, no rashes or significant lesions EYES: normal, Conjunctiva are pink and non-injected, sclera clear OROPHARYNX:no exudate, no erythema and lips, buccal mucosa, and tongue normal  NECK: supple, thyroid normal size, non-tender, without nodularity LYMPH:  no palpable lymphadenopathy in the cervical, axillary or inguinal LUNGS: clear to auscultation and percussion with normal breathing effort HEART: regular rate & rhythm and no murmurs and no lower extremity edema ABDOMEN:abdomen soft, non-tender and normal bowel sounds Musculoskeletal:no cyanosis of digits and no clubbing  NEURO: alert & oriented x 3 with fluent speech, no focal motor/sensory deficits  LABORATORY DATA:  I have reviewed the data as listed    Component Value Date/Time   NA 138 04/04/2016 1002   K 4.3 04/04/2016 1002   CL 109 02/20/2016 0531   CO2 25 04/04/2016 1002   GLUCOSE 100 04/04/2016 1002   BUN 17.1 04/04/2016 1002   CREATININE 1.1 04/04/2016 1002   CALCIUM 10.0 04/04/2016 1002   PROT 7.4 04/04/2016 1002   ALBUMIN 3.6 04/04/2016 1002   AST 10 04/04/2016  1002   ALT 11 04/04/2016 1002   ALKPHOS 57 04/04/2016 1002   BILITOT 0.59 04/04/2016 1002   GFRNONAA 39 (L) 02/20/2016 0531   GFRAA 45 (L) 02/20/2016 0531    No results found for: SPEP, UPEP  Lab Results  Component Value Date   WBC 6.6 04/04/2016   NEUTROABS 3.0 04/04/2016   HGB 9.5 (L) 04/04/2016   HCT 28.1 (L) 04/04/2016   MCV 92.9 04/04/2016   PLT 277 04/04/2016      Chemistry      Component Value Date/Time   NA 138 04/04/2016 1002   K 4.3 04/04/2016 1002   CL 109 02/20/2016 0531   CO2 25 04/04/2016 1002   BUN 17.1 04/04/2016 1002   CREATININE 1.1 04/04/2016 1002      Component Value Date/Time   CALCIUM 10.0 04/04/2016 1002   ALKPHOS 57 04/04/2016 1002   AST 10 04/04/2016 1002   ALT 11 04/04/2016 1002   BILITOT 0.59 04/04/2016 1002       RADIOGRAPHIC STUDIES: I have personally reviewed the radiological images as listed and agreed with the findings in the report. Ct Abdomen Pelvis W Contrast  Result Date: 03/25/2016 CLINICAL DATA:  History of ovarian cancer.  Restaging. EXAM: CT ABDOMEN AND PELVIS WITH CONTRAST TECHNIQUE: Multidetector CT imaging of the abdomen and pelvis was performed using the standard protocol following bolus administration of intravenous contrast. CONTRAST:  128m ISOVUE-300 IOPAMIDOL (ISOVUE-300) INJECTION 61% COMPARISON:  None. FINDINGS: Lower chest: There is a moderate left pleural effusion identified this is increased in volume from previous exam. Hepatobiliary: No focal parenchymal liver abnormality. There are multiple masses study minutes surface of the liver. Pancreas: Unremarkable. No pancreatic ductal dilatation or surrounding inflammatory changes. Spleen: Normal in size without focal abnormality. Adrenals/Urinary Tract: Normal adrenal glands. There is mild bilateral renal cortical scarring. No mass or hydronephrosis. The urinary bladder is normal. Stomach/Bowel: The stomach appears normal. The small bowel loops are nondilated. No pathologic  dilatation of the colon. Vascular/Lymphatic: Normal appearance of the abdominal aorta. No retroperitoneal adenopathy. No pelvic or inguinal adenopathy.  Reproductive: Previous hysterectomy. Other: Multiple peritoneal masses are again noted within the abdomen and pelvis. The index lesion along the undersurface of the left lobe of liver measures 8.6 by 5.3 cm, image 26 of series 2. Previously 7.9 x 5.0 cm. Mass along the undersurface of the right lobe measures 8.4 by 6.3 cm, image 29 of series 2. Previously 8.8 x 7.4 cm. Right upper quadrant mass which extends into the falciform ligament measures 8.5 x 8.0 cm, image 36 of series 2. Left upper quadrant index nodule measures 2.5 cm, image 37 of series 2. Previously 2.7 cm. Ascites is identified within the abdomen and pelvis. The volume of ascites appears decreased compared with previous exam. The index lesion within the central pelvis measures 4.6 x 2.9 cm, image 70 of series 2. Previously 4.4 x 2.8 cm. Ventral abdominal wall hernia is again identified which contains a nonobstructed loop of small bowel. Musculoskeletal: No aggressive lytic or sclerotic bone lesions. Degenerative disc disease is identified at L4-5. IMPRESSION: 1. No acute findings within the abdomen or pelvis. 2. Large volume of peritoneal tumor is identified within the abdomen and pelvis. This is stable to slightly increased in size in the interval. 3. Decrease in volume of ascites 4. Left pleural effusion is slightly increased in volume from previous exam. Electronically Signed   By: Kerby Moors M.D.   On: 03/25/2016 19:29    ASSESSMENT & PLAN:  Ovarian cancer, bilateral ([facility]) Overall, recent CT scan shows stable disease. Plan would be to do 2 more cycles of treatment before repeat imaging study. I would proceed with cycle 3 of treatment without dose adjustment. Since she had echocardiogram before cycle 2 of chemotherapy, I do not plan to repeat echocardiogram again until after cycle 4. I  will order a repeat CT scan when I see her back next month  Acute saddle pulmonary embolism with acute cor pulmonale ([place]) She is improving since discharge from hospital and is not using oxygen She will continue Lovenox for now I would favor Lovenox as long as she can tolerate it Will monitor for signs of bleeding She has intermittent chest wall discomfort that could be related to her recent PE.  Atypical chest pain Her chest pain could be related to recent PE I gave her prescription pain medicine to take as needed We discussed narcotic refill policy I warned her about risk of constipation   Anemia due to antineoplastic chemotherapy She has received blood transfusion recently with symptomatic and provement of her dyspnea and fatigue.  Continue to monitor blood count carefully  Chemotherapy-induced peripheral neuropathy (HCC) She has no worsening peripheral neuropathy from prior chemotherapy.  We will observe only   Orders Placed This Encounter  Procedures  . Hold Tube, Blood Bank    Standing Status:   Future    Standing Expiration Date:   05/09/2017   All questions were answered. The patient knows to call the clinic with any problems, questions or concerns. No barriers to learning was detected. I spent 30 minutes counseling the patient face to face. The total time spent in the appointment was 40 minutes and more than 50% was on counseling and review of test results     Heath Lark, MD 04/04/2016 12:00 PM

## 2016-04-04 NOTE — Patient Instructions (Signed)
[facility] Discharge Instructions for Patients Receiving Chemotherapy  Today you received the following chemotherapy agents Doxyrubucin  To help prevent nausea and vomiting after your treatment, we encourage you to take your nausea medication   If you develop nausea and vomiting that is not controlled by your nausea medication, call the clinic.   BELOW ARE SYMPTOMS THAT SHOULD BE REPORTED IMMEDIATELY:  *FEVER GREATER THAN 100.5 F  *CHILLS WITH OR WITHOUT FEVER  NAUSEA AND VOMITING THAT IS NOT CONTROLLED WITH YOUR NAUSEA MEDICATION  *UNUSUAL SHORTNESS OF BREATH  *UNUSUAL BRUISING OR BLEEDING  TENDERNESS IN MOUTH AND THROAT WITH OR WITHOUT PRESENCE OF ULCERS  *URINARY PROBLEMS  *BOWEL PROBLEMS  UNUSUAL RASH Items with * indicate a potential emergency and should be followed up as soon as possible.  Feel free to call the clinic you have any questions or concerns. The clinic phone number is [phone].  Please show the [name] at check-in to the Emergency Department and triage nurse.

## 2016-04-04 NOTE — Assessment & Plan Note (Signed)
Her chest pain could be related to recent PE I gave her prescription pain medicine to take as needed We discussed narcotic refill policy I warned her about risk of constipation

## 2016-04-04 NOTE — Assessment & Plan Note (Signed)
Overall, recent CT scan shows stable disease. Plan would be to do 2 more cycles of treatment before repeat imaging study. I would proceed with cycle 3 of treatment without dose adjustment. Since she had echocardiogram before cycle 2 of chemotherapy, I do not plan to repeat echocardiogram again until after cycle 4. I will order a repeat CT scan when I see her back next month

## 2016-04-04 NOTE — Assessment & Plan Note (Signed)
She has received blood transfusion recently with symptomatic and provement of her dyspnea and fatigue.  Continue to monitor blood count carefully

## 2016-04-04 NOTE — Assessment & Plan Note (Signed)
She is improving since discharge from hospital and is not using oxygen She will continue Lovenox for now I would favor Lovenox as long as she can tolerate it Will monitor for signs of bleeding She has intermittent chest wall discomfort that could be related to her recent PE.

## 2016-04-05 LAB — CA 125: CANCER ANTIGEN (CA) 125: 71.2 U/mL — AB (ref 0.0–38.1)

## 2016-04-07 NOTE — Progress Notes (Signed)
Spoke with pt and notified of results per Dr. Wert. Pt verbalized understanding and denied any questions. 

## 2016-04-08 ENCOUNTER — Other Ambulatory Visit: Payer: Self-pay | Admitting: Hematology and Oncology

## 2016-04-08 ENCOUNTER — Ambulatory Visit: Payer: Self-pay | Admitting: Internal Medicine

## 2016-04-08 DIAGNOSIS — C569 Malignant neoplasm of unspecified ovary: Secondary | ICD-10-CM

## 2016-04-08 MED ORDER — LORAZEPAM 0.5 MG PO TABS: 0.5000 mg | ORAL_TABLET | Freq: Four times a day (QID) | ORAL | 0 refills | Status: AC | PRN

## 2016-04-10 ENCOUNTER — Other Ambulatory Visit: Payer: Self-pay | Admitting: Hematology and Oncology

## 2016-04-11 ENCOUNTER — Telehealth: Payer: Self-pay | Admitting: Hematology and Oncology

## 2016-04-11 NOTE — Telephone Encounter (Signed)
Faxed Disability paperwork to [employer] fax #[phone] °

## 2016-04-24 ENCOUNTER — Encounter: Payer: Self-pay | Admitting: Internal Medicine

## 2016-04-24 ENCOUNTER — Ambulatory Visit (INDEPENDENT_AMBULATORY_CARE_PROVIDER_SITE_OTHER)
Admission: RE | Admit: 2016-04-24 | Discharge: 2016-04-24 | Disposition: A | Discharge status: Home / Self Care | Payer: Managed Care, Other (non HMO) | Source: Ambulatory Visit | Attending: Internal Medicine | Admitting: Internal Medicine

## 2016-04-24 ENCOUNTER — Ambulatory Visit (INDEPENDENT_AMBULATORY_CARE_PROVIDER_SITE_OTHER): Payer: Managed Care, Other (non HMO) | Admitting: Internal Medicine

## 2016-04-24 VITALS — BP 114/70 | HR 106 | Ht 65.0 in | Wt 174.8 lb

## 2016-04-24 DIAGNOSIS — C561 Malignant neoplasm of right ovary: Secondary | ICD-10-CM

## 2016-04-24 DIAGNOSIS — J45991 Cough variant asthma: Secondary | ICD-10-CM | POA: Diagnosis not present

## 2016-04-24 DIAGNOSIS — C562 Malignant neoplasm of left ovary: Secondary | ICD-10-CM

## 2016-04-24 DIAGNOSIS — C563 Malignant neoplasm of bilateral ovaries: Secondary | ICD-10-CM

## 2016-04-24 MED ORDER — PREDNISONE 10 MG PO TABS: ORAL_TABLET | ORAL | 0 refills | Status: DC

## 2016-04-24 MED ORDER — OMEPRAZOLE 20 MG PO CPDR: DELAYED_RELEASE_CAPSULE | ORAL | Status: DC

## 2016-04-24 NOTE — Progress Notes (Signed)
Subjective:     Patient ID: Audrey Porter, female   DOB: 1960/05/12,     MRN: [id number]  HPI  61 yowf never smoker first dx ovarian ca March 2016  referred to pulmonary clinic 03/26/2016 by Dr   Alvy Bimler for cough s/p PE  Admit date: 02/17/2016 Discharge date: 02/21/2016  Time spent: 35 minutes  Recommendations for Outpatient Follow-up:  1. Dr.Livesay 1/29 2. PCP Dr.Daniel in 1-2weeks    Discharge Diagnoses:  Principal Problem:   Acute saddle pulmonary embolism with acute cor pulmonale (HCC) with L DVT Active Problems:   Ovarian cancer, bilateral (HCC)   Environmental allergies   HTN (hypertension), benign   Anemia due to antineoplastic chemotherapy   CKD (chronic kidney disease) stage 3, GFR 30-59 ml/min   Discharge Condition: stable  Diet recommendation: heart healthy       Filed Weights   02/18/16 0702 02/20/16 0428 02/21/16 0420  Weight: 87.3 kg (192 lb 6.4 oz) 87.6 kg (193 lb 3.2 oz) 87.7 kg (193 lb 4.8 oz)    History of present illness:  Audrey Soyars Mead[place]sis a 56 y.o.femalewith a past medical history significant for metastatic ovarian CAwho admitted with Extensive PE with RV Strain and R heart dilation.  Hospital Course:  Massive BilateralPE and acute DVT -CT scan 02/17/16  showed massive B/LPE, ECHO with severely dilated Rv and moderately decreased RV fucntion - Venous doppler 02/18/16 c/w L DVT -Seen by PCCM this admission, no thrombolytics recommended -she was started on IV heparin and then once stabilized transitioned to SQ lovenox-due to concomittant malignancy - lovenox teaching completed -hemodynamics improved, weaned off O2, ambulating more at discharge -advised quick FU with Dr.Livesay  Ovarian Ca: -followedby Dr. Marko Plume. Currently on Doxil and bevacizumab  History of hypertension: -Hyzaar on hold, consider resuming at FU depending on BP trend  Anemia due to chemo, cancer -stable  CKD III : Baseline Cr 1.1-1.4,  stable.    03/26/2016 1st [facility] office visit/ Audrey Porter   Chief Complaint  Patient presents with  . Pulm Consult    Chronic cough for the past 2 months. Non-productive cough. Denies chest pains but has a history of SOB due to clots in lungs.   pt remembers sinus problems came and went "all her life" esp in spring > fall stuffy nose but  no cough or asthma and then much worse 2012 assoc with cough > underwent sinus surgery by Dr Redmond Pulling and helped a lot around 2013  But since around before xmas 2017 abrupt  onset of cough with similar sinus symptoms > eval by Freddie Breech rx by albuterol seemed to help when she used it  and flonase / saline and codeine cough syrups help some as well but still with lingering cough x 2 m duration  Cough is persistent daily/ dry cough/ hacking / worse during day, not needing cough med noct but worse at hs then better as she sleeps Worse with voice use also / has not tried albuterol again but not when she did "respond" to it she was also given pred same date - 02/25/16 rec Prilosec 20 x 2 = 40 mg   Take  30-60 min before first meal of the day and Pepcid  (famotidine)  20 mg one @  bedtime until return to office - this is the best way to tell whether stomach acid is contributing to your problem.   Prednisone 10 mg take  4 each am x 2 days,   2 each  am x 2 days,  1 each am x 2 days and stop  Start symbicort 80 Take 2 puffs first thing in am and then another 2 puffs about 12 hours later.  Work on inhaler technique:  relax and gently blow all the way out then take a nice smooth deep breath back in, triggering the inhaler at same time you start breathing in.  Hold for up to 5 seconds if you can. Blow out thru nose. Rinse and gargle with water when done GERD  Suppress cough x 3-5 days then try off all cough suppression  Will call call to schedule sinus CT  > negative Please schedule a follow up office visit in 2  weeks, sooner if needed  Add: cxr on return f/u R  effusion   04/24/2016  f/u ov/Audrey Porter re: cough since 2013  Chief Complaint  Patient presents with  . Follow-up    Cough is some better. She is having more nasal congestion. She has had not to use albuterol inhaler.     Cough 100% while on cough med but while on symb/ reflux meds the cough came back  back assoc with more nasal symptoms despite neg sinus ct  - no better on zyrtec / no better on saba      No obvious day to day or daytime variability or assoc excess/ purulent sputum or mucus plugs or hemoptysis or cp or chest tightness, subjective wheeze or overt sinus or hb symptoms. No unusual exp hx or h/o childhood pna/ asthma or knowledge of premature birth.  Sleeping ok p HS cough  settles down without nocturnal  or early am exacerbation  of respiratory  c/o's or need for noct saba. Also denies any obvious fluctuation of symptoms with weather or environmental changes or other aggravating or alleviating factors except as outlined above   Current Medications, Allergies, Complete Past Medical History, Past Surgical History, Family History, and Social History were reviewed in [employer] record.  ROS  The following are not active complaints unless bolded sore throat, dysphagia, dental problems, itching, sneezing,  nasal congestion or excess/ purulent secretions, ear ache,   fever, chills, sweats, unintended wt loss, classically pleuritic or exertional cp,  orthopnea pnd or leg swelling, presyncope, palpitations, abdominal pain when coughing, anorexia, nausea, vomiting, diarrhea  or change in bowel or bladder habits, change in stools or urine, dysuria,hematuria,  rash, arthralgias, visual complaints, headache, numbness, weakness or ataxia or problems with walking or coordination,  change in mood/affect or memory.            Objective:   Physical Exam   amb obese bf nad   04/24/2016      174   03/26/16 180 lb (81.6 kg)  03/26/16 180 lb 12.8 oz (82 kg)  03/07/16 185 lb 8  oz (84.1 kg)    Vital signs reviewed    - Note on arrival 02 sats  100% on RA    HEENT: nl dentition, turbinates bilaterally, and oropharynx. Nl external ear canals without cough reflex   NECK :  without JVD/Nodes/TM/ nl carotid upstrokes bilaterally   LUNGS: no acc muscle use,  Nl contour chest  With no cough on insp/exp   CV:  RRR  no s3 or murmur or increase in P2, and no edema   ABD:  soft and nontender with nl inspiratory excursion in the supine position. No bruits or organomegaly appreciated, bowel sounds nl  MS:  Nl gait/ ext warm without deformities,  calf tenderness, cyanosis or clubbing No obvious joint restrictions   SKIN: warm and dry without lesions    NEURO:  alert, approp, nl sensorium with  no motor or cerebellar deficits apparent.      I personally reviewed images and agree with radiology impression as follows:  CTa  Chest  02/17/16 1. Massive bilateral occlusive pulmonary emboli. CT evidence of right heart strain (RV/LV Ratio = 1.46) consistent with at least submassive (intermediate risk) PE. The presence of right heart strain has been associated with an increased risk of morbidity and mortality. Please activate Code PE by paging [phone]. 2. Large layering RIGHT pleural effusion. Concern for malignant effusion. 3. New large mass in the gastrohepatic ligament (6 cm) concerning for progression of ovarian carcinoma.     CXR PA and Lateral:   04/24/2016 :    I personally reviewed images and agree with radiology impression as follows:    Persistent small left pleural effusion layering posteriorly and laterally. No pulmonary parenchymal masses or nodules are observed. There is no pneumonia nor CHF.  Labs reviewed  Lab Results  Component Value Date   WBC 6.6 04/04/2016   HGB 9.5 (L) 04/04/2016   HCT 28.1 (L) 04/04/2016   MCV 92.9 04/04/2016   PLT 277 04/04/2016       EOS                       0.5                                                                    04/04/16     Assessment:

## 2016-04-24 NOTE — Patient Instructions (Addendum)
Best cough is medication = delsym 2 tsp every 12 hours as needed  For drainage / throat tickle try take CHLORPHENIRAMINE  4 mg - take one every 4 hours as needed - available over the counter- may cause drowsiness so start with just a bedtime dose or two and see how you tolerate it before trying in daytime    Prednisone 10 mg take  4 each am x 2 days,   2 each am x 2 days,  1 each am x 2 days and stop   Please remember to go to the  x-ray department downstairs in the basement  for your tests - we will call you with the results when they are available.     Please schedule a follow up office visit in 4 weeks, sooner if needed  with all medications /inhalers/ solutions in hand so we can verify exactly what you are taking. This includes all medications from all doctors and over the counters

## 2016-04-24 NOTE — Progress Notes (Signed)
Spoke with pt and notified of results per Dr. Wert. Pt verbalized understanding and denied any questions. 

## 2016-04-25 NOTE — Assessment & Plan Note (Addendum)
03/26/2016  After extensive coaching HFA effectiveness =    75% > try symbicort 80 2bid  - Sinus CT 04/04/2016 > Unremarkable maxillofacial CT without contrast. Clear paranasal sinuses and mastoid air cells. - 03/26/16  Cough only better while on heavy suppression, not better on symb 80/gerd rx so added 1st gen H1 for c/o's rhinitis/pnds  Cough for 5 years only better on heavy cough suppression is most likely not asthma but Upper airway cough syndrome (previously labeled PNDS) , is  so named because it's frequently impossible to sort out how much is  CR/sinusitis with freq throat clearing (which can be related to primary GERD)   vs  causing  secondary (" extra esophageal")  GERD from wide swings in gastric pressure that occur with throat clearing, often  promoting self use of mint and menthol lozenges that reduce the lower esophageal sphincter tone and exacerbate the problem further in a cyclical fashion.   These are the same pts (now being labeled as having "irritable larynx syndrome" by some cough centers) who not infrequently have a history of having failed to tolerate ace inhibitors,  dry powder inhalers or biphosphonates or report having atypical/extraesophageal reflux symptoms that don't respond to standard doses of PPI  and are easily confused as having aecopd or asthma flares by even experienced allergists/ pulmonologists (myself included)  Next step is try h1 plus gerd rx with just short term prednisone s symbicort (as even low dose ics can trigger uacs) and if no response p confirm she's taking meds correctly then next stop is MCT and trial of gabapentin   I had an extended discussion with the patient reviewing all relevant studies completed to date and  lasting 15 to 20 minutes of a 25 minute visit    Each maintenance medication was reviewed in detail including most importantly the difference between maintenance and prns and under what circumstances the prns are to be triggered using an action  plan format that is not reflected in the computer generated alphabetically organized AVS.    Please see AVS for specific instructions unique to this visit that I personally wrote and verbalized to the the pt in detail and then reviewed with pt  by my nurse highlighting any  changes in therapy recommended at today's visit to their plan of care.  Marland Kitchen

## 2016-04-25 NOTE — Assessment & Plan Note (Signed)
There is a small R effusion that is not likely the cause of the cough but could represent or be complication of ovarian ca but it has not changed  - if does can arrange IR thoracentesis.

## 2016-05-01 ENCOUNTER — Telehealth: Payer: Self-pay | Admitting: Internal Medicine

## 2016-05-01 ENCOUNTER — Other Ambulatory Visit: Payer: Self-pay | Admitting: Hematology and Oncology

## 2016-05-01 NOTE — Telephone Encounter (Signed)
Spoke with [facility], who states Rx for prednisone had been to [facility] on 3/29. [facility] states [facility] does not carry prednisone, therefore this [facility]. I explained to them that this rx was sent to [facility] by mistake, that pt has pick this Rx from local pharmacy. Rx has been d/c with [facility]. Nothing further needed.

## 2016-05-02 ENCOUNTER — Other Ambulatory Visit (HOSPITAL_BASED_OUTPATIENT_CLINIC_OR_DEPARTMENT_OTHER): Payer: Managed Care, Other (non HMO)

## 2016-05-02 ENCOUNTER — Encounter: Payer: Self-pay | Admitting: Hematology and Oncology

## 2016-05-02 ENCOUNTER — Ambulatory Visit (HOSPITAL_COMMUNITY)
Admission: RE | Admit: 2016-05-02 | Discharge: 2016-05-02 | Disposition: A | Discharge status: Home / Self Care | Payer: Managed Care, Other (non HMO) | Source: Ambulatory Visit | Attending: Hematology and Oncology | Admitting: Hematology and Oncology

## 2016-05-02 ENCOUNTER — Ambulatory Visit (HOSPITAL_BASED_OUTPATIENT_CLINIC_OR_DEPARTMENT_OTHER): Payer: Managed Care, Other (non HMO)

## 2016-05-02 ENCOUNTER — Encounter: Payer: Managed Care, Other (non HMO) | Admitting: Nutrition

## 2016-05-02 ENCOUNTER — Ambulatory Visit: Payer: Managed Care, Other (non HMO)

## 2016-05-02 ENCOUNTER — Ambulatory Visit (HOSPITAL_COMMUNITY): Payer: Managed Care, Other (non HMO)

## 2016-05-02 ENCOUNTER — Telehealth: Payer: Self-pay | Admitting: Hematology and Oncology

## 2016-05-02 ENCOUNTER — Ambulatory Visit (HOSPITAL_BASED_OUTPATIENT_CLINIC_OR_DEPARTMENT_OTHER): Payer: Managed Care, Other (non HMO) | Admitting: Hematology and Oncology

## 2016-05-02 VITALS — BP 131/76 | HR 106 | Temp 99.0°F | Resp 20 | Ht 65.0 in | Wt 174.2 lb

## 2016-05-02 VITALS — BP 116/72 | HR 90 | Temp 98.2°F | Resp 16

## 2016-05-02 DIAGNOSIS — C563 Malignant neoplasm of bilateral ovaries: Secondary | ICD-10-CM

## 2016-05-02 DIAGNOSIS — C562 Malignant neoplasm of left ovary: Principal | ICD-10-CM

## 2016-05-02 DIAGNOSIS — D6481 Anemia due to antineoplastic chemotherapy: Secondary | ICD-10-CM

## 2016-05-02 DIAGNOSIS — T451X5A Adverse effect of antineoplastic and immunosuppressive drugs, initial encounter: Secondary | ICD-10-CM

## 2016-05-02 DIAGNOSIS — C561 Malignant neoplasm of right ovary: Secondary | ICD-10-CM

## 2016-05-02 DIAGNOSIS — D6489 Other specified anemias: Secondary | ICD-10-CM | POA: Diagnosis present

## 2016-05-02 DIAGNOSIS — C569 Malignant neoplasm of unspecified ovary: Secondary | ICD-10-CM

## 2016-05-02 DIAGNOSIS — I2602 Saddle embolus of pulmonary artery with acute cor pulmonale: Secondary | ICD-10-CM | POA: Diagnosis not present

## 2016-05-02 DIAGNOSIS — Z5111 Encounter for antineoplastic chemotherapy: Secondary | ICD-10-CM | POA: Diagnosis not present

## 2016-05-02 DIAGNOSIS — Z95828 Presence of other vascular implants and grafts: Secondary | ICD-10-CM

## 2016-05-02 LAB — COMPREHENSIVE METABOLIC PANEL
ALBUMIN: 3.4 g/dL — AB (ref 3.5–5.0)
ALK PHOS: 56 U/L (ref 40–150)
ALT: 13 U/L (ref 0–55)
AST: 10 U/L (ref 5–34)
Anion Gap: 12 mEq/L — ABNORMAL HIGH (ref 3–11)
BUN: 18.8 mg/dL (ref 7.0–26.0)
CALCIUM: 10.2 mg/dL (ref 8.4–10.4)
CO2: 23 mEq/L (ref 22–29)
CREATININE: 1.1 mg/dL (ref 0.6–1.1)
Chloride: 104 mEq/L (ref 98–109)
EGFR: 68 mL/min/{1.73_m2} — ABNORMAL LOW (ref >90)
GLUCOSE: 97 mg/dL (ref 70–140)
POTASSIUM: 4.2 meq/L (ref 3.5–5.1)
SODIUM: 139 meq/L (ref 136–145)
Total Bilirubin: 0.47 mg/dL (ref 0.20–1.20)
Total Protein: 7.5 g/dL (ref 6.4–8.3)

## 2016-05-02 LAB — CBC WITH DIFFERENTIAL/PLATELET
BASO%: 0.8 % (ref 0.0–2.0)
Basophils Absolute: 0 10*3/uL (ref 0.0–0.1)
EOS%: 0.6 % (ref 0.0–7.0)
Eosinophils Absolute: 0 10*3/uL (ref 0.0–0.5)
HEMATOCRIT: 24.4 % — AB (ref 34.8–46.6)
HEMOGLOBIN: 8.2 g/dL — AB (ref 11.6–15.9)
LYMPH#: 1.6 10*3/uL (ref 0.9–3.3)
LYMPH%: 27.1 % (ref 14.0–49.7)
MCH: 31.8 pg (ref 25.1–34.0)
MCHC: 33.7 g/dL (ref 31.5–36.0)
MCV: 94.2 fL (ref 79.5–101.0)
MONO#: 1 10*3/uL — ABNORMAL HIGH (ref 0.1–0.9)
MONO%: 16.4 % — AB (ref 0.0–14.0)
NEUT%: 55.1 % (ref 38.4–76.8)
NEUTROS ABS: 3.3 10*3/uL (ref 1.5–6.5)
Platelets: 322 10*3/uL (ref 145–400)
RBC: 2.59 10*6/uL — ABNORMAL LOW (ref 3.70–5.45)
RDW: 18.5 % — AB (ref 11.2–14.5)
WBC: 6 10*3/uL (ref 3.9–10.3)

## 2016-05-02 LAB — PREPARE RBC (CROSSMATCH)

## 2016-05-02 MED ORDER — HEPARIN SOD (PORK) LOCK FLUSH 100 UNIT/ML IV SOLN
500.0000 [IU] | Freq: Once | INTRAVENOUS | Status: AC | PRN
  Administered 2016-05-02: 500 [IU]
  Filled 2016-05-02: qty 5

## 2016-05-02 MED ORDER — DIPHENHYDRAMINE HCL 25 MG PO CAPS
ORAL_CAPSULE | ORAL | Status: AC
  Filled 2016-05-02: qty 1

## 2016-05-02 MED ORDER — DOXORUBICIN HCL LIPOSOMAL CHEMO INJECTION 2 MG/ML
39.0000 mg/m2 | Freq: Once | INTRAVENOUS | Status: AC
  Administered 2016-05-02: 80 mg via INTRAVENOUS
  Filled 2016-05-02: qty 40

## 2016-05-02 MED ORDER — ACETAMINOPHEN 325 MG PO TABS
650.0000 mg | ORAL_TABLET | Freq: Once | ORAL | Status: AC
  Administered 2016-05-02: 650 mg via ORAL

## 2016-05-02 MED ORDER — DEXTROSE 5 % IV SOLN
Freq: Once | INTRAVENOUS | Status: AC
  Administered 2016-05-02: 13:00:00 via INTRAVENOUS

## 2016-05-02 MED ORDER — ONDANSETRON HCL 4 MG/2ML IJ SOLN
INTRAMUSCULAR | Status: AC
  Filled 2016-05-02: qty 4

## 2016-05-02 MED ORDER — LORAZEPAM 1 MG PO TABS
ORAL_TABLET | ORAL | Status: AC
  Filled 2016-05-02: qty 1

## 2016-05-02 MED ORDER — ACETAMINOPHEN 325 MG PO TABS
ORAL_TABLET | ORAL | Status: AC
  Filled 2016-05-02: qty 2

## 2016-05-02 MED ORDER — DIPHENHYDRAMINE HCL 25 MG PO CAPS
25.0000 mg | ORAL_CAPSULE | Freq: Once | ORAL | Status: AC
  Administered 2016-05-02: 25 mg via ORAL

## 2016-05-02 MED ORDER — DEXAMETHASONE SODIUM PHOSPHATE 10 MG/ML IJ SOLN
10.0000 mg | Freq: Once | INTRAMUSCULAR | Status: AC
  Administered 2016-05-02: 10 mg via INTRAVENOUS

## 2016-05-02 MED ORDER — SODIUM CHLORIDE 0.9% FLUSH
10.0000 mL | Freq: Once | INTRAVENOUS | Status: AC
  Administered 2016-05-02: 10 mL
  Filled 2016-05-02: qty 10

## 2016-05-02 MED ORDER — LORAZEPAM 1 MG PO TABS
0.5000 mg | ORAL_TABLET | Freq: Once | ORAL | Status: AC | PRN
  Administered 2016-05-02: 0.5 mg via ORAL

## 2016-05-02 MED ORDER — SODIUM CHLORIDE 0.9 % IV SOLN
250.0000 mL | Freq: Once | INTRAVENOUS | Status: AC
  Administered 2016-05-02: 250 mL via INTRAVENOUS

## 2016-05-02 MED ORDER — ONDANSETRON HCL 4 MG/2ML IJ SOLN
8.0000 mg | Freq: Once | INTRAMUSCULAR | Status: AC
  Administered 2016-05-02: 8 mg via INTRAVENOUS

## 2016-05-02 MED ORDER — ONDANSETRON HCL 8 MG PO TABS
ORAL_TABLET | ORAL | Status: AC
  Filled 2016-05-02: qty 1

## 2016-05-02 MED ORDER — SODIUM CHLORIDE 0.9% FLUSH
10.0000 mL | INTRAVENOUS | Status: DC | PRN
  Administered 2016-05-02: 10 mL
  Filled 2016-05-02: qty 10

## 2016-05-02 MED ORDER — DEXAMETHASONE SODIUM PHOSPHATE 10 MG/ML IJ SOLN
INTRAMUSCULAR | Status: AC
  Filled 2016-05-02: qty 1

## 2016-05-02 NOTE — Assessment & Plan Note (Signed)
We discussed some of the risks, benefits, and alternatives of blood transfusions. The patient is symptomatic from anemia and the hemoglobin level is critically low.  Some of the side-effects to be expected including risks of transfusion reactions, chills, infection, syndrome of volume overload and risk of hospitalization from various reasons and the patient is willing to proceed and went ahead to sign consent today. I plan to give her a unit of blood today because she is symptomatic and will proceed with treatment as scheduled

## 2016-05-02 NOTE — Progress Notes (Signed)
[facility] [place] OFFICE PROGRESS NOTE  Patient Care Team: Caryl Bis, MD as PCP - General (Family Medicine)  SUMMARY OF ONCOLOGIC HISTORY:   Ovarian cancer, bilateral ([place])   03/16/2014 Imaging    Outside hospital CT scan of the abdomen and pelvis reviewed the pelvic mass measured 9.9 cm 20.1 cm 17.8 cm consistent with ovarian cancer. 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 Alycia Rossetti performed EXPLORATORY LAPAROTOMY, EXPLORATORY CELIOTOMY WITH OR WITHOUT BIOPSY(S) URETEROLYSIS, WITH OR WITHOUT REPOSITIONING OF URETER FOR RETROPERITONEAL FIBROSIS BILATERAL S&O W/OMENTECTOMY, TAH/RADICAL DISSECT FOR DEBULK; W/PELVIC LYMPHAD/LMTD PARA-AORTIC LYMPHADENECT         03/31/2014 Pathology Results    Diagnosis:  FSA, A:Ovary and fallopian tube, left, left salpingo-oophorectomy  Histologic type:Clear cell carcinoma (see comment)   Histologic grade:high grade/grade 3   Tumor site: (ovary/primary peritoneal)ovary   Tumor size: (greatest dimension)13 x 9.5 x 6.2 cm   Ovarian surface involvement:not identified   Status of capsule: (intact/ruptured)intact   Extent of involvement of other tissues/organs (select all that apply):  Right ovary  Left ovary  Lymphovascular space invasion:present   Regional lymph nodes (see other specimens): Total number involved:1 Total number examined:10 Size of largest metastasis:microscopic focus only, approximately 1 mm;  necrosis, fibrosis, and foreign body type giant cell reaction present within two additional lymph nodes, changes suggestive of treatment effect or regression   Additional pathologic  findings:none   AJCC Pathologic Stage: pT3a1(i) pN1 pMx FIGO (2014classification) Stage Grouping:IIIA1(i)  Note:This pathologic stage assessment is based on information available at the time of this report, and is subject to change pending clinical review and additional information.  B:Ovary and fallopian tube, right, right salpingo-oophorectomy  Histologic type:clear cell carcinoma (B8)  Histologic grade:high grade/grade 3   Tumor site: (ovary/primary peritoneal)ovary   Tumor size: (greatest dimension)11 x 9 x 4.5 cm   Ovarian surface involvement:not identified   Status of capsule: (intact/ruptured)ruptured   C:Uterus and cervix, hysterectomy  Cervix  - No tumor seen   Endometrium  - Atrophic endometrium   Myometrium  - Adenomyosis and subserosal endometriosis   D:Omentum, omentectomy - No tumor seen   E:Lymph nodes, right periaortic, regional dissection - Three lymph nodes, no tumor seen (0/3)   F:Lymph nodes, right pelvic, regional dissection - 1 out of 6 lymph nodes shows minute (approximately 2 mm) focus of metastatic adenocarcinoma (1/6) - Two additional lymph nodes show necrosis, fibrosis, and foreign body type giant cell reaction suggestive of treatment effect versus regression   G:Lymph node, left pelvic, regional dissection - One lymph node, no tumor seen (0/1)   COMMENT: Multiple immunostains are performed on block A12 to further evaluate the case for metastasis to the bilateral ovaries form a distant site and to evaluate for mucinous versus clear cell change. The tumor shows focal intracytoplasmic mucin on mucin stains, and immunostains shows the tumor to be cytokeratin 7 positive, cytokeratin 20 negative, CDX2 negative, PAX8 positive, and negative for chromogranin, synaptophysin, TTF1, p53, ER, and PR, and rare positive cells on CA125, with low p53 (approximately 5%). Napsin is  positive. RCC and CD10 are negative. PAS is positive and the positive staining does not digest with PASD.  These findings supporting a tumor of primary gynecologic origin. Overall, a clear cell carcinoma is favored. Seromucinous features cannot be excluded based on the presence of scattered, faint intracytoplasmic mucin noted on mucin stain and the morphology with focal signet ring cells. Although uncommon, this has been described, however, in clear cell carcinomas, therefore the above classification is favored       03/31/2014 Tumor Marker    Patient's tumor was tested for the following markers: CA125 Results of the tumor marker test revealed 41.3      05/11/2014 Tumor Marker    Patient's tumor was tested for the following markers: CA125 Results of the tumor marker test revealed 8      05/12/2014 - 09/01/2014 Chemotherapy    She received adjuvant chemotherapy with carbo/taxol for 6 cycles      05/12/2014 Genetic Testing    Genetics testing:OvaNext panel 05-12-14 identified a variant of uncertain significance, RAD51D, p.T103A      06/01/2014 Tumor Marker    Patient's tumor was tested for the following markers: CA125 Results of the tumor marker test revealed 10      06/27/2014 Tumor Marker    Patient's tumor was tested for the following markers: CA125 Results of the tumor marker test revealed 5      08/10/2014 Tumor Marker    .Patient's tumor was tested for the following markers: CA125 Results of the tumor marker test revealed 6      09/14/2014 Imaging    Segment of circumferential bowel wall narrowing at the junction of the sigmoid colon and rectum is concerning for a serosal metastasis with subsequent constriction of the bowel lumen. There is a moderate volume of stool throughout the colon suggesting some degree of early obstruction. There is stool in the rectum. 2. Status post hysterectomy and resection of the cystic ovarian masses. 3. Three nodules adherent to the transverse colon are  concerning for serosal metastasis. 4. Subcutaneous thickening at the umbilicus likely related to surgery.      09/14/2014 Tumor Marker    Patient's tumor was tested for the following markers: CA125 Results of the tumor marker test revealed 5      09/27/2014 PET scan    No abnormal hypermetabolism above normal colon at the rectosigmoid junction, at the site of suspected mass in narrowing on 09/14/2014. 2. Small soft tissue nodules along the distal transverse colon do not show abnormal hypermetabolism but may be too small for PET resolution.      10/05/2014 Procedure    She underwent sigmoid colon biopsy      10/05/2014 Pathology Results    Sigmoid colon biopsy revealed acute colitis, nonspecific      10/18/2014 Tumor Marker    Patient's tumor was tested for the following markers: CA125 Results of the tumor marker test revealed 5      01/08/2015 Tumor Marker    Patient's tumor was tested for the following markers: CA125 Results of the tumor marker test revealed 4      04/04/2015 Tumor Marker    Patient's tumor was tested for the following markers: CA125 Results of the tumor marker test revealed 6.9      07/12/2015 Tumor Marker    Patient's tumor was tested for the following markers: CA125 Results of the tumor marker test revealed 7.5      10/10/2015 Tumor Marker    Patient's tumor was tested for the following markers: CA125 Results of the tumor marker test revealed 11.4      11/05/2015  Tumor Marker    Patient's tumor was tested for the following markers: CA125 Results of the tumor marker test revealed 18.4      11/19/2015 Imaging    1. 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 2. New large subcapsular metastatic masses along LEFT and RIGHT hepatic lobes and positioned between the diaphragm and the RIGHT hepatic lobe. 3. Peritoneal metastasis with new nodules within the mesentery of the small bowel and colon. 4. Potential serosal  implant within the proximal sigmoid colon. No bowel obstruction 5. Small amount of ascites is new from prior. 6. Mild pelvic lymphadenopathy consistent with  metastatic disease      11/30/2015 Tumor Marker    Patient's tumor was tested for the following markers: CA125 Results of the tumor marker test revealed 32.4      12/04/2015 Procedure    Ultrasound and fluoroscopically guided right internal jugular single lumen power port catheter insertion. Tip in the SVC/RA junction. Catheter ready for use      12/06/2015 - 12/28/2015 Chemotherapy    She received 2 cycles of carbo/taxol, DC due to disease progression      12/21/2015 Tumor Marker    Patient's tumor was tested for the following markers: CA125 Results of the tumor marker test revealed 48.4      01/14/2016 Tumor Marker    Patient's tumor was tested for the following markers: CA125 Results of the tumor marker test revealed 74.6      01/16/2016 Imaging    Prominent interval progression of metastatic disease in the abdomen and pelvis. Large capsular lesions along the liver are associated with enlarging and new omental, mesenteric and peritoneal nodules and masses throughout the abdomen and pelvis. 2. Interval progression of intraperitoneal free fluid. 3. Interval development of new small left pleural effusion. 4. Lesion involving the sigmoid colon has progressed in the interval. No evidence for colonic obstruction at this time.      01/16/2016 Imaging    LV EF: 55% -   60%      01/25/2016 -  Chemotherapy    She received Avastin with Doxil. Avastin was discontinued when she developed massive PE       02/16/2016 Imaging    Massive bilateral occlusive pulmonary emboli. CT evidence of right heart strain (RV/LV Ratio = 1.46) consistent with at least submassive (intermediate risk) PE. The presence of right heart strain has been associated with an increased risk of morbidity and mortality. 2. Large layering RIGHT pleural effusion. Concern  for malignant effusion. 3. New large mass in the gastrohepatic ligament (6 cm) concerning for progression of ovarian carcinoma.      02/17/2016 - 02/21/2016 Hospital Admission    She was admitted to the hospital and was found to have DVT and severe PE with heart strain. She was discharged home on Lovenox      02/18/2016 Imaging    Normal left ventricular size and systolic function. Severely dilated right ventricle with moderately decreased systolic function and severe pulmonary hypertension suspicious for pulmonary embolism.      02/18/2016 Imaging    Findings consistent with acute deep vein thrombosis involving the popliteal and distal femoral veins of the left lower extremity.       02/25/2016 Tumor Marker    Patient's tumor was tested for the following markers: CA125 Results of the tumor marker test revealed 89.5      03/25/2016 Imaging    No acute findings within the abdomen or pelvis. 2.  Large volume of peritoneal tumor is identified within the abdomen and pelvis. This is stable to slightly increased in size in the interval. 3. Decrease in volume of ascites 4. Left pleural effusion is slightly increased in volume from previous exam.      03/28/2016 Adverse Reaction    She received 2 units of blood transfusion      04/04/2016 Tumor Marker    Patient's tumor was tested for the following markers: CA125 Results of the tumor marker test revealed 71.2       INTERVAL HISTORY: Please see below for problem oriented charting. She returns for further chemotherapy She feels fatigue and has extensive bruises due to anticoagulation treatment The patient denies any recent signs or symptoms of bleeding such as spontaneous epistaxis, hematuria or hematochezia. Her energy level is low She denies chest pain or shortness of breath No mucositis, nausea or vomiting  REVIEW OF SYSTEMS:   Constitutional: Denies fevers, chills or abnormal weight loss Eyes: Denies blurriness of vision Ears, nose,  mouth, throat, and face: Denies mucositis or sore throat Respiratory: Denies cough, dyspnea or wheezes Cardiovascular: Denies palpitation, chest discomfort or lower extremity swelling Gastrointestinal:  Denies nausea, heartburn or change in bowel habits Skin: Denies abnormal skin rashes Lymphatics: Denies new lymphadenopathy  Neurological:Denies numbness, tingling or new weaknesses Behavioral/Psych: Mood is stable, no new changes  All other systems were reviewed with the patient and are negative.  I have reviewed the past medical history, past surgical history, social history and family history with the patient and they are unchanged from previous note.  ALLERGIES:  is allergic to sulfa antibiotics.  MEDICATIONS:  Current Outpatient Prescriptions  Medication Sig Dispense Refill  . enoxaparin (LOVENOX) 80 MG/0.8ML injection Inject 0.8 mLs (80 mg total) into the skin every 12 (twelve) hours. 60 Syringe 9  . famotidine (PEPCID) 20 MG tablet One at bedtime 30 tablet 2  . fluticasone (FLONASE) 50 MCG/ACT nasal spray Place 1 spray into both nostrils daily as needed. (Patient taking differently: Place 1 spray into both nostrils daily. ) 16 g 0  . omeprazole (PRILOSEC) 20 MG capsule Take 2 tablets x 30-60 min before first meal of the day    . ondansetron (ZOFRAN) 8 MG tablet Take 1 tablet (8 mg total) by mouth every 8 (eight) hours as needed for nausea (Will not make drowsy). 30 tablet 2  . polyethylene glycol (MIRALAX / GLYCOLAX) packet Take 17 g by mouth daily as needed.     Marland Kitchen albuterol (PROVENTIL HFA;VENTOLIN HFA) 108 (90 Base) MCG/ACT inhaler Inhale 2 puffs into the lungs 2 (two) times daily as needed for wheezing or shortness of breath. (Patient not taking: Reported on 05/02/2016) 1 Inhaler 2  . HYDROcodone-homatropine (HYCODAN) 5-1.5 MG/5ML syrup TAKE 5 MLS BY MOUTH EVERY SIX HOURS AS NEEDED FOR COUGH  0  . HYDROmorphone (DILAUDID) 2 MG tablet Take 1 tablet (2 mg total) by mouth every 4 (four)  hours as needed for severe pain. (Patient not taking: Reported on 05/02/2016) 30 tablet 0  . lidocaine-prilocaine (EMLA) cream Apply 1-2 hours prior to [facility] access as directed. (Patient not taking: Reported on 05/02/2016) 30 g 1  . LORazepam (ATIVAN) 0.5 MG tablet Take 1 tablet (0.5 mg total) by mouth every 6 (six) hours as needed for anxiety. (Patient not taking: Reported on 05/02/2016) 60 tablet 0  . losartan-hydrochlorothiazide (HYZAAR) 100-12.5 MG tablet Take 1 tablet by mouth daily.  3   No current facility-administered medications for this visit.  Facility-Administered Medications Ordered in Other Visits  Medication Dose Route Frequency Provider Last Rate Last Dose  . ferumoxytol (FERAHEME) 510 mg in sodium chloride 0.9 % 100 mL IVPB  510 mg Intravenous Once [name] Livesay, MD      . sodium chloride flush (NS) 0.9 % injection 10 mL  10 mL Intracatheter PRN Heath Lark, MD   10 mL at 05/02/16 1649    PHYSICAL EXAMINATION: ECOG PERFORMANCE STATUS: 2 - Symptomatic, <50% confined to bed  Vitals:   05/02/16 1219  BP: 131/76  Pulse: (!) 106  Resp: 20  Temp: 99 F (37.2 C)   Filed Weights   05/02/16 1219  Weight: 174 lb 3.2 oz (79 kg)    GENERAL:alert, no distress and comfortable SKIN: skin color, texture, turgor are normal, no rashes or significant lesions.  Bruising is noted EYES: normal, Conjunctiva are pale and non-injected, sclera clear OROPHARYNX:no exudate, no erythema and lips, buccal mucosa, and tongue normal  NECK: supple, thyroid normal size, non-tender, without nodularity LYMPH:  no palpable lymphadenopathy in the cervical, axillary or inguinal LUNGS: clear to auscultation and percussion with normal breathing effort HEART: regular rate & rhythm and no murmurs and no lower extremity edema ABDOMEN:abdomen soft, non-tender and normal bowel sounds Musculoskeletal:no cyanosis of digits and no clubbing  NEURO: alert & oriented x 3 with fluent speech, no focal  motor/sensory deficits  LABORATORY DATA:  I have reviewed the data as listed    Component Value Date/Time   NA 139 05/02/2016 1139   K 4.2 05/02/2016 1139   CL 109 02/20/2016 0531   CO2 23 05/02/2016 1139   GLUCOSE 97 05/02/2016 1139   BUN 18.8 05/02/2016 1139   CREATININE 1.1 05/02/2016 1139   CALCIUM 10.2 05/02/2016 1139   PROT 7.5 05/02/2016 1139   ALBUMIN 3.4 (L) 05/02/2016 1139   AST 10 05/02/2016 1139   ALT 13 05/02/2016 1139   ALKPHOS 56 05/02/2016 1139   BILITOT 0.47 05/02/2016 1139   GFRNONAA 39 (L) 02/20/2016 0531   GFRAA 45 (L) 02/20/2016 0531    No results found for: SPEP, UPEP  Lab Results  Component Value Date   WBC 6.0 05/02/2016   NEUTROABS 3.3 05/02/2016   HGB 8.2 (L) 05/02/2016   HCT 24.4 (L) 05/02/2016   MCV 94.2 05/02/2016   PLT 322 05/02/2016      Chemistry      Component Value Date/Time   NA 139 05/02/2016 1139   K 4.2 05/02/2016 1139   CL 109 02/20/2016 0531   CO2 23 05/02/2016 1139   BUN 18.8 05/02/2016 1139   CREATININE 1.1 05/02/2016 1139      Component Value Date/Time   CALCIUM 10.2 05/02/2016 1139   ALKPHOS 56 05/02/2016 1139   AST 10 05/02/2016 1139   ALT 13 05/02/2016 1139   BILITOT 0.47 05/02/2016 1139       RADIOGRAPHIC STUDIES: I have personally reviewed the radiological images as listed and agreed with the findings in the report. Dg Chest 2 View  Result Date: 04/24/2016 CLINICAL DATA:  Chronic cough, history of hypertension, ovarian malignancy, previous pulmonary embolism EXAM: CHEST  2 VIEW COMPARISON:  Chest x-ray of February 17, 2016 FINDINGS: A small left pleural effusion persists. The lungs are otherwise clear. The heart and pulmonary vascularity are normal. The mediastinum is normal in width. The power port catheter tip projects over the midportion of the SVC. The bony thorax exhibits no acute abnormality. IMPRESSION: Persistent small left pleural effusion layering  posteriorly and laterally. No pulmonary parenchymal  masses or nodules are observed. There is no pneumonia nor CHF. Electronically Signed   By: David  Martinique M.D.   On: 04/24/2016 14:37   Ct Maxillofacial Wo Contrast  Result Date: 04/04/2016 CLINICAL DATA:  56 y/o F; chronic cough for 3-4 months. History of ovarian cancer with ongoing chemotherapy. EXAM: CT MAXILLOFACIAL WITHOUT CONTRAST TECHNIQUE: Multidetector CT imaging of the maxillofacial structures was performed. Multiplanar CT image reconstructions were also generated. A small metallic BB was placed on the right temple in order to reliably differentiate right from left. COMPARISON:  None. FINDINGS: Osseous: No fracture or mandibular dislocation. No destructive process. Cervical spondylosis with moderate discogenic degenerative changes partially visualized at the C5 through C7 levels. Orbits: Negative. No traumatic or inflammatory finding. Sinuses: Clear. Soft tissues: Negative. Limited intracranial: No significant or unexpected finding. IMPRESSION: Unremarkable maxillofacial CT without contrast. Clear paranasal sinuses and mastoid air cells. Electronically Signed   By: Kristine Garbe M.D.   On: 04/04/2016 16:43    ASSESSMENT & PLAN:  Ovarian cancer, bilateral ([facility]) Overall, her last CT scan shows stable disease. Plan to proceed with treatment today without dose adjustment. I recommend repeat CT scan and echocardiogram before I see her back next month  Anemia due to antineoplastic chemotherapy We discussed some of the risks, benefits, and alternatives of blood transfusions. The patient is symptomatic from anemia and the hemoglobin level is critically low.  Some of the side-effects to be expected including risks of transfusion reactions, chills, infection, syndrome of volume overload and risk of hospitalization from various reasons and the patient is willing to proceed and went ahead to sign consent today. I plan to give her a unit of blood today because she is symptomatic and will proceed  with treatment as scheduled  Acute saddle pulmonary embolism with acute cor pulmonale ([place]) She is improving since discharge from hospital and is not using oxygen She will continue Lovenox for now I would favor Lovenox as long as she can tolerate it Will monitor for signs of bleeding If the next CT scan show resolution of blood clot, I will transition her to oral anticoagulation therapy   Orders Placed This Encounter  Procedures  . CT ABDOMEN PELVIS W CONTRAST    Standing Status:   Future    Standing Expiration Date:   08/02/2017    Order Specific Question:   Reason for Exam (SYMPTOM  OR DIAGNOSIS REQUIRED)    Answer:   ovarian ca, assess response to Rx    Order Specific Question:   Is the patient pregnant?    Answer:   No    Order Specific Question:   Preferred imaging location?    Answer:   [facility]  . CT CHEST W CONTRAST    Standing Status:   Future    Standing Expiration Date:   08/02/2017    Order Specific Question:   Reason for Exam (SYMPTOM  OR DIAGNOSIS REQUIRED)    Answer:   ovarian ca, assess response to Rx    Order Specific Question:   Is the patient pregnant?    Answer:   No    Order Specific Question:   Preferred imaging location?    Answer:   [facility]  . ECHOCARDIOGRAM LIMITED    Standing Status:   Future    Standing Expiration Date:   08/02/2017    Order Specific Question:   Where should this test be performed    Answer:  [facility]    Order Specific Question:   Complete or Limited study?    Answer:   Limited    Order Specific Question:   Does the patient have a known history of hypersensitivity to Perflutren (Chief Technology Officer for echocardiograms - CHECK ALLERGIES)    Answer:   No    Order Specific Question:   ADMINISTER PERFLUTERN    Answer:   ADMINISTER PERFLUTREN    Order Specific Question:   Expected Date:    Answer:   1 week    Comments:   3 weeks    Order Specific Question:   Reason for exam-Echo    Answer:    Chemotherapy evaluation  v87.41 / v58.11   All questions were answered. The patient knows to call the clinic with any problems, questions or concerns. No barriers to learning was detected. I spent 25 minutes counseling the patient face to face. The total time spent in the appointment was 30 minutes and more than 50% was on counseling and review of test results     Heath Lark, MD 05/02/2016 5:42 PM

## 2016-05-02 NOTE — Patient Instructions (Signed)
[facility] Discharge Instructions for Patients Receiving Chemotherapy  Today you received the following chemotherapy agents Doxyrubucin  To help prevent nausea and vomiting after your treatment, we encourage you to take your nausea medication   If you develop nausea and vomiting that is not controlled by your nausea medication, call the clinic.   BELOW ARE SYMPTOMS THAT SHOULD BE REPORTED IMMEDIATELY:  *FEVER GREATER THAN 100.5 F  *CHILLS WITH OR WITHOUT FEVER  NAUSEA AND VOMITING THAT IS NOT CONTROLLED WITH YOUR NAUSEA MEDICATION  *UNUSUAL SHORTNESS OF BREATH  *UNUSUAL BRUISING OR BLEEDING  TENDERNESS IN MOUTH AND THROAT WITH OR WITHOUT PRESENCE OF ULCERS  *URINARY PROBLEMS  *BOWEL PROBLEMS  UNUSUAL RASH Items with * indicate a potential emergency and should be followed up as soon as possible.  Feel free to call the clinic you have any questions or concerns. The clinic phone number is [phone].  Please show the [facility] at check-in to the Emergency Department and triage nurse.   Blood Transfusion , Adult A blood transfusion is a procedure in which you receive donated blood, including plasma, platelets, and red blood cells, through an IV tube. You may need a blood transfusion because of illness, surgery, or injury. The blood may come from a donor. You may also be able to donate blood for yourself (autologous blood donation) before a surgery if you know that you might require a blood transfusion. The blood given in a transfusion is made up of different types of cells. You may receive:  Red blood cells. These carry oxygen to the cells in the body.  White blood cells. These help you fight infections.  Platelets. These help your blood to clot.  Plasma. This is the liquid part of your blood and it helps with fluid imbalances. If you have hemophilia or another clotting disorder, you may also receive other types of blood products. Tell a health  care provider about:  Any allergies you have.  All medicines you are taking, including vitamins, herbs, eye drops, creams, and over-the-counter medicines.  Any problems you or family members have had with anesthetic medicines.  Any blood disorders you have.  Any surgeries you have had.  Any medical conditions you have, including any recent fever or cold symptoms.  Whether you are pregnant or may be pregnant.  Any previous reactions you have had during a blood transfusion. What are the risks? Generally, this is a safe procedure. However, problems may occur, including:  Having an allergic reaction to something in the donated blood. Hives and itching may be symptoms of this type of reaction.  Fever. This may be a reaction to the white blood cells in the transfused blood. Nausea or chest pain may accompany a fever.  Iron overload. This can happen from having many transfusions.  Transfusion-related acute lung injury (TRALI). This is a rare reaction that causes lung damage. The cause is not known.TRALI can occur within hours of a transfusion or several days later.  Sudden (acute) or delayed hemolytic reactions. This happens if your blood does not match the cells in your transfusion. Your body's defense system (immune system) may try to attack the new cells. This complication is rare. The symptoms include fever, chills, nausea, and low back pain or chest pain.  Infection or disease transmission. This is rare. What happens before the procedure?  You will have a blood test to determine your blood type. This is necessary to know what kind of blood your body will  accept and to match it to the donor blood.  If you are going to have a planned surgery, you may be able to do an autologous blood donation. This may be done in case you need to have a transfusion.  If you have had an allergic reaction to a transfusion in the past, you may be given medicine to help prevent a reaction. This medicine  may be given to you by mouth or through an IV tube.  You will have your temperature, blood pressure, and pulse monitored before the transfusion.  Follow instructions from your health care provider about eating and drinking restrictions.  Ask your health care provider about:  Changing or stopping your regular medicines. This is especially important if you are taking diabetes medicines or blood thinners.  Taking medicines such as aspirin and ibuprofen. These medicines can thin your blood. Do not take these medicines before your procedure if your health care provider instructs you not to. What happens during the procedure?  An IV tube will be inserted into one of your veins.  The bag of donated blood will be attached to your IV tube. The blood will then enter through your vein.  Your temperature, blood pressure, and pulse will be monitored regularly during the transfusion. This monitoring is done to detect early signs of a transfusion reaction.  If you have any signs or symptoms of a reaction, your transfusion will be stopped and you may be given medicine.  When the transfusion is complete, your IV tube will be removed.  Pressure may be applied to the IV site for a few minutes.  A bandage (dressing) will be applied. The procedure may vary among health care providers and hospitals. What happens after the procedure?  Your temperature, blood pressure, heart rate, breathing rate, and blood oxygen level will be monitored often.  Your blood may be tested to see how you are responding to the transfusion.  You may be warmed with fluids or blankets to maintain a normal body temperature. Summary  A blood transfusion is a procedure in which you receive donated blood, including plasma, platelets, and red blood cells, through an IV tube.  Your temperature, blood pressure, and pulse will be monitored before, during, and after the transfusion.  Your blood may be tested after the transfusion to  see how your body has responded. This information is not intended to replace advice given to you by your health care provider. Make sure you discuss any questions you have with your health care provider. Document Released: 01/11/2000 Document Revised: 10/11/2015 Document Reviewed: 10/11/2015 Elsevier Interactive Patient Education  2017 [employer].

## 2016-05-02 NOTE — Telephone Encounter (Signed)
Appointments scheduled per 4.6.18 LOS. Patient given AVS report and calendars with future scheduled appointments. °

## 2016-05-02 NOTE — Assessment & Plan Note (Signed)
She is improving since discharge from hospital and is not using oxygen She will continue Lovenox for now I would favor Lovenox as long as she can tolerate it Will monitor for signs of bleeding If the next CT scan show resolution of blood clot, I will transition her to oral anticoagulation therapy

## 2016-05-02 NOTE — Assessment & Plan Note (Signed)
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 Plan to proceed with treatment today without dose adjustment. I recommend repeat CT scan and echocardiogram before I see her back next month

## 2016-05-03 LAB — CA 125: CANCER ANTIGEN (CA) 125: 58.2 U/mL — AB (ref 0.0–38.1)

## 2016-05-06 LAB — BPAM RBC
BLOOD PRODUCT EXPIRATION DATE: 201804262359
BLOOD PRODUCT EXPIRATION DATE: 201805022359
ISSUE DATE / TIME: 201804061442
Unit Type and Rh: 7300
Unit Type and Rh: 7300

## 2016-05-06 LAB — TYPE AND SCREEN
ABO/RH(D): B POS
Antibody Screen: NEGATIVE
Unit division: 0
Unit division: 0

## 2016-05-16 ENCOUNTER — Telehealth: Payer: Self-pay | Admitting: *Deleted

## 2016-05-16 NOTE — Telephone Encounter (Signed)
Notified of Echo scheduled for Thursday, 4/26 @ 1000 at [facility]

## 2016-05-20 ENCOUNTER — Telehealth: Payer: Self-pay

## 2016-05-20 NOTE — Telephone Encounter (Signed)
No additional notes

## 2016-05-22 ENCOUNTER — Ambulatory Visit (HOSPITAL_COMMUNITY)
Admission: RE | Admit: 2016-05-22 | Discharge: 2016-05-22 | Disposition: A | Discharge status: Home / Self Care | Payer: Managed Care, Other (non HMO) | Source: Ambulatory Visit | Attending: Hematology and Oncology | Admitting: Hematology and Oncology

## 2016-05-22 DIAGNOSIS — C562 Malignant neoplasm of left ovary: Secondary | ICD-10-CM | POA: Diagnosis not present

## 2016-05-22 DIAGNOSIS — Z08 Encounter for follow-up examination after completed treatment for malignant neoplasm: Secondary | ICD-10-CM | POA: Diagnosis present

## 2016-05-22 DIAGNOSIS — J90 Pleural effusion, not elsewhere classified: Secondary | ICD-10-CM | POA: Diagnosis not present

## 2016-05-22 DIAGNOSIS — C561 Malignant neoplasm of right ovary: Secondary | ICD-10-CM | POA: Diagnosis not present

## 2016-05-22 DIAGNOSIS — C563 Malignant neoplasm of bilateral ovaries: Secondary | ICD-10-CM

## 2016-05-22 DIAGNOSIS — I2602 Saddle embolus of pulmonary artery with acute cor pulmonale: Secondary | ICD-10-CM | POA: Diagnosis not present

## 2016-05-22 LAB — ECHOCARDIOGRAM LIMITED
CHL CUP DOP CALC LVOT VTI: 16.4 cm
CHL CUP MV DEC (S): 198
EERAT: 7.34
EWDT: 198 ms
LV TDI E'MEDIAL: 8.06
LV e' LATERAL: 9.06 cm/s
LVEEAVG: 7.34
LVEEMED: 7.34
LVOT peak grad rest: 6 mmHg
LVOTPV: 121 cm/s
MV pk A vel: 106 m/s
MVPKEVEL: 66.5 m/s
TDI e' lateral: 9.06

## 2016-05-22 NOTE — Progress Notes (Signed)
  Echocardiogram 2D Echocardiogram has been performed.  Audrey Porter 05/22/2016, 10:52 AM

## 2016-05-23 ENCOUNTER — Encounter: Payer: Self-pay | Admitting: Adult Health

## 2016-05-28 ENCOUNTER — Encounter (HOSPITAL_COMMUNITY): Payer: Self-pay

## 2016-05-28 ENCOUNTER — Telehealth: Payer: Self-pay | Admitting: *Deleted

## 2016-05-28 ENCOUNTER — Encounter: Payer: Managed Care, Other (non HMO) | Admitting: Hematology and Oncology

## 2016-05-28 ENCOUNTER — Observation Stay (HOSPITAL_COMMUNITY)
Admission: AD | Admit: 2016-05-28 | Discharge: 2016-05-28 | Disposition: A | Discharge status: Home / Self Care | Payer: Managed Care, Other (non HMO) | Source: Ambulatory Visit | Attending: Hematology and Oncology | Admitting: Hematology and Oncology

## 2016-05-28 ENCOUNTER — Ambulatory Visit (HOSPITAL_COMMUNITY)
Admission: RE | Admit: 2016-05-28 | Discharge: 2016-05-28 | Disposition: A | Discharge status: Home / Self Care | Payer: Managed Care, Other (non HMO) | Source: Ambulatory Visit | Attending: Hematology and Oncology | Admitting: Hematology and Oncology

## 2016-05-28 ENCOUNTER — Ambulatory Visit: Payer: Managed Care, Other (non HMO)

## 2016-05-28 ENCOUNTER — Other Ambulatory Visit: Payer: Self-pay | Admitting: Hematology and Oncology

## 2016-05-28 ENCOUNTER — Other Ambulatory Visit (HOSPITAL_BASED_OUTPATIENT_CLINIC_OR_DEPARTMENT_OTHER): Payer: Managed Care, Other (non HMO)

## 2016-05-28 VITALS — BP 127/70 | HR 111 | Temp 99.9°F | Resp 21 | Ht 65.0 in | Wt 172.4 lb

## 2016-05-28 DIAGNOSIS — T451X5A Adverse effect of antineoplastic and immunosuppressive drugs, initial encounter: Secondary | ICD-10-CM | POA: Insufficient documentation

## 2016-05-28 DIAGNOSIS — C569 Malignant neoplasm of unspecified ovary: Secondary | ICD-10-CM | POA: Diagnosis not present

## 2016-05-28 DIAGNOSIS — D6481 Anemia due to antineoplastic chemotherapy: Secondary | ICD-10-CM

## 2016-05-28 DIAGNOSIS — C562 Malignant neoplasm of left ovary: Principal | ICD-10-CM

## 2016-05-28 DIAGNOSIS — I10 Essential (primary) hypertension: Secondary | ICD-10-CM | POA: Insufficient documentation

## 2016-05-28 DIAGNOSIS — Z95828 Presence of other vascular implants and grafts: Secondary | ICD-10-CM

## 2016-05-28 DIAGNOSIS — C561 Malignant neoplasm of right ovary: Secondary | ICD-10-CM | POA: Diagnosis not present

## 2016-05-28 DIAGNOSIS — R0789 Other chest pain: Secondary | ICD-10-CM | POA: Diagnosis not present

## 2016-05-28 DIAGNOSIS — D649 Anemia, unspecified: Secondary | ICD-10-CM

## 2016-05-28 DIAGNOSIS — I2602 Saddle embolus of pulmonary artery with acute cor pulmonale: Secondary | ICD-10-CM

## 2016-05-28 DIAGNOSIS — C563 Malignant neoplasm of bilateral ovaries: Secondary | ICD-10-CM

## 2016-05-28 DIAGNOSIS — Z79899 Other long term (current) drug therapy: Secondary | ICD-10-CM | POA: Diagnosis not present

## 2016-05-28 DIAGNOSIS — R42 Dizziness and giddiness: Secondary | ICD-10-CM | POA: Diagnosis present

## 2016-05-28 LAB — CBC WITH DIFFERENTIAL/PLATELET
BASO%: 0.2 % (ref 0.0–2.0)
Basophils Absolute: 0 10*3/uL (ref 0.0–0.1)
EOS%: 0.4 % (ref 0.0–7.0)
Eosinophils Absolute: 0 10*3/uL (ref 0.0–0.5)
HEMATOCRIT: 19 % — AB (ref 34.8–46.6)
HEMOGLOBIN: 6.2 g/dL — AB (ref 11.6–15.9)
LYMPH%: 27.8 % (ref 14.0–49.7)
MCH: 29.8 pg (ref 25.1–34.0)
MCHC: 32.6 g/dL (ref 31.5–36.0)
MCV: 91.3 fL (ref 79.5–101.0)
MONO#: 0.6 10*3/uL (ref 0.1–0.9)
MONO%: 12 % (ref 0.0–14.0)
NEUT#: 3.2 10*3/uL (ref 1.5–6.5)
NEUT%: 59.6 % (ref 38.4–76.8)
NRBC: 0 % (ref 0–0)
Platelets: 243 10*3/uL (ref 145–400)
RBC: 2.08 10*6/uL — ABNORMAL LOW (ref 3.70–5.45)
RDW: 17.5 % — AB (ref 11.2–14.5)
WBC: 5.3 10*3/uL (ref 3.9–10.3)
lymph#: 1.5 10*3/uL (ref 0.9–3.3)

## 2016-05-28 LAB — COMPREHENSIVE METABOLIC PANEL
ALBUMIN: 3.2 g/dL — AB (ref 3.5–5.0)
ALK PHOS: 66 U/L (ref 40–150)
ALT: 10 U/L (ref 0–55)
AST: 14 U/L (ref 5–34)
Anion Gap: 12 mEq/L — ABNORMAL HIGH (ref 3–11)
BUN: 17.9 mg/dL (ref 7.0–26.0)
CALCIUM: 9.9 mg/dL (ref 8.4–10.4)
CO2: 25 mEq/L (ref 22–29)
CREATININE: 1 mg/dL (ref 0.6–1.1)
Chloride: 101 mEq/L (ref 98–109)
EGFR: 73 mL/min/{1.73_m2} — ABNORMAL LOW (ref >90)
GLUCOSE: 101 mg/dL (ref 70–140)
Potassium: 4.4 mEq/L (ref 3.5–5.1)
Sodium: 138 mEq/L (ref 136–145)
Total Bilirubin: 0.43 mg/dL (ref 0.20–1.20)
Total Protein: 7.5 g/dL (ref 6.4–8.3)

## 2016-05-28 LAB — PREPARE RBC (CROSSMATCH)

## 2016-05-28 MED ORDER — HEPARIN SOD (PORK) LOCK FLUSH 100 UNIT/ML IV SOLN
250.0000 [IU] | INTRAVENOUS | Status: DC | PRN
  Filled 2016-05-28: qty 3

## 2016-05-28 MED ORDER — SODIUM CHLORIDE 0.9% FLUSH
10.0000 mL | Freq: Once | INTRAVENOUS | Status: AC
  Administered 2016-05-28: 10 mL
  Filled 2016-05-28: qty 10

## 2016-05-28 MED ORDER — HYDROMORPHONE HCL 2 MG PO TABS: 2.0000 mg | ORAL_TABLET | ORAL | Status: DC | PRN

## 2016-05-28 MED ORDER — ACETAMINOPHEN 325 MG PO TABS
650.0000 mg | ORAL_TABLET | Freq: Once | ORAL | Status: AC
  Administered 2016-05-28: 650 mg via ORAL
  Filled 2016-05-28: qty 2

## 2016-05-28 MED ORDER — HEPARIN SOD (PORK) LOCK FLUSH 100 UNIT/ML IV SOLN
INTRAVENOUS | Status: AC
  Administered 2016-05-28: 500 [IU]
  Filled 2016-05-28: qty 5

## 2016-05-28 MED ORDER — SODIUM CHLORIDE 0.9% FLUSH: 10.0000 mL | INTRAVENOUS | Status: DC | PRN

## 2016-05-28 MED ORDER — DIPHENHYDRAMINE HCL 25 MG PO CAPS
25.0000 mg | ORAL_CAPSULE | Freq: Once | ORAL | Status: AC
  Administered 2016-05-28: 25 mg via ORAL
  Filled 2016-05-28: qty 1

## 2016-05-28 MED ORDER — IOPAMIDOL (ISOVUE-300) INJECTION 61%
100.0000 mL | Freq: Once | INTRAVENOUS | Status: AC | PRN
  Administered 2016-05-28: 100 mL via INTRAVENOUS

## 2016-05-28 MED ORDER — ENOXAPARIN SODIUM 80 MG/0.8ML ~~LOC~~ SOLN: 80.0000 mg | Freq: Two times a day (BID) | SUBCUTANEOUS | Status: DC

## 2016-05-28 MED ORDER — HEPARIN SOD (PORK) LOCK FLUSH 100 UNIT/ML IV SOLN
500.0000 [IU] | Freq: Every day | INTRAVENOUS | Status: AC | PRN
  Administered 2016-05-28: 500 [IU]
  Filled 2016-05-28: qty 5

## 2016-05-28 MED ORDER — SODIUM CHLORIDE 0.9 % IV SOLN: 250.0000 mL | Freq: Once | INTRAVENOUS | Status: DC

## 2016-05-28 MED ORDER — ENSURE ENLIVE PO LIQD: 237.0000 mL | Freq: Two times a day (BID) | ORAL | Status: DC

## 2016-05-28 MED ORDER — SODIUM CHLORIDE 0.9% FLUSH: 3.0000 mL | INTRAVENOUS | Status: DC | PRN

## 2016-05-28 NOTE — Telephone Encounter (Signed)
Pt will come after CT for labs and see Dr Alvy Bimler

## 2016-05-28 NOTE — Progress Notes (Signed)
This encounter was created in error - please disregard.

## 2016-05-28 NOTE — Telephone Encounter (Signed)
She needs to get labs and then add her on to be seen at 130 pm

## 2016-05-28 NOTE — Patient Instructions (Signed)

## 2016-05-28 NOTE — Progress Notes (Signed)
Discharge instructions reviewed. Pt taken to car via wheelchair. Pt stable and  has no complaints at this time.

## 2016-05-28 NOTE — H&P (Signed)
Reform ADMISSION NOTE  Patient Care Team: Caryl Bis, MD as PCP - General (Family Medicine)  CHIEF COMPLAINTS/PURPOSE OF ADMISSION Severe anemia with chest pain  HISTORY OF PRESENTING ILLNESS:  Audrey Porter 56 y.o. female is admitted for urgent blood transfusion Summary of oncologic history as follows:   Ovarian cancer, bilateral ([place])   03/16/2014 Imaging    Outside hospital CT scan of the abdomen and pelvis reviewed the pelvic mass measured 9.9 cm 20.1 cm 17.8 cm consistent with ovarian cancer. 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 Alycia Rossetti performed EXPLORATORY LAPAROTOMY, EXPLORATORY CELIOTOMY WITH OR WITHOUT BIOPSY(S) URETEROLYSIS, WITH OR WITHOUT REPOSITIONING OF URETER FOR RETROPERITONEAL FIBROSIS BILATERAL S&O W/OMENTECTOMY, TAH/RADICAL DISSECT FOR DEBULK; W/PELVIC LYMPHAD/LMTD PARA-AORTIC LYMPHADENECT         03/31/2014 Pathology Results    Diagnosis:  FSA, A:Ovary and fallopian tube, left, left salpingo-oophorectomy  Histologic type:Clear cell carcinoma (see comment)   Histologic grade:high grade/grade 3   Tumor site: (ovary/primary peritoneal)ovary   Tumor size: (greatest dimension)13 x 9.5 x 6.2 cm   Ovarian surface involvement:not identified   Status of capsule: (intact/ruptured)intact   Extent of involvement of other tissues/organs (select all that apply):  Right ovary  Left ovary  Lymphovascular space invasion:present   Regional lymph nodes (see other specimens): Total number involved:1 Total number examined:10 Size of largest metastasis:microscopic focus only, approximately 1 mm;  necrosis,  fibrosis, and foreign body type giant cell reaction present within two additional lymph nodes, changes suggestive of treatment effect or regression   Additional pathologic findings:none   AJCC Pathologic Stage: pT3a1(i) pN1 pMx FIGO (2014classification) Stage Grouping:IIIA1(i)  Note:This pathologic stage assessment is based on information available at the time of this report, and is subject to change pending clinical review and additional information.  B:Ovary and fallopian tube, right, right salpingo-oophorectomy  Histologic type:clear cell carcinoma (B8)  Histologic grade:high grade/grade 3   Tumor site: (ovary/primary peritoneal)ovary   Tumor size: (greatest dimension)11 x 9 x 4.5 cm   Ovarian surface involvement:not identified   Status of capsule: (intact/ruptured)ruptured   C:Uterus and cervix, hysterectomy  Cervix  - No tumor seen   Endometrium  - Atrophic endometrium   Myometrium  - Adenomyosis and subserosal endometriosis   D:Omentum, omentectomy - No tumor seen   E:Lymph nodes, right periaortic, regional dissection - Three lymph nodes, no tumor seen (0/3)   F:Lymph nodes, right pelvic, regional dissection - 1 out of 6 lymph nodes shows minute (approximately 2 mm) focus of metastatic adenocarcinoma (1/6) - Two additional lymph nodes show necrosis, fibrosis, and foreign body type giant cell reaction suggestive of treatment effect versus regression   G:Lymph node, left pelvic, regional dissection - One lymph node, no tumor seen (0/1)   COMMENT: Multiple immunostains are performed on block A12 to further evaluate the case for metastasis to the bilateral ovaries form a distant site and to evaluate for mucinous versus clear cell change. The tumor shows focal intracytoplasmic mucin on mucin stains, and immunostains shows the tumor to be cytokeratin 7 positive, cytokeratin 20 negative,  CDX2 negative, PAX8 positive, and negative for chromogranin, synaptophysin, TTF1, p53, ER, and PR, and rare positive  cells on CA125, with low p53 (approximately 5%). Napsin is positive. RCC and CD10 are negative. PAS is positive and the positive staining does not digest with PASD. These findings supporting a tumor of primary gynecologic origin. Overall, a clear cell carcinoma is favored. Seromucinous features cannot be excluded based on the presence of scattered, faint intracytoplasmic mucin noted on mucin stain and the morphology with focal signet ring cells. Although uncommon, this has been described, however, in clear cell carcinomas, therefore the above classification is favored       03/31/2014 Tumor Marker    Patient's tumor was tested for the following markers: CA125 Results of the tumor marker test revealed 41.3      05/11/2014 Tumor Marker    Patient's tumor was tested for the following markers: CA125 Results of the tumor marker test revealed 8      05/12/2014 - 09/01/2014 Chemotherapy    She received adjuvant chemotherapy with carbo/taxol for 6 cycles      05/12/2014 Genetic Testing    Genetics testing:OvaNext panel 05-12-14 identified a variant of uncertain significance, RAD51D, p.T103A      06/01/2014 Tumor Marker    Patient's tumor was tested for the following markers: CA125 Results of the tumor marker test revealed 10      06/27/2014 Tumor Marker    Patient's tumor was tested for the following markers: CA125 Results of the tumor marker test revealed 5      08/10/2014 Tumor Marker    .Patient's tumor was tested for the following markers: CA125 Results of the tumor marker test revealed 6      09/14/2014 Imaging    Segment of circumferential bowel wall narrowing at the junction of the sigmoid colon and rectum is concerning for a serosal metastasis with subsequent constriction of the bowel lumen. There is a moderate volume of stool throughout the colon suggesting some degree of  early obstruction. There is stool in the rectum. 2. Status post hysterectomy and resection of the cystic ovarian masses. 3. Three nodules adherent to the transverse colon are concerning for serosal metastasis. 4. Subcutaneous thickening at the umbilicus likely related to surgery.      09/14/2014 Tumor Marker    Patient's tumor was tested for the following markers: CA125 Results of the tumor marker test revealed 5      09/27/2014 PET scan    No abnormal hypermetabolism above normal colon at the rectosigmoid junction, at the site of suspected mass in narrowing on 09/14/2014. 2. Small soft tissue nodules along the distal transverse colon do not show abnormal hypermetabolism but may be too small for PET resolution.      10/05/2014 Procedure    She underwent sigmoid colon biopsy      10/05/2014 Pathology Results    Sigmoid colon biopsy revealed acute colitis, nonspecific      10/18/2014 Tumor Marker    Patient's tumor was tested for the following markers: CA125 Results of the tumor marker test revealed 5      01/08/2015 Tumor Marker    Patient's tumor was tested for the following markers: CA125 Results of the tumor marker test revealed 4      04/04/2015 Tumor Marker    Patient's tumor was tested for the following markers: CA125 Results of the tumor marker test revealed 6.9      07/12/2015 Tumor Marker    Patient's tumor was tested for the following markers: CA125 Results of the tumor marker test revealed 7.5      10/10/2015  Tumor Marker    Patient's tumor was tested for the following markers: CA125 Results of the tumor marker test revealed 11.4      11/05/2015 Tumor Marker    Patient's tumor was tested for the following markers: CA125 Results of the tumor marker test revealed 18.4      11/19/2015 Imaging    1. 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 2. New large subcapsular metastatic masses along LEFT and RIGHT hepatic lobes and  positioned between the diaphragm and the RIGHT hepatic lobe. 3. Peritoneal metastasis with new nodules within the mesentery of the small bowel and colon. 4. Potential serosal implant within the proximal sigmoid colon. No bowel obstruction 5. Small amount of ascites is new from prior. 6. Mild pelvic lymphadenopathy consistent with  metastatic disease      11/30/2015 Tumor Marker    Patient's tumor was tested for the following markers: CA125 Results of the tumor marker test revealed 32.4      12/04/2015 Procedure    Ultrasound and fluoroscopically guided right internal jugular single lumen power port catheter insertion. Tip in the SVC/RA junction. Catheter ready for use      12/06/2015 - 12/28/2015 Chemotherapy    She received 2 cycles of carbo/taxol, DC due to disease progression      12/21/2015 Tumor Marker    Patient's tumor was tested for the following markers: CA125 Results of the tumor marker test revealed 48.4      01/14/2016 Tumor Marker    Patient's tumor was tested for the following markers: CA125 Results of the tumor marker test revealed 74.6      01/16/2016 Imaging    Prominent interval progression of metastatic disease in the abdomen and pelvis. Large capsular lesions along the liver are associated with enlarging and new omental, mesenteric and peritoneal nodules and masses throughout the abdomen and pelvis. 2. Interval progression of intraperitoneal free fluid. 3. Interval development of new small left pleural effusion. 4. Lesion involving the sigmoid colon has progressed in the interval. No evidence for colonic obstruction at this time.      01/16/2016 Imaging    LV EF: 55% -   60%      01/25/2016 - 05/02/2016 Chemotherapy    She received Avastin with Doxil. Avastin was discontinued when she developed massive PE       02/16/2016 Imaging    Massive bilateral occlusive pulmonary emboli. CT evidence of right heart strain (RV/LV Ratio = 1.46) consistent with at least  submassive (intermediate risk) PE. The presence of right heart strain has been associated with an increased risk of morbidity and mortality. 2. Large layering RIGHT pleural effusion. Concern for malignant effusion. 3. New large mass in the gastrohepatic ligament (6 cm) concerning for progression of ovarian carcinoma.      02/17/2016 - 02/21/2016 Hospital Admission    She was admitted to the hospital and was found to have DVT and severe PE with heart strain. She was discharged home on Lovenox      02/18/2016 Imaging    Normal left ventricular size and systolic function. Severely dilated right ventricle with moderately decreased systolic function and severe pulmonary hypertension suspicious for pulmonary embolism.      02/18/2016 Imaging    Findings consistent with acute deep vein thrombosis involving the popliteal and distal femoral veins of the left lower extremity.       02/25/2016 Tumor Marker    Patient's tumor was tested for the following markers:  CA125 Results of the tumor marker test revealed 89.5      03/25/2016 Imaging    No acute findings within the abdomen or pelvis. 2. Large volume of peritoneal tumor is identified within the abdomen and pelvis. This is stable to slightly increased in size in the interval. 3. Decrease in volume of ascites 4. Left pleural effusion is slightly increased in volume from previous exam.      03/28/2016 Adverse Reaction    She received 2 units of blood transfusion      04/04/2016 Tumor Marker    Patient's tumor was tested for the following markers: CA125 Results of the tumor marker test revealed 71.2      05/02/2016 Tumor Marker    Patient's tumor was tested for the following markers: CA125 Results of the tumor marker test revealed 58.2      05/22/2016 Imaging    Left ventricle: The cavity size was normal. Systolic function was normal. Wall motion was normal; there were no regional wall motion abnormalities. Doppler parameters are consistent with  abnormal left ventricular relaxation (grade 1 diastolic dysfunction).      05/28/2016 Imaging    CT: 1. Interval increased burden of bulky peritoneal carcinomatosis as detailed. Small volume malignant ascites is increased. 2. Stable mild right pelvic adenopathy. 3. Moderate dependent left pleural effusion is increased. 4. Near complete resolution of previously visualized pulmonary emboli as detailed . 5. Stable moderate umbilical hernia containing small bowel loops, with no bowel complication.      The patient requested to be seen urgently today She has some chest pain on exertion with shortness of breath She is mildly dizzy She just had CT imaging done today The patient denies any recent signs or symptoms of bleeding such as spontaneous epistaxis, hematuria or hematochezia. Denies recent cough, nausea or vomiting.  MEDICAL HISTORY:  Past Medical History:  Diagnosis Date  . Acid reflux   . Acute saddle pulmonary embolism with acute cor pulmonale (HCC)   . Anemia associated with chemotherapy   . Hypertension   . Ovarian ca [facility])     SURGICAL HISTORY: Past Surgical History:  Procedure Laterality Date  . ABDOMINAL HYSTERECTOMY  03/31/14   at [facility]  . CESAREAN SECTION  1990  . CHOLECYSTECTOMY  2000  . IR GENERIC HISTORICAL  12/04/2015   IR FLUORO GUIDE PORT INSERTION RIGHT 12/04/2015 Berdine Dance, MD [facility]  . IR GENERIC HISTORICAL  12/04/2015   IR US GUIDE VASC ACCESS RIGHT 12/04/2015 Berdine Dance, MD [facility]  . NASAL SINUS SURGERY      SOCIAL HISTORY: Social History   Social History  . Marital status: Married    Spouse name: N/A  . Number of children: 1  . Years of education: N/A   Occupational History  . Not on file.   Social History Main Topics  . Smoking status: Never Smoker  . Smokeless tobacco: Never Used  . Alcohol use No  . Drug use: No  . Sexual activity: No   Other Topics Concern  . Not on file   Social History Narrative  . No narrative on  file    FAMILY HISTORY: Family History  Problem Relation Age of Onset  . Cancer Brother 81    lung smoker  . Cancer Paternal Aunt 64    breast  . Cancer Maternal Grandmother 25    leukemia  . Cancer Paternal Grandmother     colorectal at unknown age    ALLERGIES:  is allergic  to sulfa antibiotics.  MEDICATIONS:  Current Facility-Administered Medications  Medication Dose Route Frequency Provider Last Rate Last Dose  . 0.9 %  sodium chloride infusion  250 mL Intravenous Once Heath Lark, MD      . acetaminophen (TYLENOL) tablet 650 mg  650 mg Oral Once Heath Lark, MD      . diphenhydrAMINE (BENADRYL) capsule 25 mg  25 mg Oral Once Heath Lark, MD      . heparin lock flush 100 unit/mL  500 Units Intracatheter Daily PRN Heath Lark, MD      . heparin lock flush 100 unit/mL  250 Units Intracatheter PRN Heath Lark, MD      . sodium chloride flush (NS) 0.9 % injection 10 mL  10 mL Intracatheter PRN Heath Lark, MD      . sodium chloride flush (NS) 0.9 % injection 3 mL  3 mL Intracatheter PRN Heath Lark, MD       Facility-Administered Medications Ordered in Other Encounters  Medication Dose Route Frequency Provider Last Rate Last Dose  . ferumoxytol (FERAHEME) 510 mg in sodium chloride 0.9 % 100 mL IVPB  510 mg Intravenous Once Lennis Marion Downer, MD        REVIEW OF SYSTEMS:   Constitutional: Denies fevers, chills or abnormal night sweats Eyes: Denies blurriness of vision, double vision or watery eyes Ears, nose, mouth, throat, and face: Denies mucositis or sore throat Gastrointestinal:  Denies nausea, heartburn or change in bowel habits Skin: Denies abnormal skin rashes Lymphatics: Denies new lymphadenopathy or easy bruising Neurological:Denies numbness, tingling or new weaknesses Behavioral/Psych: Mood is stable, no new changes  All other systems were reviewed with the patient and are negative.  PHYSICAL EXAMINATION: ECOG PERFORMANCE STATUS: 1 - Symptomatic but completely  ambulatory  Vitals:   05/28/16 1438  BP: (!) 128/92  Pulse: (!) 118  Resp: 20  Temp: 99.1 F (37.3 C)   There were no vitals filed for this visit.  GENERAL:alert, no distress and comfortable SKIN: skin color, texture, turgor are normal, no rashes or significant lesions EYES: normal, conjunctiva are pale and non-injected, sclera clear OROPHARYNX:no exudate, no erythema and lips, buccal mucosa, and tongue normal  NECK: supple, thyroid normal size, non-tender, without nodularity LYMPH:  no palpable lymphadenopathy in the cervical, axillary or inguinal LUNGS: Reduced breath sounds on the left lung base HEART: regular rate & rhythm and no murmurs and no lower extremity edema ABDOMEN:abdomen soft, non-tender and normal bowel sounds Musculoskeletal:no cyanosis of digits and no clubbing  PSYCH: alert & oriented x 3 with fluent speech NEURO: no focal motor/sensory deficits  LABORATORY DATA:  I have reviewed the data as listed Lab Results  Component Value Date   WBC 5.3 05/28/2016   HGB 6.2 (LL) 05/28/2016   HCT 19.0 (L) 05/28/2016   MCV 91.3 05/28/2016   PLT 243 05/28/2016    Recent Labs  02/18/16 1041 02/19/16 0514 02/20/16 0531  04/04/16 1002 05/02/16 1139 05/28/16 1320  NA 141 139 141  < > 138 139 138  K 4.1 3.9 3.6  < > 4.3 4.2 4.4  CL 109 110 109  --   --   --   --   CO2 21* 20* 23  < > _0 GLUCOSE 116* 94 100*  < > 100 97 101  BUN _1 < > 17.1 18.8 17.9  CREATININE 1.31* 1.27* 1.46*  < > 1.1 1.1 1.0  CALCIUM 8.7* 8.7* 9.0  < >  10.0 10.2 9.9  GFRNONAA 45* 46* 39*  --   --   --   --   GFRAA 52* 54* 45*  --   --   --   --   PROT  --   --   --   < > 7.4 7.5 7.5  ALBUMIN  --   --   --   < > 3.6 3.4* 3.2*  AST  --   --   --   < > _0 ALT  --   --   --   < > _1 ALKPHOS  --   --   --   < > 57 56 66  BILITOT  --   --   --   < > 0.59 0.47 0.43  < > = values in this interval not displayed.  RADIOGRAPHIC STUDIES: I have personally reviewed the  radiological images as listed and agreed with the findings in the report. Ct Chest W Contrast  Result Date: 05/28/2016 CLINICAL DATA:  Clear cell ovarian carcinoma diagnosed March 2016 at [facility] with omentectomy and lymph node dissection. Patient presents for restaging with ongoing chemotherapy. EXAM: CT CHEST, ABDOMEN, AND PELVIS WITH CONTRAST TECHNIQUE: Multidetector CT imaging of the chest, abdomen and pelvis was performed following the standard protocol during bolus administration of intravenous contrast. CONTRAST:  180m ISOVUE-300 IOPAMIDOL (ISOVUE-300) INJECTION 61% COMPARISON:  03/25/2016 CT abdomen/ pelvis. 02/17/2016 chest CT angiogram. FINDINGS: CT CHEST FINDINGS Cardiovascular: Normal heart size. No significant pericardial fluid/thickening. Right internal jugular MediPort terminates near the cavoatrial junction. Great vessels are normal in course and caliber. Previously visualized extensive central pulmonary emboli have largely resolved, with minimal residual eccentric chronic emboli in the right lower lobe lobar pulmonary artery branch. Mediastinum/Nodes: No discrete thyroid nodules. Unremarkable esophagus. No pathologically enlarged axillary, mediastinal or hilar lymph nodes. Stable top-normal 0.8 cm right pericardiophrenic node (series 2/image 40). Lungs/Pleura: No pneumothorax. No right pleural effusion. Moderate dependent left pleural effusion is slightly increased. Compressive atelectasis in the dependent left lower lobe. Otherwise no acute consolidative airspace disease, lung masses or significant pulmonary nodules in the aerated portions of the lungs. Musculoskeletal:  No aggressive appearing focal osseous lesions. CT ABDOMEN PELVIS FINDINGS Hepatobiliary: There is mass-effect on the liver capsule by multiple bulky peritoneal masses in the perihepatic space, with representative masses as follows: - along the posterior lateral segment left liver lobe a 9.0 x 7.6 cm mass (series 2/image 51),  previously 8.6 x 6.6 cm using similar measurement technique on 03/25/2016, increased - along the anterior liver a 5.0 x 2.3 cm mass (series 2/image 54), previously 4.7 x 1.7 cm, increased - along the posterior inferior right liver lobe an 8.4 x 6.9 cm mass (series 2/image 57), previously 8.2 x 6.3 cm, mildly increased -along the inferior liver a 10.5 x 9.1 cm mass (series 2/image 66), previously 9.8 x 7.1 cm, increased No liver parenchymal masses. Cholecystectomy . No biliary ductal dilatation. Pancreas: Normal, with no mass or duct dilation. Spleen: Normal size. No mass. Adrenals/Urinary Tract: Normal adrenals. No hydronephrosis. No renal mass. Normal bladder. Stomach/Bowel: Grossly normal stomach. Stable moderate umbilical hernia containing ascites and mid small bowel loops. Normal caliber small bowel with no small bowel wall thickening or pneumatosis. Oral contrast reaches the rectum. Normal appendix. Serosal 5.6 x 3.1 cm implant along the rectosigmoid junction (series 2/ image 96), previously 5.7 x 3.3 cm on 03/25/2016 using similar measurement technique, not appreciably changed. Otherwise unremarkable large bowel.  Vascular/Lymphatic: Normal caliber abdominal aorta. Patent portal, splenic, hepatic and renal veins. Mildly enlarged 1.1 cm right common iliac node (series 2/ image 86), stable. No additional pathologically enlarged abdominopelvic nodes. Reproductive: Status post hysterectomy. No discrete mass at the vaginal cuff. No discrete adnexal mass. Other: No pneumoperitoneum. Small volume ascites is increased. Soft tissue caking in the left upper quadrant fat is increased. Left lower quadrant 2.5 x 2.3 cm peritoneal implant (series 2/ image 93), previously 2.2 x 2.2 cm, slightly increased. Right lower quadrant 1.6 x 1.2 cm implant (series 2/image 90), previously 1.6 x 1.1 cm, not appreciably changed. Subcutaneous nodularity in the bilateral abdominal wall is nonspecific and presumably related to subcutaneous  injection of medications. Musculoskeletal: No aggressive appearing focal osseous lesions. Moderate lumbar spondylosis, most prominent at L5-S1. IMPRESSION: 1. Interval increased burden of bulky peritoneal carcinomatosis as detailed. Small volume malignant ascites is increased. 2. Stable mild right pelvic adenopathy. 3. Moderate dependent left pleural effusion is increased. 4. Near complete resolution of previously visualized pulmonary emboli as detailed . 5. Stable moderate umbilical hernia containing small bowel loops, with no bowel complication. Electronically Signed   By: Ilona Sorrel M.D.   On: 05/28/2016 14:41   Ct Abdomen Pelvis W Contrast  Result Date: 05/28/2016 CLINICAL DATA:  Clear cell ovarian carcinoma diagnosed March 2016 at [facility] with omentectomy and lymph node dissection. Patient presents for restaging with ongoing chemotherapy. EXAM: CT CHEST, ABDOMEN, AND PELVIS WITH CONTRAST TECHNIQUE: Multidetector CT imaging of the chest, abdomen and pelvis was performed following the standard protocol during bolus administration of intravenous contrast. CONTRAST:  152m ISOVUE-300 IOPAMIDOL (ISOVUE-300) INJECTION 61% COMPARISON:  03/25/2016 CT abdomen/ pelvis. 02/17/2016 chest CT angiogram. FINDINGS: CT CHEST FINDINGS Cardiovascular: Normal heart size. No significant pericardial fluid/thickening. Right internal jugular MediPort terminates near the cavoatrial junction. Great vessels are normal in course and caliber. Previously visualized extensive central pulmonary emboli have largely resolved, with minimal residual eccentric chronic emboli in the right lower lobe lobar pulmonary artery branch. Mediastinum/Nodes: No discrete thyroid nodules. Unremarkable esophagus. No pathologically enlarged axillary, mediastinal or hilar lymph nodes. Stable top-normal 0.8 cm right pericardiophrenic node (series 2/image 40). Lungs/Pleura: No pneumothorax. No right pleural effusion. Moderate dependent left pleural effusion is  slightly increased. Compressive atelectasis in the dependent left lower lobe. Otherwise no acute consolidative airspace disease, lung masses or significant pulmonary nodules in the aerated portions of the lungs. Musculoskeletal:  No aggressive appearing focal osseous lesions. CT ABDOMEN PELVIS FINDINGS Hepatobiliary: There is mass-effect on the liver capsule by multiple bulky peritoneal masses in the perihepatic space, with representative masses as follows: - along the posterior lateral segment left liver lobe a 9.0 x 7.6 cm mass (series 2/image 51), previously 8.6 x 6.6 cm using similar measurement technique on 03/25/2016, increased - along the anterior liver a 5.0 x 2.3 cm mass (series 2/image 54), previously 4.7 x 1.7 cm, increased - along the posterior inferior right liver lobe an 8.4 x 6.9 cm mass (series 2/image 57), previously 8.2 x 6.3 cm, mildly increased -along the inferior liver a 10.5 x 9.1 cm mass (series 2/image 66), previously 9.8 x 7.1 cm, increased No liver parenchymal masses. Cholecystectomy . No biliary ductal dilatation. Pancreas: Normal, with no mass or duct dilation. Spleen: Normal size. No mass. Adrenals/Urinary Tract: Normal adrenals. No hydronephrosis. No renal mass. Normal bladder. Stomach/Bowel: Grossly normal stomach. Stable moderate umbilical hernia containing ascites and mid small bowel loops. Normal caliber small bowel with no small bowel wall thickening or pneumatosis.  Oral contrast reaches the rectum. Normal appendix. Serosal 5.6 x 3.1 cm implant along the rectosigmoid junction (series 2/ image 96), previously 5.7 x 3.3 cm on 03/25/2016 using similar measurement technique, not appreciably changed. Otherwise unremarkable large bowel. Vascular/Lymphatic: Normal caliber abdominal aorta. Patent portal, splenic, hepatic and renal veins. Mildly enlarged 1.1 cm right common iliac node (series 2/ image 86), stable. No additional pathologically enlarged abdominopelvic nodes. Reproductive:  Status post hysterectomy. No discrete mass at the vaginal cuff. No discrete adnexal mass. Other: No pneumoperitoneum. Small volume ascites is increased. Soft tissue caking in the left upper quadrant fat is increased. Left lower quadrant 2.5 x 2.3 cm peritoneal implant (series 2/ image 93), previously 2.2 x 2.2 cm, slightly increased. Right lower quadrant 1.6 x 1.2 cm implant (series 2/image 90), previously 1.6 x 1.1 cm, not appreciably changed. Subcutaneous nodularity in the bilateral abdominal wall is nonspecific and presumably related to subcutaneous injection of medications. Musculoskeletal: No aggressive appearing focal osseous lesions. Moderate lumbar spondylosis, most prominent at L5-S1. IMPRESSION: 1. Interval increased burden of bulky peritoneal carcinomatosis as detailed. Small volume malignant ascites is increased. 2. Stable mild right pelvic adenopathy. 3. Moderate dependent left pleural effusion is increased. 4. Near complete resolution of previously visualized pulmonary emboli as detailed . 5. Stable moderate umbilical hernia containing small bowel loops, with no bowel complication. Electronically Signed   By: Ilona Sorrel M.D.   On: 05/28/2016 14:41    ASSESSMENT & PLAN:  Severe symptomatic anemia with chest pain, likely induced by ischemia The cause of anemia is likely due to side effects of chemotherapy Due to symptomatic chest pain, she is not safe to go home today without blood transfusion There is no way I can walk her in at the cancer center for blood transfusion distally of the day After much discussion, she is in agreement to be transferred to the observation unit to get 2 units of blood tonight She has appointment for follow-up in 2 days and we will repeat her blood work again and see if she needs further blood transfusion  Ovarian cancer At the time of dictation, CT results are available At the time when I saw the patient, I do not have the test results Overall, it appears that  the patient has slight disease progression She has not responded to last chemotherapy. I would discuss further treatment options with the patient and her husband on Friday  Discharge planning I plan to discharge her after blood transfusion is completed.     Heath Lark, MD 05/28/2016 2:48 PM

## 2016-05-28 NOTE — Telephone Encounter (Signed)
Patient called stating that she is having on/off right sided chest pain that has been occurring for a month but more frequently lately. Patient notices this pain in her lower back and sometime in middle and across shoulder. Patient denies pressure on chest,but more so a shooting pain. When breathing deeply she can feel this more. Tylenol helped a little, but not too much. Patient will be here around 1130 for scheduled CT scan, but wants to know if she can be seen by MD Alvy Bimler.

## 2016-05-28 NOTE — Telephone Encounter (Signed)
Message below

## 2016-05-29 LAB — TYPE AND SCREEN
ABO/RH(D): B POS
ANTIBODY SCREEN: NEGATIVE
UNIT DIVISION: 0
Unit division: 0

## 2016-05-29 LAB — BPAM RBC
Blood Product Expiration Date: 201805232359
Blood Product Expiration Date: 201805302359
ISSUE DATE / TIME: 201805021933
ISSUE DATE / TIME: 201805021645
UNIT TYPE AND RH: 7300
UNIT TYPE AND RH: 7300

## 2016-05-29 LAB — CA 125: Cancer Antigen (CA) 125: 44.5 U/mL — ABNORMAL HIGH (ref 0.0–38.1)

## 2016-05-29 NOTE — Discharge Summary (Signed)
Physician Discharge Summary  Patient ID: Audrey Porter MRN: [id number] DOB/AGE: 09/25/1960 56 y.o.  Admit date: 05/28/2016 Discharge date: 05/29/2016  Primary Care Physician:  Gar Ponto, MD   Discharge Diagnoses:    Present on Admission: . Ovarian cancer, bilateral ([place]) . Atypical chest pain . Anemia due to antineoplastic chemotherapy   Discharge Medications:  Allergies as of 05/28/2016      Reactions   Sulfa Antibiotics Hives      Medication List    TAKE these medications   acetaminophen 500 MG tablet Commonly known as:  TYLENOL Take 1,000 mg by mouth every 6 (six) hours as needed.   chlorpheniramine 4 MG tablet Commonly known as:  CHLOR-TRIMETON Take 4 mg by mouth 2 (two) times daily as needed for allergies.   enoxaparin 80 MG/0.8ML injection Commonly known as:  LOVENOX Inject 0.8 mLs (80 mg total) into the skin every 12 (twelve) hours.   famotidine 20 MG tablet Commonly known as:  PEPCID One at bedtime   fluticasone 50 MCG/ACT nasal spray Commonly known as:  FLONASE Place 1 spray into both nostrils daily as needed. What changed:  when to take this   HYDROcodone-homatropine 5-1.5 MG/5ML syrup Commonly known as:  HYCODAN TAKE 5 MLS BY MOUTH EVERY SIX HOURS AS NEEDED FOR COUGH   HYDROmorphone 2 MG tablet Commonly known as:  DILAUDID Take 1 tablet (2 mg total) by mouth every 4 (four) hours as needed for severe pain.   lidocaine-prilocaine cream Commonly known as:  EMLA Apply 1-2 hours prior to [facility] access as directed.   LORazepam 0.5 MG tablet Commonly known as:  ATIVAN Take 1 tablet (0.5 mg total) by mouth every 6 (six) hours as needed for anxiety.   omeprazole 20 MG capsule Commonly known as:  PRILOSEC Take 2 tablets x 30-60 min before first meal of the day   ondansetron 8 MG tablet Commonly known as:  ZOFRAN Take 1 tablet (8 mg total) by mouth every 8 (eight) hours as needed for nausea (Will not make drowsy).   polyethylene  glycol packet Commonly known as:  MIRALAX / GLYCOLAX Take 17 g by mouth daily as needed.       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 Patient presents for restaging with ongoing chemotherapy. EXAM: CT CHEST, ABDOMEN, AND PELVIS WITH CONTRAST TECHNIQUE: Multidetector CT imaging of the chest, abdomen and pelvis was performed following the standard protocol during bolus administration of intravenous contrast. CONTRAST:  135mL ISOVUE-300 IOPAMIDOL (ISOVUE-300) INJECTION 61% COMPARISON:  03/25/2016 CT abdomen/ pelvis. 02/17/2016 chest CT angiogram. FINDINGS: CT CHEST FINDINGS Cardiovascular: Normal heart size. No significant pericardial fluid/thickening. Right internal jugular MediPort terminates near the cavoatrial junction. Great vessels are normal in course and caliber. Previously visualized extensive central pulmonary emboli have largely resolved, with minimal residual eccentric chronic emboli in the right lower lobe lobar pulmonary artery branch. Mediastinum/Nodes: No discrete thyroid nodules. Unremarkable esophagus. No pathologically enlarged axillary, mediastinal or hilar lymph nodes. Stable top-normal 0.8 cm right pericardiophrenic node (series 2/image 40). Lungs/Pleura: No pneumothorax. No right pleural effusion. Moderate dependent left pleural effusion is slightly increased. Compressive atelectasis in the dependent left lower lobe. Otherwise no acute consolidative airspace disease, lung masses or significant pulmonary nodules in the aerated portions of the lungs. Musculoskeletal:  No aggressive appearing focal osseous lesions. CT ABDOMEN  PELVIS FINDINGS Hepatobiliary: There is mass-effect on the liver capsule by multiple bulky peritoneal masses in the perihepatic space, with representative masses as follows: - along  the posterior lateral segment left liver lobe a 9.0 x 7.6 cm mass (series 2/image 51), previously 8.6 x 6.6 cm using similar measurement technique on 03/25/2016, increased - along the anterior liver a 5.0 x 2.3 cm mass (series 2/image 54), previously 4.7 x 1.7 cm, increased - along the posterior inferior right liver lobe an 8.4 x 6.9 cm mass (series 2/image 57), previously 8.2 x 6.3 cm, mildly increased -along the inferior liver a 10.5 x 9.1 cm mass (series 2/image 66), previously 9.8 x 7.1 cm, increased No liver parenchymal masses. Cholecystectomy . No biliary ductal dilatation. Pancreas: Normal, with no mass or duct dilation. Spleen: Normal size. No mass. Adrenals/Urinary Tract: Normal adrenals. No hydronephrosis. No renal mass. Normal bladder. Stomach/Bowel: Grossly normal stomach. Stable moderate umbilical hernia containing ascites and mid small bowel loops. Normal caliber small bowel with no small bowel wall thickening or pneumatosis. Oral contrast reaches the rectum. Normal appendix. Serosal 5.6 x 3.1 cm implant along the rectosigmoid junction (series 2/ image 96), previously 5.7 x 3.3 cm on 03/25/2016 using similar measurement technique, not appreciably changed. Otherwise unremarkable large bowel. Vascular/Lymphatic: Normal caliber abdominal aorta. Patent portal, splenic, hepatic and renal veins. Mildly enlarged 1.1 cm right common iliac node (series 2/ image 86), stable. No additional pathologically enlarged abdominopelvic nodes. Reproductive: Status post hysterectomy. No discrete mass at the vaginal cuff. No discrete adnexal mass. Other: No pneumoperitoneum. Small volume ascites is increased. Soft tissue caking in the left upper quadrant fat is increased. Left lower quadrant 2.5 x 2.3 cm peritoneal implant (series 2/ image 93), previously 2.2 x 2.2 cm, slightly increased. Right lower quadrant 1.6 x 1.2 cm implant (series 2/image 90), previously 1.6 x 1.1 cm, not appreciably changed. Subcutaneous nodularity  in the bilateral abdominal wall is nonspecific and presumably related to subcutaneous injection of medications. Musculoskeletal: No aggressive appearing focal osseous lesions. Moderate lumbar spondylosis, most prominent at L5-S1. IMPRESSION: 1. Interval increased burden of bulky peritoneal carcinomatosis as detailed. Small volume malignant ascites is increased. 2. Stable mild right pelvic adenopathy. 3. Moderate dependent left pleural effusion is increased. 4. Near complete resolution of previously visualized pulmonary emboli as detailed . 5. Stable moderate umbilical hernia containing small bowel loops, with no bowel complication. Electronically Signed   By: Ilona Sorrel M.D.   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 Patient presents for restaging with ongoing chemotherapy. EXAM: CT CHEST, ABDOMEN, AND PELVIS WITH CONTRAST TECHNIQUE: Multidetector CT imaging of the chest, abdomen and pelvis was performed following the standard protocol during bolus administration of intravenous contrast. CONTRAST:  167mL ISOVUE-300 IOPAMIDOL (ISOVUE-300) INJECTION 61% COMPARISON:  03/25/2016 CT abdomen/ pelvis. 02/17/2016 chest CT angiogram. FINDINGS: CT CHEST FINDINGS Cardiovascular: Normal heart size. No significant pericardial fluid/thickening. Right internal jugular MediPort terminates near the cavoatrial junction. Great vessels are normal in course and caliber. Previously visualized extensive central pulmonary emboli have largely resolved, with minimal residual eccentric chronic emboli in the right lower lobe lobar pulmonary artery branch. Mediastinum/Nodes: No discrete thyroid nodules. Unremarkable esophagus. No pathologically enlarged axillary, mediastinal or hilar lymph nodes. Stable top-normal 0.8 cm right pericardiophrenic node (series 2/image 40). Lungs/Pleura: No  pneumothorax. No right pleural effusion. Moderate dependent left pleural effusion  is slightly increased. Compressive atelectasis in the dependent left lower lobe. Otherwise no acute consolidative airspace disease, lung masses or significant pulmonary nodules in the aerated portions of the lungs. Musculoskeletal:  No aggressive appearing focal osseous lesions. CT ABDOMEN PELVIS FINDINGS Hepatobiliary: There is mass-effect on the liver capsule by multiple bulky peritoneal masses in the perihepatic space, with representative masses as follows: - along the posterior lateral segment left liver lobe a 9.0 x 7.6 cm mass (series 2/image 51), previously 8.6 x 6.6 cm using similar measurement technique on 03/25/2016, increased - along the anterior liver a 5.0 x 2.3 cm mass (series 2/image 54), previously 4.7 x 1.7 cm, increased - along the posterior inferior right liver lobe an 8.4 x 6.9 cm mass (series 2/image 57), previously 8.2 x 6.3 cm, mildly increased -along the inferior liver a 10.5 x 9.1 cm mass (series 2/image 66), previously 9.8 x 7.1 cm, increased No liver parenchymal masses. Cholecystectomy . No biliary ductal dilatation. Pancreas: Normal, with no mass or duct dilation. Spleen: Normal size. No mass. Adrenals/Urinary Tract: Normal adrenals. No hydronephrosis. No renal mass. Normal bladder. Stomach/Bowel: Grossly normal stomach. Stable moderate umbilical hernia containing ascites and mid small bowel loops. Normal caliber small bowel with no small bowel wall thickening or pneumatosis. Oral contrast reaches the rectum. Normal appendix. Serosal 5.6 x 3.1 cm implant along the rectosigmoid junction (series 2/ image 96), previously 5.7 x 3.3 cm on 03/25/2016 using similar measurement technique, not appreciably changed. Otherwise unremarkable large bowel. Vascular/Lymphatic: Normal caliber abdominal aorta. Patent portal, splenic, hepatic and renal veins. Mildly enlarged 1.1 cm right common iliac node (series 2/ image 86),  stable. No additional pathologically enlarged abdominopelvic nodes. Reproductive: Status post hysterectomy. No discrete mass at the vaginal cuff. No discrete adnexal mass. Other: No pneumoperitoneum. Small volume ascites is increased. Soft tissue caking in the left upper quadrant fat is increased. Left lower quadrant 2.5 x 2.3 cm peritoneal implant (series 2/ image 93), previously 2.2 x 2.2 cm, slightly increased. Right lower quadrant 1.6 x 1.2 cm implant (series 2/image 90), previously 1.6 x 1.1 cm, not appreciably changed. Subcutaneous nodularity in the bilateral abdominal wall is nonspecific and presumably related to subcutaneous injection of medications. Musculoskeletal: No aggressive appearing focal osseous lesions. Moderate lumbar spondylosis, most prominent at L5-S1. IMPRESSION: 1. Interval increased burden of bulky peritoneal carcinomatosis as detailed. Small volume malignant ascites is increased. 2. Stable mild right pelvic adenopathy. 3. Moderate dependent left pleural effusion is increased. 4. Near complete resolution of previously visualized pulmonary emboli as detailed . 5. Stable moderate umbilical hernia containing small bowel loops, with no bowel complication. Electronically Signed   By: Ilona Sorrel M.D.   On: 05/28/2016 14:41    Discharge Laboratory Values: Lab Results  Component Value Date   WBC 5.3 05/28/2016   HGB 6.2 (LL) 05/28/2016   HCT 19.0 (L) 05/28/2016   MCV 91.3 05/28/2016   PLT 243 05/28/2016   Lab Results  Component Value Date   NA 138 05/28/2016   K 4.4 05/28/2016   CL 109 02/20/2016   CO2 25 05/28/2016    Brief H and P: For complete details please refer to admission H and P, but in brief, the patient was admitted to the observation unit to complete 2 units of blood transfusion.  The blood transfusion was necessary due to ischemia induced atypical chest pain.  She tolerated blood transfusion without problems  Physical Exam at Discharge: BP 127/78   Pulse 95    Temp  98.4 F (36.9 C) (Oral)   Resp 20   Ht 5\' 5"  (1.651 m)   Wt 172 lb 6.4 oz (78.2 kg)   SpO2 98%   BMI 28.69 kg/m  GENERAL:alert, no distress and comfortable SKIN: skin color, texture, turgor are normal, no rashes or significant lesions EYES: normal, Conjunctiva are pink and non-injected, sclera clear OROPHARYNX:no exudate, no erythema and lips, buccal mucosa, and tongue normal  NECK: supple, thyroid normal size, non-tender, without nodularity LYMPH:  no palpable lymphadenopathy in the cervical, axillary or inguinal LUNGS: clear to auscultation and percussion with normal breathing effort HEART: regular rate & rhythm and no murmurs and no lower extremity edema ABDOMEN:abdomen soft, non-tender and normal bowel sounds Musculoskeletal:no cyanosis of digits and no clubbing  NEURO: alert & oriented x 3 with fluent speech, no focal motor/sensory deficits  Hospital Course:  Active Problems:   Ovarian cancer, bilateral ([place])   Anemia due to antineoplastic chemotherapy   Atypical chest pain   Diet:  Regular  Activity:  As tolerated  Condition at Discharge:   stable  Signed: Dr. Heath Lark [phone]  05/29/2016, 5:16 AM

## 2016-05-30 ENCOUNTER — Ambulatory Visit (HOSPITAL_BASED_OUTPATIENT_CLINIC_OR_DEPARTMENT_OTHER): Payer: Managed Care, Other (non HMO)

## 2016-05-30 ENCOUNTER — Ambulatory Visit: Payer: Managed Care, Other (non HMO)

## 2016-05-30 ENCOUNTER — Ambulatory Visit (HOSPITAL_BASED_OUTPATIENT_CLINIC_OR_DEPARTMENT_OTHER): Payer: Managed Care, Other (non HMO) | Admitting: Hematology and Oncology

## 2016-05-30 ENCOUNTER — Other Ambulatory Visit (HOSPITAL_BASED_OUTPATIENT_CLINIC_OR_DEPARTMENT_OTHER): Payer: Managed Care, Other (non HMO)

## 2016-05-30 ENCOUNTER — Other Ambulatory Visit: Payer: Self-pay | Admitting: Internal Medicine

## 2016-05-30 ENCOUNTER — Ambulatory Visit: Payer: Managed Care, Other (non HMO) | Admitting: Nutrition

## 2016-05-30 ENCOUNTER — Telehealth: Payer: Self-pay | Admitting: Hematology and Oncology

## 2016-05-30 VITALS — BP 133/84 | HR 122 | Temp 98.6°F | Resp 18 | Ht 65.0 in | Wt 171.5 lb

## 2016-05-30 DIAGNOSIS — J45991 Cough variant asthma: Secondary | ICD-10-CM

## 2016-05-30 DIAGNOSIS — C561 Malignant neoplasm of right ovary: Secondary | ICD-10-CM

## 2016-05-30 DIAGNOSIS — C563 Malignant neoplasm of bilateral ovaries: Secondary | ICD-10-CM

## 2016-05-30 DIAGNOSIS — D6481 Anemia due to antineoplastic chemotherapy: Secondary | ICD-10-CM | POA: Diagnosis not present

## 2016-05-30 DIAGNOSIS — I82412 Acute embolism and thrombosis of left femoral vein: Secondary | ICD-10-CM

## 2016-05-30 DIAGNOSIS — R300 Dysuria: Secondary | ICD-10-CM

## 2016-05-30 DIAGNOSIS — C562 Malignant neoplasm of left ovary: Principal | ICD-10-CM

## 2016-05-30 DIAGNOSIS — T451X5A Adverse effect of antineoplastic and immunosuppressive drugs, initial encounter: Secondary | ICD-10-CM

## 2016-05-30 DIAGNOSIS — Z95828 Presence of other vascular implants and grafts: Secondary | ICD-10-CM

## 2016-05-30 DIAGNOSIS — I2602 Saddle embolus of pulmonary artery with acute cor pulmonale: Secondary | ICD-10-CM

## 2016-05-30 LAB — CBC WITH DIFFERENTIAL/PLATELET
BASO%: 0.2 % (ref 0.0–2.0)
BASOS ABS: 0 10*3/uL (ref 0.0–0.1)
EOS ABS: 0 10*3/uL (ref 0.0–0.5)
EOS%: 0.5 % (ref 0.0–7.0)
HCT: 29.7 % — ABNORMAL LOW (ref 34.8–46.6)
HGB: 9.9 g/dL — ABNORMAL LOW (ref 11.6–15.9)
LYMPH%: 24 % (ref 14.0–49.7)
MCH: 29.3 pg (ref 25.1–34.0)
MCHC: 33.3 g/dL (ref 31.5–36.0)
MCV: 87.9 fL (ref 79.5–101.0)
MONO#: 0.9 10*3/uL (ref 0.1–0.9)
MONO%: 16.7 % — AB (ref 0.0–14.0)
NEUT%: 58.6 % (ref 38.4–76.8)
NEUTROS ABS: 3.3 10*3/uL (ref 1.5–6.5)
PLATELETS: 219 10*3/uL (ref 145–400)
RBC: 3.38 10*6/uL — AB (ref 3.70–5.45)
RDW: 17.3 % — ABNORMAL HIGH (ref 11.2–14.5)
WBC: 5.6 10*3/uL (ref 3.9–10.3)
lymph#: 1.4 10*3/uL (ref 0.9–3.3)

## 2016-05-30 LAB — URINALYSIS, MICROSCOPIC - CHCC
BILIRUBIN (URINE): NEGATIVE
Blood: NEGATIVE
Glucose: NEGATIVE mg/dL
KETONES: NEGATIVE mg/dL
Nitrite: NEGATIVE
PROTEIN: 30 mg/dL
RBC / HPF: NEGATIVE (ref 0–2)
Specific Gravity, Urine: 1.01 (ref 1.003–1.035)
Urobilinogen, UR: 0.2 mg/dL (ref 0.2–1)
pH: 5 (ref 4.6–8.0)

## 2016-05-30 LAB — COMPREHENSIVE METABOLIC PANEL
ALT: 11 U/L (ref 0–55)
ANION GAP: 14 meq/L — AB (ref 3–11)
AST: 14 U/L (ref 5–34)
Albumin: 3.2 g/dL — ABNORMAL LOW (ref 3.5–5.0)
Alkaline Phosphatase: 72 U/L (ref 40–150)
BUN: 14.8 mg/dL (ref 7.0–26.0)
CHLORIDE: 102 meq/L (ref 98–109)
CO2: 24 meq/L (ref 22–29)
CREATININE: 1.2 mg/dL — AB (ref 0.6–1.1)
Calcium: 10.3 mg/dL (ref 8.4–10.4)
EGFR: 59 mL/min/{1.73_m2} — AB (ref >90)
GLUCOSE: 123 mg/dL (ref 70–140)
Potassium: 4.2 mEq/L (ref 3.5–5.1)
SODIUM: 139 meq/L (ref 136–145)
Total Bilirubin: 0.63 mg/dL (ref 0.20–1.20)
Total Protein: 7.8 g/dL (ref 6.4–8.3)

## 2016-05-30 MED ORDER — RIVAROXABAN 20 MG PO TABS: 20.0000 mg | ORAL_TABLET | Freq: Every day | ORAL | 9 refills | Status: AC

## 2016-05-30 MED ORDER — SODIUM CHLORIDE 0.9% FLUSH
10.0000 mL | Freq: Once | INTRAVENOUS | Status: AC
  Administered 2016-05-30: 10 mL
  Filled 2016-05-30: qty 10

## 2016-05-30 NOTE — Telephone Encounter (Signed)
Gave patient AVS and calender per 5/4 los. Unable to scheduled treatment for 5/21 due to availability - possibly will need to call sickle cell . Patient aware and will receive a call when scheduled .

## 2016-05-30 NOTE — Progress Notes (Signed)
Nutrition follow-up completed with patient diagnosed with recurrent ovarian cancer. Weight has decreased significantly and was documented as 171 pounds may 4 down from 197 pounds January 8. Patient reports she has a poor appetite and taste alterations limiting her oral intake. She does have nausea however improves with nausea medication. She tries to drink one boost plus daily.  Nutrition diagnosis:  Food and nutrition related knowledge deficit continues.  Severe malnutrition in the context of chronic illness secondary to 21% weight loss over 8 months and depletion of both body mass and fat stores.  Intervention:  Patient educated on strategies to consume small frequent meals and snacks. Educated patient on improving taste alterations and encouraged patient rinse mouth out with baking soda and salt water prior to eating. Recommended patient increase boost + 2 times a day between meals. Fact sheets were provided, samples were given, coupons provided and teach back method was used.  Monitoring, evaluation, goals: Patient will work to increase oral intake to promote weight maintenance.  Next visit: To be scheduled as needed.  **Disclaimer: This note was dictated with voice recognition software. Similar sounding words can inadvertently be transcribed and this note may contain transcription errors which may not have been corrected upon publication of note.**

## 2016-05-31 ENCOUNTER — Encounter: Payer: Self-pay | Admitting: Hematology and Oncology

## 2016-05-31 NOTE — Assessment & Plan Note (Signed)
I reviewed current guidelines with the patient and her husband Previously, we have reviewed imaging study which show significant large burden of disease with signs of disease progression by imaging study We discussed risks, benefits, side effects of other standard approved treatment The patient desires second opinion at [facility] and I will refer her therefore opinion and I plan to see her back afterwards to review treatment recommendations

## 2016-05-31 NOTE — Assessment & Plan Note (Signed)
Her blood counts are stable since recent blood transfusion She remained tachycardic which I believe due to deconditioning, recent stress from saddle emboli as well as pleural effusion as a cause of her symptomatic shortness of breath

## 2016-05-31 NOTE — Progress Notes (Signed)
[facility] OFFICE PROGRESS NOTE  Patient Care Team: Caryl Bis, MD as PCP - General (Family Medicine)  SUMMARY OF ONCOLOGIC HISTORY:   Ovarian cancer, bilateral ([place])   03/16/2014 Imaging    Outside hospital CT scan of the abdomen and pelvis reviewed the pelvic mass measured 9.9 cm 20.1 cm 17.8 cm consistent with ovarian cancer. 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 Alycia Rossetti performed EXPLORATORY LAPAROTOMY, EXPLORATORY CELIOTOMY WITH OR WITHOUT BIOPSY(S) URETEROLYSIS, WITH OR WITHOUT REPOSITIONING OF URETER FOR RETROPERITONEAL FIBROSIS BILATERAL S&O W/OMENTECTOMY, TAH/RADICAL DISSECT FOR DEBULK; W/PELVIC LYMPHAD/LMTD PARA-AORTIC LYMPHADENECT         03/31/2014 Pathology Results    Diagnosis:  FSA, A:Ovary and fallopian tube, left, left salpingo-oophorectomy  Histologic type:Clear cell carcinoma (see comment)   Histologic grade:high grade/grade 3   Tumor site: (ovary/primary peritoneal)ovary   Tumor size: (greatest dimension)13 x 9.5 x 6.2 cm   Ovarian surface involvement:not identified   Status of capsule: (intact/ruptured)intact   Extent of involvement of other tissues/organs (select all that apply):  Right ovary  Left ovary  Lymphovascular space invasion:present   Regional lymph nodes (see other specimens): Total number involved:1 Total number examined:10 Size of largest metastasis:microscopic focus only, approximately 1 mm;  necrosis, fibrosis, and foreign body type giant cell reaction present within two additional lymph nodes, changes suggestive of treatment effect or regression   Additional pathologic  findings:none   AJCC Pathologic Stage: pT3a1(i) pN1 pMx FIGO (2014classification) Stage Grouping:IIIA1(i)  Note:This pathologic stage assessment is based on information available at the time of this report, and is subject to change pending clinical review and additional information.  B:Ovary and fallopian tube, right, right salpingo-oophorectomy  Histologic type:clear cell carcinoma (B8)  Histologic grade:high grade/grade 3   Tumor site: (ovary/primary peritoneal)ovary   Tumor size: (greatest dimension)11 x 9 x 4.5 cm   Ovarian surface involvement:not identified   Status of capsule: (intact/ruptured)ruptured   C:Uterus and cervix, hysterectomy  Cervix  - No tumor seen   Endometrium  - Atrophic endometrium   Myometrium  - Adenomyosis and subserosal endometriosis   D:Omentum, omentectomy - No tumor seen   E:Lymph nodes, right periaortic, regional dissection - Three lymph nodes, no tumor seen (0/3)   F:Lymph nodes, right pelvic, regional dissection - 1 out of 6 lymph nodes shows minute (approximately 2 mm) focus of metastatic adenocarcinoma (1/6) - Two additional lymph nodes show necrosis, fibrosis, and foreign body type giant cell reaction suggestive of treatment effect versus regression   G:Lymph node, left pelvic, regional dissection - One lymph node, no tumor seen (0/1)   COMMENT: Multiple immunostains are performed on block A12 to further evaluate the case for metastasis to the bilateral ovaries form a distant site and to evaluate for mucinous versus clear cell change. The tumor shows focal intracytoplasmic mucin on mucin stains, and immunostains shows the tumor to be cytokeratin 7 positive, cytokeratin 20 negative, CDX2 negative, PAX8 positive, and negative for chromogranin, synaptophysin, TTF1, p53, ER, and PR, and rare positive cells on CA125, with low p53 (approximately 5%). Napsin is  positive. RCC and CD10 are negative. PAS is positive and the positive staining does not digest with PASD.  These findings supporting a tumor of primary gynecologic origin. Overall, a clear cell carcinoma is favored. Seromucinous features cannot be excluded based on the presence of scattered, faint intracytoplasmic mucin noted on mucin stain and the morphology with focal signet ring cells. Although uncommon, this has been described, however, in clear cell carcinomas, therefore the above classification is favored       03/31/2014 Tumor Marker    Patient's tumor was tested for the following markers: CA125 Results of the tumor marker test revealed 41.3      05/11/2014 Tumor Marker    Patient's tumor was tested for the following markers: CA125 Results of the tumor marker test revealed 8      05/12/2014 - 09/01/2014 Chemotherapy    She received adjuvant chemotherapy with carbo/taxol for 6 cycles      05/12/2014 Genetic Testing    Genetics testing:OvaNext panel 05-12-14 identified a variant of uncertain significance, RAD51D, 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      06/01/2014 Tumor Marker    Patient's tumor was tested for the following markers: CA125 Results of the tumor marker test revealed 10      06/27/2014 Tumor Marker    Patient's tumor was tested for the following markers: CA125 Results of the tumor marker test revealed 5      08/10/2014 Tumor Marker    .Patient's tumor was tested for the following markers: CA125 Results of the tumor marker test revealed 6      09/14/2014 Imaging    Segment of circumferential bowel wall narrowing at the junction of the sigmoid colon and rectum is concerning for a serosal metastasis with subsequent constriction of the bowel lumen. There is a moderate volume of stool throughout the colon suggesting some degree of early obstruction. There is stool in the rectum. 2. Status post hysterectomy and resection of the cystic ovarian masses. 3. Three nodules adherent to the transverse colon are  concerning for serosal metastasis. 4. Subcutaneous thickening at the umbilicus likely related to surgery.      09/14/2014 Tumor Marker    Patient's tumor was tested for the following markers: CA125 Results of the tumor marker test revealed 5      09/27/2014 PET scan    No abnormal hypermetabolism above normal colon at the rectosigmoid junction, at the site of suspected mass in narrowing on 09/14/2014. 2. Small soft tissue nodules along the distal transverse colon do not show abnormal hypermetabolism but may be too small for PET resolution.      10/05/2014 Procedure    She underwent sigmoid colon biopsy      10/05/2014 Pathology Results    Sigmoid colon biopsy revealed acute colitis, nonspecific      10/18/2014 Tumor Marker    Patient's tumor was tested for the following markers: CA125 Results of the tumor marker test revealed 5      01/08/2015 Tumor Marker    Patient's tumor was tested for the following markers: CA125 Results of the tumor marker test revealed 4      04/04/2015 Tumor Marker    Patient's tumor was tested for the following markers: CA125 Results of the tumor marker test revealed 6.9      07/12/2015 Tumor Marker    Patient's tumor was tested for the following markers: CA125 Results of the tumor marker test revealed 7.5      10/10/2015 Tumor Marker    Patient's tumor was tested for the following markers: CA125 Results of the tumor marker test revealed 11.4      11/05/2015  Tumor Marker    Patient's tumor was tested for the following markers: CA125 Results of the tumor marker test revealed 18.4      11/19/2015 Imaging    1. 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 2. New large subcapsular metastatic masses along LEFT and RIGHT hepatic lobes and positioned between the diaphragm and the RIGHT hepatic lobe. 3. Peritoneal metastasis with new nodules within the mesentery of the small bowel and colon. 4. Potential serosal  implant within the proximal sigmoid colon. No bowel obstruction 5. Small amount of ascites is new from prior. 6. Mild pelvic lymphadenopathy consistent with  metastatic disease      11/30/2015 Tumor Marker    Patient's tumor was tested for the following markers: CA125 Results of the tumor marker test revealed 32.4      12/04/2015 Procedure    Ultrasound and fluoroscopically guided right internal jugular single lumen power port catheter insertion. Tip in the SVC/RA junction. Catheter ready for use      12/06/2015 - 12/28/2015 Chemotherapy    She received 2 cycles of carbo/taxol, DC due to disease progression      12/21/2015 Tumor Marker    Patient's tumor was tested for the following markers: CA125 Results of the tumor marker test revealed 48.4      01/14/2016 Tumor Marker    Patient's tumor was tested for the following markers: CA125 Results of the tumor marker test revealed 74.6      01/16/2016 Imaging    Prominent interval progression of metastatic disease in the abdomen and pelvis. Large capsular lesions along the liver are associated with enlarging and new omental, mesenteric and peritoneal nodules and masses throughout the abdomen and pelvis. 2. Interval progression of intraperitoneal free fluid. 3. Interval development of new small left pleural effusion. 4. Lesion involving the sigmoid colon has progressed in the interval. No evidence for colonic obstruction at this time.      01/16/2016 Imaging    LV EF: 55% -   60%      01/25/2016 - 05/02/2016 Chemotherapy    She received Avastin with Doxil. Avastin was discontinued when she developed massive PE.  Doxil was stopped due to disease progression       02/16/2016 Imaging    Massive bilateral occlusive pulmonary emboli. CT evidence of right heart strain (RV/LV Ratio = 1.46) consistent with at least submassive (intermediate risk) PE. The presence of right heart strain has been associated with an increased risk of morbidity and  mortality. 2. Large layering RIGHT pleural effusion. Concern for malignant effusion. 3. New large mass in the gastrohepatic ligament (6 cm) concerning for progression of ovarian carcinoma.      02/17/2016 - 02/21/2016 Hospital Admission    She was admitted to the hospital and was found to have DVT and severe PE with heart strain. She was discharged home on Lovenox      02/18/2016 Imaging    Normal left ventricular size and systolic function. Severely dilated right ventricle with moderately decreased systolic function and severe pulmonary hypertension suspicious for pulmonary embolism.      02/18/2016 Imaging    Findings consistent with acute deep vein thrombosis involving the popliteal and distal femoral veins of the left lower extremity.       02/25/2016 Tumor Marker    Patient's tumor was tested for the following markers: CA125 Results of the tumor marker test revealed 89.5      03/25/2016 Imaging    No  acute findings within the abdomen or pelvis. 2. Large volume of peritoneal tumor is identified within the abdomen and pelvis. This is stable to slightly increased in size in the interval. 3. Decrease in volume of ascites 4. Left pleural effusion is slightly increased in volume from previous exam.      03/28/2016 Adverse Reaction    She received 2 units of blood transfusion      04/04/2016 Tumor Marker    Patient's tumor was tested for the following markers: CA125 Results of the tumor marker test revealed 71.2      05/02/2016 Tumor Marker    Patient's tumor was tested for the following markers: CA125 Results of the tumor marker test revealed 58.2      05/22/2016 Imaging    Left ventricle: The cavity size was normal. Systolic function was normal. Wall motion was normal; there were no regional wall motion abnormalities. Doppler parameters are consistent with abnormal left ventricular relaxation (grade 1 diastolic dysfunction).      05/28/2016 Imaging    CT: 1. Interval increased burden  of bulky peritoneal carcinomatosis as detailed. Small volume malignant ascites is increased. 2. Stable mild right pelvic adenopathy. 3. Moderate dependent left pleural effusion is increased. 4. Near complete resolution of previously visualized pulmonary emboli as detailed . 5. Stable moderate umbilical hernia containing small bowel loops, with no bowel complication.      05/28/2016 Tumor Marker    Patient's tumor was tested for the following markers: CA125 Results of the tumor marker test revealed 44.5       INTERVAL HISTORY: Please see below for problem oriented charting. She returns with her husband for further follow-up and review of test results She continues to have symptoms of shortness of breath with minimal exertion and palpitation Her symptoms have much improved since blood transfusion She denies leg edema  no recent cough  REVIEW OF SYSTEMS:   Constitutional: Denies fevers, chills or abnormal weight loss Eyes: Denies blurriness of vision Ears, nose, mouth, throat, and face: Denies mucositis or sore throat Respiratory: Denies cough,or wheezes Gastrointestinal:  Denies nausea, heartburn or change in bowel habits Skin: Denies abnormal skin rashes Lymphatics: Denies new lymphadenopathy or easy bruising Neurological:Denies numbness, tingling or new weaknesses Behavioral/Psych: Mood is stable, no new changes  All other systems were reviewed with the patient and are negative.  I have reviewed the past medical history, past surgical history, social history and family history with the patient and they are unchanged from previous note.  ALLERGIES:  is allergic to sulfa antibiotics.  MEDICATIONS:  Current Outpatient Prescriptions  Medication Sig Dispense Refill  . acetaminophen (TYLENOL) 500 MG tablet Take 1,000 mg by mouth every 6 (six) hours as needed.    . chlorpheniramine (CHLOR-TRIMETON) 4 MG tablet Take 4 mg by mouth 2 (two) times daily as needed for allergies.    Marland Kitchen  enoxaparin (LOVENOX) 80 MG/0.8ML injection Inject 0.8 mLs (80 mg total) into the skin every 12 (twelve) hours. 60 Syringe 9  . famotidine (PEPCID) 20 MG tablet One at bedtime 30 tablet 2  . fluticasone (FLONASE) 50 MCG/ACT nasal spray Place 1 spray into both nostrils daily as needed. (Patient taking differently: Place 1 spray into both nostrils daily. ) 16 g 0  . HYDROmorphone (DILAUDID) 2 MG tablet Take 1 tablet (2 mg total) by mouth every 4 (four) hours as needed for severe pain. 30 tablet 0  . lidocaine-prilocaine (EMLA) cream Apply 1-2 hours prior to [facility] access as directed. Goodnight  g 1  . LORazepam (ATIVAN) 0.5 MG tablet Take 1 tablet (0.5 mg total) by mouth every 6 (six) hours as needed for anxiety. 60 tablet 0  . omeprazole (PRILOSEC) 20 MG capsule Take 2 tablets x 30-60 min before first meal of the day    . polyethylene glycol (MIRALAX / GLYCOLAX) packet Take 17 g by mouth daily as needed.     Marland Kitchen HYDROcodone-homatropine (HYCODAN) 5-1.5 MG/5ML syrup TAKE 5 MLS BY MOUTH EVERY SIX HOURS AS NEEDED FOR COUGH  0  . ondansetron (ZOFRAN) 8 MG tablet Take 1 tablet (8 mg total) by mouth every 8 (eight) hours as needed for nausea (Will not make drowsy). (Patient not taking: Reported on 05/30/2016) 30 tablet 2  . rivaroxaban (XARELTO) 20 MG TABS tablet Take 1 tablet (20 mg total) by mouth daily with supper. 30 tablet 9   No current facility-administered medications for this visit.    Facility-Administered Medications Ordered in Other Visits  Medication Dose Route Frequency Provider Last Rate Last Dose  . ferumoxytol (FERAHEME) 510 mg in sodium chloride 0.9 % 100 mL IVPB  510 mg Intravenous Once Livesay, [name], MD        PHYSICAL EXAMINATION: ECOG PERFORMANCE STATUS: 1 - Symptomatic but completely ambulatory  Vitals:   05/30/16 0928  BP: 133/84  Pulse: (!) 122  Resp: 18  Temp: 98.6 F (37 C)   Filed Weights   05/30/16 0928  Weight: 171 lb 8 oz (77.8 kg)    GENERAL:alert, no distress  and comfortable SKIN: skin color, texture, turgor are normal, no rashes or significant lesions EYES: normal, Conjunctiva are pink and non-injected, sclera clear ABDOMEN:abdomen soft, non-tender and normal bowel sounds Musculoskeletal:no cyanosis of digits and no clubbing  NEURO: alert & oriented x 3 with fluent speech, no focal motor/sensory deficits  LABORATORY DATA:  I have reviewed the data as listed    Component Value Date/Time   NA 139 05/30/2016 0900   K 4.2 05/30/2016 0900   CL 109 02/20/2016 0531   CO2 24 05/30/2016 0900   GLUCOSE 123 05/30/2016 0900   BUN 14.8 05/30/2016 0900   CREATININE 1.2 (H) 05/30/2016 0900   CALCIUM 10.3 05/30/2016 0900   PROT 7.8 05/30/2016 0900   ALBUMIN 3.2 (L) 05/30/2016 0900   AST 14 05/30/2016 0900   ALT 11 05/30/2016 0900   ALKPHOS 72 05/30/2016 0900   BILITOT 0.63 05/30/2016 0900   GFRNONAA 39 (L) 02/20/2016 0531   GFRAA 45 (L) 02/20/2016 0531    No results found for: SPEP, UPEP  Lab Results  Component Value Date   WBC 5.6 05/30/2016   NEUTROABS 3.3 05/30/2016   HGB 9.9 (L) 05/30/2016   HCT 29.7 (L) 05/30/2016   MCV 87.9 05/30/2016   PLT 219 05/30/2016      Chemistry      Component Value Date/Time   NA 139 05/30/2016 0900   K 4.2 05/30/2016 0900   CL 109 02/20/2016 0531   CO2 24 05/30/2016 0900   BUN 14.8 05/30/2016 0900   CREATININE 1.2 (H) 05/30/2016 0900      Component Value Date/Time   CALCIUM 10.3 05/30/2016 0900   ALKPHOS 72 05/30/2016 0900   AST 14 05/30/2016 0900   ALT 11 05/30/2016 0900   BILITOT 0.63 05/30/2016 0900       RADIOGRAPHIC STUDIES: I have personally reviewed the radiological images as listed and agreed with the findings in the report. Ct Chest W Contrast  Result Date: 05/28/2016 CLINICAL  DATA:  Clear cell ovarian carcinoma diagnosed March 2016 at [facility] with omentectomy and lymph node dissection. Patient presents for restaging with ongoing chemotherapy. EXAM: CT CHEST, ABDOMEN, AND PELVIS  WITH CONTRAST TECHNIQUE: Multidetector CT imaging of the chest, abdomen and pelvis was performed following the standard protocol during bolus administration of intravenous contrast. CONTRAST:  132m ISOVUE-300 IOPAMIDOL (ISOVUE-300) INJECTION 61% COMPARISON:  03/25/2016 CT abdomen/ pelvis. 02/17/2016 chest CT angiogram. FINDINGS: CT CHEST FINDINGS Cardiovascular: Normal heart size. No significant pericardial fluid/thickening. Right internal jugular MediPort terminates near the cavoatrial junction. Great vessels are normal in course and caliber. Previously visualized extensive central pulmonary emboli have largely resolved, with minimal residual eccentric chronic emboli in the right lower lobe lobar pulmonary artery branch. Mediastinum/Nodes: No discrete thyroid nodules. Unremarkable esophagus. No pathologically enlarged axillary, mediastinal or hilar lymph nodes. Stable top-normal 0.8 cm right pericardiophrenic node (series 2/image 40). Lungs/Pleura: No pneumothorax. No right pleural effusion. Moderate dependent left pleural effusion is slightly increased. Compressive atelectasis in the dependent left lower lobe. Otherwise no acute consolidative airspace disease, lung masses or significant pulmonary nodules in the aerated portions of the lungs. Musculoskeletal:  No aggressive appearing focal osseous lesions. CT ABDOMEN PELVIS FINDINGS Hepatobiliary: There is mass-effect on the liver capsule by multiple bulky peritoneal masses in the perihepatic space, with representative masses as follows: - along the posterior lateral segment left liver lobe a 9.0 x 7.6 cm mass (series 2/image 51), previously 8.6 x 6.6 cm using similar measurement technique on 03/25/2016, increased - along the anterior liver a 5.0 x 2.3 cm mass (series 2/image 54), previously 4.7 x 1.7 cm, increased - along the posterior inferior right liver lobe an 8.4 x 6.9 cm mass (series 2/image 57), previously 8.2 x 6.3 cm, mildly increased -along the inferior  liver a 10.5 x 9.1 cm mass (series 2/image 66), previously 9.8 x 7.1 cm, increased No liver parenchymal masses. Cholecystectomy . No biliary ductal dilatation. Pancreas: Normal, with no mass or duct dilation. Spleen: Normal size. No mass. Adrenals/Urinary Tract: Normal adrenals. No hydronephrosis. No renal mass. Normal bladder. Stomach/Bowel: Grossly normal stomach. Stable moderate umbilical hernia containing ascites and mid small bowel loops. Normal caliber small bowel with no small bowel wall thickening or pneumatosis. Oral contrast reaches the rectum. Normal appendix. Serosal 5.6 x 3.1 cm implant along the rectosigmoid junction (series 2/ image 96), previously 5.7 x 3.3 cm on 03/25/2016 using similar measurement technique, not appreciably changed. Otherwise unremarkable large bowel. Vascular/Lymphatic: Normal caliber abdominal aorta. Patent portal, splenic, hepatic and renal veins. Mildly enlarged 1.1 cm right common iliac node (series 2/ image 86), stable. No additional pathologically enlarged abdominopelvic nodes. Reproductive: Status post hysterectomy. No discrete mass at the vaginal cuff. No discrete adnexal mass. Other: No pneumoperitoneum. Small volume ascites is increased. Soft tissue caking in the left upper quadrant fat is increased. Left lower quadrant 2.5 x 2.3 cm peritoneal implant (series 2/ image 93), previously 2.2 x 2.2 cm, slightly increased. Right lower quadrant 1.6 x 1.2 cm implant (series 2/image 90), previously 1.6 x 1.1 cm, not appreciably changed. Subcutaneous nodularity in the bilateral abdominal wall is nonspecific and presumably related to subcutaneous injection of medications. Musculoskeletal: No aggressive appearing focal osseous lesions. Moderate lumbar spondylosis, most prominent at L5-S1. IMPRESSION: 1. Interval increased burden of bulky peritoneal carcinomatosis as detailed. Small volume malignant ascites is increased. 2. Stable mild right pelvic adenopathy. 3. Moderate dependent  left pleural effusion is increased. 4. Near complete resolution of previously visualized pulmonary emboli as detailed .  5. Stable moderate umbilical hernia containing small bowel loops, with no bowel complication. Electronically Signed   By: Ilona Sorrel M.D.   On: 05/28/2016 14:41   Ct Abdomen Pelvis W Contrast  Result Date: 05/28/2016 CLINICAL DATA:  Clear cell ovarian carcinoma diagnosed March 2016 at [facility] with omentectomy and lymph node dissection. Patient presents for restaging with ongoing chemotherapy. EXAM: CT CHEST, ABDOMEN, AND PELVIS WITH CONTRAST TECHNIQUE: Multidetector CT imaging of the chest, abdomen and pelvis was performed following the standard protocol during bolus administration of intravenous contrast. CONTRAST:  141m ISOVUE-300 IOPAMIDOL (ISOVUE-300) INJECTION 61% COMPARISON:  03/25/2016 CT abdomen/ pelvis. 02/17/2016 chest CT angiogram. FINDINGS: CT CHEST FINDINGS Cardiovascular: Normal heart size. No significant pericardial fluid/thickening. Right internal jugular MediPort terminates near the cavoatrial junction. Great vessels are normal in course and caliber. Previously visualized extensive central pulmonary emboli have largely resolved, with minimal residual eccentric chronic emboli in the right lower lobe lobar pulmonary artery branch. Mediastinum/Nodes: No discrete thyroid nodules. Unremarkable esophagus. No pathologically enlarged axillary, mediastinal or hilar lymph nodes. Stable top-normal 0.8 cm right pericardiophrenic node (series 2/image 40). Lungs/Pleura: No pneumothorax. No right pleural effusion. Moderate dependent left pleural effusion is slightly increased. Compressive atelectasis in the dependent left lower lobe. Otherwise no acute consolidative airspace disease, lung masses or significant pulmonary nodules in the aerated portions of the lungs. Musculoskeletal:  No aggressive appearing focal osseous lesions. CT ABDOMEN PELVIS FINDINGS Hepatobiliary: There is mass-effect  on the liver capsule by multiple bulky peritoneal masses in the perihepatic space, with representative masses as follows: - along the posterior lateral segment left liver lobe a 9.0 x 7.6 cm mass (series 2/image 51), previously 8.6 x 6.6 cm using similar measurement technique on 03/25/2016, increased - along the anterior liver a 5.0 x 2.3 cm mass (series 2/image 54), previously 4.7 x 1.7 cm, increased - along the posterior inferior right liver lobe an 8.4 x 6.9 cm mass (series 2/image 57), previously 8.2 x 6.3 cm, mildly increased -along the inferior liver a 10.5 x 9.1 cm mass (series 2/image 66), previously 9.8 x 7.1 cm, increased No liver parenchymal masses. Cholecystectomy . No biliary ductal dilatation. Pancreas: Normal, with no mass or duct dilation. Spleen: Normal size. No mass. Adrenals/Urinary Tract: Normal adrenals. No hydronephrosis. No renal mass. Normal bladder. Stomach/Bowel: Grossly normal stomach. Stable moderate umbilical hernia containing ascites and mid small bowel loops. Normal caliber small bowel with no small bowel wall thickening or pneumatosis. Oral contrast reaches the rectum. Normal appendix. Serosal 5.6 x 3.1 cm implant along the rectosigmoid junction (series 2/ image 96), previously 5.7 x 3.3 cm on 03/25/2016 using similar measurement technique, not appreciably changed. Otherwise unremarkable large bowel. Vascular/Lymphatic: Normal caliber abdominal aorta. Patent portal, splenic, hepatic and renal veins. Mildly enlarged 1.1 cm right common iliac node (series 2/ image 86), stable. No additional pathologically enlarged abdominopelvic nodes. Reproductive: Status post hysterectomy. No discrete mass at the vaginal cuff. No discrete adnexal mass. Other: No pneumoperitoneum. Small volume ascites is increased. Soft tissue caking in the left upper quadrant fat is increased. Left lower quadrant 2.5 x 2.3 cm peritoneal implant (series 2/ image 93), previously 2.2 x 2.2 cm, slightly increased. Right  lower quadrant 1.6 x 1.2 cm implant (series 2/image 90), previously 1.6 x 1.1 cm, not appreciably changed. Subcutaneous nodularity in the bilateral abdominal wall is nonspecific and presumably related to subcutaneous injection of medications. Musculoskeletal: No aggressive appearing focal osseous lesions. Moderate lumbar spondylosis, most prominent at L5-S1. IMPRESSION: 1. Interval increased  burden of bulky peritoneal carcinomatosis as detailed. Small volume malignant ascites is increased. 2. Stable mild right pelvic adenopathy. 3. Moderate dependent left pleural effusion is increased. 4. Near complete resolution of previously visualized pulmonary emboli as detailed . 5. Stable moderate umbilical hernia containing small bowel loops, with no bowel complication. Electronically Signed   By: Ilona Sorrel M.D.   On: 05/28/2016 14:41    ASSESSMENT & PLAN:  Ovarian cancer, bilateral ([place]) I reviewed current guidelines with the patient and her husband Previously, we have reviewed imaging study which show significant large burden of disease with signs of disease progression by imaging study We discussed risks, benefits, side effects of other standard approved treatment The patient desires second opinion at [facility] and I will refer her therefore opinion and I plan to see her back afterwards to review treatment recommendations  Acute saddle pulmonary embolism with acute cor pulmonale ([place]) She is improving since discharge from hospital and is not using oxygen Since CT scan showed resolution of blood clot, I will transition her to oral anticoagulation therapy I recommend transition her to Xarelto and I gave her a manufacturer's coupon for first 30 days free of charge I have asked her husband to inquire prescription co-pay for next month The risks, benefits, side effects of Xarelto is discussed with the patient and her husband and they agreed to proceed Echocardiogram reviewed preserved ejection fraction   Anemia  due to antineoplastic chemotherapy Her blood counts are stable since recent blood transfusion She remained tachycardic which I believe due to deconditioning, recent stress from saddle emboli as well as pleural effusion as a cause of her symptomatic shortness of breath  Dysuria She has sensation of dysuria which I suspect could be due to impression on the bladder from tumor Just to be sure, I will order urinalysis and urine culture to exclude UTI   Orders Placed This Encounter  Procedures  . Urine culture    Standing Status:   Future    Number of Occurrences:   1    Standing Expiration Date:   07/04/2017  . Urinalysis, Microscopic - [facility]    Standing Status:   Future    Number of Occurrences:   1    Standing Expiration Date:   07/04/2017  . Hold Tube, Blood Bank    Standing Status:   Future    Standing Expiration Date:   07/04/2017   All questions were answered. The patient knows to call the clinic with any problems, questions or concerns. No barriers to learning was detected. I spent 20 minutes counseling the patient face to face. The total time spent in the appointment was 30 minutes and more than 50% was on counseling and review of test results     Heath Lark, MD 05/31/2016 6:37 AM

## 2016-05-31 NOTE — Assessment & Plan Note (Signed)
She has sensation of dysuria which I suspect could be due to impression on the bladder from tumor Just to be sure, I will order urinalysis and urine culture to exclude UTI

## 2016-05-31 NOTE — Assessment & Plan Note (Addendum)
She is improving since discharge from hospital and is not using oxygen Since CT scan showed resolution of blood clot, I will transition her to oral anticoagulation therapy I recommend transition her to Xarelto and I gave her a manufacturer's coupon for first 30 days free of charge I have asked her husband to inquire prescription co-pay for next month The risks, benefits, side effects of Xarelto is discussed with the patient and her husband and they agreed to proceed Echocardiogram reviewed preserved ejection fraction

## 2016-06-02 ENCOUNTER — Telehealth: Payer: Self-pay

## 2016-06-02 ENCOUNTER — Other Ambulatory Visit: Payer: Self-pay

## 2016-06-02 LAB — URINE CULTURE

## 2016-06-02 MED ORDER — CIPROFLOXACIN HCL 250 MG PO TABS: 250.0000 mg | ORAL_TABLET | Freq: Two times a day (BID) | ORAL | 0 refills | Status: AC

## 2016-06-02 NOTE — Telephone Encounter (Signed)
Patient given below message, Rx sent to [facility].

## 2016-06-02 NOTE — Telephone Encounter (Signed)
-----   Message from Heath Lark, MD sent at 06/02/2016  8:24 AM EDT ----- Regarding: UTI Urine culture confirmed UTI Please call in cipro 250 mg BID PO x 7 days ----- Message ----- From: Interface, Lab In Three [facility] One Sent: 05/30/2016  11:06 AM To: Heath Lark, MD

## 2016-06-03 ENCOUNTER — Telehealth: Payer: Self-pay

## 2016-06-03 NOTE — Telephone Encounter (Signed)
Called and left message to call us back. Attempting to call referral.

## 2016-06-03 NOTE — Telephone Encounter (Signed)
Left message regarding referral and call back.

## 2016-06-04 ENCOUNTER — Telehealth: Payer: Self-pay

## 2016-06-04 NOTE — Telephone Encounter (Signed)
Faxed last office to [facility] [phone] and called left message at about referral on phone # [phone].

## 2016-06-06 ENCOUNTER — Telehealth: Payer: Self-pay | Admitting: Hematology and Oncology

## 2016-06-06 NOTE — Telephone Encounter (Signed)
Called to confirm appt made by Andria Frames at [facility] for 5/21 - patient aware

## 2016-06-11 ENCOUNTER — Telehealth: Payer: Self-pay | Admitting: *Deleted

## 2016-06-11 ENCOUNTER — Telehealth: Payer: Self-pay | Admitting: Hematology and Oncology

## 2016-06-11 NOTE — Telephone Encounter (Signed)
Spoke with patient. OK to cancel 5/21 appt and reschedule after seeing Dr Haynes Bast

## 2016-06-11 NOTE — Telephone Encounter (Signed)
I have reviewed with her GYN oncologist from [facility]. I appreciated her consult see the patient. I also reviewed recommendations with the patient's GYN oncologist, Dr. Alycia Rossetti Dr. Alycia Rossetti will try to set up with appointment to see the patient back and to review treatment options.

## 2016-06-11 NOTE — Telephone Encounter (Signed)
-----   Message from Heath Lark, MD sent at 06/11/2016  9:08 AM EDT ----- Regarding: GYN onc review Hi Roanne Haye,  Please call her and let her know I spoke with the doctor at [facility] and with Dr. Alycia Rossetti today Dr. Alycia Rossetti says she will reach out to the patient to discuss options. I recommend she cancel her appt on 5/21 and reschedule after her appt to see Dr. Alycia Rossetti If she agrees, document and cancel her appt next week  Thanks

## 2016-06-13 ENCOUNTER — Telehealth: Payer: Self-pay | Admitting: Gynecologic Oncology

## 2016-06-13 NOTE — Telephone Encounter (Signed)
TC to patient. She says that she is actually feeling better now than she has in a long time. We will send her tumor for [facility] testing and I will take the responsibility of doing that and calling her with the results. If HRD+ PARP, we are also still waiting to hear back from the [place] and depending on the frequency of visits, this may be something that she is interested in. Off protocol and if [facility] does not provide [place] any additional information, I would favor weekly paclitaxel or gemcitabine. I am not sure if her bone marrow would tolerate topotecan. She knows that I will forward this to Dr. Alvy Bimler. PG

## 2016-06-16 ENCOUNTER — Other Ambulatory Visit: Payer: Self-pay | Admitting: Internal Medicine

## 2016-06-16 ENCOUNTER — Other Ambulatory Visit: Payer: Self-pay

## 2016-06-16 ENCOUNTER — Encounter (HOSPITAL_COMMUNITY): Payer: Self-pay

## 2016-06-16 ENCOUNTER — Ambulatory Visit: Payer: Self-pay | Admitting: Hematology and Oncology

## 2016-06-16 DIAGNOSIS — J45991 Cough variant asthma: Secondary | ICD-10-CM

## 2016-06-18 ENCOUNTER — Telehealth: Payer: Self-pay | Admitting: *Deleted

## 2016-06-18 NOTE — Telephone Encounter (Signed)
Called patient to discuss how she wants to proceed. She states she is waiting for a return call from Dr Theora Gianotti to talk further about the clinical trial in [place]. Audrey Porter will call once she has heard from Dr Theora Gianotti.

## 2016-06-26 ENCOUNTER — Telehealth: Payer: Self-pay | Admitting: *Deleted

## 2016-06-26 ENCOUNTER — Telehealth: Payer: Self-pay

## 2016-06-26 ENCOUNTER — Telehealth: Payer: Self-pay | Admitting: Hematology and Oncology

## 2016-06-26 NOTE — Telephone Encounter (Signed)
Pt called requesting appt for checking if her blood is low. Called pt back: She is really tired. BP was 97 systolic. She is riding, not driving, to [place] to move daughter to [place], during this conversation. She will be back Monday and wants an appt on Monday. Discussed hydration and taking frequent rest breaks, and keeping a chair in shower so she can sit, and not getting over heated.

## 2016-06-26 NOTE — Telephone Encounter (Signed)
Mrs Dines will be here on Monday for labs and see Dr Alvy Bimler

## 2016-06-26 NOTE — Telephone Encounter (Signed)
sw pt to confirm office visit 6/4 at 0900 per sch msg

## 2016-06-26 NOTE — Telephone Encounter (Signed)
I have placed scheduling msg to check her labs and see me Monday She needs to come in early around 9 am, see me and tx

## 2016-06-30 ENCOUNTER — Ambulatory Visit (HOSPITAL_BASED_OUTPATIENT_CLINIC_OR_DEPARTMENT_OTHER): Payer: Managed Care, Other (non HMO) | Admitting: Hematology and Oncology

## 2016-06-30 ENCOUNTER — Other Ambulatory Visit (HOSPITAL_BASED_OUTPATIENT_CLINIC_OR_DEPARTMENT_OTHER): Payer: Managed Care, Other (non HMO)

## 2016-06-30 ENCOUNTER — Encounter: Payer: Self-pay | Admitting: Hematology and Oncology

## 2016-06-30 ENCOUNTER — Ambulatory Visit (HOSPITAL_COMMUNITY)
Admission: RE | Admit: 2016-06-30 | Discharge: 2016-06-30 | Disposition: A | Discharge status: Home / Self Care | Payer: Managed Care, Other (non HMO) | Source: Ambulatory Visit | Attending: Hematology and Oncology | Admitting: Hematology and Oncology

## 2016-06-30 ENCOUNTER — Telehealth: Payer: Self-pay | Admitting: Hematology and Oncology

## 2016-06-30 ENCOUNTER — Other Ambulatory Visit: Payer: Self-pay | Admitting: Hematology and Oncology

## 2016-06-30 DIAGNOSIS — N183 Chronic kidney disease, stage 3 unspecified: Secondary | ICD-10-CM

## 2016-06-30 DIAGNOSIS — D6481 Anemia due to antineoplastic chemotherapy: Secondary | ICD-10-CM | POA: Insufficient documentation

## 2016-06-30 DIAGNOSIS — C562 Malignant neoplasm of left ovary: Secondary | ICD-10-CM | POA: Diagnosis not present

## 2016-06-30 DIAGNOSIS — I82412 Acute embolism and thrombosis of left femoral vein: Secondary | ICD-10-CM

## 2016-06-30 DIAGNOSIS — R64 Cachexia: Secondary | ICD-10-CM | POA: Diagnosis not present

## 2016-06-30 DIAGNOSIS — C563 Malignant neoplasm of bilateral ovaries: Secondary | ICD-10-CM

## 2016-06-30 DIAGNOSIS — C561 Malignant neoplasm of right ovary: Secondary | ICD-10-CM

## 2016-06-30 DIAGNOSIS — T451X5A Adverse effect of antineoplastic and immunosuppressive drugs, initial encounter: Principal | ICD-10-CM

## 2016-06-30 DIAGNOSIS — I2602 Saddle embolus of pulmonary artery with acute cor pulmonale: Secondary | ICD-10-CM | POA: Diagnosis not present

## 2016-06-30 DIAGNOSIS — C569 Malignant neoplasm of unspecified ovary: Secondary | ICD-10-CM

## 2016-06-30 DIAGNOSIS — Z7189 Other specified counseling: Secondary | ICD-10-CM | POA: Insufficient documentation

## 2016-06-30 LAB — CBC WITH DIFFERENTIAL/PLATELET
BASO%: 0.2 % (ref 0.0–2.0)
Basophils Absolute: 0 10*3/uL (ref 0.0–0.1)
EOS ABS: 0.1 10*3/uL (ref 0.0–0.5)
EOS%: 1.1 % (ref 0.0–7.0)
HCT: 25.8 % — ABNORMAL LOW (ref 34.8–46.6)
HEMOGLOBIN: 8.1 g/dL — AB (ref 11.6–15.9)
LYMPH#: 1.6 10*3/uL (ref 0.9–3.3)
LYMPH%: 19.3 % (ref 14.0–49.7)
MCH: 28.5 pg (ref 25.1–34.0)
MCHC: 31.4 g/dL — ABNORMAL LOW (ref 31.5–36.0)
MCV: 90.8 fL (ref 79.5–101.0)
MONO#: 0.7 10*3/uL (ref 0.1–0.9)
MONO%: 7.7 % (ref 0.0–14.0)
NEUT%: 71.7 % (ref 38.4–76.8)
NEUTROS ABS: 6 10*3/uL (ref 1.5–6.5)
PLATELETS: 318 10*3/uL (ref 145–400)
RBC: 2.84 10*6/uL — ABNORMAL LOW (ref 3.70–5.45)
RDW: 16.4 % — AB (ref 11.2–14.5)
WBC: 8.4 10*3/uL (ref 3.9–10.3)

## 2016-06-30 LAB — COMPREHENSIVE METABOLIC PANEL
ALBUMIN: 2.9 g/dL — AB (ref 3.5–5.0)
ALK PHOS: 96 U/L (ref 40–150)
ALT: 10 U/L (ref 0–55)
ANION GAP: 15 meq/L — AB (ref 3–11)
AST: 14 U/L (ref 5–34)
BILIRUBIN TOTAL: 0.59 mg/dL (ref 0.20–1.20)
BUN: 35 mg/dL — ABNORMAL HIGH (ref 7.0–26.0)
CO2: 23 mEq/L (ref 22–29)
CREATININE: 1.5 mg/dL — AB (ref 0.6–1.1)
Calcium: 10.6 mg/dL — ABNORMAL HIGH (ref 8.4–10.4)
Chloride: 101 mEq/L (ref 98–109)
EGFR: 44 mL/min/{1.73_m2} — AB (ref >90)
GLUCOSE: 101 mg/dL (ref 70–140)
Potassium: 4.4 mEq/L (ref 3.5–5.1)
Sodium: 139 mEq/L (ref 136–145)
Total Protein: 8.4 g/dL — ABNORMAL HIGH (ref 6.4–8.3)

## 2016-06-30 MED ORDER — MIRTAZAPINE 30 MG PO TABS: 30.0000 mg | ORAL_TABLET | Freq: Every day | ORAL | 1 refills | Status: AC

## 2016-06-30 NOTE — Assessment & Plan Note (Signed)
She has mild acute renal failure, likely due to hypoperfusion from severe anemia Recommend increase fluid hydration

## 2016-06-30 NOTE — Assessment & Plan Note (Signed)
She has mild hypercalcemia, could be due to dehydration and untreated cancer Again, recommend increase oral fluid hydration and will recheck again next week

## 2016-06-30 NOTE — Progress Notes (Signed)
[place] OFFICE PROGRESS NOTE  Patient Care Team: Caryl Bis, MD as PCP - General (Family Medicine)  SUMMARY OF ONCOLOGIC HISTORY:   Ovarian cancer, bilateral ([place])   03/16/2014 Imaging    Outside hospital CT scan of the abdomen and pelvis reviewed the pelvic mass measured 9.9 cm 20.1 cm 17.8 cm consistent with ovarian cancer. 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 Alycia Rossetti performed EXPLORATORY LAPAROTOMY, EXPLORATORY CELIOTOMY WITH OR WITHOUT BIOPSY(S) URETEROLYSIS, WITH OR WITHOUT REPOSITIONING OF URETER FOR RETROPERITONEAL FIBROSIS BILATERAL S&O W/OMENTECTOMY, TAH/RADICAL DISSECT FOR DEBULK; W/PELVIC LYMPHAD/LMTD PARA-AORTIC LYMPHADENECT         03/31/2014 Pathology Results    Diagnosis:  FSA, A:Ovary and fallopian tube, left, left salpingo-oophorectomy  Histologic type:Clear cell carcinoma (see comment)   Histologic grade:high grade/grade 3   Tumor site: (ovary/primary peritoneal)ovary   Tumor size: (greatest dimension)13 x 9.5 x 6.2 cm   Ovarian surface involvement:not identified   Status of capsule: (intact/ruptured)intact   Extent of involvement of other tissues/organs (select all that apply):  Right ovary  Left ovary  Lymphovascular space invasion:present   Regional lymph nodes (see other specimens): Total number involved:1 Total number examined:10 Size of largest metastasis:microscopic focus only, approximately 1 mm;  necrosis, fibrosis, and foreign body type giant cell reaction present within two additional lymph nodes, changes suggestive of treatment effect or regression   Additional pathologic  findings:none   AJCC Pathologic Stage: pT3a1(i) pN1 pMx FIGO (2014classification) Stage Grouping:IIIA1(i)  Note:This pathologic stage assessment is based on information available at the time of this report, and is subject to change pending clinical review and additional information.  B:Ovary and fallopian tube, right, right salpingo-oophorectomy  Histologic type:clear cell carcinoma (B8)  Histologic grade:high grade/grade 3   Tumor site: (ovary/primary peritoneal)ovary   Tumor size: (greatest dimension)11 x 9 x 4.5 cm   Ovarian surface involvement:not identified   Status of capsule: (intact/ruptured)ruptured   C:Uterus and cervix, hysterectomy  Cervix  - No tumor seen   Endometrium  - Atrophic endometrium   Myometrium  - Adenomyosis and subserosal endometriosis   D:Omentum, omentectomy - No tumor seen   E:Lymph nodes, right periaortic, regional dissection - Three lymph nodes, no tumor seen (0/3)   F:Lymph nodes, right pelvic, regional dissection - 1 out of 6 lymph nodes shows minute (approximately 2 mm) focus of metastatic adenocarcinoma (1/6) - Two additional lymph nodes show necrosis, fibrosis, and foreign body type giant cell reaction suggestive of treatment effect versus regression   G:Lymph node, left pelvic, regional dissection - One lymph node, no tumor seen (0/1)   COMMENT: Multiple immunostains are performed on block A12 to further evaluate the case for metastasis to the bilateral ovaries form a distant site and to evaluate for mucinous versus clear cell change. The tumor shows focal intracytoplasmic mucin on mucin stains, and immunostains shows the tumor to be cytokeratin 7 positive, cytokeratin 20 negative, CDX2 negative, PAX8 positive, and negative for chromogranin, synaptophysin, TTF1, p53, ER, and PR, and rare positive cells on CA125, with low p53 (approximately 5%). Napsin is  positive. RCC and CD10 are negative. PAS is positive and the positive staining does not digest with PASD.  These findings supporting a tumor of primary gynecologic origin. Overall, a clear cell carcinoma is favored. Seromucinous features cannot be excluded based on the presence of scattered, faint intracytoplasmic mucin noted on mucin stain and the morphology with focal signet ring cells. Although uncommon, this has been described, however, in clear cell carcinomas, therefore the above classification is favored       03/31/2014 Tumor Marker    Patient's tumor was tested for the following markers: CA125 Results of the tumor marker test revealed 41.3      05/11/2014 Tumor Marker    Patient's tumor was tested for the following markers: CA125 Results of the tumor marker test revealed 8      05/12/2014 - 09/01/2014 Chemotherapy    She received adjuvant chemotherapy with carbo/taxol for 6 cycles      05/12/2014 Genetic Testing    Genetics testing:OvaNext panel 05-12-14 identified a variant of uncertain significance, RAD51D, p.T103A      06/01/2014 Tumor Marker    Patient's tumor was tested for the following markers: CA125 Results of the tumor marker test revealed 10      06/27/2014 Tumor Marker    Patient's tumor was tested for the following markers: CA125 Results of the tumor marker test revealed 5      08/10/2014 Tumor Marker    .Patient's tumor was tested for the following markers: CA125 Results of the tumor marker test revealed 6      09/14/2014 Imaging    Segment of circumferential bowel wall narrowing at the junction of the sigmoid colon and rectum is concerning for a serosal metastasis with subsequent constriction of the bowel lumen. There is a moderate volume of stool throughout the colon suggesting some degree of early obstruction. There is stool in the rectum. 2. Status post hysterectomy and resection of the cystic ovarian masses. 3. Three nodules adherent to the transverse colon are  concerning for serosal metastasis. 4. Subcutaneous thickening at the umbilicus likely related to surgery.      09/14/2014 Tumor Marker    Patient's tumor was tested for the following markers: CA125 Results of the tumor marker test revealed 5      09/27/2014 PET scan    No abnormal hypermetabolism above normal colon at the rectosigmoid junction, at the site of suspected mass in narrowing on 09/14/2014. 2. Small soft tissue nodules along the distal transverse colon do not show abnormal hypermetabolism but may be too small for PET resolution.      10/05/2014 Procedure    She underwent sigmoid colon biopsy      10/05/2014 Pathology Results    Sigmoid colon biopsy revealed acute colitis, nonspecific      10/18/2014 Tumor Marker    Patient's tumor was tested for the following markers: CA125 Results of the tumor marker test revealed 5      01/08/2015 Tumor Marker    Patient's tumor was tested for the following markers: CA125 Results of the tumor marker test revealed 4      04/04/2015 Tumor Marker    Patient's tumor was tested for the following markers: CA125 Results of the tumor marker test revealed 6.9      07/12/2015 Tumor Marker    Patient's tumor was tested for the following markers: CA125 Results of the tumor marker test revealed 7.5      10/10/2015 Tumor Marker    Patient's tumor was tested for the following markers: CA125 Results of the tumor marker test revealed 11.4      11/05/2015  Tumor Marker    Patient's tumor was tested for the following markers: CA125 Results of the tumor marker test revealed 18.4      11/19/2015 Imaging    1. 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 2. New large subcapsular metastatic masses along LEFT and RIGHT hepatic lobes and positioned between the diaphragm and the RIGHT hepatic lobe. 3. Peritoneal metastasis with new nodules within the mesentery of the small bowel and colon. 4. Potential serosal  implant within the proximal sigmoid colon. No bowel obstruction 5. Small amount of ascites is new from prior. 6. Mild pelvic lymphadenopathy consistent with  metastatic disease      11/30/2015 Tumor Marker    Patient's tumor was tested for the following markers: CA125 Results of the tumor marker test revealed 32.4      12/04/2015 Procedure    Ultrasound and fluoroscopically guided right internal jugular single lumen power port catheter insertion. Tip in the SVC/RA junction. Catheter ready for use      12/06/2015 - 12/28/2015 Chemotherapy    She received 2 cycles of carbo/taxol, DC due to disease progression      12/21/2015 Tumor Marker    Patient's tumor was tested for the following markers: CA125 Results of the tumor marker test revealed 48.4      01/14/2016 Tumor Marker    Patient's tumor was tested for the following markers: CA125 Results of the tumor marker test revealed 74.6      01/16/2016 Imaging    Prominent interval progression of metastatic disease in the abdomen and pelvis. Large capsular lesions along the liver are associated with enlarging and new omental, mesenteric and peritoneal nodules and masses throughout the abdomen and pelvis. 2. Interval progression of intraperitoneal free fluid. 3. Interval development of new small left pleural effusion. 4. Lesion involving the sigmoid colon has progressed in the interval. No evidence for colonic obstruction at this time.      01/16/2016 Imaging    LV EF: 55% -   60%      01/25/2016 - 05/02/2016 Chemotherapy    She received Avastin with Doxil. Avastin was discontinued when she developed massive PE.  Doxil was stopped due to disease progression       02/16/2016 Imaging    Massive bilateral occlusive pulmonary emboli. CT evidence of right heart strain (RV/LV Ratio = 1.46) consistent with at least submassive (intermediate risk) PE. The presence of right heart strain has been associated with an increased risk of morbidity and  mortality. 2. Large layering RIGHT pleural effusion. Concern for malignant effusion. 3. New large mass in the gastrohepatic ligament (6 cm) concerning for progression of ovarian carcinoma.      02/17/2016 - 02/21/2016 Hospital Admission    She was admitted to the hospital and was found to have DVT and severe PE with heart strain. She was discharged home on Lovenox      02/18/2016 Imaging    Normal left ventricular size and systolic function. Severely dilated right ventricle with moderately decreased systolic function and severe pulmonary hypertension suspicious for pulmonary embolism.      02/18/2016 Imaging    Findings consistent with acute deep vein thrombosis involving the popliteal and distal femoral veins of the left lower extremity.       02/25/2016 Tumor Marker    Patient's tumor was tested for the following markers: CA125 Results of the tumor marker test revealed 89.5      03/25/2016 Imaging    No  acute findings within the abdomen or pelvis. 2. Large volume of peritoneal tumor is identified within the abdomen and pelvis. This is stable to slightly increased in size in the interval. 3. Decrease in volume of ascites 4. Left pleural effusion is slightly increased in volume from previous exam.      03/28/2016 Adverse Reaction    She received 2 units of blood transfusion      04/04/2016 Tumor Marker    Patient's tumor was tested for the following markers: CA125 Results of the tumor marker test revealed 71.2      05/02/2016 Tumor Marker    Patient's tumor was tested for the following markers: CA125 Results of the tumor marker test revealed 58.2      05/22/2016 Imaging    Left ventricle: The cavity size was normal. Systolic function was normal. Wall motion was normal; there were no regional wall motion abnormalities. Doppler parameters are consistent with abnormal left ventricular relaxation (grade 1 diastolic dysfunction).      05/28/2016 Imaging    CT: 1. Interval increased burden  of bulky peritoneal carcinomatosis as detailed. Small volume malignant ascites is increased. 2. Stable mild right pelvic adenopathy. 3. Moderate dependent left pleural effusion is increased. 4. Near complete resolution of previously visualized pulmonary emboli as detailed . 5. Stable moderate umbilical hernia containing small bowel loops, with no bowel complication.      05/28/2016 Tumor Marker    Patient's tumor was tested for the following markers: CA125 Results of the tumor marker test revealed 44.5       INTERVAL HISTORY: Please see below for problem oriented charting. She is seen today with her husband. She just returned from [place] after helping her daughter to move She complain of significant fatigue, shortness of breath and palpitation The patient denies any recent signs or symptoms of bleeding such as spontaneous epistaxis, hematuria or hematochezia. Her appetite is poor.  She had mild constipation.  She denies pain. She is waiting for telephone calls related to enrollment in clinical trial. She is not enthusiastic to relocate to [place] to start palliative treatment there  REVIEW OF SYSTEMS:   Constitutional: Denies fevers, chills or abnormal weight loss Eyes: Denies blurriness of vision Ears, nose, mouth, throat, and face: Denies mucositis or sore throat Respiratory: Denies cough, dyspnea or wheezes Cardiovascular: Denies palpitation, chest discomfort or lower extremity swelling Skin: Denies abnormal skin rashes Lymphatics: Denies new lymphadenopathy or easy bruising Neurological:Denies numbness, tingling or new weaknesses Behavioral/Psych: Mood is stable, no new changes  All other systems were reviewed with the patient and are negative.  I have reviewed the past medical history, past surgical history, social history and family history with the patient and they are unchanged from previous note.  ALLERGIES:  is allergic to sulfa antibiotics.  MEDICATIONS:  Current  Outpatient Prescriptions  Medication Sig Dispense Refill  . acetaminophen (TYLENOL) 500 MG tablet Take 1,000 mg by mouth every 6 (six) hours as needed.    . chlorpheniramine (CHLOR-TRIMETON) 4 MG tablet Take 4 mg by mouth 2 (two) times daily as needed for allergies.    Marland Kitchen enoxaparin (LOVENOX) 80 MG/0.8ML injection Inject 0.8 mLs (80 mg total) into the skin every 12 (twelve) hours. 60 Syringe 9  . famotidine (PEPCID) 20 MG tablet One at bedtime 30 tablet 2  . fluticasone (FLONASE) 50 MCG/ACT nasal spray Place 1 spray into both nostrils daily as needed. (Patient taking differently: Place 1 spray into both nostrils daily. ) 16 g 0  .  HYDROcodone-homatropine (HYCODAN) 5-1.5 MG/5ML syrup TAKE 5 MLS BY MOUTH EVERY SIX HOURS AS NEEDED FOR COUGH  0  . HYDROmorphone (DILAUDID) 2 MG tablet Take 1 tablet (2 mg total) by mouth every 4 (four) hours as needed for severe pain. 30 tablet 0  . lidocaine-prilocaine (EMLA) cream Apply 1-2 hours prior to [facility] access as directed. 30 g 1  . LORazepam (ATIVAN) 0.5 MG tablet Take 1 tablet (0.5 mg total) by mouth every 6 (six) hours as needed for anxiety. 60 tablet 0  . omeprazole (PRILOSEC) 20 MG capsule Take 2 tablets x 30-60 min before first meal of the day    . ondansetron (ZOFRAN) 8 MG tablet Take 1 tablet (8 mg total) by mouth every 8 (eight) hours as needed for nausea (Will not make drowsy). 30 tablet 2  . polyethylene glycol (MIRALAX / GLYCOLAX) packet Take 17 g by mouth daily as needed.     . rivaroxaban (XARELTO) 20 MG TABS tablet Take 1 tablet (20 mg total) by mouth daily with supper. 30 tablet 9  . mirtazapine (REMERON) 30 MG tablet Take 1 tablet (30 mg total) by mouth at bedtime. 30 tablet 1   No current facility-administered medications for this visit.    Facility-Administered Medications Ordered in Other Visits  Medication Dose Route Frequency Provider Last Rate Last Dose  . ferumoxytol (FERAHEME) 510 mg in sodium chloride 0.9 % 100 mL IVPB  510 mg  Intravenous Once Livesay, [name], MD        PHYSICAL EXAMINATION: ECOG PERFORMANCE STATUS: 2 - Symptomatic, <50% confined to bed  Vitals:   06/30/16 0938  BP: 117/65  Pulse: (!) 107  Resp: 18  Temp: 97.8 F (36.6 C)   Filed Weights   06/30/16 0938  Weight: 161 lb 8 oz (73.3 kg)    GENERAL:alert, no distress and comfortable SKIN: skin color, texture, turgor are normal, no rashes or significant lesions EYES: normal, Conjunctiva are pink and non-injected, sclera clear OROPHARYNX:no exudate, no erythema and lips, buccal mucosa, and tongue normal  NECK: supple, thyroid normal size, non-tender, without nodularity LYMPH:  no palpable lymphadenopathy in the cervical, axillary or inguinal LUNGS: clear to auscultation and percussion with normal breathing effort HEART: Noted tachycardia, no murmurs, no lower extremity edema ABDOMEN:abdomen soft, non-tender and normal bowel sounds Musculoskeletal:no cyanosis of digits and no clubbing  NEURO: alert & oriented x 3 with fluent speech, no focal motor/sensory deficits  LABORATORY DATA:  I have reviewed the data as listed    Component Value Date/Time   NA 139 06/30/2016 0914   K 4.4 06/30/2016 0914   CL 109 02/20/2016 0531   CO2 23 06/30/2016 0914   GLUCOSE 101 06/30/2016 0914   BUN 35.0 (H) 06/30/2016 0914   CREATININE 1.5 (H) 06/30/2016 0914   CALCIUM 10.6 (H) 06/30/2016 0914   PROT 8.4 (H) 06/30/2016 0914   ALBUMIN 2.9 (L) 06/30/2016 0914   AST 14 06/30/2016 0914   ALT 10 06/30/2016 0914   ALKPHOS 96 06/30/2016 0914   BILITOT 0.59 06/30/2016 0914   GFRNONAA 39 (L) 02/20/2016 0531   GFRAA 45 (L) 02/20/2016 0531    No results found for: SPEP, UPEP  Lab Results  Component Value Date   WBC 8.4 06/30/2016   NEUTROABS 6.0 06/30/2016   HGB 8.1 (L) 06/30/2016   HCT 25.8 (L) 06/30/2016   MCV 90.8 06/30/2016   PLT 318 06/30/2016      Chemistry      Component Value Date/Time  NA 139 06/30/2016 0914   K 4.4 06/30/2016  0914   CL 109 02/20/2016 0531   CO2 23 06/30/2016 0914   BUN 35.0 (H) 06/30/2016 0914   CREATININE 1.5 (H) 06/30/2016 0914      Component Value Date/Time   CALCIUM 10.6 (H) 06/30/2016 0914   ALKPHOS 96 06/30/2016 0914   AST 14 06/30/2016 0914   ALT 10 06/30/2016 0914   BILITOT 0.59 06/30/2016 0914     ASSESSMENT & PLAN:  Ovarian cancer, bilateral ([place]) The patient is waiting for phone call from [facility] of health regarding clinical trial Additional testings sent by her GYN oncologist are pending We discussed again current guidelines including the risks, benefits, side effects of palliative chemotherapy with gemcitabine We discussed the roles of treatment and expected benefits and side effects of treatment The patient would like to proceed with blood transfusion tomorrow. If she does not want to pursue clinical trial, I will start her on chemotherapy next week.  Acute saddle pulmonary embolism with acute cor pulmonale (HCC) She will continue on Xarelto, to continue indefinitely. Echocardiogram reviewed preserved ejection fraction   Anemia due to antineoplastic chemotherapy We discussed some of the risks, benefits, and alternatives of blood transfusions. The patient is symptomatic from anemia and the hemoglobin level is critically low.  Some of the side-effects to be expected including risks of transfusion reactions, chills, infection, syndrome of volume overload and risk of hospitalization from various reasons and the patient is willing to proceed and went ahead to sign consent today. I plan to give her 2 units of blood tomorrow The plan would be to keep her hemoglobin greater than 8 g  CKD (chronic kidney disease) stage 3, GFR 30-59 ml/min She has mild acute renal failure, likely due to hypoperfusion from severe anemia Recommend increase fluid hydration  Hypercalcemia She has mild hypercalcemia, could be due to dehydration and untreated cancer Again, recommend  increase oral fluid hydration and will recheck again next week  Goals of care, counseling/discussion I have extensive goals of care discussion with the patient and her husband today.  Unfortunately, none of the other physicians have a long discussion with them about expectation. The patient is aware she has incurable disease and treatment is strictly palliative. We discussed importance of Advanced Directives and Living will. We discussed CODE STATUS; the patient desires to remain in full code.  Malignant cachexia ([place]) The patient is started on mirtazapine.   No orders of the defined types were placed in this encounter.  All questions were answered. The patient knows to call the clinic with any problems, questions or concerns. No barriers to learning was detected. I spent 30 minutes counseling the patient face to face. The total time spent in the appointment was 55 minutes and more than 50% was on counseling and review of test results     Heath Lark, MD 06/30/2016 4:20 PM

## 2016-06-30 NOTE — Assessment & Plan Note (Signed)
She will continue on Xarelto, to continue indefinitely. Echocardiogram reviewed preserved ejection fraction

## 2016-06-30 NOTE — Assessment & Plan Note (Signed)
We discussed some of the risks, benefits, and alternatives of blood transfusions. The patient is symptomatic from anemia and the hemoglobin level is critically low.  Some of the side-effects to be expected including risks of transfusion reactions, chills, infection, syndrome of volume overload and risk of hospitalization from various reasons and the patient is willing to proceed and went ahead to sign consent today. I plan to give her 2 units of blood tomorrow The plan would be to keep her hemoglobin greater than 8 g

## 2016-06-30 NOTE — Assessment & Plan Note (Signed)
The patient is waiting for phone call from [facility] of health regarding clinical trial Additional testings sent by her GYN oncologist are pending We discussed again current guidelines including the risks, benefits, side effects of palliative chemotherapy with gemcitabine We discussed the roles of treatment and expected benefits and side effects of treatment The patient would like to proceed with blood transfusion tomorrow. If she does not want to pursue clinical trial, I will start her on chemotherapy next week.

## 2016-06-30 NOTE — Telephone Encounter (Signed)
Gave patient AVS and Calender per 6/4 LOS.

## 2016-06-30 NOTE — Assessment & Plan Note (Signed)
The patient is started on mirtazapine.

## 2016-06-30 NOTE — Progress Notes (Signed)
DISCONTINUE OFF PATHWAY REGIMEN - Ovarian     A cycle is every 28 days:     Liposomal Doxorubicin        Dose Mod: None     Bevacizumab        Dose Mod: None  **Always confirm dose/schedule in your pharmacy ordering system**    REASON: Disease Progression PRIOR TREATMENT: [id number]: Liposomal Doxorubicin (Doxil) 40 mg/m2 D1 + Bevacizumab 10 mg/kg D1, 15 q28 Days; Re-evaluate Every 3 Cycles, Treat until Complete Response, Unacceptable Toxicity, or Disease Progression TREATMENT RESPONSE: Progressive Disease (PD)  START OFF PATHWAY REGIMEN - Ovarian   OFF00015:Gemcitabine 1,000 mg/m2 Days 1, 8, 15 q28 Days:   A cycle is every 28 days (3 weeks on and 1 week off):     Gemcitabine   **Always confirm dose/schedule in your pharmacy ordering system**    Patient Characteristics: Third Line Therapy, Platinum Resistant or < 6 Months Since Last Platinum Therapy AJCC T Category: T3 AJCC N Category: N1 AJCC M Category: M0 AJCC 8 Stage Grouping: Unknown Line of Therapy: Third Line Therapy BRCA Mutation Status: Absent Would you be surprised if this patient died  in the next year? I would be surprised if this patient died in the next year  Intent of Therapy: Non-Curative / Palliative Intent, Discussed with Patient

## 2016-06-30 NOTE — Assessment & Plan Note (Signed)
I have extensive goals of care discussion with the patient and her husband today.  Unfortunately, none of the other physicians have a long discussion with them about expectation. The patient is aware she has incurable disease and treatment is strictly palliative. We discussed importance of Advanced Directives and Living will. We discussed CODE STATUS; the patient desires to remain in full code.

## 2016-07-01 ENCOUNTER — Ambulatory Visit (HOSPITAL_COMMUNITY)
Admission: RE | Admit: 2016-07-01 | Discharge: 2016-07-01 | Disposition: A | Discharge status: Home / Self Care | Payer: Managed Care, Other (non HMO) | Source: Ambulatory Visit | Attending: Hematology and Oncology | Admitting: Hematology and Oncology

## 2016-07-01 DIAGNOSIS — D6481 Anemia due to antineoplastic chemotherapy: Secondary | ICD-10-CM | POA: Diagnosis not present

## 2016-07-01 DIAGNOSIS — T451X5A Adverse effect of antineoplastic and immunosuppressive drugs, initial encounter: Principal | ICD-10-CM

## 2016-07-01 LAB — PREPARE RBC (CROSSMATCH)

## 2016-07-01 MED ORDER — DIPHENHYDRAMINE HCL 25 MG PO CAPS
25.0000 mg | ORAL_CAPSULE | Freq: Once | ORAL | Status: AC
  Administered 2016-07-01: 25 mg via ORAL
  Filled 2016-07-01: qty 1

## 2016-07-01 MED ORDER — HEPARIN SOD (PORK) LOCK FLUSH 100 UNIT/ML IV SOLN
500.0000 [IU] | Freq: Every day | INTRAVENOUS | Status: AC | PRN
  Administered 2016-07-01: 500 [IU]
  Filled 2016-07-01: qty 5

## 2016-07-01 MED ORDER — ACETAMINOPHEN 325 MG PO TABS
650.0000 mg | ORAL_TABLET | Freq: Once | ORAL | Status: AC
  Administered 2016-07-01: 650 mg via ORAL
  Filled 2016-07-01: qty 2

## 2016-07-01 MED ORDER — HEPARIN SOD (PORK) LOCK FLUSH 100 UNIT/ML IV SOLN: 250.0000 [IU] | INTRAVENOUS | Status: DC | PRN

## 2016-07-01 MED ORDER — SODIUM CHLORIDE 0.9% FLUSH
10.0000 mL | INTRAVENOUS | Status: AC | PRN
  Administered 2016-07-01: 10 mL

## 2016-07-01 MED ORDER — SODIUM CHLORIDE 0.9% FLUSH: 3.0000 mL | INTRAVENOUS | Status: DC | PRN

## 2016-07-01 MED ORDER — SODIUM CHLORIDE 0.9 % IV SOLN
250.0000 mL | Freq: Once | INTRAVENOUS | Status: AC
  Administered 2016-07-01: 250 mL via INTRAVENOUS

## 2016-07-01 NOTE — Discharge Instructions (Signed)

## 2016-07-01 NOTE — Progress Notes (Signed)
Provider: Natale Lay  Procedure: 2 units PRBC's via port a cath  Diagnosis Association: Anemia due to antineoplastic chemotherapy (D64.81, T45.1X5A)   Treatment: Patient received 2 units PRBC's via port a cath. Patient tolerated procedure well with no transfusion reaction. Discharge instructions given to patient and patient states an understanding. Patient alert, oriented, and ambulatory at time of discharge.

## 2016-07-02 LAB — BPAM RBC
Blood Product Expiration Date: 201806202359
Blood Product Expiration Date: 201806242359
ISSUE DATE / TIME: 201806050919
ISSUE DATE / TIME: 201806050919
Unit Type and Rh: 7300
Unit Type and Rh: 7300

## 2016-07-02 LAB — TYPE AND SCREEN
ABO/RH(D): B POS
Antibody Screen: NEGATIVE
UNIT DIVISION: 0
UNIT DIVISION: 0

## 2016-07-08 ENCOUNTER — Ambulatory Visit: Payer: Managed Care, Other (non HMO)

## 2016-07-08 ENCOUNTER — Ambulatory Visit (HOSPITAL_BASED_OUTPATIENT_CLINIC_OR_DEPARTMENT_OTHER): Payer: Managed Care, Other (non HMO)

## 2016-07-08 ENCOUNTER — Other Ambulatory Visit (HOSPITAL_BASED_OUTPATIENT_CLINIC_OR_DEPARTMENT_OTHER): Payer: Managed Care, Other (non HMO)

## 2016-07-08 VITALS — BP 127/85 | HR 114 | Temp 98.9°F | Resp 17

## 2016-07-08 DIAGNOSIS — C562 Malignant neoplasm of left ovary: Secondary | ICD-10-CM

## 2016-07-08 DIAGNOSIS — C561 Malignant neoplasm of right ovary: Secondary | ICD-10-CM

## 2016-07-08 DIAGNOSIS — Z5111 Encounter for antineoplastic chemotherapy: Secondary | ICD-10-CM

## 2016-07-08 DIAGNOSIS — Z95828 Presence of other vascular implants and grafts: Secondary | ICD-10-CM

## 2016-07-08 DIAGNOSIS — C563 Malignant neoplasm of bilateral ovaries: Secondary | ICD-10-CM

## 2016-07-08 DIAGNOSIS — C569 Malignant neoplasm of unspecified ovary: Secondary | ICD-10-CM

## 2016-07-08 LAB — CBC WITH DIFFERENTIAL/PLATELET
BASO%: 0.9 % (ref 0.0–2.0)
BASOS ABS: 0.1 10*3/uL (ref 0.0–0.1)
EOS ABS: 0 10*3/uL (ref 0.0–0.5)
EOS%: 0.5 % (ref 0.0–7.0)
HEMATOCRIT: 28.5 % — AB (ref 34.8–46.6)
HEMOGLOBIN: 9.5 g/dL — AB (ref 11.6–15.9)
LYMPH#: 1.5 10*3/uL (ref 0.9–3.3)
LYMPH%: 16.8 % (ref 14.0–49.7)
MCH: 29.1 pg (ref 25.1–34.0)
MCHC: 33.4 g/dL (ref 31.5–36.0)
MCV: 87.2 fL (ref 79.5–101.0)
MONO#: 0.8 10*3/uL (ref 0.1–0.9)
MONO%: 9.7 % (ref 0.0–14.0)
NEUT#: 6.3 10*3/uL (ref 1.5–6.5)
NEUT%: 72.1 % (ref 38.4–76.8)
PLATELETS: 306 10*3/uL (ref 145–400)
RBC: 3.27 10*6/uL — ABNORMAL LOW (ref 3.70–5.45)
RDW: 16.6 % — ABNORMAL HIGH (ref 11.2–14.5)
WBC: 8.8 10*3/uL (ref 3.9–10.3)

## 2016-07-08 LAB — COMPREHENSIVE METABOLIC PANEL
ALBUMIN: 2.7 g/dL — AB (ref 3.5–5.0)
ALK PHOS: 108 U/L (ref 40–150)
ALT: 8 U/L (ref 0–55)
ANION GAP: 11 meq/L (ref 3–11)
AST: 15 U/L (ref 5–34)
BUN: 25.5 mg/dL (ref 7.0–26.0)
CALCIUM: 10.2 mg/dL (ref 8.4–10.4)
CHLORIDE: 102 meq/L (ref 98–109)
CO2: 24 mEq/L (ref 22–29)
Creatinine: 1.2 mg/dL — ABNORMAL HIGH (ref 0.6–1.1)
EGFR: 61 mL/min/{1.73_m2} — ABNORMAL LOW (ref >90)
Glucose: 101 mg/dl (ref 70–140)
POTASSIUM: 4.4 meq/L (ref 3.5–5.1)
Sodium: 137 mEq/L (ref 136–145)
Total Bilirubin: 0.6 mg/dL (ref 0.20–1.20)
Total Protein: 8.2 g/dL (ref 6.4–8.3)

## 2016-07-08 MED ORDER — SODIUM CHLORIDE 0.9 % IV SOLN
800.0000 mg/m2 | Freq: Once | INTRAVENOUS | Status: AC
  Administered 2016-07-08: 1482 mg via INTRAVENOUS
  Filled 2016-07-08: qty 38.98

## 2016-07-08 MED ORDER — SODIUM CHLORIDE 0.9% FLUSH
10.0000 mL | Freq: Once | INTRAVENOUS | Status: AC
  Administered 2016-07-08: 10 mL
  Filled 2016-07-08: qty 10

## 2016-07-08 MED ORDER — SODIUM CHLORIDE 0.9% FLUSH
10.0000 mL | INTRAVENOUS | Status: DC | PRN
  Administered 2016-07-08: 10 mL
  Filled 2016-07-08: qty 10

## 2016-07-08 MED ORDER — PROCHLORPERAZINE MALEATE 10 MG PO TABS
10.0000 mg | ORAL_TABLET | Freq: Once | ORAL | Status: AC
  Administered 2016-07-08: 10 mg via ORAL

## 2016-07-08 MED ORDER — PROCHLORPERAZINE MALEATE 10 MG PO TABS
ORAL_TABLET | ORAL | Status: AC
  Filled 2016-07-08: qty 1

## 2016-07-08 MED ORDER — HEPARIN SOD (PORK) LOCK FLUSH 100 UNIT/ML IV SOLN
500.0000 [IU] | Freq: Once | INTRAVENOUS | Status: AC | PRN
  Administered 2016-07-08: 500 [IU]
  Filled 2016-07-08: qty 5

## 2016-07-08 MED ORDER — SODIUM CHLORIDE 0.9 % IV SOLN
Freq: Once | INTRAVENOUS | Status: AC
  Administered 2016-07-08: 15:00:00 via INTRAVENOUS

## 2016-07-08 NOTE — Patient Instructions (Addendum)

## 2016-07-08 NOTE — Progress Notes (Signed)
Ok to treat with HR today per MD Alen Blew - patient reports feeling fine

## 2016-07-09 ENCOUNTER — Telehealth: Payer: Self-pay

## 2016-07-09 NOTE — Telephone Encounter (Signed)
-----   Message from Azzie Glatter, RN sent at 07/08/2016  4:32 PM EDT ----- Regarding: "1st time chemotherapy...per Dr. Alvy Bimler" Patient received Gemzar for the 1st time today, per Dr. Alvy Bimler.  Tolerated treatment well with no complaints.

## 2016-07-09 NOTE — Telephone Encounter (Signed)
Called regarding below message. Patient states that she is doing well so far. Instructed to call for problems.

## 2016-07-14 ENCOUNTER — Other Ambulatory Visit: Payer: Self-pay | Admitting: Hematology and Oncology

## 2016-07-14 DIAGNOSIS — T451X5A Adverse effect of antineoplastic and immunosuppressive drugs, initial encounter: Secondary | ICD-10-CM

## 2016-07-14 DIAGNOSIS — C563 Malignant neoplasm of bilateral ovaries: Secondary | ICD-10-CM

## 2016-07-14 DIAGNOSIS — D6481 Anemia due to antineoplastic chemotherapy: Secondary | ICD-10-CM

## 2016-07-14 DIAGNOSIS — C561 Malignant neoplasm of right ovary: Secondary | ICD-10-CM

## 2016-07-14 DIAGNOSIS — C562 Malignant neoplasm of left ovary: Principal | ICD-10-CM

## 2016-07-15 ENCOUNTER — Other Ambulatory Visit (HOSPITAL_BASED_OUTPATIENT_CLINIC_OR_DEPARTMENT_OTHER): Payer: Managed Care, Other (non HMO)

## 2016-07-15 ENCOUNTER — Ambulatory Visit (HOSPITAL_BASED_OUTPATIENT_CLINIC_OR_DEPARTMENT_OTHER): Payer: Managed Care, Other (non HMO)

## 2016-07-15 ENCOUNTER — Telehealth: Payer: Self-pay | Admitting: Hematology and Oncology

## 2016-07-15 ENCOUNTER — Ambulatory Visit (HOSPITAL_BASED_OUTPATIENT_CLINIC_OR_DEPARTMENT_OTHER): Payer: Managed Care, Other (non HMO) | Admitting: Hematology and Oncology

## 2016-07-15 ENCOUNTER — Ambulatory Visit: Payer: Managed Care, Other (non HMO)

## 2016-07-15 VITALS — BP 126/75 | HR 96 | Temp 97.8°F | Resp 16

## 2016-07-15 VITALS — BP 117/75 | HR 108 | Temp 97.6°F | Resp 18 | Ht 65.0 in | Wt 159.0 lb

## 2016-07-15 DIAGNOSIS — E440 Moderate protein-calorie malnutrition: Secondary | ICD-10-CM | POA: Diagnosis not present

## 2016-07-15 DIAGNOSIS — C569 Malignant neoplasm of unspecified ovary: Secondary | ICD-10-CM

## 2016-07-15 DIAGNOSIS — R64 Cachexia: Secondary | ICD-10-CM

## 2016-07-15 DIAGNOSIS — C563 Malignant neoplasm of bilateral ovaries: Secondary | ICD-10-CM

## 2016-07-15 DIAGNOSIS — T451X5A Adverse effect of antineoplastic and immunosuppressive drugs, initial encounter: Secondary | ICD-10-CM

## 2016-07-15 DIAGNOSIS — C562 Malignant neoplasm of left ovary: Principal | ICD-10-CM

## 2016-07-15 DIAGNOSIS — C561 Malignant neoplasm of right ovary: Secondary | ICD-10-CM

## 2016-07-15 DIAGNOSIS — D6481 Anemia due to antineoplastic chemotherapy: Secondary | ICD-10-CM

## 2016-07-15 DIAGNOSIS — Z5111 Encounter for antineoplastic chemotherapy: Secondary | ICD-10-CM

## 2016-07-15 DIAGNOSIS — R110 Nausea: Secondary | ICD-10-CM

## 2016-07-15 LAB — COMPREHENSIVE METABOLIC PANEL
ALBUMIN: 2.9 g/dL — AB (ref 3.5–5.0)
ALK PHOS: 84 U/L (ref 40–150)
ALT: 9 U/L (ref 0–55)
AST: 14 U/L (ref 5–34)
Anion Gap: 12 mEq/L — ABNORMAL HIGH (ref 3–11)
BILIRUBIN TOTAL: 0.48 mg/dL (ref 0.20–1.20)
BUN: 20.1 mg/dL (ref 7.0–26.0)
CALCIUM: 10.1 mg/dL (ref 8.4–10.4)
CHLORIDE: 102 meq/L (ref 98–109)
CO2: 25 mEq/L (ref 22–29)
CREATININE: 1.3 mg/dL — AB (ref 0.6–1.1)
EGFR: 55 mL/min/{1.73_m2} — ABNORMAL LOW (ref >90)
Glucose: 111 mg/dl (ref 70–140)
Potassium: 4.3 mEq/L (ref 3.5–5.1)
Sodium: 139 mEq/L (ref 136–145)
TOTAL PROTEIN: 7.9 g/dL (ref 6.4–8.3)

## 2016-07-15 LAB — CBC WITH DIFFERENTIAL/PLATELET
BASO%: 0.5 % (ref 0.0–2.0)
Basophils Absolute: 0 10*3/uL (ref 0.0–0.1)
EOS%: 0.3 % (ref 0.0–7.0)
Eosinophils Absolute: 0 10*3/uL (ref 0.0–0.5)
HEMATOCRIT: 22.5 % — AB (ref 34.8–46.6)
HEMOGLOBIN: 7.6 g/dL — AB (ref 11.6–15.9)
LYMPH#: 1.3 10*3/uL (ref 0.9–3.3)
LYMPH%: 27.6 % (ref 14.0–49.7)
MCH: 29.3 pg (ref 25.1–34.0)
MCHC: 33.7 g/dL (ref 31.5–36.0)
MCV: 87 fL (ref 79.5–101.0)
MONO#: 0.5 10*3/uL (ref 0.1–0.9)
MONO%: 11.2 % (ref 0.0–14.0)
NEUT#: 2.9 10*3/uL (ref 1.5–6.5)
NEUT%: 60.4 % (ref 38.4–76.8)
PLATELETS: 146 10*3/uL (ref 145–400)
RBC: 2.58 10*6/uL — ABNORMAL LOW (ref 3.70–5.45)
RDW: 15.7 % — AB (ref 11.2–14.5)
WBC: 4.9 10*3/uL (ref 3.9–10.3)

## 2016-07-15 LAB — PREPARE RBC (CROSSMATCH)

## 2016-07-15 MED ORDER — HEPARIN SOD (PORK) LOCK FLUSH 100 UNIT/ML IV SOLN
500.0000 [IU] | Freq: Every day | INTRAVENOUS | Status: AC | PRN
  Administered 2016-07-15: 500 [IU]
  Filled 2016-07-15: qty 5

## 2016-07-15 MED ORDER — SODIUM CHLORIDE 0.9 % IV SOLN
800.0000 mg/m2 | Freq: Once | INTRAVENOUS | Status: AC
  Administered 2016-07-15: 1482 mg via INTRAVENOUS
  Filled 2016-07-15: qty 38.98

## 2016-07-15 MED ORDER — SODIUM CHLORIDE 0.9% FLUSH
10.0000 mL | INTRAVENOUS | Status: DC | PRN
  Filled 2016-07-15: qty 10

## 2016-07-15 MED ORDER — PROCHLORPERAZINE MALEATE 10 MG PO TABS
ORAL_TABLET | ORAL | Status: AC
  Filled 2016-07-15: qty 1

## 2016-07-15 MED ORDER — SODIUM CHLORIDE 0.9 % IV SOLN
250.0000 mL | Freq: Once | INTRAVENOUS | Status: AC
  Administered 2016-07-15: 250 mL via INTRAVENOUS

## 2016-07-15 MED ORDER — SODIUM CHLORIDE 0.9 % IV SOLN: 250.0000 mL | Freq: Once | INTRAVENOUS | Status: DC

## 2016-07-15 MED ORDER — PROCHLORPERAZINE MALEATE 10 MG PO TABS
10.0000 mg | ORAL_TABLET | Freq: Once | ORAL | Status: AC
  Administered 2016-07-15: 10 mg via ORAL

## 2016-07-15 MED ORDER — SODIUM CHLORIDE 0.9% FLUSH
10.0000 mL | INTRAVENOUS | Status: AC | PRN
  Administered 2016-07-15: 10 mL
  Filled 2016-07-15: qty 10

## 2016-07-15 MED ORDER — ACETAMINOPHEN 325 MG PO TABS
ORAL_TABLET | ORAL | Status: AC
  Filled 2016-07-15: qty 2

## 2016-07-15 MED ORDER — SODIUM CHLORIDE 0.9% FLUSH
3.0000 mL | INTRAVENOUS | Status: DC | PRN
  Filled 2016-07-15: qty 10

## 2016-07-15 MED ORDER — HEPARIN SOD (PORK) LOCK FLUSH 100 UNIT/ML IV SOLN
500.0000 [IU] | Freq: Once | INTRAVENOUS | Status: DC | PRN
Start: 2016-07-15 — End: 2016-07-15
  Filled 2016-07-15: qty 5

## 2016-07-15 MED ORDER — SODIUM CHLORIDE 0.9 % IV SOLN
Freq: Once | INTRAVENOUS | Status: AC
Start: 2016-07-15 — End: 2016-07-15
  Administered 2016-07-15: 11:00:00 via INTRAVENOUS

## 2016-07-15 MED ORDER — ACETAMINOPHEN 325 MG PO TABS
650.0000 mg | ORAL_TABLET | Freq: Once | ORAL | Status: AC
  Administered 2016-07-15: 650 mg via ORAL

## 2016-07-15 MED ORDER — DIPHENHYDRAMINE HCL 25 MG PO CAPS
ORAL_CAPSULE | ORAL | Status: AC
  Filled 2016-07-15: qty 1

## 2016-07-15 MED ORDER — HEPARIN SOD (PORK) LOCK FLUSH 100 UNIT/ML IV SOLN
250.0000 [IU] | INTRAVENOUS | Status: DC | PRN
  Filled 2016-07-15: qty 5

## 2016-07-15 MED ORDER — DIPHENHYDRAMINE HCL 25 MG PO CAPS
25.0000 mg | ORAL_CAPSULE | Freq: Once | ORAL | Status: AC
  Administered 2016-07-15: 25 mg via ORAL

## 2016-07-15 MED ORDER — PROCHLORPERAZINE MALEATE 10 MG PO TABS: 10.0000 mg | ORAL_TABLET | Freq: Four times a day (QID) | ORAL | 3 refills | Status: AC | PRN

## 2016-07-15 NOTE — Telephone Encounter (Signed)
Scheduled appt per 6/19 los - Gave patient AVS and calender per LOS .  

## 2016-07-15 NOTE — Patient Instructions (Addendum)
[facility] Discharge Instructions for Patients Receiving Chemotherapy  Today you received the following chemotherapy agents: Gemzar.  To help prevent nausea and vomiting after your treatment, we encourage you to take your nausea medication   If you develop nausea and vomiting that is not controlled by your nausea medication, call the clinic.   BELOW ARE SYMPTOMS THAT SHOULD BE REPORTED IMMEDIATELY:  *FEVER GREATER THAN 100.5 F  *CHILLS WITH OR WITHOUT FEVER  NAUSEA AND VOMITING THAT IS NOT CONTROLLED WITH YOUR NAUSEA MEDICATION  *UNUSUAL SHORTNESS OF BREATH  *UNUSUAL BRUISING OR BLEEDING  TENDERNESS IN MOUTH AND THROAT WITH OR WITHOUT PRESENCE OF ULCERS  *URINARY PROBLEMS  *BOWEL PROBLEMS  UNUSUAL RASH Items with * indicate a potential emergency and should be followed up as soon as possible.  Feel free to call the clinic you have any questions or concerns. The clinic phone number is [phone].  Please show the [place] [place] at check-in to the Emergency Department and triage nurse.    Blood Transfusion, Adult A blood transfusion is a procedure in which you receive donated blood, including plasma, platelets, and red blood cells, through an IV tube. You may need a blood transfusion because of illness, surgery, or injury. The blood may come from a donor. You may also be able to donate blood for yourself (autologous blood donation) before a surgery if you know that you might require a blood transfusion. The blood given in a transfusion is made up of different types of cells. You may receive:  Red blood cells. These carry oxygen to the cells in the body.  White blood cells. These help you fight infections.  Platelets. These help your blood to clot.  Plasma. This is the liquid part of your blood and it helps with fluid imbalances.  If you have hemophilia or another clotting disorder, you may also receive other types of blood products. Tell a health  care provider about:  Any allergies you have.  All medicines you are taking, including vitamins, herbs, eye drops, creams, and over-the-counter medicines.  Any problems you or family members have had with anesthetic medicines.  Any blood disorders you have.  Any surgeries you have had.  Any medical conditions you have, including any recent fever or cold symptoms.  Whether you are pregnant or may be pregnant.  Any previous reactions you have had during a blood transfusion. What are the risks? Generally, this is a safe procedure. However, problems may occur, including:  Having an allergic reaction to something in the donated blood. Hives and itching may be symptoms of this type of reaction.  Fever. This may be a reaction to the white blood cells in the transfused blood. Nausea or chest pain may accompany a fever.  Iron overload. This can happen from having many transfusions.  Transfusion-related acute lung injury (TRALI). This is a rare reaction that causes lung damage. The cause is not known.TRALI can occur within hours of a transfusion or several days later.  Sudden (acute) or delayed hemolytic reactions. This happens if your blood does not match the cells in your transfusion. Your body's defense system (immune system) may try to attack the new cells. This complication is rare. The symptoms include fever, chills, nausea, and low back pain or chest pain.  Infection or disease transmission. This is rare.  What happens before the procedure?  You will have a blood test to determine your blood type. This is necessary to know what kind of blood your  body will accept and to match it to the donor blood.  If you are going to have a planned surgery, you may be able to do an autologous blood donation. This may be done in case you need to have a transfusion.  If you have had an allergic reaction to a transfusion in the past, you may be given medicine to help prevent a reaction. This  medicine may be given to you by mouth or through an IV tube.  You will have your temperature, blood pressure, and pulse monitored before the transfusion.  Follow instructions from your health care provider about eating and drinking restrictions.  Ask your health care provider about: ? Changing or stopping your regular medicines. This is especially important if you are taking diabetes medicines or blood thinners. ? Taking medicines such as aspirin and ibuprofen. These medicines can thin your blood. Do not take these medicines before your procedure if your health care provider instructs you not to. What happens during the procedure?  An IV tube will be inserted into one of your veins.  The bag of donated blood will be attached to your IV tube. The blood will then enter through your vein.  Your temperature, blood pressure, and pulse will be monitored regularly during the transfusion. This monitoring is done to detect early signs of a transfusion reaction.  If you have any signs or symptoms of a reaction, your transfusion will be stopped and you may be given medicine.  When the transfusion is complete, your IV tube will be removed.  Pressure may be applied to the IV site for a few minutes.  A bandage (dressing) will be applied. The procedure may vary among health care providers and hospitals. What happens after the procedure?  Your temperature, blood pressure, heart rate, breathing rate, and blood oxygen level will be monitored often.  Your blood may be tested to see how you are responding to the transfusion.  You may be warmed with fluids or blankets to maintain a normal body temperature. Summary  A blood transfusion is a procedure in which you receive donated blood, including plasma, platelets, and red blood cells, through an IV tube.  Your temperature, blood pressure, and pulse will be monitored before, during, and after the transfusion.  Your blood may be tested after the  transfusion to see how your body has responded. This information is not intended to replace advice given to you by your health care provider. Make sure you discuss any questions you have with your health care provider. Document Released: 01/11/2000 Document Revised: 10/11/2015 Document Reviewed: 10/11/2015 Elsevier Interactive Patient Education  [employer].

## 2016-07-16 DIAGNOSIS — E440 Moderate protein-calorie malnutrition: Secondary | ICD-10-CM | POA: Insufficient documentation

## 2016-07-16 DIAGNOSIS — R110 Nausea: Secondary | ICD-10-CM | POA: Insufficient documentation

## 2016-07-16 LAB — BPAM RBC
Blood Product Expiration Date: 201807072359
Blood Product Expiration Date: 201807072359
ISSUE DATE / TIME: 201806191259
ISSUE DATE / TIME: 201806191259
UNIT TYPE AND RH: 7300
Unit Type and Rh: 7300

## 2016-07-16 LAB — TYPE AND SCREEN
ABO/RH(D): B POS
ANTIBODY SCREEN: NEGATIVE
UNIT DIVISION: 0
UNIT DIVISION: 0

## 2016-07-16 NOTE — Assessment & Plan Note (Signed)
We discussed the importance of regular intake as tolerated I recommend she takes regular Zofran to reduce the risk of nausea as a cause of her poor appetite She is started on multiple different medications as appetite stimulant

## 2016-07-16 NOTE — Assessment & Plan Note (Signed)
We discussed some of the risks, benefits, and alternatives of blood transfusions. The patient is symptomatic from anemia and the hemoglobin level is critically low.  Some of the side-effects to be expected including risks of transfusion reactions, chills, infection, syndrome of volume overload and risk of hospitalization from various reasons and the patient is willing to proceed and went ahead to sign consent today. Plan to proceed with chemo and will continue transfusion support of 2 units of blood whenever her hemoglobin is less than 8

## 2016-07-16 NOTE — Assessment & Plan Note (Signed)
She has very significant daily nausea especially in the morning She denies constipation She has been taking Zofran on a regular basis She complain of dizziness She denies headache or focal neurological deficits I am concerned about potential metastatic cancer to the brain I recommend MRI imaging to be done this was possible to for evaluation I recommend she take daily Zofran for now

## 2016-07-16 NOTE — Assessment & Plan Note (Signed)
So far, she tolerated chemotherapy well except for nausea and severe anemia I would proceed with treatment without dose adjustment I will continue to provide blood transfusion support as needed I would look into the [facility] clinical trial and give her feedback when I see her back next week

## 2016-07-16 NOTE — Assessment & Plan Note (Signed)
The patient is started on mirtazapine.

## 2016-07-16 NOTE — Progress Notes (Signed)
[place] OFFICE PROGRESS NOTE  Patient Care Team: Caryl Bis, MD as PCP - General (Family Medicine)  SUMMARY OF ONCOLOGIC HISTORY:   Ovarian cancer, bilateral ([place])   03/16/2014 Imaging    Outside hospital CT scan of the abdomen and pelvis reviewed the pelvic mass measured 9.9 cm 20.1 cm 17.8 cm consistent with ovarian cancer. 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 Alycia Rossetti performed EXPLORATORY LAPAROTOMY, EXPLORATORY CELIOTOMY WITH OR WITHOUT BIOPSY(S) URETEROLYSIS, WITH OR WITHOUT REPOSITIONING OF URETER FOR RETROPERITONEAL FIBROSIS BILATERAL S&O W/OMENTECTOMY, TAH/RADICAL DISSECT FOR DEBULK; W/PELVIC LYMPHAD/LMTD PARA-AORTIC LYMPHADENECT         03/31/2014 Pathology Results    Diagnosis:  FSA, A:Ovary and fallopian tube, left, left salpingo-oophorectomy  Histologic type:Clear cell carcinoma (see comment)   Histologic grade:high grade/grade 3   Tumor site: (ovary/primary peritoneal)ovary   Tumor size: (greatest dimension)13 x 9.5 x 6.2 cm   Ovarian surface involvement:not identified   Status of capsule: (intact/ruptured)intact   Extent of involvement of other tissues/organs (select all that apply):  Right ovary  Left ovary  Lymphovascular space invasion:present   Regional lymph nodes (see other specimens): Total number involved:1 Total number examined:10 Size of largest metastasis:microscopic focus only, approximately 1 mm;  necrosis, fibrosis, and foreign body type giant cell reaction present within two additional lymph nodes, changes suggestive of treatment effect or regression   Additional pathologic  findings:none   AJCC Pathologic Stage: pT3a1(i) pN1 pMx FIGO (2014classification) Stage Grouping:IIIA1(i)  Note:This pathologic stage assessment is based on information available at the time of this report, and is subject to change pending clinical review and additional information.  B:Ovary and fallopian tube, right, right salpingo-oophorectomy  Histologic type:clear cell carcinoma (B8)  Histologic grade:high grade/grade 3   Tumor site: (ovary/primary peritoneal)ovary   Tumor size: (greatest dimension)11 x 9 x 4.5 cm   Ovarian surface involvement:not identified   Status of capsule: (intact/ruptured)ruptured   C:Uterus and cervix, hysterectomy  Cervix  - No tumor seen   Endometrium  - Atrophic endometrium   Myometrium  - Adenomyosis and subserosal endometriosis   D:Omentum, omentectomy - No tumor seen   E:Lymph nodes, right periaortic, regional dissection - Three lymph nodes, no tumor seen (0/3)   F:Lymph nodes, right pelvic, regional dissection - 1 out of 6 lymph nodes shows minute (approximately 2 mm) focus of metastatic adenocarcinoma (1/6) - Two additional lymph nodes show necrosis, fibrosis, and foreign body type giant cell reaction suggestive of treatment effect versus regression   G:Lymph node, left pelvic, regional dissection - One lymph node, no tumor seen (0/1)   COMMENT: Multiple immunostains are performed on block A12 to further evaluate the case for metastasis to the bilateral ovaries form a distant site and to evaluate for mucinous versus clear cell change. The tumor shows focal intracytoplasmic mucin on mucin stains, and immunostains shows the tumor to be cytokeratin 7 positive, cytokeratin 20 negative, CDX2 negative, PAX8 positive, and negative for chromogranin, synaptophysin, TTF1, p53, ER, and PR, and rare positive cells on CA125, with low p53 (approximately 5%). Napsin is  positive. RCC and CD10 are negative. PAS is positive and the positive staining does not digest with PASD.  These findings supporting a tumor of primary gynecologic origin. Overall, a clear cell carcinoma is favored. Seromucinous features cannot be excluded based on the presence of scattered, faint intracytoplasmic mucin noted on mucin stain and the morphology with focal signet ring cells. Although uncommon, this has been described, however, in clear cell carcinomas, therefore the above classification is favored       03/31/2014 Tumor Marker    Patient's tumor was tested for the following markers: CA125 Results of the tumor marker test revealed 41.3      05/11/2014 Tumor Marker    Patient's tumor was tested for the following markers: CA125 Results of the tumor marker test revealed 8      05/12/2014 - 09/01/2014 Chemotherapy    She received adjuvant chemotherapy with carbo/taxol for 6 cycles      05/12/2014 Genetic Testing    Genetics testing:OvaNext panel 05-12-14 identified a variant of uncertain significance, RAD51D, p.T103A      06/01/2014 Tumor Marker    Patient's tumor was tested for the following markers: CA125 Results of the tumor marker test revealed 10      06/27/2014 Tumor Marker    Patient's tumor was tested for the following markers: CA125 Results of the tumor marker test revealed 5      08/10/2014 Tumor Marker    .Patient's tumor was tested for the following markers: CA125 Results of the tumor marker test revealed 6      09/14/2014 Imaging    Segment of circumferential bowel wall narrowing at the junction of the sigmoid colon and rectum is concerning for a serosal metastasis with subsequent constriction of the bowel lumen. There is a moderate volume of stool throughout the colon suggesting some degree of early obstruction. There is stool in the rectum. 2. Status post hysterectomy and resection of the cystic ovarian masses. 3. Three nodules adherent to the transverse colon are  concerning for serosal metastasis. 4. Subcutaneous thickening at the umbilicus likely related to surgery.      09/14/2014 Tumor Marker    Patient's tumor was tested for the following markers: CA125 Results of the tumor marker test revealed 5      09/27/2014 PET scan    No abnormal hypermetabolism above normal colon at the rectosigmoid junction, at the site of suspected mass in narrowing on 09/14/2014. 2. Small soft tissue nodules along the distal transverse colon do not show abnormal hypermetabolism but may be too small for PET resolution.      10/05/2014 Procedure    She underwent sigmoid colon biopsy      10/05/2014 Pathology Results    Sigmoid colon biopsy revealed acute colitis, nonspecific      10/18/2014 Tumor Marker    Patient's tumor was tested for the following markers: CA125 Results of the tumor marker test revealed 5      01/08/2015 Tumor Marker    Patient's tumor was tested for the following markers: CA125 Results of the tumor marker test revealed 4      04/04/2015 Tumor Marker    Patient's tumor was tested for the following markers: CA125 Results of the tumor marker test revealed 6.9      07/12/2015 Tumor Marker    Patient's tumor was tested for the following markers: CA125 Results of the tumor marker test revealed 7.5      10/10/2015 Tumor Marker    Patient's tumor was tested for the following markers: CA125 Results of the tumor marker test revealed 11.4      11/05/2015  Tumor Marker    Patient's tumor was tested for the following markers: CA125 Results of the tumor marker test revealed 18.4      11/19/2015 Imaging    1. 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 2. New large subcapsular metastatic masses along LEFT and RIGHT hepatic lobes and positioned between the diaphragm and the RIGHT hepatic lobe. 3. Peritoneal metastasis with new nodules within the mesentery of the small bowel and colon. 4. Potential serosal  implant within the proximal sigmoid colon. No bowel obstruction 5. Small amount of ascites is new from prior. 6. Mild pelvic lymphadenopathy consistent with  metastatic disease      11/30/2015 Tumor Marker    Patient's tumor was tested for the following markers: CA125 Results of the tumor marker test revealed 32.4      12/04/2015 Procedure    Ultrasound and fluoroscopically guided right internal jugular single lumen power port catheter insertion. Tip in the SVC/RA junction. Catheter ready for use      12/06/2015 - 12/28/2015 Chemotherapy    She received 2 cycles of carbo/taxol, DC due to disease progression      12/21/2015 Tumor Marker    Patient's tumor was tested for the following markers: CA125 Results of the tumor marker test revealed 48.4      01/14/2016 Tumor Marker    Patient's tumor was tested for the following markers: CA125 Results of the tumor marker test revealed 74.6      01/16/2016 Imaging    Prominent interval progression of metastatic disease in the abdomen and pelvis. Large capsular lesions along the liver are associated with enlarging and new omental, mesenteric and peritoneal nodules and masses throughout the abdomen and pelvis. 2. Interval progression of intraperitoneal free fluid. 3. Interval development of new small left pleural effusion. 4. Lesion involving the sigmoid colon has progressed in the interval. No evidence for colonic obstruction at this time.      01/16/2016 Imaging    LV EF: 55% -   60%      01/25/2016 - 05/02/2016 Chemotherapy    She received Avastin with Doxil. Avastin was discontinued when she developed massive PE.  Doxil was stopped due to disease progression       02/16/2016 Imaging    Massive bilateral occlusive pulmonary emboli. CT evidence of right heart strain (RV/LV Ratio = 1.46) consistent with at least submassive (intermediate risk) PE. The presence of right heart strain has been associated with an increased risk of morbidity and  mortality. 2. Large layering RIGHT pleural effusion. Concern for malignant effusion. 3. New large mass in the gastrohepatic ligament (6 cm) concerning for progression of ovarian carcinoma.      02/17/2016 - 02/21/2016 Hospital Admission    She was admitted to the hospital and was found to have DVT and severe PE with heart strain. She was discharged home on Lovenox      02/18/2016 Imaging    Normal left ventricular size and systolic function. Severely dilated right ventricle with moderately decreased systolic function and severe pulmonary hypertension suspicious for pulmonary embolism.      02/18/2016 Imaging    Findings consistent with acute deep vein thrombosis involving the popliteal and distal femoral veins of the left lower extremity.       02/25/2016 Tumor Marker    Patient's tumor was tested for the following markers: CA125 Results of the tumor marker test revealed 89.5      03/25/2016 Imaging    No  acute findings within the abdomen or pelvis. 2. Large volume of peritoneal tumor is identified within the abdomen and pelvis. This is stable to slightly increased in size in the interval. 3. Decrease in volume of ascites 4. Left pleural effusion is slightly increased in volume from previous exam.      03/28/2016 Adverse Reaction    She received 2 units of blood transfusion      04/04/2016 Tumor Marker    Patient's tumor was tested for the following markers: CA125 Results of the tumor marker test revealed 71.2      05/02/2016 Tumor Marker    Patient's tumor was tested for the following markers: CA125 Results of the tumor marker test revealed 58.2      05/22/2016 Imaging    Left ventricle: The cavity size was normal. Systolic function was normal. Wall motion was normal; there were no regional wall motion abnormalities. Doppler parameters are consistent with abnormal left ventricular relaxation (grade 1 diastolic dysfunction).      05/28/2016 Imaging    CT: 1. Interval increased burden  of bulky peritoneal carcinomatosis as detailed. Small volume malignant ascites is increased. 2. Stable mild right pelvic adenopathy. 3. Moderate dependent left pleural effusion is increased. 4. Near complete resolution of previously visualized pulmonary emboli as detailed . 5. Stable moderate umbilical hernia containing small bowel loops, with no bowel complication.      05/28/2016 Tumor Marker    Patient's tumor was tested for the following markers: CA125 Results of the tumor marker test revealed 44.5      07/08/2016 -  Chemotherapy    She received Gemzar       INTERVAL HISTORY: Please see below for problem oriented charting. She returns with her husband prior to cycle 2 of treatment She brought with her information about NCI clinical trial NCI [id number] She is interested to pursue treatment if possible She complain of dizziness, daily nausea, without vomiting, and denies constipation Her appetite is poor and she have lost some weight The patient denies any recent signs or symptoms of bleeding such as spontaneous epistaxis, hematuria or hematochezia. She denies pain  REVIEW OF SYSTEMS:   Constitutional: Denies fevers, chills or abnormal weight loss Eyes: Denies blurriness of vision Ears, nose, mouth, throat, and face: Denies mucositis or sore throat Respiratory: Denies cough, dyspnea or wheezes Cardiovascular: Denies palpitation, chest discomfort or lower extremity swelling Skin: Denies abnormal skin rashes Lymphatics: Denies new lymphadenopathy or easy bruising Behavioral/Psych: Mood is stable, no new changes  All other systems were reviewed with the patient and are negative.  I have reviewed the past medical history, past surgical history, social history and family history with the patient and they are unchanged from previous note.  ALLERGIES:  is allergic to sulfa antibiotics.  MEDICATIONS:  Current Outpatient Prescriptions  Medication Sig Dispense Refill  . acetaminophen  (TYLENOL) 500 MG tablet Take 1,000 mg by mouth every 6 (six) hours as needed.    . chlorpheniramine (CHLOR-TRIMETON) 4 MG tablet Take 4 mg by mouth 2 (two) times daily as needed for allergies.    . fluticasone (FLONASE) 50 MCG/ACT nasal spray Place 1 spray into both nostrils daily as needed. (Patient taking differently: Place 1 spray into both nostrils daily. ) 16 g 0  . lidocaine-prilocaine (EMLA) cream Apply 1-2 hours prior to [facility] access as directed. 30 g 1  . omeprazole (PRILOSEC) 20 MG capsule Take 2 tablets x 30-60 min before first meal of the day    .  ondansetron (ZOFRAN) 8 MG tablet Take 1 tablet (8 mg total) by mouth every 8 (eight) hours as needed for nausea (Will not make drowsy). 30 tablet 2  . polyethylene glycol (MIRALAX / GLYCOLAX) packet Take 17 g by mouth daily as needed.     . rivaroxaban (XARELTO) 20 MG TABS tablet Take 1 tablet (20 mg total) by mouth daily with supper. 30 tablet 9  . famotidine (PEPCID) 20 MG tablet One at bedtime (Patient not taking: Reported on 07/15/2016) 30 tablet 2  . HYDROcodone-homatropine (HYCODAN) 5-1.5 MG/5ML syrup TAKE 5 MLS BY MOUTH EVERY SIX HOURS AS NEEDED FOR COUGH  0  . HYDROmorphone (DILAUDID) 2 MG tablet Take 1 tablet (2 mg total) by mouth every 4 (four) hours as needed for severe pain. (Patient not taking: Reported on 07/15/2016) 30 tablet 0  . LORazepam (ATIVAN) 0.5 MG tablet Take 1 tablet (0.5 mg total) by mouth every 6 (six) hours as needed for anxiety. (Patient not taking: Reported on 07/15/2016) 60 tablet 0  . mirtazapine (REMERON) 30 MG tablet Take 1 tablet (30 mg total) by mouth at bedtime. (Patient not taking: Reported on 07/15/2016) 30 tablet 1  . prochlorperazine (COMPAZINE) 10 MG tablet Take 1 tablet (10 mg total) by mouth every 6 (six) hours as needed. 30 tablet 3   No current facility-administered medications for this visit.    Facility-Administered Medications Ordered in Other Visits  Medication Dose Route Frequency Provider  Last Rate Last Dose  . ferumoxytol (FERAHEME) 510 mg in sodium chloride 0.9 % 100 mL IVPB  510 mg Intravenous Once Livesay, [name], MD        PHYSICAL EXAMINATION: ECOG PERFORMANCE STATUS: 2 - Symptomatic, <50% confined to bed  Vitals:   07/15/16 1008  BP: 117/75  Pulse: (!) 108  Resp: 18  Temp: 97.6 F (36.4 C)   Filed Weights   07/15/16 1008  Weight: 159 lb (72.1 kg)    GENERAL:alert, no distress and comfortable SKIN: skin color, texture, turgor are normal, no rashes or significant lesions EYES: normal, Conjunctiva are pale and non-injected, sclera clear OROPHARYNX:no exudate, no erythema and lips, buccal mucosa, and tongue normal  NECK: supple, thyroid normal size, non-tender, without nodularity LYMPH:  no palpable lymphadenopathy in the cervical, axillary or inguinal LUNGS: clear to auscultation and percussion with normal breathing effort HEART: She has tachycardia, no murmurs no lower extremity edema ABDOMEN:abdomen soft, non-tender and normal bowel sounds Musculoskeletal:no cyanosis of digits and no clubbing  NEURO: alert & oriented x 3 with fluent speech, no focal motor/sensory deficits  LABORATORY DATA:  I have reviewed the data as listed    Component Value Date/Time   NA 139 07/15/2016 0931   K 4.3 07/15/2016 0931   CL 109 02/20/2016 0531   CO2 25 07/15/2016 0931   GLUCOSE 111 07/15/2016 0931   BUN 20.1 07/15/2016 0931   CREATININE 1.3 (H) 07/15/2016 0931   CALCIUM 10.1 07/15/2016 0931   PROT 7.9 07/15/2016 0931   ALBUMIN 2.9 (L) 07/15/2016 0931   AST 14 07/15/2016 0931   ALT 9 07/15/2016 0931   ALKPHOS 84 07/15/2016 0931   BILITOT 0.48 07/15/2016 0931   GFRNONAA 39 (L) 02/20/2016 0531   GFRAA 45 (L) 02/20/2016 0531    No results found for: SPEP, UPEP  Lab Results  Component Value Date   WBC 4.9 07/15/2016   NEUTROABS 2.9 07/15/2016   HGB 7.6 (L) 07/15/2016   HCT 22.5 (L) 07/15/2016   MCV 87.0 07/15/2016  PLT 146 07/15/2016      Chemistry       Component Value Date/Time   NA 139 07/15/2016 0931   K 4.3 07/15/2016 0931   CL 109 02/20/2016 0531   CO2 25 07/15/2016 0931   BUN 20.1 07/15/2016 0931   CREATININE 1.3 (H) 07/15/2016 0931      Component Value Date/Time   CALCIUM 10.1 07/15/2016 0931   ALKPHOS 84 07/15/2016 0931   AST 14 07/15/2016 0931   ALT 9 07/15/2016 0931   BILITOT 0.48 07/15/2016 0931      ASSESSMENT & PLAN:  Ovarian cancer, bilateral ([place]) So far, she tolerated chemotherapy well except for nausea and severe anemia I would proceed with treatment without dose adjustment I will continue to provide blood transfusion support as needed I would look into the NCI clinical trial and give her feedback when I see her back next week  Nausea without vomiting She has very significant daily nausea especially in the morning She denies constipation She has been taking Zofran on a regular basis She complain of dizziness She denies headache or focal neurological deficits I am concerned about potential metastatic cancer to the brain I recommend MRI imaging to be done this was possible to for evaluation I recommend she take daily Zofran for now  Anemia due to antineoplastic chemotherapy We discussed some of the risks, benefits, and alternatives of blood transfusions. The patient is symptomatic from anemia and the hemoglobin level is critically low.  Some of the side-effects to be expected including risks of transfusion reactions, chills, infection, syndrome of volume overload and risk of hospitalization from various reasons and the patient is willing to proceed and went ahead to sign consent today. Plan to proceed with chemo and will continue transfusion support of 2 units of blood whenever her hemoglobin is less than 8  Malignant cachexia ([place]) The patient is started on mirtazapine.  Moderate protein-calorie malnutrition ([place]) We discussed the importance of regular intake as tolerated I recommend she takes regular  Zofran to reduce the risk of nausea as a cause of her poor appetite She is started on multiple different medications as appetite stimulant   Orders Placed This Encounter  Procedures  . MR BRAIN W WO CONTRAST    Standing Status:   Future    Standing Expiration Date:   08/19/2017    Order Specific Question:   Reason for exam:    Answer:   nausea, ovarian ca, exclude brain mets    Order Specific Question:   Preferred imaging location?    Answer:   [facility] (table limit-350 lbs)    Order Specific Question:   Does the patient have a pacemaker or implanted devices?    Answer:   No  . Comprehensive metabolic panel    Standing Status:   Standing    Number of Occurrences:   22    Standing Expiration Date:   07/16/2017  . CBC with Differential/Platelet    Standing Status:   Standing    Number of Occurrences:   22    Standing Expiration Date:   07/16/2017   All questions were answered. The patient knows to call the clinic with any problems, questions or concerns. No barriers to learning was detected. I spent 30 minutes counseling the patient face to face. The total time spent in the appointment was 40 minutes and more than 50% was on counseling and review of test results     Heath Lark, MD 07/16/2016 4:23  PM

## 2016-07-20 ENCOUNTER — Encounter (HOSPITAL_COMMUNITY): Payer: Self-pay | Admitting: *Deleted

## 2016-07-20 ENCOUNTER — Observation Stay (HOSPITAL_COMMUNITY)
Admission: EM | Admit: 2016-07-20 | Discharge: 2016-07-21 | Disposition: A | Discharge status: Home / Self Care | Payer: Managed Care, Other (non HMO) | Attending: Family Medicine | Admitting: Family Medicine

## 2016-07-20 DIAGNOSIS — T451X5A Adverse effect of antineoplastic and immunosuppressive drugs, initial encounter: Secondary | ICD-10-CM

## 2016-07-20 DIAGNOSIS — J45991 Cough variant asthma: Secondary | ICD-10-CM | POA: Diagnosis not present

## 2016-07-20 DIAGNOSIS — Z79899 Other long term (current) drug therapy: Secondary | ICD-10-CM | POA: Insufficient documentation

## 2016-07-20 DIAGNOSIS — N183 Chronic kidney disease, stage 3 unspecified: Secondary | ICD-10-CM | POA: Diagnosis present

## 2016-07-20 DIAGNOSIS — D6481 Anemia due to antineoplastic chemotherapy: Secondary | ICD-10-CM | POA: Diagnosis not present

## 2016-07-20 DIAGNOSIS — I129 Hypertensive chronic kidney disease with stage 1 through stage 4 chronic kidney disease, or unspecified chronic kidney disease: Secondary | ICD-10-CM | POA: Insufficient documentation

## 2016-07-20 DIAGNOSIS — K219 Gastro-esophageal reflux disease without esophagitis: Secondary | ICD-10-CM | POA: Diagnosis not present

## 2016-07-20 DIAGNOSIS — C569 Malignant neoplasm of unspecified ovary: Secondary | ICD-10-CM | POA: Insufficient documentation

## 2016-07-20 DIAGNOSIS — E440 Moderate protein-calorie malnutrition: Secondary | ICD-10-CM | POA: Diagnosis not present

## 2016-07-20 DIAGNOSIS — Z7901 Long term (current) use of anticoagulants: Secondary | ICD-10-CM | POA: Diagnosis not present

## 2016-07-20 DIAGNOSIS — R55 Syncope and collapse: Secondary | ICD-10-CM | POA: Insufficient documentation

## 2016-07-20 DIAGNOSIS — C562 Malignant neoplasm of left ovary: Secondary | ICD-10-CM

## 2016-07-20 DIAGNOSIS — C561 Malignant neoplasm of right ovary: Secondary | ICD-10-CM | POA: Diagnosis present

## 2016-07-20 DIAGNOSIS — D696 Thrombocytopenia, unspecified: Secondary | ICD-10-CM

## 2016-07-20 DIAGNOSIS — R64 Cachexia: Secondary | ICD-10-CM | POA: Diagnosis present

## 2016-07-20 DIAGNOSIS — C563 Malignant neoplasm of bilateral ovaries: Secondary | ICD-10-CM | POA: Diagnosis present

## 2016-07-20 DIAGNOSIS — D649 Anemia, unspecified: Secondary | ICD-10-CM

## 2016-07-20 DIAGNOSIS — I82409 Acute embolism and thrombosis of unspecified deep veins of unspecified lower extremity: Secondary | ICD-10-CM

## 2016-07-20 DIAGNOSIS — E861 Hypovolemia: Secondary | ICD-10-CM | POA: Diagnosis not present

## 2016-07-20 LAB — URINALYSIS, ROUTINE W REFLEX MICROSCOPIC
BILIRUBIN URINE: NEGATIVE
GLUCOSE, UA: NEGATIVE mg/dL
Hgb urine dipstick: NEGATIVE
KETONES UR: NEGATIVE mg/dL
Leukocytes, UA: NEGATIVE
Nitrite: NEGATIVE
PH: 5 (ref 5.0–8.0)
Protein, ur: NEGATIVE mg/dL
Specific Gravity, Urine: 1.012 (ref 1.005–1.030)

## 2016-07-20 LAB — CBC
HCT: 18.4 % — ABNORMAL LOW (ref 36.0–46.0)
HEMATOCRIT: 24.3 % — AB (ref 36.0–46.0)
Hemoglobin: 8 g/dL — ABNORMAL LOW (ref 12.0–15.0)
Hemoglobin: 6.1 g/dL — CL (ref 12.0–15.0)
MCH: 29.4 pg (ref 26.0–34.0)
MCH: 29.3 pg (ref 26.0–34.0)
MCHC: 32.9 g/dL (ref 30.0–36.0)
MCHC: 33.2 g/dL (ref 30.0–36.0)
MCV: 89.3 fL (ref 78.0–100.0)
MCV: 88.5 fL (ref 78.0–100.0)
PLATELETS: 57 10*3/uL — AB (ref 150–400)
PLATELETS: 44 10*3/uL — AB (ref 150–400)
RBC: 2.72 MIL/uL — ABNORMAL LOW (ref 3.87–5.11)
RBC: 2.08 MIL/uL — AB (ref 3.87–5.11)
RDW: 15 % (ref 11.5–15.5)
RDW: 14.9 % (ref 11.5–15.5)
WBC: 5.9 10*3/uL (ref 4.0–10.5)
WBC: 4.8 10*3/uL (ref 4.0–10.5)

## 2016-07-20 LAB — I-STAT CG4 LACTIC ACID, ED: Lactic Acid, Venous: 1.6 mmol/L (ref 0.5–1.9)

## 2016-07-20 LAB — ABO/RH: ABO/RH(D): B POS

## 2016-07-20 LAB — COMPREHENSIVE METABOLIC PANEL
ALBUMIN: 3.3 g/dL — AB (ref 3.5–5.0)
ALK PHOS: 78 U/L (ref 38–126)
ALT: 12 U/L — AB (ref 14–54)
AST: 23 U/L (ref 15–41)
Anion gap: 12 (ref 5–15)
BILIRUBIN TOTAL: 0.6 mg/dL (ref 0.3–1.2)
BUN: 19 mg/dL (ref 6–20)
CO2: 22 mmol/L (ref 22–32)
Calcium: 9.3 mg/dL (ref 8.9–10.3)
Chloride: 101 mmol/L (ref 101–111)
Creatinine, Ser: 1.35 mg/dL — ABNORMAL HIGH (ref 0.44–1.00)
GFR calc Af Amer: 50 mL/min — ABNORMAL LOW (ref >60)
GFR calc non Af Amer: 43 mL/min — ABNORMAL LOW (ref >60)
GLUCOSE: 145 mg/dL — AB (ref 65–99)
POTASSIUM: 4.3 mmol/L (ref 3.5–5.1)
Sodium: 135 mmol/L (ref 135–145)
TOTAL PROTEIN: 7.6 g/dL (ref 6.5–8.1)

## 2016-07-20 LAB — POC OCCULT BLOOD, ED: FECAL OCCULT BLD: NEGATIVE

## 2016-07-20 LAB — I-STAT TROPONIN, ED: Troponin i, poc: 0 ng/mL (ref 0.00–0.08)

## 2016-07-20 MED ORDER — SODIUM CHLORIDE 0.9% FLUSH: 3.0000 mL | Freq: Two times a day (BID) | INTRAVENOUS | Status: DC

## 2016-07-20 MED ORDER — SODIUM CHLORIDE 0.9% FLUSH
10.0000 mL | INTRAVENOUS | Status: DC | PRN
  Administered 2016-07-20: 20 mL
  Administered 2016-07-21: 10 mL
  Filled 2016-07-20 (×2): qty 40

## 2016-07-20 MED ORDER — RIVAROXABAN 20 MG PO TABS
20.0000 mg | ORAL_TABLET | Freq: Every day | ORAL | Status: DC
  Administered 2016-07-20: 20 mg via ORAL
  Filled 2016-07-20: qty 1

## 2016-07-20 MED ORDER — SODIUM CHLORIDE 0.9 % IV SOLN: 10.0000 mL/h | Freq: Once | INTRAVENOUS | Status: DC

## 2016-07-20 MED ORDER — PANTOPRAZOLE SODIUM 40 MG PO TBEC
40.0000 mg | DELAYED_RELEASE_TABLET | Freq: Every day | ORAL | Status: DC
  Administered 2016-07-20 – 2016-07-21 (×2): 40 mg via ORAL
  Filled 2016-07-20 (×2): qty 1

## 2016-07-20 NOTE — ED Notes (Signed)
Report attempted 

## 2016-07-20 NOTE — ED Notes (Signed)
CRITICAL VALUE ALERT  Critical Value: Hg 6.1

## 2016-07-20 NOTE — ED Notes (Signed)
Hospitalist at the bedside 

## 2016-07-20 NOTE — ED Provider Notes (Signed)
[facility] DEPT Provider Note   CSN: [id number] Arrival date & time: 07/20/16  1619     History   Chief Complaint Chief Complaint  Patient presents with  . Loss of Consciousness    HPI SHAHEEN STAR is a 56 y.o. female.  56 year old female with past medical history including ovarian cancer on Porter, Audrey Porter, Audrey D who presents with lightheadedness and syncope. Patient was feeling lightheaded today. She went to take a bath and while she was sitting in the bathroom, she passed out and fell to the floor. She states she did not strike her head but does have mild left shoulder pain from hitting her shoulder. The patient reports lightheadedness particularly with standing and walking around as well as shortness of breath with exertion. She has had these symptoms previously when anemic. Her last dose of Porter was 5 days ago and she had a blood transfusion on that same day for anemia. She began having diarrhea a few days ago. She has been drinking red Gatorade and eating watermelon, she noticed a few red specks in her stool today but is not sure whether it was related to her diet. She denies any fever, chest pain, abdominal pain, or vomiting. No urinary symptoms. She does have a chronic mild cough related to sinus drainage.   The history is provided by the patient.  Loss of Consciousness      Past Medical History:  Diagnosis Date  . Acid reflux   . Acute saddle pulmonary embolism with acute cor pulmonale (HCC)   . Anemia associated with Porter   . Hypertension   . Ovarian ca [facility])     Patient Active Problem List   Diagnosis Date Noted  . Nausea without vomiting 07/16/2016  . Moderate protein-calorie malnutrition ([place]) 07/16/2016  . Goals of care, counseling/discussion 06/30/2016  . Malignant cachexia ([place]) 06/30/2016  . Dysuria 05/30/2016  . Atypical chest pain 04/04/2016  . Cough variant asthma 03/26/2016  . Hypercalcemia 03/09/2016  . Acute deep vein  thrombosis (DVT) of left femoral vein ([place]) 02/26/2016  . Poor nutrition 02/26/2016  . Acute saddle pulmonary embolism with acute cor pulmonale ([place]) 02/17/2016  . Iron deficiency anemia due to chronic blood loss 12/22/2015  . Port catheter in place 12/13/2015  . Ovarian cancer ([place]) 12/02/2015  . CKD (chronic kidney disease) stage 3, GFR 30-59 ml/min 12/02/2015  . Monoallelic mutation of [id number] gene 10/18/2014  . PICC (peripherally inserted central catheter) flush 08/25/2014  . Fibrocystic breast determined by biopsy 08/05/2014  . Anemia due to antineoplastic Porter 08/03/2014  . Poor venous access 08/03/2014  . Drug-induced hypotension 05/27/2014  . Porter induced neutropenia ([place]) 05/27/2014  . 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 Family history of malignant neoplasm of breast 05/12/2014  . Family history of malignant neoplasm of gastrointestinal tract 05/12/2014  . Hypersensitivity reaction 05/12/2014  . Environmental allergies 05/07/2014  . Gastroesophageal reflux disease without esophagitis 05/07/2014  . HTN (hypertension), benign 05/07/2014  . 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 Status post cholecystectomy 05/07/2014  . Ovarian cancer, bilateral ([place]) 04/26/2014    Past Surgical History:  Procedure Laterality Date  . ABDOMINAL HYSTERECTOMY  03/31/14   at [facility]  . CESAREAN SECTION  1990  . CHOLECYSTECTOMY  2000  . IR GENERIC HISTORICAL  12/04/2015   IR FLUORO GUIDE PORT INSERTION RIGHT 12/04/2015 [facility]ggory Keen, MD [facility]  . IR GENERIC HISTORICAL  12/04/2015   IR US GUIDE VASC ACCESS RIGHT 12/04/2015 [facility]ggory Keen, MD [facility]  .  NASAL SINUS SURGERY      OB History    No data available       Home Medications    Prior to Admission medications   Medication Sig Start Date End Date Taking? Authorizing Provider  acetaminophen (TYLENOL) 500 MG tablet Take 1,000 mg by mouth every 6 (six) hours as needed.    [provider]  chlorpheniramine (CHLOR-TRIMETON) 4 MG tablet Take 4 mg by mouth 2 (two) times daily as needed for allergies.    [provider]  famotidine (PEPCID) 20 MG tablet One at bedtime Patient not taking: Reported on 07/15/2016 03/26/16   Tanda Rockers, MD  fluticasone [facility]) 50 MCG/ACT nasal spray Place 1 spray into both nostrils daily as needed. Patient taking differently: Place 1 spray into both nostrils daily.  12/21/15   Livesay, [name], MD  HYDROcodone-homatropine (HYCODAN) 5-1.5 MG/5ML syrup TAKE 5 MLS BY MOUTH EVERY SIX HOURS AS NEEDED FOR COUGH 02/25/16   [provider]  HYDROmorphone (DILAUDID) 2 MG tablet Take 1 tablet (2 mg total) by mouth every 4 (four) hours as needed for severe pain. Patient not taking: Reported on 07/15/2016 04/04/16   Heath Lark, MD  lidocaine-prilocaine (EMLA) cream Apply 1-2 hours prior to [facility] access as directed. 12/03/15   Livesay, Tamala Julian, MD  LORazepam (ATIVAN) 0.5 MG tablet Take 1 tablet (0.5 mg total) by mouth every 6 (six) hours as needed for anxiety. Patient not taking: Reported on 07/15/2016 04/08/16   Heath Lark, MD  mirtazapine (REMERON) 30 MG tablet Take 1 tablet (30 mg total) by mouth at bedtime. Patient not taking: Reported on 07/15/2016 06/30/16   Heath Lark, MD  omeprazole (PRILOSEC) 20 MG capsule Take 2 tablets x 30-60 min before first meal of the day 04/24/16   Tanda Rockers, MD  ondansetron (ZOFRAN) 8 MG tablet Take 1 tablet (8 mg total) by mouth every 8 (eight) hours as needed for nausea (Will not make drowsy). 12/03/15   Livesay, Tamala Julian, MD  polyethylene glycol (MIRALAX / GLYCOLAX) packet Take 17 g by mouth daily as needed.     [provider]  prochlorperazine (COMPAZINE) 10 MG tablet Take 1 tablet (10 mg total) by mouth every 6 (six) hours as needed. 07/15/16   Heath Lark, MD  rivaroxaban (XARELTO) 20 MG TABS tablet Take 1 tablet (20 mg total) by mouth daily with supper. 05/30/16   Heath Lark, MD      Family History Family History  Problem Relation Age of Onset  . Cancer Brother 67       lung smoker  . Cancer Paternal Aunt 12       breast  . Cancer Maternal Grandmother 25       leukemia  . Cancer Paternal Grandmother        colorectal at unknown age    Social History Social History  Substance Use Topics  . Smoking status: Never Smoker  . Smokeless tobacco: Never Used  . Alcohol use No     Allergies   Sulfa antibiotics   Review of Systems Review of Systems  Cardiovascular: Positive for syncope.   All other systems reviewed and are negative except that which was mentioned in HPI   Physical Exam Updated Vital Signs BP 121/76   Pulse 98   Temp 97.9 F (36.6 C) (Oral)   Resp 20   SpO2 100%   Physical Exam  Constitutional: She is oriented to person, place, and time. She appears  well-developed and well-nourished. No distress.  HENT:  Head: Normocephalic and atraumatic.  Moist mucous membranes Pale tongue and lips  Eyes: Conjunctivae are normal. Pupils are equal, round, and reactive to Porter.  Neck: Neck supple.  Cardiovascular: Normal rate, regular rhythm and normal heart sounds.   No murmur heard. Pulmonary/Chest: Effort normal and breath sounds normal.  Abdominal: Soft. Bowel sounds are normal. She exhibits no distension. There is no tenderness.  Genitourinary:  Genitourinary Comments: Porter brown stool, normal rectal tone Chaperone was present during exam.   Musculoskeletal: She exhibits no edema.  Mild tenderness of superior L scapula, normal ROM left shoulder  Neurological: She is alert and oriented to person, place, and time.  Fluent speech  Skin: Skin is warm and dry. There is pallor.  Psychiatric: She has a normal mood and affect. Judgment normal.  Nursing note and vitals reviewed.    ED Treatments / Results  Labs (all labs ordered are listed, but only abnormal results are displayed) Labs Reviewed  CBC - Abnormal; Notable for the  following:       Result Value   RBC 2.72 (*)    Hemoglobin 8.0 (*)    HCT 24.3 (*)    Platelets 57 (*)    All other components within normal limits  COMPREHENSIVE METABOLIC PANEL - Abnormal; Notable for the following:    Glucose, Bld 145 (*)    Creatinine, Ser 1.35 (*)    Albumin 3.3 (*)    ALT 12 (*)    GFR calc non Af Amer 43 (*)    GFR calc Af Amer 50 (*)    All other components within normal limits  URINALYSIS, ROUTINE W REFLEX MICROSCOPIC  I-STAT TROPOININ, ED  I-STAT CG4 LACTIC ACID, ED  POC OCCULT BLOOD, ED  TYPE AND SCREEN  ABO/RH  PREPARE RBC (CROSSMATCH)    EKG  EKG Interpretation  Date/Time:  Sunday July 20 2016 16:33:50 EDT Ventricular Rate:  121 PR Interval:  126 QRS Duration: 66 QT Interval:  338 QTC Calculation: 479 R Axis:   18 Text Interpretation:  Sinus tachycardia Nonspecific T wave abnormality Abnormal ECG T wave inversions in III compared to previous Confirmed by Frederick Peers [phone]) on 07/20/2016 5:08:05 PM       Radiology No results found.  Procedures .Critical Care Performed by: Laurence Spates Authorized by: Laurence Spates   Critical care provider statement:    Critical care time (minutes):  30   Critical care time was exclusive of:  Separately billable procedures and treating other patients   Critical care was necessary to treat or prevent imminent or life-threatening deterioration of the following conditions:  Circulatory failure   Critical care was time spent personally by me on the following activities:  Development of treatment plan with patient or surrogate, examination of patient, obtaining history from patient or surrogate, ordering and performing treatments and interventions, ordering and review of laboratory studies, re-evaluation of patient's condition and review of old charts   (including critical care time)  Medications Ordered in ED Medications  0.9 %  sodium chloride infusion (not administered)      Initial Impression / Assessment and Plan / ED Course  I have reviewed the triage vital signs and the nursing notes.  Pertinent labs & imaging results that were available during my care of the patient were reviewed by me and considered in my medical decision making (see chart for details).     Pt w/ ovarian CA on chemo, recent  blood Transfusion for symptomatic anemia presents with lightheadedness and episode of syncope that occurred at home today. She was pale on exam but awake and alert, mentating appropriately, breathing comfortably on room air. Vital signs stable, patient afebrile. She stated she had seen some red specks in her stool, rectal exam showed no blood and Hemoccult negative. No complaints of abdominal pain. Obtained above lab work which showed an initial hemoglobin of 8 which is not drastically different from her baseline but she does state that she typically receives transfusions at threshold of below 8 and has been prone to severe symptoms due to anemia in the past. She has been mildly tachycardic here and because of syncopal episode, I feel that she would benefit from a blood transfusion due to ongoing symptomatic anemia. I did order a repeat CBC at 3 hours to evaluate for drop in hemoglobin given her report of possible blood in stool. This study is pending. Ordered 1 unit of RBCs. Recommended admission for transfusion and monitoring given syncope. Discussed admission with hospitalist, Dr. Loleta Books, and pt admitted for further care.  Final Clinical Impressions(s) / ED Diagnoses   Final diagnoses:  None    New Prescriptions New Prescriptions   No medications on file     Adeena Bernabe, Wenda Overland, MD 07/21/16 0151

## 2016-07-20 NOTE — ED Notes (Signed)
IV team at bedside to access port  

## 2016-07-20 NOTE — ED Triage Notes (Signed)
Pt is chemo pt, last treatment was Tuesday. Reports also getting blood transfusion that day due to anemia. States she had syncopal episode today. Also having recent diarrhea and possible blood in stools. Appears weak and pale at triage. Mask on pt.

## 2016-07-20 NOTE — H&P (Addendum)
History and Physical  Patient Name: Audrey Porter     RDE:[id number]    DOB: 10-Nov-1960    DOA: 07/20/2016 PCP: Caryl Bis, MD  Patient coming from: Home  Chief Complaint: Syncope      HPI: Audrey Porter is a 56 y.o. female with a past medical history significant for ovarian cancer on gemcitabine, chemo-induced anemia, CKD baseline Cr 1.2 who presents with syncope  The patient was in her usual state of health (currently on therapy with gemcitabine, last transfusion with her gemcitabine transfusion last Tuesday, chronically dizzy and weak, poor appetite and poor oral intake), until today, she was getting ready to take a shower (she always sits before she takes a shower because hot water makes her very dizzy and weak), when she suddenly felt a prodrome of lightheadedness, warmth, nausea, and like she had to sweat, and then passed out on the floor. She woke up shortly after and her husband helped her sit up and then brought her to the emergency room.  ED course: -Afebrile, heart rate 121, respirations pulse ox normal, blood pressure 100/82 -Na 135, K 4.3, Cr 1.35 (baseline 1.0-1.5), WBC 5.9K, Hgb 8 -Troponin negative -Lactic acid 1.6 -FOBT negative and no melena on exam (patient thought her BM had "flecks" of red in it, possibly from watermelon she ate recently), she takes Xarelto for recurrent PE, no hx of GIB -TRH were asked to admit for transfusion -Repeat Hgb before transfusion showed Hgb 6.1, no reported bleeding, no HR improving by itself in ER     ROS: Review of Systems  Constitutional: Positive for malaise/fatigue.  Respiratory: Negative for shortness of breath.   Cardiovascular: Negative for chest pain.  Gastrointestinal: Positive for nausea.  Genitourinary: Negative for dysuria, frequency and urgency.  Musculoskeletal: Positive for joint pain (shoulder, after fall).  Neurological: Positive for dizziness, loss of consciousness and weakness. Negative for speech  change, focal weakness, seizures and headaches.  All other systems reviewed and are negative.         Past Medical History:  Diagnosis Date  . Acid reflux   . Acute saddle pulmonary embolism with acute cor pulmonale (HCC)   . Anemia associated with chemotherapy   . Hypertension   . Ovarian ca [facility])     Past Surgical History:  Procedure Laterality Date  . ABDOMINAL HYSTERECTOMY  03/31/14   at [facility]  . CESAREAN SECTION  1990  . CHOLECYSTECTOMY  2000  . IR GENERIC HISTORICAL  12/04/2015   IR FLUORO GUIDE PORT INSERTION RIGHT 12/04/2015 [facility] Keen, MD [facility]  . IR GENERIC HISTORICAL  12/04/2015   IR US GUIDE VASC ACCESS RIGHT 12/04/2015 [facility] Keen, MD [facility]  . NASAL SINUS SURGERY      Social History: Patient lives with her husband.  The patient walks unassisted.  Nonsmoker.  Lives in [place], where she is from.  Daughter is about to start residency in Psychiatry in [place].  Patient was a Product/process development scientist for [employer].  Allergies  Allergen Reactions  . Sulfa Antibiotics Hives    Family history: family history includes Cancer in her paternal grandmother; Cancer (age of onset: 65) in her maternal grandmother; Cancer (age of onset: 37) in her brother; Cancer (age of onset: 42) in her paternal aunt.  Prior to Admission medications   Medication Sig Start Date End Date Taking? Authorizing Provider  acetaminophen (TYLENOL) 500 MG tablet Take 1,000 mg by mouth every 6 (six) hours as needed.    [provider]  chlorpheniramine (CHLOR-TRIMETON) 4 MG tablet Take 4 mg by mouth 2 (two) times daily as needed for allergies.    [provider]  famotidine (PEPCID) 20 MG tablet One at bedtime Patient not taking: Reported on 07/15/2016 03/26/16   Tanda Rockers, MD  fluticasone [facility]) 50 MCG/ACT nasal spray Place 1 spray into both nostrils daily as needed. Patient taking differently: Place 1 spray into both nostrils daily.  12/21/15   Livesay, [name], MD    HYDROcodone-homatropine (HYCODAN) 5-1.5 MG/5ML syrup TAKE 5 MLS BY MOUTH EVERY SIX HOURS AS NEEDED FOR COUGH 02/25/16   [provider]  HYDROmorphone (DILAUDID) 2 MG tablet Take 1 tablet (2 mg total) by mouth every 4 (four) hours as needed for severe pain. Patient not taking: Reported on 07/15/2016 04/04/16   Heath Lark, MD  lidocaine-prilocaine (EMLA) cream Apply 1-2 hours prior to [facility] access as directed. 12/03/15   Livesay, Tamala Julian, MD  LORazepam (ATIVAN) 0.5 MG tablet Take 1 tablet (0.5 mg total) by mouth every 6 (six) hours as needed for anxiety. Patient not taking: Reported on 07/15/2016 04/08/16   Heath Lark, MD  mirtazapine (REMERON) 30 MG tablet Take 1 tablet (30 mg total) by mouth at bedtime. Patient not taking: Reported on 07/15/2016 06/30/16   Heath Lark, MD  omeprazole (PRILOSEC) 20 MG capsule Take 2 tablets x 30-60 min before first meal of the day 04/24/16   Tanda Rockers, MD  ondansetron (ZOFRAN) 8 MG tablet Take 1 tablet (8 mg total) by mouth every 8 (eight) hours as needed for nausea (Will not make drowsy). 12/03/15   Livesay, Tamala Julian, MD  polyethylene glycol (MIRALAX / GLYCOLAX) packet Take 17 g by mouth daily as needed.     [provider]  prochlorperazine (COMPAZINE) 10 MG tablet Take 1 tablet (10 mg total) by mouth every 6 (six) hours as needed. 07/15/16   Heath Lark, MD  rivaroxaban (XARELTO) 20 MG TABS tablet Take 1 tablet (20 mg total) by mouth daily with supper. 05/30/16   Heath Lark, MD       Physical Exam: BP 117/80   Pulse (!) 114   Temp 98.6 F (37 C) (Oral)   Resp (!) 22   Ht 5\' 5"  (1.651 m)   Wt 71.7 kg (158 lb)   SpO2 91%   BMI 26.29 kg/m  General appearance: Well-developed, adult female, alert and in no acute distress.   Eyes: Anicteric, conjunctiva pink, lids and lashes normal. PERRL.    ENT: No nasal deformity, discharge, epistaxis.  Hearing normal. OP moist without lesions.   Neck: No neck masses.  Trachea midline.  No  thyromegaly/tenderness. Lymph: No cervical or supraclavicular lymphadenopathy. Skin: Warm and dry.  Very pale.  No suspicious rashes or lesions. Cardiac: Tachycardic, regular, nl S1-S2, no murmurs appreciated.  Capillary refill is brisk.  JVP not visible.  No LE edema.  Radial pulses 1+ and symmetric. Respiratory: Normal respiratory rate and rhythm.  CTAB without rales or wheezes. Abdomen: Abdomen soft.  No TTP. No ascites, distension, hepatosplenomegaly.   MSK: No deformities or effusions.  No cyanosis or clubbing. Neuro: Pupils are 3 mm and reactive to 2 mm.  Extraocular movements are intact, without nystagmus.  Cranial nerve 5 is within normal limits.  Cranial nerve 7 is symmetrical.  Cranial nerve 8 is within normal limits.  Cranial nerves 9 and 10 reveal equal palate elevation.  Cranial nerve 11 reveals sternocleidomastoid strong.  Cranial nerve 12 is  midline.  Motor strength testing is 5/5 in the upper and lower extremities bilaterally with normal motor, tone and bulk. Sensory examination is intact to light touch except bilateral feet.  No pronator drift.  Finger-to-nose testing is within normal limits. The patient is oriented to time, place and person.  Speech is fluent.  Naming is grossly intact.  Recall, recent and remote, as well as general fund of knowledge seem within normal limits.  Attention span and concentration are within normal limits.  Psych: Sensorium intact and responding to questions, attention normal.  Behavior appropriate.  Affect normal.  Judgment and insight appear normal.     Labs on Admission:  I have personally reviewed following labs and imaging studies: CBC:  Recent Labs Lab 07/15/16 0931 07/20/16 1635 07/20/16 1938  WBC 4.9 5.9 4.8  NEUTROABS 2.9  --   --   HGB 7.6* 8.0* 6.1*  HCT 22.5* 24.3* 18.4*  MCV 87.0 89.3 88.5  PLT 146 57* 44*   Basic Metabolic Panel:  Recent Labs Lab 07/15/16 0931 07/20/16 1635  NA 139 135  K 4.3 4.3  CL  --  101  CO2 25  22  GLUCOSE 111 145*  BUN 20.1 19  CREATININE 1.3* 1.35*  CALCIUM 10.1 9.3   GFR: Estimated Creatinine Clearance: 46.2 mL/min (A) (by C-G formula based on SCr of 1.35 mg/dL (H)).  Liver Function Tests:  Recent Labs Lab 07/15/16 0931 07/20/16 1635  AST 14 23  ALT 9 12*  ALKPHOS 84 78  BILITOT 0.48 0.6  PROT 7.9 7.6  ALBUMIN 2.9* 3.3*   Sepsis Labs: Lactate 1.6        EKG: Independently reviewed. Rate 121, QTc 479, old TWI in lead III, no acute changes, no ST changes.  Echocardiogram 2018: Report reviewed EF normal Grade I DD          Assessment/Plan  1. Chemotherapy induced anemia:  No evidence of bleeding nor history that suggests it.  FOBT negative in ER.  Hgb wildly variant in ER.  Last iron infusion she thinks was "a long time ago".  Disucssed with Onc, rec'd check iron studies and consider Feraheme before discharge if low. -Transfuse 1 unit now -Post-transfusion H/H and transfuse to target >8 g/dL -Check iron studies, ferritin, reticulocytes   2. Vasovagal syncope:  The patient describes a classic vagal prodrome (nausea, warmth, dizziness) then syncope.  Given chemo incr risk of CV disease, will: -Monitor on telemetry -Repeat troponin in AM  3. Thrombocytopenia:  New  4. Ovarian Cancer:  Has MRI brain scheduled tomorrow at 3pm.  No new focal signs on exam.  5. Recurrent VTE:  Very low concern for GIB based on history/exam -Continue Xarelto for now -If any sign of bleeding overnight, will immediately stop Xarelto  6. Other medicaitons:  -Continue PPI  7. Moderate protein cal malnutrition:  Patient has not yet started mirtazapine  8. CKD III: Baseline Cr 1.0-1.5, stable.            DVT prophylaxis: N/A  Code Status: FULL  Family Communication: Husband at bedside, daughter by phone  Disposition Plan: Anticipate transfusion, observe overnight, check iron studies, monitor for signs of bleeding. Consults called: D/w Onc by phone  only Admission status: OBS At the point of initial evaluation, it is my clinical opinion that admission for OBSERVATION is reasonable and necessary because the patient's presenting complaints in the context of their chronic conditions represent sufficient risk of deterioration or significant morbidity to constitute reasonable grounds  for close observation in the hospital setting, but that the patient may be medically stable for discharge from the hospital within 24 to 48 hours.    Medical decision making: Patient seen at 8:00 PM on 07/20/2016.  The patient was discussed with Dr. Rex Kras and Dr. Marin Olp.  What exists of the patient's chart was reviewed in depth and summarized above.  Clinical condition: stable.        Edwin Dada [facility] Pager [phone]

## 2016-07-21 ENCOUNTER — Observation Stay (HOSPITAL_COMMUNITY): Payer: Managed Care, Other (non HMO)

## 2016-07-21 ENCOUNTER — Ambulatory Visit (HOSPITAL_COMMUNITY)
Admission: RE | Admit: 2016-07-21 | Discharge: 2016-07-21 | Disposition: A | Discharge status: Home / Self Care | Payer: Managed Care, Other (non HMO) | Source: Ambulatory Visit | Attending: Hematology and Oncology | Admitting: Hematology and Oncology

## 2016-07-21 DIAGNOSIS — C561 Malignant neoplasm of right ovary: Secondary | ICD-10-CM

## 2016-07-21 DIAGNOSIS — R55 Syncope and collapse: Secondary | ICD-10-CM | POA: Diagnosis not present

## 2016-07-21 DIAGNOSIS — N183 Chronic kidney disease, stage 3 (moderate): Secondary | ICD-10-CM

## 2016-07-21 DIAGNOSIS — T451X5A Adverse effect of antineoplastic and immunosuppressive drugs, initial encounter: Secondary | ICD-10-CM

## 2016-07-21 DIAGNOSIS — D6481 Anemia due to antineoplastic chemotherapy: Principal | ICD-10-CM

## 2016-07-21 DIAGNOSIS — C562 Malignant neoplasm of left ovary: Secondary | ICD-10-CM | POA: Diagnosis not present

## 2016-07-21 DIAGNOSIS — C569 Malignant neoplasm of unspecified ovary: Secondary | ICD-10-CM | POA: Diagnosis present

## 2016-07-21 LAB — FERRITIN: FERRITIN: 1980 ng/mL — AB (ref 11–307)

## 2016-07-21 LAB — CBC
HCT: 25.8 % — ABNORMAL LOW (ref 36.0–46.0)
HEMATOCRIT: 21 % — AB (ref 36.0–46.0)
Hemoglobin: 7 g/dL — ABNORMAL LOW (ref 12.0–15.0)
Hemoglobin: 8.6 g/dL — ABNORMAL LOW (ref 12.0–15.0)
MCH: 28.8 pg (ref 26.0–34.0)
MCH: 29 pg (ref 26.0–34.0)
MCHC: 33.3 g/dL (ref 30.0–36.0)
MCHC: 33.3 g/dL (ref 30.0–36.0)
MCV: 86.4 fL (ref 78.0–100.0)
MCV: 86.9 fL (ref 78.0–100.0)
PLATELETS: 32 10*3/uL — AB (ref 150–400)
Platelets: 34 10*3/uL — ABNORMAL LOW (ref 150–400)
RBC: 2.43 MIL/uL — ABNORMAL LOW (ref 3.87–5.11)
RBC: 2.97 MIL/uL — ABNORMAL LOW (ref 3.87–5.11)
RDW: 14.8 % (ref 11.5–15.5)
RDW: 15.2 % (ref 11.5–15.5)
WBC: 4.5 10*3/uL (ref 4.0–10.5)
WBC: 3.7 10*3/uL — AB (ref 4.0–10.5)

## 2016-07-21 LAB — RETICULOCYTES
RBC.: 2.43 MIL/uL — AB (ref 3.87–5.11)
RETIC COUNT ABSOLUTE: 9.7 10*3/uL — AB (ref 19.0–186.0)
Retic Ct Pct: 0.4 % (ref 0.4–3.1)

## 2016-07-21 LAB — TROPONIN I: Troponin I: <0.03 ng/mL (ref <0.03)

## 2016-07-21 LAB — PREPARE RBC (CROSSMATCH)

## 2016-07-21 LAB — IRON AND TIBC
Iron: 59 ug/dL (ref 28–170)
SATURATION RATIOS: 36 % — AB (ref 10.4–31.8)
TIBC: 164 ug/dL — ABNORMAL LOW (ref 250–450)
UIBC: 105 ug/dL

## 2016-07-21 LAB — HIV ANTIBODY (ROUTINE TESTING W REFLEX): HIV SCREEN 4TH GENERATION: NONREACTIVE

## 2016-07-21 MED ORDER — GADOBENATE DIMEGLUMINE 529 MG/ML IV SOLN
15.0000 mL | Freq: Once | INTRAVENOUS | Status: AC | PRN
  Administered 2016-07-21: 14 mL via INTRAVENOUS

## 2016-07-21 NOTE — Discharge Instructions (Signed)
Information on my medicine - XARELTO (rivaroxaban)  This medication education was reviewed with me or my healthcare representative as part of my discharge preparation.  The pharmacist that spoke with me during my hospital stay was:  Arty Baumgartner, [place]? Xarelto was prescribed for prior blood clots in your lungs (pulmonary embolism) and to reduce the risk of them occurring again.  What do you need to know about Xarelto? The dose is one 20 mg tablet taken ONCE A DAY with your evening meal.  DO NOT stop taking Xarelto without talking to the health care provider who prescribed the medication.  Refill your prescription for 20 mg tablets before you run out.  After discharge, you should have regular check-up appointments with your healthcare provider that is prescribing your Xarelto.  In the future your dose may need to be changed if your kidney function changes by a significant amount.  What do you do if you miss a dose? If you are taking Xarelto TWICE DAILY and you miss a dose, take it as soon as you remember. You may take two 15 mg tablets (total 30 mg) at the same time then resume your regularly scheduled 15 mg twice daily the next day.  If you are taking Xarelto ONCE DAILY and you miss a dose, take it as soon as you remember on the same day then continue your regularly scheduled once daily regimen the next day. Do not take two doses of Xarelto at the same time.   Important Safety Information Xarelto is a blood thinner medicine that can cause bleeding. You should call your healthcare provider right away if you experience any of the following: ? Bleeding from an injury or your nose that does not stop. ? Unusual colored urine (red or dark brown) or unusual colored stools (red or black). ? Unusual bruising for unknown reasons. ? A serious fall or if you hit your head (even if there is no bleeding).  Some medicines may interact with Xarelto and  might increase your risk of bleeding while on Xarelto. To help avoid this, consult your healthcare provider or pharmacist prior to using any new prescription or non-prescription medications, including herbals, vitamins, non-steroidal anti-inflammatory drugs (NSAIDs) and supplements.  This website has more information on Xarelto: [email or web address]

## 2016-07-21 NOTE — Progress Notes (Signed)
Patient lab results back.  MD Danford notified and verbalized that another unit of PRBC and post H&H will be ordered.  Will continue to monitor and follow MD orders.

## 2016-07-21 NOTE — Discharge Summary (Signed)
Physician Discharge Summary  Audrey Porter  [id number]  DOB: 11-23-1960  DOA: 07/20/2016 PCP: Caryl Bis, MD  Admit date: 07/20/2016 Discharge date: 07/21/2016  Admitted From: Home  Disposition:  Home   Recommendations for Outpatient Follow-up:  1. Follow up with PCP in 1 week 2. Please obtain BMP/CBC in 3-5 days to monitor platelets, Hgb and kidney function  3. Follow up with oncologist in 1-2 weeks  Home Health: None  Equipment/Devices: None    Discharge Condition: Stable  CODE STATUS: FULL   Diet recommendation: Heart Healthy   Brief/Interim Summary: HPI per Dr Loleta Books: Audrey Porter is a 56 y.o. female with a past medical history significant for ovarian cancer on gemcitabine, chemo-induced anemia, CKD baseline Cr 1.2 who presents with syncope  The patient was in her usual state of health (currently on therapy with gemcitabine, last transfusion with her gemcitabine transfusion last Tuesday, chronically dizzy and weak, poor appetite and poor oral intake), she was getting ready to take a shower (she always sits before she takes a shower because hot water makes her very dizzy and weak), when she suddenly felt a prodrome of lightheadedness, warmth, nausea, and like she had to sweat, and then passed out on the floor. She woke up shortly after and her husband helped her sit up and then brought her to the emergency room.  In the ED was found to have Hgb of 8 which repeat results showed 6.1. Elevated HR and FOBT negative. Patient was given 2 units of PRBC's with Hgb coming up to 8.2. Patient now ambulating with no dizziness, still slight orthostatic but much better. No signs of overt bleeding. Patient will be discharge home to follow up with PCP   Subjective: Patient seen and examined, she report doing much better, no complaints at this time. Patient denies dizziness, chest pain, palpitations or SOB. No acute events on telemetry monitor. Patient afebrile.   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  On the day of the discharge the patient's vitals were stable, and no other acute medical condition were reported by patient. Patient was felt safe to be discharge to home   Discharge Instructions  You were cared for by a hospitalist during your hospital stay. If you have any questions about your discharge medications or the care you received while you were in the hospital after you are discharged, you can call the unit and asked to speak with the hospitalist on call if the hospitalist that took care of you is not available. Once you are discharged, your primary care physician will handle any further medical issues. Please note that NO REFILLS for any discharge medications will be authorized once you are discharged, as it is imperative that you return to your primary care physician (or establish a relationship with a primary care physician if  you do not have one) for your aftercare needs so that they can reassess your need for medications and monitor your lab values.   Allergies as of 07/21/2016      Reactions   Sulfa Antibiotics Hives      Medication List    STOP taking these medications   omeprazole 20 MG capsule Commonly known as:  PRILOSEC     TAKE these medications   acetaminophen 500 MG tablet Commonly known as:  TYLENOL Take 1,000 mg by mouth every 6  (six) hours as needed.   benzonatate 100 MG capsule Commonly known as:  TESSALON Take 100 mg by mouth 3 (three) times daily as needed for cough.   fluticasone 50 MCG/ACT nasal spray Commonly known as:  FLONASE Place 1 spray into both nostrils daily as needed. What changed:  reasons to take this   HYDROmorphone 2 MG tablet Commonly known as:  DILAUDID Take 1 tablet (2 mg total) by mouth every 4 (four) hours as needed for severe pain.   lidocaine-prilocaine cream Commonly known as:  EMLA Apply 1-2 hours prior to [facility] access as directed.   LORazepam 0.5 MG tablet Commonly known as:  ATIVAN Take 1 tablet (0.5 mg total) by mouth every 6 (six) hours as needed for anxiety.   magnesium hydroxide 400 MG/5ML suspension Commonly known as:  MILK OF MAGNESIA Take 15 mLs by mouth daily as needed for mild constipation.   mirtazapine 30 MG tablet Commonly known as:  REMERON Take 1 tablet (30 mg total) by mouth at bedtime.   ondansetron 8 MG tablet Commonly known as:  ZOFRAN Take 1 tablet (8 mg total) by mouth every 8 (eight) hours as needed for nausea (Will not make drowsy).   polyethylene glycol packet Commonly known as:  MIRALAX / GLYCOLAX Take 17 g by mouth daily as needed for mild constipation.   prochlorperazine 10 MG tablet Commonly known as:  COMPAZINE Take 1 tablet (10 mg total) by mouth every 6 (six) hours as needed.   rivaroxaban 20 MG Tabs tablet Commonly known as:  XARELTO Take 1 tablet (20 mg total) by mouth daily with supper.      Follow-up Information    Caryl Bis, MD. Schedule an appointment as soon as possible for a visit in 1 week(s).   Specialty:  Family Medicine Why:  Hospital follow up Contact information: [place] [address] [phone]          Allergies  Allergen Reactions  . Sulfa Antibiotics Hives    Consultations:  None    Procedures/Studies: Dg Scapula Left  Result Date: 07/21/2016 CLINICAL DATA:  Pain  after falling onto shoulder at noon yesterday. EXAM: LEFT SCAPULA - 2+ VIEWS COMPARISON:  None. FINDINGS: There is no evidence of fracture or other focal bone lesions. Soft tissues are unremarkable. IMPRESSION: Negative. Electronically Signed   By: Andreas Newport M.D.   On: 07/21/2016 00:26      Discharge Exam: Vitals:   07/21/16 0435 07/21/16 0643  BP: 104/66 115/73  Pulse: 100 96  Resp: 16 16  Temp: 98.6 F (37 C) 98.6 F (37 C)   Vitals:   07/21/16 0102 07/21/16 0407 07/21/16 0435 07/21/16 0643  BP:  (!) 105/54 104/66 115/73  Pulse:  (!) 105 100 96  Resp:  16 16 16   Temp:  98.1 F (36.7 C) 98.6 F (37 C) 98.6 F (37 C)  TempSrc:  Oral Oral Oral  SpO2:  98% 99%  100%  Weight: 70.1 kg (154 lb 9.6 oz)     Height:        General: Pt is alert, awake, not in acute distress Cardiovascular: RRR, S1/S2 +, no rubs, no gallops Respiratory: CTA bilaterally, no wheezing, no rhonchi Abdominal: Soft, NT, ND, bowel sounds + Extremities: no edema, no cyanosis   The results of significant diagnostics from this hospitalization (including imaging, microbiology, ancillary and laboratory) are listed below for reference.     Microbiology: No results found for this or any previous visit (from the past 240 hour(s)).   Labs: BNP (last 3 results)  Recent Labs  02/17/16 1435  BNP 983.3*   Basic Metabolic Panel:  Recent Labs Lab 07/15/16 0931 07/20/16 1635  NA 139 135  K 4.3 4.3  CL  --  101  CO2 25 22  GLUCOSE 111 145*  BUN 20.1 19  CREATININE 1.3* 1.35*  CALCIUM 10.1 9.3   Liver Function Tests:  Recent Labs Lab 07/15/16 0931 07/20/16 1635  AST 14 23  ALT 9 12*  ALKPHOS 84 78  BILITOT 0.48 0.6  PROT 7.9 7.6  ALBUMIN 2.9* 3.3*   No results for input(s): LIPASE, AMYLASE in the last 168 hours. No results for input(s): AMMONIA in the last 168 hours. CBC:  Recent Labs Lab 07/15/16 0931 07/20/16 1635 07/20/16 1938 07/21/16 0103 07/21/16 0652  WBC 4.9 5.9  4.8 4.5 3.7*  NEUTROABS 2.9  --   --   --   --   HGB 7.6* 8.0* 6.1* 7.0* 8.6*  HCT 22.5* 24.3* 18.4* 21.0* 25.8*  MCV 87.0 89.3 88.5 86.4 86.9  PLT 146 57* 44* 34* 32*   Cardiac Enzymes:  Recent Labs Lab 07/21/16 0100  TROPONINI <0.03   BNP: Invalid input(s): POCBNP CBG: No results for input(s): GLUCAP in the last 168 hours. D-Dimer No results for input(s): DDIMER in the last 72 hours. Hgb A1c No results for input(s): HGBA1C in the last 72 hours. Lipid Profile No results for input(s): CHOL, HDL, LDLCALC, TRIG, CHOLHDL, LDLDIRECT in the last 72 hours. Thyroid function studies No results for input(s): TSH, T4TOTAL, T3FREE, THYROIDAB in the last 72 hours.  Invalid input(s): FREET3 Anemia work up  Recent Labs  07/21/16 0100  FERRITIN 1,980*  TIBC 164*  IRON 59  RETICCTPCT 0.4   Urinalysis    Component Value Date/Time   COLORURINE YELLOW 07/20/2016 1957   APPEARANCEUR CLEAR 07/20/2016 1957   LABSPEC 1.012 07/20/2016 1957   LABSPEC 1.010 05/30/2016 1033   PHURINE 5.0 07/20/2016 1957   GLUCOSEU NEGATIVE 07/20/2016 1957   GLUCOSEU Negative 05/30/2016 1033   HGBUR NEGATIVE 07/20/2016 1957   BILIRUBINUR NEGATIVE 07/20/2016 1957   BILIRUBINUR Negative 05/30/2016 1033   KETONESUR NEGATIVE 07/20/2016 1957   PROTEINUR NEGATIVE 07/20/2016 1957   UROBILINOGEN 0.2 05/30/2016 1033   NITRITE NEGATIVE 07/20/2016 1957   LEUKOCYTESUR NEGATIVE 07/20/2016 1957   LEUKOCYTESUR Trace 05/30/2016 1033   Sepsis Labs Invalid input(s): PROCALCITONIN,  WBC,  LACTICIDVEN Microbiology No results found for this or any previous visit (from the past 240 hour(s)).   Time coordinating discharge: 30 minutes  SIGNED:  Chipper Oman, MD  [facility] 07/21/2016, 10:34 AM  Pager please text page via  [email or web address] Password [email or web address]

## 2016-07-21 NOTE — Progress Notes (Signed)
Patient arrived to unit receiving 1 unit of PRBC.  Patient's blood consent was not sent to unit.  Per patient she remembers signing the consent and per ED RN Clearence Ped. consent was sent to medical records office.  This RN educated the patient on the importance of having a copy of the consent to transfuse blood products and that the RN would get another consent signed if Patient has to receive any more blood.  Patient was agreeable.  Will continue to monitor patient and follower MD orders.

## 2016-07-21 NOTE — Progress Notes (Signed)
NURSING PROGRESS NOTE  Audrey Porter [id number] Admission Data: 07/21/2016 12:27 AM Attending Provider: Edwin Dada, * [id number], Mitzie Na, MD Code Status: Full  Allergies:  Sulfa antibiotics Past Medical History:   has a past medical history of Acid reflux; Acute saddle pulmonary embolism with acute cor pulmonale ([place]); Anemia associated with chemotherapy; Hypertension; and Ovarian ca ([place]). Past Surgical History:   has a past surgical history that includes Abdominal hysterectomy (03/31/14); Cholecystectomy (2000); Cesarean section (1990); ir generic historical (12/04/2015); ir generic historical (12/04/2015); and Nasal sinus surgery. Social History:   reports that she has never smoked. She has never used smokeless tobacco. She reports that she does not drink alcohol or use drugs.  Audrey Porter is a 56 y.o. female patient admitted from ED:   Last Documented Vital Signs: Blood pressure (!) 97/59, pulse (!) 103, temperature 99 F (37.2 C), temperature source Oral, resp. rate 20, height 5\' 5"  (1.651 m), weight 71.7 kg (158 lb), SpO2 100 %.  Cardiac Monitoring: Box # 25 in place. Cardiac monitor yields:sinus tachycardia.  IV Fluids:  IV in place, occlusive dsg intact without redness, IV cath right chest port none.   Skin: Appropriate for ethnicity and intact.  Patient reports a fall and feeling like her left shoulder might bruise.  At the time of assessment not bruising noted.  Will continue to monitor.  Patient/Family orientated to room. Information packet given to patient/family. Admission inpatient armband information verified with patient/family to include name and date of birth and placed on patient arm. Side rails up x 2, fall assessment and education completed with patient/family. Patient/family able to verbalize understanding of risk associated with falls and verbalized understanding to call for assistance before getting out of bed. Call light within reach. Patient/family  able to voice and demonstrate understanding of unit orientation instructions.    Will continue to evaluate and treat per MD orders.  Clydell Hakim RN, BSN

## 2016-07-21 NOTE — Progress Notes (Signed)
Audrey Porter to be D/C'd to home per MD order.  Discussed with the patient and all questions fully answered.  VSS, Skin clean, dry and intact without evidence of skin break down, no evidence of skin tears noted. IV catheter discontinued intact. Site without signs and symptoms of complications. Dressing and pressure applied.  An After Visit Summary was printed and given to the patient. Patient received prescription.  D/c education completed with patient/family including follow up instructions, medication list, d/c activities limitations if indicated, with other d/c instructions as indicated by MD - patient able to verbalize understanding, all questions fully answered.   Patient instructed to return to ED, call 911, or call MD for any changes in condition.   Patient awaiting ride. Will be going to [facility] by car with husband for MRI scan.  [name] 07/21/2016 12:45 PM

## 2016-07-22 ENCOUNTER — Ambulatory Visit: Payer: Managed Care, Other (non HMO)

## 2016-07-22 ENCOUNTER — Ambulatory Visit: Payer: Managed Care, Other (non HMO) | Admitting: Hematology and Oncology

## 2016-07-22 LAB — TYPE AND SCREEN
ABO/RH(D): B POS
ANTIBODY SCREEN: NEGATIVE
Unit division: 0
Unit division: 0

## 2016-07-22 LAB — BPAM RBC
Blood Product Expiration Date: 201807012359
Blood Product Expiration Date: 201807062359
ISSUE DATE / TIME: 201806242005
ISSUE DATE / TIME: 201806250406
UNIT TYPE AND RH: 5100
Unit Type and Rh: 5100

## 2016-07-22 NOTE — Progress Notes (Unsigned)
Patients appts canceled for today, porta cath de accessed from hospitalization yesterday.

## 2016-07-24 ENCOUNTER — Observation Stay (HOSPITAL_COMMUNITY)
Admission: EM | Admit: 2016-07-24 | Discharge: 2016-07-25 | Disposition: A | Discharge status: Home / Self Care | Payer: Managed Care, Other (non HMO) | Attending: Internal Medicine | Admitting: Internal Medicine

## 2016-07-24 ENCOUNTER — Encounter (HOSPITAL_COMMUNITY): Payer: Self-pay | Admitting: Emergency Medicine

## 2016-07-24 ENCOUNTER — Other Ambulatory Visit: Payer: Self-pay

## 2016-07-24 DIAGNOSIS — N179 Acute kidney failure, unspecified: Secondary | ICD-10-CM | POA: Diagnosis present

## 2016-07-24 DIAGNOSIS — R55 Syncope and collapse: Secondary | ICD-10-CM | POA: Diagnosis present

## 2016-07-24 DIAGNOSIS — K219 Gastro-esophageal reflux disease without esophagitis: Secondary | ICD-10-CM | POA: Diagnosis not present

## 2016-07-24 DIAGNOSIS — D696 Thrombocytopenia, unspecified: Secondary | ICD-10-CM | POA: Diagnosis present

## 2016-07-24 DIAGNOSIS — Z86718 Personal history of other venous thrombosis and embolism: Secondary | ICD-10-CM | POA: Diagnosis not present

## 2016-07-24 DIAGNOSIS — T451X5A Adverse effect of antineoplastic and immunosuppressive drugs, initial encounter: Secondary | ICD-10-CM | POA: Diagnosis not present

## 2016-07-24 DIAGNOSIS — I4581 Long QT syndrome: Secondary | ICD-10-CM | POA: Insufficient documentation

## 2016-07-24 DIAGNOSIS — D701 Agranulocytosis secondary to cancer chemotherapy: Secondary | ICD-10-CM | POA: Insufficient documentation

## 2016-07-24 DIAGNOSIS — Z801 Family history of malignant neoplasm of trachea, bronchus and lung: Secondary | ICD-10-CM | POA: Insufficient documentation

## 2016-07-24 DIAGNOSIS — I129 Hypertensive chronic kidney disease with stage 1 through stage 4 chronic kidney disease, or unspecified chronic kidney disease: Secondary | ICD-10-CM | POA: Insufficient documentation

## 2016-07-24 DIAGNOSIS — N6019 Diffuse cystic mastopathy of unspecified breast: Secondary | ICD-10-CM | POA: Insufficient documentation

## 2016-07-24 DIAGNOSIS — N189 Chronic kidney disease, unspecified: Secondary | ICD-10-CM | POA: Diagnosis not present

## 2016-07-24 DIAGNOSIS — N135 Crossing vessel and stricture of ureter without hydronephrosis: Secondary | ICD-10-CM | POA: Insufficient documentation

## 2016-07-24 DIAGNOSIS — J45991 Cough variant asthma: Secondary | ICD-10-CM | POA: Insufficient documentation

## 2016-07-24 DIAGNOSIS — Z7901 Long term (current) use of anticoagulants: Secondary | ICD-10-CM | POA: Insufficient documentation

## 2016-07-24 DIAGNOSIS — R112 Nausea with vomiting, unspecified: Secondary | ICD-10-CM | POA: Diagnosis not present

## 2016-07-24 DIAGNOSIS — K5900 Constipation, unspecified: Secondary | ICD-10-CM | POA: Diagnosis not present

## 2016-07-24 DIAGNOSIS — E860 Dehydration: Secondary | ICD-10-CM | POA: Diagnosis present

## 2016-07-24 DIAGNOSIS — R627 Adult failure to thrive: Secondary | ICD-10-CM | POA: Diagnosis not present

## 2016-07-24 DIAGNOSIS — I2692 Saddle embolus of pulmonary artery without acute cor pulmonale: Secondary | ICD-10-CM | POA: Diagnosis not present

## 2016-07-24 DIAGNOSIS — C569 Malignant neoplasm of unspecified ovary: Secondary | ICD-10-CM | POA: Diagnosis not present

## 2016-07-24 DIAGNOSIS — D6481 Anemia due to antineoplastic chemotherapy: Secondary | ICD-10-CM | POA: Diagnosis not present

## 2016-07-24 DIAGNOSIS — Z800 Family history of malignant neoplasm of digestive organs: Secondary | ICD-10-CM | POA: Insufficient documentation

## 2016-07-24 DIAGNOSIS — Z79899 Other long term (current) drug therapy: Secondary | ICD-10-CM | POA: Insufficient documentation

## 2016-07-24 DIAGNOSIS — Z803 Family history of malignant neoplasm of breast: Secondary | ICD-10-CM | POA: Insufficient documentation

## 2016-07-24 DIAGNOSIS — G620 Drug-induced polyneuropathy: Secondary | ICD-10-CM | POA: Insufficient documentation

## 2016-07-24 DIAGNOSIS — R64 Cachexia: Secondary | ICD-10-CM | POA: Diagnosis not present

## 2016-07-24 DIAGNOSIS — I491 Atrial premature depolarization: Secondary | ICD-10-CM | POA: Diagnosis not present

## 2016-07-24 DIAGNOSIS — D649 Anemia, unspecified: Secondary | ICD-10-CM

## 2016-07-24 DIAGNOSIS — E440 Moderate protein-calorie malnutrition: Secondary | ICD-10-CM | POA: Insufficient documentation

## 2016-07-24 DIAGNOSIS — Z882 Allergy status to sulfonamides status: Secondary | ICD-10-CM | POA: Insufficient documentation

## 2016-07-24 DIAGNOSIS — Z806 Family history of leukemia: Secondary | ICD-10-CM | POA: Insufficient documentation

## 2016-07-24 DIAGNOSIS — N183 Chronic kidney disease, stage 3 (moderate): Secondary | ICD-10-CM | POA: Diagnosis not present

## 2016-07-24 DIAGNOSIS — I8290 Acute embolism and thrombosis of unspecified vein: Secondary | ICD-10-CM | POA: Diagnosis not present

## 2016-07-24 DIAGNOSIS — Z90710 Acquired absence of both cervix and uterus: Secondary | ICD-10-CM | POA: Insufficient documentation

## 2016-07-24 LAB — CBC
HCT: 16.1 % — ABNORMAL LOW (ref 36.0–46.0)
Hemoglobin: 5.5 g/dL — CL (ref 12.0–15.0)
MCH: 29.9 pg (ref 26.0–34.0)
MCHC: 34.2 g/dL (ref 30.0–36.0)
MCV: 87.5 fL (ref 78.0–100.0)
PLATELETS: 88 10*3/uL — AB (ref 150–400)
RBC: 1.84 MIL/uL — AB (ref 3.87–5.11)
RDW: 16.9 % — AB (ref 11.5–15.5)
WBC: 9.5 10*3/uL (ref 4.0–10.5)

## 2016-07-24 LAB — BASIC METABOLIC PANEL
Anion gap: 10 (ref 5–15)
BUN: 35 mg/dL — AB (ref 6–20)
CALCIUM: 8.9 mg/dL (ref 8.9–10.3)
CO2: 22 mmol/L (ref 22–32)
CREATININE: 2.06 mg/dL — AB (ref 0.44–1.00)
Chloride: 103 mmol/L (ref 101–111)
GFR calc non Af Amer: 26 mL/min — ABNORMAL LOW (ref >60)
GFR, EST AFRICAN AMERICAN: 30 mL/min — AB (ref >60)
Glucose, Bld: 149 mg/dL — ABNORMAL HIGH (ref 65–99)
Potassium: 4.9 mmol/L (ref 3.5–5.1)
SODIUM: 135 mmol/L (ref 135–145)

## 2016-07-24 LAB — URINALYSIS, ROUTINE W REFLEX MICROSCOPIC
BILIRUBIN URINE: NEGATIVE
Bacteria, UA: NONE SEEN
Glucose, UA: NEGATIVE mg/dL
HGB URINE DIPSTICK: NEGATIVE
Ketones, ur: NEGATIVE mg/dL
Nitrite: NEGATIVE
Protein, ur: NEGATIVE mg/dL
SPECIFIC GRAVITY, URINE: 1.015 (ref 1.005–1.030)
pH: 5 (ref 5.0–8.0)

## 2016-07-24 LAB — DIC (DISSEMINATED INTRAVASCULAR COAGULATION) PANEL
APTT: 61 s — AB (ref 24–36)
FIBRINOGEN: 574 mg/dL — AB (ref 210–475)
INR: 1.96
PLATELETS: 69 10*3/uL — AB (ref 150–400)
PROTHROMBIN TIME: 22.6 s — AB (ref 11.4–15.2)

## 2016-07-24 LAB — I-STAT CG4 LACTIC ACID, ED: Lactic Acid, Venous: 1.12 mmol/L (ref 0.5–1.9)

## 2016-07-24 LAB — DIFFERENTIAL
BASOS ABS: 0 10*3/uL (ref 0.0–0.1)
Basophils Relative: 0 %
EOS PCT: 0 %
Eosinophils Absolute: 0 10*3/uL (ref 0.0–0.7)
LYMPHS ABS: 1 10*3/uL (ref 0.7–4.0)
Lymphocytes Relative: 11 %
Monocytes Absolute: 1.4 10*3/uL — ABNORMAL HIGH (ref 0.1–1.0)
Monocytes Relative: 15 %
NEUTROS ABS: 7.1 10*3/uL (ref 1.7–7.7)
Neutrophils Relative %: 74 %

## 2016-07-24 LAB — DIC (DISSEMINATED INTRAVASCULAR COAGULATION)PANEL
D-Dimer, Quant: 3.09 ug/mL-FEU — ABNORMAL HIGH (ref 0.00–0.50)
Smear Review: NONE SEEN

## 2016-07-24 LAB — CBG MONITORING, ED: Glucose-Capillary: 127 mg/dL — ABNORMAL HIGH (ref 65–99)

## 2016-07-24 LAB — PREPARE RBC (CROSSMATCH)

## 2016-07-24 MED ORDER — RIVAROXABAN 20 MG PO TABS
20.0000 mg | ORAL_TABLET | Freq: Every day | ORAL | Status: DC
  Administered 2016-07-24: 20 mg via ORAL
  Filled 2016-07-24: qty 1

## 2016-07-24 MED ORDER — POLYETHYLENE GLYCOL 3350 17 G PO PACK: 17.0000 g | PACK | Freq: Every day | ORAL | Status: DC | PRN

## 2016-07-24 MED ORDER — ACETAMINOPHEN 500 MG PO TABS
1000.0000 mg | ORAL_TABLET | Freq: Four times a day (QID) | ORAL | Status: DC | PRN
  Administered 2016-07-25: 1000 mg via ORAL
  Filled 2016-07-24: qty 2

## 2016-07-24 MED ORDER — HYDROMORPHONE HCL 2 MG PO TABS
2.0000 mg | ORAL_TABLET | ORAL | Status: DC | PRN
  Administered 2016-07-24 – 2016-07-25 (×2): 2 mg via ORAL
  Filled 2016-07-24 (×2): qty 1

## 2016-07-24 MED ORDER — FENTANYL CITRATE (PF) 100 MCG/2ML IJ SOLN: 50.0000 ug | INTRAMUSCULAR | Status: DC | PRN

## 2016-07-24 MED ORDER — FLUTICASONE PROPIONATE 50 MCG/ACT NA SUSP: 1.0000 | Freq: Every day | NASAL | Status: DC | PRN

## 2016-07-24 MED ORDER — SODIUM CHLORIDE 0.9 % IV SOLN: Freq: Once | INTRAVENOUS | Status: DC

## 2016-07-24 MED ORDER — PANTOPRAZOLE SODIUM 40 MG PO TBEC
40.0000 mg | DELAYED_RELEASE_TABLET | Freq: Every day | ORAL | Status: DC
  Administered 2016-07-24: 40 mg via ORAL
  Filled 2016-07-24: qty 1

## 2016-07-24 MED ORDER — SODIUM CHLORIDE 0.9 % IV BOLUS (SEPSIS)
1000.0000 mL | INTRAVENOUS | Status: AC
  Administered 2016-07-24: 1000 mL via INTRAVENOUS

## 2016-07-24 MED ORDER — ONDANSETRON HCL 4 MG/2ML IJ SOLN: 4.0000 mg | Freq: Four times a day (QID) | INTRAMUSCULAR | Status: DC | PRN

## 2016-07-24 MED ORDER — PROCHLORPERAZINE MALEATE 10 MG PO TABS: 10.0000 mg | ORAL_TABLET | Freq: Four times a day (QID) | ORAL | Status: DC | PRN

## 2016-07-24 MED ORDER — LORAZEPAM 0.5 MG PO TABS: 0.5000 mg | ORAL_TABLET | Freq: Four times a day (QID) | ORAL | Status: DC | PRN

## 2016-07-24 MED ORDER — LIDOCAINE-PRILOCAINE 2.5-2.5 % EX CREA: 1.0000 "application " | TOPICAL_CREAM | Freq: Once | CUTANEOUS | Status: DC | PRN

## 2016-07-24 MED ORDER — ALBUTEROL SULFATE (2.5 MG/3ML) 0.083% IN NEBU: 2.5000 mg | INHALATION_SOLUTION | RESPIRATORY_TRACT | Status: DC | PRN

## 2016-07-24 MED ORDER — SODIUM CHLORIDE 0.9 % IV SOLN
INTRAVENOUS | Status: DC
  Administered 2016-07-24 – 2016-07-25 (×2): via INTRAVENOUS

## 2016-07-24 MED ORDER — LACTATED RINGERS IV BOLUS (SEPSIS): 1000.0000 mL | Freq: Once | INTRAVENOUS | Status: DC

## 2016-07-24 NOTE — ED Notes (Signed)
Marie in main lab confirms able to add on diff to CBC in lab.

## 2016-07-24 NOTE — H&P (Signed)
History and Physical    Audrey Porter GUR:[id number] DOB: 1960-02-02 DOA: 07/24/2016  Referring MD/NP/PA: Dr. Theotis Burrow  PCP: Caryl Bis, MD  Patient coming from: Home via EMS  Chief Complaint: Syncope  HPI: Audrey Porter is a 56 y.o. female with medical history significant of  Ovarian Cancer on gemcitabine, chemo-induced anemia, and CKD; who presents with complaints of passing out. Patient was just last admitted into the hospital from 6/24-6/25 for syncope found to have chemotherapy-induced anemia for which she was transfused 2 units of blood and discharged home.  Since being home patient noted that she has not felt well and is dizzy whenever she stands or changes positions. She passed out and hit her left elbow and shoulder for which she has bruises. Denies any significant trauma to her head. She continues to have nausea and vomiting and is unable to keep any significant amount of food or liquids down. Associated symptoms include shortness of breath , constipation, and back pain. She denies any blood in stool/urine, leg swelling, chest pain, fever, or dysuria. Patient notes that this will be her third transfusion in the last 2 weeks. Patient is followed by Dr. Alvy Bimler of oncology. She was previously supposed get an infusion of chemotherapy on June 26, but due to her low blood counts this was canceled. Her next scheduled chemotherapy therapy infusion is set for July 10.   ED Course:  Upon admission into the emergency department patient was seen to be afebrile, heart rates 109-121, respirations up to 24, and other vital signs within normal limits. Labs revealed hemoglobin of 5.5, platelets 69, BUN 35, creatinine 2.05. Patient wasgiven 1 liter of NS IV fluids, and ordered to be transfused 2 units of PRBCs. Review of records shows that patient's last MRI of the brain was negative for any acute abnormalities.  Review of Systems: As per HPI otherwise 10 point review of systems negative.    Past Medical History:  Diagnosis Date  . Acid reflux   . Acute saddle pulmonary embolism with acute cor pulmonale (HCC)   . Anemia associated with chemotherapy   . Hypertension   . Ovarian ca [facility])     Past Surgical History:  Procedure Laterality Date  . ABDOMINAL HYSTERECTOMY  03/31/14   at [facility]  . CESAREAN SECTION  1990  . CHOLECYSTECTOMY  2000  . IR GENERIC HISTORICAL  12/04/2015   IR FLUORO GUIDE PORT INSERTION RIGHT 12/04/2015 Greggory Keen, MD [facility]  . IR GENERIC HISTORICAL  12/04/2015   IR US GUIDE VASC ACCESS RIGHT 12/04/2015 Greggory Keen, MD [facility]  . NASAL SINUS SURGERY       reports that she has never smoked. She has never used smokeless tobacco. She reports that she does not drink alcohol or use drugs.  Allergies  Allergen Reactions  . Sulfa Antibiotics Hives    Family History  Problem Relation Age of Onset  . Cancer Brother 22       lung smoker  . Cancer Paternal Aunt 66       breast  . Cancer Maternal Grandmother 25       leukemia  . Cancer Paternal Grandmother        colorectal at unknown age    Prior to Admission medications   Medication Sig Start Date End Date Taking? Authorizing Provider  acetaminophen (TYLENOL) 500 MG tablet Take 1,000 mg by mouth every 6 (six) hours as needed for mild pain, moderate pain, fever or headache.  Yes [provider]  cetirizine (ZYRTEC) 10 MG tablet Take 10 mg by mouth at bedtime.    Yes [provider]  fluticasone (FLONASE) 50 MCG/ACT nasal spray Place 1 spray into both nostrils daily as needed. Patient taking differently: Place 1 spray into both nostrils daily as needed for rhinitis.  12/21/15  Yes Livesay, [name], MD  Gemcitabine HCl (GEMZAR IV) Inject into the vein every 28 (twenty-eight) days. Gemzar 1,482mg  in 0.9% sodium chloride 264mL chemo infusion.   To be given at 570mL/hr over 30 minutes.    Yes [provider]  HYDROmorphone (DILAUDID) 2 MG tablet Take 1 tablet  (2 mg total) by mouth every 4 (four) hours as needed for severe pain. 04/04/16  Yes Gorsuch, Ni, MD  lidocaine-prilocaine (EMLA) cream Apply 1-2 hours prior to [facility] access as directed. Patient taking differently: Apply 1 application topically once as needed. 1-2 hours prior to port access 12/03/15  Yes Livesay, [name], MD  LORazepam (ATIVAN) 0.5 MG tablet Take 1 tablet (0.5 mg total) by mouth every 6 (six) hours as needed for anxiety. 04/08/16  Yes Gorsuch, Ni, MD  magnesium hydroxide (MILK OF MAGNESIA) 400 MG/5ML suspension Take 15 mLs by mouth daily as needed for mild constipation.   Yes [provider]  omeprazole (PRILOSEC) 20 MG capsule Take 20-40 mg by mouth at bedtime.    Yes [provider]  ondansetron (ZOFRAN) 8 MG tablet Take 1 tablet (8 mg total) by mouth every 8 (eight) hours as needed for nausea (Will not make drowsy). 12/03/15  Yes Livesay, [name], MD  polyethylene glycol (MIRALAX / GLYCOLAX) packet Take 17 g by mouth daily as needed for mild constipation.    Yes [provider]  prochlorperazine (COMPAZINE) 10 MG tablet Take 1 tablet (10 mg total) by mouth every 6 (six) hours as needed. Patient taking differently: Take 10 mg by mouth every 6 (six) hours as needed for nausea or vomiting.  07/15/16  Yes Heath Lark, MD  rivaroxaban (XARELTO) 20 MG TABS tablet Take 1 tablet (20 mg total) by mouth daily with supper. 05/30/16  Yes Gorsuch, Ni, MD  mirtazapine (REMERON) 30 MG tablet Take 1 tablet (30 mg total) by mouth at bedtime. 06/30/16   Heath Lark, MD    Physical Exam: Constitutional: Chronically ill-appearing female who appears tired and fatigued. Vitals:   07/24/16 1300 07/24/16 1330 07/24/16 1400 07/24/16 1415  BP: 103/80 107/82 114/69   Pulse: (!) 118 (!) 114  (!) 109  Resp: (!) 22 (!) 24  (!) 21  Temp:      TempSrc:      SpO2: 100% 100%  100%  Weight:      Height:       Eyes: PERRL, lids and conjunctivae normal ENMT: Mucous membranes are  dry. Posterior pharynx clear of any exudate or lesions.  Neck: normal, supple, no masses, no thyromegaly Respiratory: Mildly tachypneic without any expiratory wheezes appreciated. Cardiovascular: Tachycardic, no murmurs / rubs / gallops. No extremity edema. 2+ pedal pulses. No carotid bruits. Port of the right upper chest wall Abdomen: no tenderness, no masses palpated. No hepatosplenomegaly. Bowel sounds positive.  Musculoskeletal: no clubbing / cyanosis. No joint deformity upper and lower extremities. Good ROM, no contractures. Normal muscle tone.  Skin: Pallor.  No rashes, lesions, ulcers. No induration Neurologic: CN 2-12 grossly intact. Sensation intact, DTR normal. Strength 5/5 in all 4.  Psychiatric: Normal judgment and insight. Alert and oriented x 3. Normal mood.  Labs on Admission: I have personally reviewed following labs and imaging studies  CBC:  Recent Labs Lab 07/20/16 1635 07/20/16 1938 07/21/16 0103 07/21/16 0652 07/24/16 1227  WBC 5.9 4.8 4.5 3.7* 9.5  HGB 8.0* 6.1* 7.0* 8.6* 5.5*  HCT 24.3* 18.4* 21.0* 25.8* 16.1*  MCV 89.3 88.5 86.4 86.9 87.5  PLT 57* 44* 34* 32* PENDING   Basic Metabolic Panel:  Recent Labs Lab 07/20/16 1635 07/24/16 1227  NA 135 135  K 4.3 4.9  CL 101 103  CO2 22 22  GLUCOSE 145* 149*  BUN 19 35*  CREATININE 1.35* 2.06*  CALCIUM 9.3 8.9   GFR: Estimated Creatinine Clearance: 29.9 mL/min (A) (by C-G formula based on SCr of 2.06 mg/dL (H)). Liver Function Tests:  Recent Labs Lab 07/20/16 1635  AST 23  ALT 12*  ALKPHOS 78  BILITOT 0.6  PROT 7.6  ALBUMIN 3.3*   No results for input(s): LIPASE, AMYLASE in the last 168 hours. No results for input(s): AMMONIA in the last 168 hours. Coagulation Profile: No results for input(s): INR, PROTIME in the last 168 hours. Cardiac Enzymes:  Recent Labs Lab 07/21/16 0100  TROPONINI <0.03   BNP (last 3 results) No results for input(s): PROBNP in the last 8760  hours. HbA1C: No results for input(s): HGBA1C in the last 72 hours. CBG:  Recent Labs Lab 07/24/16 1229  GLUCAP 127*   Lipid Profile: No results for input(s): CHOL, HDL, LDLCALC, TRIG, CHOLHDL, LDLDIRECT in the last 72 hours. Thyroid Function Tests: No results for input(s): TSH, T4TOTAL, FREET4, T3FREE, THYROIDAB in the last 72 hours. Anemia Panel: No results for input(s): VITAMINB12, FOLATE, FERRITIN, TIBC, IRON, RETICCTPCT in the last 72 hours. Urine analysis:    Component Value Date/Time   COLORURINE YELLOW 07/20/2016 1957   APPEARANCEUR CLEAR 07/20/2016 1957   LABSPEC 1.012 07/20/2016 1957   LABSPEC 1.010 05/30/2016 1033   PHURINE 5.0 07/20/2016 1957   GLUCOSEU NEGATIVE 07/20/2016 1957   GLUCOSEU Negative 05/30/2016 1033   HGBUR NEGATIVE 07/20/2016 1957   BILIRUBINUR NEGATIVE 07/20/2016 1957   BILIRUBINUR Negative 05/30/2016 1033   KETONESUR NEGATIVE 07/20/2016 1957   PROTEINUR NEGATIVE 07/20/2016 1957   UROBILINOGEN 0.2 05/30/2016 1033   NITRITE NEGATIVE 07/20/2016 1957   LEUKOCYTESUR NEGATIVE 07/20/2016 1957   LEUKOCYTESUR Trace 05/30/2016 1033   Sepsis Labs: No results found for this or any previous visit (from the past 240 hour(s)).   Radiological Exams on Admission: No results found.  EKG: Independently reviewed. Sinus tachycardia with premature atrial complexs.  Assessment/Plan Chemotherapy-induced Anemia: Recurrent. Patient just recently admitted to the hospital on 6/24-6/25 for the same. - Admit to a telemetry bed - Continue transfusion of 2 units of PRBCs - Recheck CBC in a.m. - Notified Dr. Alvy Bimler who recommended monitoring overnight and possible discharge in a.m. if blood count stable. She recommends to call if needed in a.m.  Acute kidney injury on chronic kidney disease stage III: Baseline creatinine1- 1.5, but patient presents with a creatinine 2.05, and BUN 35. Suspect this is secondary to dehydration and hypovolemia. - Gentle IV fluids -  Recheck BMP in a.m.  Nausea and vomiting - Antiemetics as needed  Ovarian cancer: Patient with recent MRI of the brain which shows no acute abnormalities.  - Continue follow-up with Dr.Gorsuch   Vasovagal syncope  - Follow-up telemetry overnight  H/o recurrent VTE: Based off recent admission stool guaiacs were negative at that time, and patient denies any bowel movement. - Continue Xarelto   Thrombocytopenia:  Platelet count 69 which is improved from previous lately count 32.  - Continue to monitor   DVT prophylaxis: Xarelto   Code Status: Full  Family Communication: Discussed plan of care with the patient family present at bedside Disposition Plan: liklely discharge home in a.m.  Consults called: None Admission status: observation  Norval Morton MD [facility] Pager [phone]  If 7PM-7AM, please contact night-coverage [email or web address] Password [facility]  07/24/2016, 2:24 PM

## 2016-07-24 NOTE — ED Provider Notes (Signed)
[facility] DEPT Provider Note   CSN: [id number] Arrival date & time: 07/24/16  1158     History   Chief Complaint Chief Complaint  Patient presents with  . Near Syncope    HPI JALEE SAINE is a 56 y.o. female.  56 year old female with past medical history including ovarian cancer on chemotherapy, PE, hypertension, GERD who presents with nausea, vomiting, diarrhea, and dizziness. The patient was admitted to the hospital on 6/19 for symptomatic anemia including a syncopal episode thought to be related to low blood count. She received 2 units of blood and was discharged home. She has continued to have nausea, vomiting, and nonbloody diarrhea since then, although today she states that she has been constipated. She denies any urinary symptoms. She notes that she continues to have dizziness which she describes as lightheadedness every time she stands up or tries to walk around. She also has exertional shortness of breath which she has had previously when she was anemic. She states that when she was walking in her house last night she had a syncopal episode, did not strike her head, fell onto her left side and bruised her shoulder. Today she walked to her kitchen and sat down and then her husband witnessed another brief syncopal episode, she did not fall. She denies any shortness of breath at rest. No associated chest pain. No fever or cough/cold symptoms.   The history is provided by the patient.    Past Medical History:  Diagnosis Date  . Acid reflux   . Acute saddle pulmonary embolism with acute cor pulmonale (HCC)   . Anemia associated with chemotherapy   . Hypertension   . Ovarian ca [facility])     Patient Active Problem List   Diagnosis Date Noted  . Syncope, vasovagal 07/20/2016  . Thrombocytopenia ([place]) 07/20/2016  . Nausea without vomiting 07/16/2016  . Moderate protein-calorie malnutrition ([place]) 07/16/2016  . Goals of care, counseling/discussion 06/30/2016  . Malignant  cachexia ([place]) 06/30/2016  . Dysuria 05/30/2016  . Atypical chest pain 04/04/2016  . Cough variant asthma 03/26/2016  . Hypercalcemia 03/09/2016  . Acute deep vein thrombosis (DVT) of left femoral vein ([place]) 02/26/2016  . Poor nutrition 02/26/2016  . Acute saddle pulmonary embolism with acute cor pulmonale ([place]) 02/17/2016  . Iron deficiency anemia due to chronic blood loss 12/22/2015  . Port catheter in place 12/13/2015  . Ovarian cancer ([place]) 12/02/2015  . CKD (chronic kidney disease) stage 3, GFR 30-59 ml/min 12/02/2015  . 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 PICC (peripherally inserted central catheter) flush 08/25/2014  . Fibrocystic breast determined by biopsy 08/05/2014  . Anemia due to antineoplastic chemotherapy 08/03/2014  . Poor venous access 08/03/2014  . Drug-induced hypotension 05/27/2014  . Chemotherapy induced neutropenia ([place]) 05/27/2014  . 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 Family history of malignant neoplasm of breast 05/12/2014  . Family history of malignant neoplasm of gastrointestinal tract 05/12/2014  . Hypersensitivity reaction 05/12/2014  . Environmental allergies 05/07/2014  . Gastroesophageal reflux disease without esophagitis 05/07/2014  . HTN (hypertension), benign 05/07/2014  . 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 Status post cholecystectomy 05/07/2014  . Ovarian cancer, bilateral ([place]) 04/26/2014    Past Surgical History:  Procedure Laterality Date  . ABDOMINAL HYSTERECTOMY  03/31/14   at [facility]  . CESAREAN SECTION  1990  . CHOLECYSTECTOMY  2000  . IR GENERIC HISTORICAL  12/04/2015   IR FLUORO GUIDE PORT INSERTION RIGHT 12/04/2015 Greggory Keen, MD [facility]  .  IR GENERIC HISTORICAL  12/04/2015   IR US GUIDE VASC ACCESS RIGHT 12/04/2015 Greggory Keen, MD [facility]  . NASAL SINUS SURGERY      OB History    No data available       Home Medications    Prior to Admission medications     Medication Sig Start Date End Date Taking? Authorizing Provider  acetaminophen (TYLENOL) 500 MG tablet Take 1,000 mg by mouth every 6 (six) hours as needed for mild pain, moderate pain, fever or headache.    Yes [provider]  cetirizine (ZYRTEC) 10 MG tablet Take 10 mg by mouth at bedtime.    Yes [provider]  fluticasone (FLONASE) 50 MCG/ACT nasal spray Place 1 spray into both nostrils daily as needed. Patient taking differently: Place 1 spray into both nostrils daily as needed for rhinitis.  12/21/15  Yes Livesay, [name], MD  Gemcitabine HCl (GEMZAR IV) Inject into the vein every 28 (twenty-eight) days. Gemzar 1,'482mg'$  in 0.9% sodium chloride 282m chemo infusion.   To be given at 5741mhr over 30 minutes.    Yes [provider]  HYDROmorphone (DILAUDID) 2 MG tablet Take 1 tablet (2 mg total) by mouth every 4 (four) hours as needed for severe pain. 04/04/16  Yes Gorsuch, Ni, MD  lidocaine-prilocaine (EMLA) cream Apply 1-2 hours prior [facility] as directed. Patient taking differently: Apply 1 application topically once as needed. 1-2 hours prior to port access 12/03/15  Yes Livesay, [name], MD  LORazepam (ATIVAN) 0.5 MG tablet Take 1 tablet (0.5 mg total) by mouth every 6 (six) hours as needed for anxiety. 04/08/16  Yes Gorsuch, Ni, MD  magnesium hydroxide (MILK OF MAGNESIA) 400 MG/5ML suspension Take 15 mLs by mouth daily as needed for mild constipation.   Yes [provider]  omeprazole (PRILOSEC) 20 MG capsule Take 20-40 mg by mouth at bedtime.    Yes [provider]  ondansetron (ZOFRAN) 8 MG tablet Take 1 tablet (8 mg total) by mouth every 8 (eight) hours as needed for nausea (Will not make drowsy). 12/03/15  Yes Livesay, [name], MD  polyethylene glycol (MIRALAX / GLYCOLAX) packet Take 17 g by mouth daily as needed for mild constipation.    Yes [provider]  prochlorperazine (COMPAZINE) 10 MG tablet Take 1 tablet (10 mg  total) by mouth every 6 (six) hours as needed. Patient taking differently: Take 10 mg by mouth every 6 (six) hours as needed for nausea or vomiting.  07/15/16  Yes GoHeath LarkMD  rivaroxaban (XARELTO) 20 MG TABS tablet Take 1 tablet (20 mg total) by mouth daily with supper. 05/30/16  Yes Gorsuch, Ni, MD  mirtazapine (REMERON) 30 MG tablet Take 1 tablet (30 mg total) by mouth at bedtime. 06/30/16   GoHeath LarkMD    Family History Family History  Problem Relation Age of Onset  . Cancer Brother [id number]     lung smoker  . Cancer Paternal Aunt 6032     breast  . Cancer Maternal Grandmother 25       leukemia  . Cancer Paternal Grandmother        colorectal at unknown age    Social History Social History  Substance Use Topics  . Smoking status: Never Smoker  . Smokeless tobacco: Never Used  . Alcohol use No     Allergies   Sulfa antibiotics   Review of Systems Review of Systems All other systems  reviewed and are negative except that which was mentioned in HPI   Physical Exam Updated Vital Signs BP 114/69   Pulse (!) 109   Temp 97.5 F (36.4 C) (Oral)   Resp (!) 21   Ht '5\' 5"'$  (1.651 m)   Wt 69.9 kg (154 lb)   SpO2 100%   BMI 25.63 kg/m   Physical Exam  Constitutional: She is oriented to person, place, and time. She appears well-developed and well-nourished. No distress.  HENT:  Head: Normocephalic and atraumatic.  Pale tongue  Eyes: Pupils are equal, round, and reactive to light.  Pale conjunctivae  Neck: Neck supple.  Cardiovascular: Regular rhythm and normal heart sounds.  Tachycardia present.   No murmur heard. Pulmonary/Chest: Effort normal and breath sounds normal.  Abdominal: Soft. Bowel sounds are normal. She exhibits no distension. There is no tenderness.  Musculoskeletal: She exhibits no edema.  Neurological: She is alert and oriented to person, place, and time.  Fluent speech  Skin: Skin is warm and dry. There is pallor.  Psychiatric: She has a normal  mood and affect. Judgment normal.  Nursing note and vitals reviewed.    ED Treatments / Results  Labs (all labs ordered are listed, but only abnormal results are displayed) Labs Reviewed  BASIC METABOLIC PANEL - Abnormal; Notable for the following:       Result Value   Glucose, Bld 149 (*)    BUN 35 (*)    Creatinine, Ser 2.06 (*)    GFR calc non Af Amer 26 (*)    GFR calc Af Amer 30 (*)    All other components within normal limits  CBC - Abnormal; Notable for the following:    RBC 1.84 (*)    Hemoglobin 5.5 (*)    HCT 16.1 (*)    RDW 16.9 (*)    All other components within normal limits  CBG MONITORING, ED - Abnormal; Notable for the following:    Glucose-Capillary 127 (*)    All other components within normal limits  URINALYSIS, ROUTINE W REFLEX MICROSCOPIC  DIFFERENTIAL  DIC (DISSEMINATED INTRAVASCULAR COAGULATION) PANEL  I-STAT CG4 LACTIC ACID, ED  TYPE AND SCREEN  PREPARE RBC (CROSSMATCH)    EKG  EKG Interpretation None       Radiology No results found.  Procedures .Critical Care Performed by: Sharlett Iles Authorized by: Sharlett Iles   Critical care provider statement:    Critical care time (minutes):  30   Critical care time was exclusive of:  Separately billable procedures and treating other patients   Critical care was necessary to treat or prevent imminent or life-threatening deterioration of the following conditions: symptomatic anemia.   Critical care was time spent personally by me on the following activities:  Development of treatment plan with patient or surrogate, examination of patient, obtaining history from patient or surrogate, ordering and performing treatments and interventions, ordering and review of laboratory studies and review of old charts    (including critical care time)  Medications Ordered in ED Medications  0.9 %  sodium chloride infusion (not administered)  sodium chloride 0.9 % bolus 1,000 mL (0 mLs  Intravenous Paused 07/24/16 1404)     Initial Impression / Assessment and Plan / ED Course  I have reviewed the triage vital signs and the nursing notes.  Pertinent labs  that were available during my care of the patient were reviewed by me and considered in my medical decision making (see chart for details).  This is a pleasant 56yo F on chemo for ovarian CA who I Admitted to the hospital last week for symptomatic anemia, returns today for similar symptoms. She has had nausea and vomiting and has not been able to eat or drink much. She was nontoxic on exam, tachycardic in the 110s, BP 107/82. She was afebrile and denied any complaints at rest. She states that all of her symptoms are similar to previous episodes where she was anemic. Her labs show worsening creatinine at 2.06 with BUN 35, likely due to her poor intake because of vomiting. Hemoglobin 5.5. Obtained consent and transfuse 2 units pRBCs. She denies any bloody stools and had a negative Hemoccult last time she was here. I have added DIC labs to evaluate for hemolytic process.  Discussed with [facility], Dr. Tamala Julian, and pt admitted for  Further care. Final Clinical Impressions(s) / ED Diagnoses   Final diagnoses:  Syncope and collapse  Symptomatic anemia  AKI (acute kidney injury) ([place])  Dehydration  Nausea and vomiting, intractability of vomiting not specified, unspecified vomiting type    New Prescriptions New Prescriptions   No medications on file     Jaece Ducharme, Wenda Overland, MD 07/24/16 1428

## 2016-07-24 NOTE — ED Triage Notes (Signed)
Per EMS pt complaint of ongoing dizziness and n/v/d for a week with use of new chemo drug; evaluated for same. Syncopal event today.

## 2016-07-25 DIAGNOSIS — D6481 Anemia due to antineoplastic chemotherapy: Secondary | ICD-10-CM | POA: Diagnosis not present

## 2016-07-25 DIAGNOSIS — T451X5A Adverse effect of antineoplastic and immunosuppressive drugs, initial encounter: Secondary | ICD-10-CM | POA: Diagnosis not present

## 2016-07-25 LAB — BASIC METABOLIC PANEL
ANION GAP: 9 (ref 5–15)
BUN: 34 mg/dL — ABNORMAL HIGH (ref 6–20)
CHLORIDE: 106 mmol/L (ref 101–111)
CO2: 22 mmol/L (ref 22–32)
Calcium: 8.6 mg/dL — ABNORMAL LOW (ref 8.9–10.3)
Creatinine, Ser: 1.72 mg/dL — ABNORMAL HIGH (ref 0.44–1.00)
GFR calc Af Amer: 37 mL/min — ABNORMAL LOW (ref >60)
GFR calc non Af Amer: 32 mL/min — ABNORMAL LOW (ref >60)
GLUCOSE: 108 mg/dL — AB (ref 65–99)
POTASSIUM: 4.5 mmol/L (ref 3.5–5.1)
Sodium: 137 mmol/L (ref 135–145)

## 2016-07-25 LAB — CBC
HEMATOCRIT: 22.6 % — AB (ref 36.0–46.0)
Hemoglobin: 8 g/dL — ABNORMAL LOW (ref 12.0–15.0)
MCH: 30.1 pg (ref 26.0–34.0)
MCHC: 35.4 g/dL (ref 30.0–36.0)
MCV: 85 fL (ref 78.0–100.0)
Platelets: 77 10*3/uL — ABNORMAL LOW (ref 150–400)
RBC: 2.66 MIL/uL — AB (ref 3.87–5.11)
RDW: 16.3 % — ABNORMAL HIGH (ref 11.5–15.5)
WBC: 6.3 10*3/uL (ref 4.0–10.5)

## 2016-07-25 LAB — PREPARE RBC (CROSSMATCH)

## 2016-07-25 MED ORDER — HEPARIN SOD (PORK) LOCK FLUSH 100 UNIT/ML IV SOLN
500.0000 [IU] | INTRAVENOUS | Status: AC | PRN
  Administered 2016-07-25: 500 [IU]

## 2016-07-25 MED ORDER — SODIUM CHLORIDE 0.9 % IV BOLUS (SEPSIS)
1000.0000 mL | Freq: Once | INTRAVENOUS | Status: AC
  Administered 2016-07-25: 1000 mL via INTRAVENOUS

## 2016-07-25 MED ORDER — BOOST PLUS PO LIQD: 237.0000 mL | Freq: Three times a day (TID) | ORAL | 0 refills | Status: AC

## 2016-07-25 MED ORDER — BOOST PLUS PO LIQD
237.0000 mL | Freq: Three times a day (TID) | ORAL | Status: DC
  Administered 2016-07-25: 237 mL via ORAL
  Filled 2016-07-25 (×2): qty 237

## 2016-07-25 MED ORDER — POLYETHYLENE GLYCOL 3350 17 G PO PACK
17.0000 g | PACK | Freq: Every day | ORAL | Status: DC
  Administered 2016-07-25: 17 g via ORAL
  Filled 2016-07-25: qty 1

## 2016-07-25 MED ORDER — SODIUM CHLORIDE 0.9% FLUSH
10.0000 mL | Freq: Two times a day (BID) | INTRAVENOUS | Status: DC
  Administered 2016-07-25: 10 mL

## 2016-07-25 MED ORDER — SODIUM CHLORIDE 0.9 % IV SOLN
Freq: Once | INTRAVENOUS | Status: AC
  Administered 2016-07-25: 13:00:00 via INTRAVENOUS

## 2016-07-25 MED ORDER — PROPRANOLOL HCL 10 MG PO TABS: 10.0000 mg | ORAL_TABLET | Freq: Two times a day (BID) | ORAL | 0 refills | Status: DC

## 2016-07-25 NOTE — Progress Notes (Signed)
Initial Nutrition Assessment  DOCUMENTATION CODES:   Obesity unspecified  INTERVENTION:   Boost Plus TID- Each supplement provides 360kcal and 14g protein.   NUTRITION DIAGNOSIS:   Increased nutrient needs related to cancer and cancer related treatments as evidenced by increased estimated needs from protein.  GOAL:   Patient will meet greater than or equal to 90% of their needs  MONITOR:   PO intake, Supplement acceptance, Labs, Weight trends  REASON FOR ASSESSMENT:   Consult Assessment of nutrition requirement/status  ASSESSMENT:   56 y.o. female with medical history significant of  Ovarian Cancer on gemcitabine, chemo-induced anemia, and CKD; who presents with complaints of passing out. Patient was just last admitted into the hospital from 6/24-6/25 for syncope found to have chemotherapy-induced anemia for which she was transfused 2 units of blood and discharged home.  Since being home patient noted that she has not felt well and is dizzy whenever she stands or changes positions. She passed out and hit her left elbow and shoulder for which she has bruises. Denies any significant trauma to her head. She continues to have nausea and vomiting and is unable to keep any significant amount of food or liquids down. Associated symptoms include shortness of breath , constipation, and back pain. She denies any blood in stool/urine, leg swelling, chest pain, fever, or dysuria. Patient notes that this will be her third transfusion in the last 2 weeks. Patient is followed by Dr. Alvy Bimler of oncology. She was previously supposed get an infusion of chemotherapy on June 26, but due to her low blood counts this was canceled. Her next scheduled chemotherapy therapy infusion is set for July 10.   Met with pt in room today. Pt reports poor appetite and oral intake for 2-3 months pta. Pt reports nausea for the past month and vomiting that started two weeks ago. Pt also reports intermittent diarrhea and  taste changes. Per chart, pt has lost 16lbs(9%) in 3 months but has been wt stable for the past month. Pt ate some fruit and a yogurt for breakfast today. Pt drinks Boost at home. RD discussed with pt the importance of adequate protein intake. RD recommended daily MVI to help with taste changes. Pt reports that vitamins make her nauseas; recommended trying gummies. Pt to possibly discharge today after transfusion.   Medications reviewed and include: protonix, miralax   Labs reviewed: BUN 34(H), creat 1.72(H), Ca 8.6(L) Hgb 8.0(L), Hct 22.6(L)  Nutrition-Focused physical exam completed. Findings are no fat depletion, no muscle depletion, and no edema.   Diet Order:  Diet regular Room service appropriate? Yes; Fluid consistency: Thin  Skin:  Reviewed, no issues  Last BM:  6/26  Height:   Ht Readings from Last 1 Encounters:  07/24/16 _0  (1.651 m)    Weight:   Wt Readings from Last 1 Encounters:  07/24/16 163 lb 9.3 oz (74.2 kg)    Ideal Body Weight:  56.8 kg  BMI:  Body mass index is 27.22 kg/m.  Estimated Nutritional Needs:   Kcal:  1700-2000kcal/day   Protein:  74-89g/day  Fluid:  >1.7L/day   EDUCATION NEEDS:   Education needs addressed  Koleen Distance MS, RD, LDN Pager #[phone] After Hours Pager: [phone]

## 2016-07-25 NOTE — Evaluation (Signed)
Physical Therapy Evaluation Patient Details Name: Audrey Porter MRN: [id number] DOB: October 16, 1960 Today's Date: 07/25/2016   History of Present Illness  Pt admitted with with symptomatic anemia and now s/p blood transfusion x 3 units.  Pt with hx of Ovarian CA and currently on chemo  Clinical Impression  Pt admitted as above and presenting with generalized weakness and mild balance deficits limiting functional mobility.  Pt plans dc home with family assist.  Orthostatic BPs taken and supplied to RN to enter.    Follow Up Recommendations No PT follow up    Equipment Recommendations  None recommended by PT    Recommendations for Other Services       Precautions / Restrictions Precautions Precautions: Fall Restrictions Weight Bearing Restrictions: No      Mobility  Bed Mobility Overal bed mobility: Modified Independent             General bed mobility comments: Increased time and use of bed rail  Transfers Overall transfer level: Needs assistance Equipment used: Rolling walker (2 wheeled) Transfers: Sit to/from Stand Sit to Stand: Modified independent (Device/Increase time)         General transfer comment: Pt unassisted to/from standing  Ambulation/Gait Ambulation/Gait assistance: Min guard;Supervision Ambulation Distance (Feet): 200 Feet Assistive device: None Gait Pattern/deviations: Step-through pattern;Decreased step length - right;Decreased step length - left;Shuffle Gait velocity: mod pace Gait velocity interpretation: Below normal speed for age/gender General Gait Details: mild instability but no LOB "I think its just from not being on my feet for a while"  Stairs            Wheelchair Mobility    Modified Rankin (Stroke Patients Only)       Balance Overall balance assessment: Needs assistance Sitting-balance support: No upper extremity supported;Feet supported Sitting balance-Leahy Scale: Normal       Standing balance-Leahy Scale:  Good                               Pertinent Vitals/Pain Pain Assessment: No/denies pain    Home Living Family/patient expects to be discharged to:: Private residence Living Arrangements: Spouse/significant other Available Help at Discharge: Family Type of Home: House Home Access: Stairs to enter   Technical brewer of Steps: 3 Home Layout: Able to live on main level with bedroom/bathroom Home Equipment: Environmental consultant - 2 wheels      Prior Function Level of Independence: Independent         Comments: I was driving myself to the store     Hand Dominance        Extremity/Trunk Assessment   Upper Extremity Assessment Upper Extremity Assessment: Generalized weakness    Lower Extremity Assessment Lower Extremity Assessment: Generalized weakness    Cervical / Trunk Assessment Cervical / Trunk Assessment: Normal  Communication   Communication: No difficulties  Cognition Arousal/Alertness: Awake/alert Behavior During Therapy: WFL for tasks assessed/performed Overall Cognitive Status: Within Functional Limits for tasks assessed                                        General Comments      Exercises General Exercises - Lower Extremity Ankle Circles/Pumps: AROM;Both;20 reps;Supine   Assessment/Plan    PT Assessment Patient needs continued PT services  PT Problem List Decreased strength;Decreased activity tolerance;Decreased balance;Decreased mobility       PT  Treatment Interventions DME instruction;Gait training;Stair training;Functional mobility training;Therapeutic activities;Patient/family education    PT Goals (Current goals can be found in the Care Plan section)  Acute Rehab PT Goals Patient Stated Goal: Home and not have to come back again so soon PT Goal Formulation: With patient Time For Goal Achievement: 08/01/16 Potential to Achieve Goals: Good    Frequency Min 3X/week   Barriers to discharge         Co-evaluation               AM-PAC PT "6 Clicks" Daily Activity  Outcome Measure Difficulty turning over in bed (including adjusting bedclothes, sheets and blankets)?: A Little Difficulty moving from lying on back to sitting on the side of the bed? : A Little Difficulty sitting down on and standing up from a chair with arms (e.g., wheelchair, bedside commode, etc,.)?: A Little Help needed moving to and from a bed to chair (including a wheelchair)?: A Little Help needed walking in hospital room?: A Little Help needed climbing 3-5 steps with a railing? : A Little 6 Click Score: 18    End of Session   Activity Tolerance: Patient tolerated treatment well Patient left: in chair;with call bell/phone within reach;with family/visitor present Nurse Communication: Mobility status PT Visit Diagnosis: Unsteadiness on feet (R26.81)    Time: 2841-3244 PT Time Calculation (min) (ACUTE ONLY): 22 min   Charges:   PT Evaluation $PT Eval Low Complexity: 1 Procedure     PT G Codes:   PT G-Codes **NOT FOR INPATIENT CLASS** Functional Assessment Tool Used: Clinical judgement Functional Limitation: Mobility: Walking and moving around Mobility: Walking and Moving Around Current Status (W1027): At least 1 percent but less than 20 percent impaired, limited or restricted Mobility: Walking and Moving Around Goal Status [phone]): 0 percent impaired, limited or restricted    Pg [phone]   Liel Rudden 07/25/2016, 2:17 PM

## 2016-07-25 NOTE — Care Management Note (Signed)
Case Management Note  Patient Details  Name: Audrey Porter MRN: [id number] Date of Birth: 1960-05-21  Subjective/Objective: 56 y/o f admitted w/Anemia. From home. Offered choice for HHC-chose [facility]-rep Butch Penny aware of d/c & HHRN order, has accepted. No further CM needs.                   Action/Plan:d/c home w/HHC.   Expected Discharge Date:  07/25/16               Expected Discharge Plan:  [place]  In-House Referral:     Discharge planning Services  CM Consult  Post Acute Care Choice:    Choice offered to:  Patient  DME Arranged:    DME Agency:     HH Arranged:  RN [place] Agency:  [facility] (formerly [employer])  Status of Service:  Completed, signed off  If discussed at [employer], dates discussed:    Additional Comments:  Dessa Phi, RN 07/25/2016, 4:07 PM

## 2016-07-25 NOTE — Discharge Summary (Addendum)
Discharge Summary  Audrey Porter:[id number] DOB: [birth year]  PCP: Caryl Bis, MD  Admit date: 07/24/2016 Discharge date: 07/25/2016  Time spent: >80mins, more than 50% time spent on coordination of care.  Recommendations for Outpatient Follow-up:  1. F/u with PMD within a week  for hospital discharge follow up, repeat cbc/bmp at follow up 2. Follow up with oncology  Discharge Diagnoses:  Active Hospital Problems   Diagnosis Date Noted  . Antineoplastic chemotherapy induced anemia 07/24/2016  . Acute kidney injury superimposed on chronic kidney disease ([place]) 07/24/2016  . Venous thromboembolism 07/24/2016  . History of venous thromboembolism 07/24/2016  . Thrombocytopenia ([place]) 07/20/2016  . Syncope, vasovagal 07/20/2016  . Ovarian cancer ([place]) 12/02/2015    Resolved Hospital Problems   Diagnosis Date Noted Date Resolved  No resolved problems to display.    Discharge Condition: stable  Diet recommendation: regular diet  Filed Weights   07/24/16 1212 07/24/16 1700  Weight: 69.9 kg (154 lb) 74.2 kg (163 lb 9.3 oz)    History of present illness:  PCP: Caryl Bis, MD  Patient coming from: Home via EMS  Chief Complaint: Syncope  HPI: Audrey Porter is a 56 y.o. female with medical history significant of  Ovarian Cancer on gemcitabine, chemo-induced anemia, and CKD; who presents with complaints of passing out. Patient was just last admitted into the hospital from 6/24-6/25 for syncope found to have chemotherapy-induced anemia for which she was transfused 2 units of blood and discharged home.  Since being home patient noted that she has not felt well and is dizzy whenever she stands or changes positions. She passed out and hit her left elbow and shoulder for which she has bruises. Denies any significant trauma to her head. She continues to have nausea and vomiting and is unable to keep any significant amount of food or liquids down. Associated symptoms include  shortness of breath , constipation, and back pain. She denies any blood in stool/urine, leg swelling, chest pain, fever, or dysuria. Patient notes that this will be her third transfusion in the last 2 weeks. Patient is followed by Dr. Alvy Bimler of oncology. She was previously supposed get an infusion of chemotherapy on June 26, but due to her low blood counts this was canceled. Her next scheduled chemotherapy therapy infusion is set for July 10.   ED Course:  Upon admission into the emergency department patient was seen to be afebrile, heart rates 109-121, respirations up to 24, and other vital signs within normal limits. Labs revealed hemoglobin of 5.5, platelets 69, BUN 35, creatinine 2.05. Patient wasgiven 1 liter of NS IV fluids, and ordered to be transfused 2 units of PRBCs. Review of records shows that patient's last MRI of the brain was negative for any acute abnormalities.  Hospital Course:  Principal Problem:   Antineoplastic chemotherapy induced anemia Active Problems:   Ovarian cancer ([place])   Syncope, vasovagal   Thrombocytopenia ([facility])   Acute kidney injury superimposed on chronic kidney disease ([facility])   Venous thromboembolism   History of venous thromboembolism   Chemotherapy-induced Anemia: Recurrent. Patient just recently admitted to the hospital on 6/24-6/25 for the same. -s/p transfusion of 3 units of PRBCs with appropriate increase of hgb - patient is to follow up Dr. Alvy Bimler   Thrombocytopenia: Platelet count 69 which is improved from previous lately count 32.  - likely chemo related -stable, no bleed -continue follow with hematology /oncology  Acute kidney injury on chronic kidney disease stage III:  -  Baseline creatinine1- 1.5, but patient presents with a creatinine 2.05, and BUN 35. Suspect this is secondary to dehydration and hypovolemia. - ua no infection, cr improved with Gentle IV fluids and blood transfusion   recurrent  syncope  - telemetry  unremarkable -likely from dehydration and anemia, MRI "No intracranial or calvarial metastatic disease.", she was hospitalized for the same before , has echo which is unremarkable, syncope thought from dehydartion -encourage oral intake -orthostatic vital signs with tachycardia upon standing, but bp remain stable, postural tachycardia syndrome also on differential , start trial of low dose propranalol 10mg  bid, patient is advised to check blood pressure at home and close follow up with pmd.  Nausea and vomiting -likely chemo related - Antiemetics as needed -tolerate diet in the hospital  Ovarian cancer:  On chemo with gemzar  - Continue follow-up with Dr.Gorsuch    H/o recurrent VTE: Based off recent admission stool guaiacs were negative at that time, and patient denies any bowel movement. - Continue Xarelto   FTT: progressive weakness, PT eval  DVT prophylaxis: Xarelto   Code Status: Full  Family Communication: pateint and husband at bedside Disposition Plan:  discharge home with home health Consults called: Dr Alvy Bimler    Discharge Exam: BP 119/87   Pulse 99   Temp 97.5 F (36.4 C) (Oral)   Resp 20   Ht 5\' 5"  (1.651 m)   Wt 74.2 kg (163 lb 9.3 oz)   SpO2 100%   BMI 27.22 kg/m   General: NAD, chronically ill appearing,  Cardiovascular: RRR Respiratory: CTABL  Discharge Instructions You were cared for by a hospitalist during your hospital stay. If you have any questions about your discharge medications or the care you received while you were in the hospital after you are discharged, you can call the unit and asked to speak with the hospitalist on call if the hospitalist that took care of you is not available. Once you are discharged, your primary care physician will handle any further medical issues. Please note that NO REFILLS for any discharge medications will be authorized once you are discharged, as it is imperative that you return to your primary care physician  (or establish a relationship with a primary care physician if you do not have one) for your aftercare needs so that they can reassess your need for medications and monitor your lab values.  Discharge Instructions    Diet general    Complete by:  As directed    Face-to-face encounter (required for Medicare/Medicaid patients)    Complete by:  As directed    I Press [place] certify that this patient is under my care and that I, or a nurse practitioner or physician's assistant working with me, had a face-to-face encounter that meets the physician face-to-face encounter requirements with this patient on 07/25/2016. The encounter with the patient was in whole, or in part for the following medical condition(s) which is the primary reason for home health care (List medical condition): FTT, progressive weakness   The encounter with the patient was in whole, or in part, for the following medical condition, which is the primary reason for home health care:  FTT   I certify that, based on my findings, the following services are medically necessary home health services:   Nursing Physical therapy     Reason for Medically Necessary Home Health Services:  Skilled Nursing- Change/Decline in Patient Status   My clinical findings support the need for the above services:  Shortness of breath  with activity   Further, I certify that my clinical findings support that this patient is homebound due to:  Immunocompromised   Home Health    Complete by:  As directed    To provide the following care/treatments:  RN   Increase activity slowly    Complete by:  As directed      Allergies as of 07/25/2016      Reactions   Sulfa Antibiotics Hives      Medication List    TAKE these medications   acetaminophen 500 MG tablet Commonly known as:  TYLENOL Take 1,000 mg by mouth every 6 (six) hours as needed for mild pain, moderate pain, fever or headache.   cetirizine 10 MG tablet Commonly known as:  ZYRTEC Take 10 mg by mouth  at bedtime.   fluticasone 50 MCG/ACT nasal spray Commonly known as:  FLONASE Place 1 spray into both nostrils daily as needed. What changed:  reasons to take this   GEMZAR IV Inject into the vein every 28 (twenty-eight) days. Gemzar 1,482mg  in 0.9% sodium chloride 234mL chemo infusion.   To be given at 523mL/hr over 30 minutes.   HYDROmorphone 2 MG tablet Commonly known as:  DILAUDID Take 1 tablet (2 mg total) by mouth every 4 (four) hours as needed for severe pain.   lactose free nutrition Liqd Take 237 mLs by mouth 3 (three) times daily with meals.   lidocaine-prilocaine cream Commonly known as:  EMLA Apply 1-2 hours prior to [facility] access as directed. What changed:  how much to take  how to take this  when to take this  reasons to take this  additional instructions   LORazepam 0.5 MG tablet Commonly known as:  ATIVAN Take 1 tablet (0.5 mg total) by mouth every 6 (six) hours as needed for anxiety.   magnesium hydroxide 400 MG/5ML suspension Commonly known as:  MILK OF MAGNESIA Take 15 mLs by mouth daily as needed for mild constipation.   mirtazapine 30 MG tablet Commonly known as:  REMERON Take 1 tablet (30 mg total) by mouth at bedtime.   omeprazole 20 MG capsule Commonly known as:  PRILOSEC Take 20-40 mg by mouth at bedtime.   ondansetron 8 MG tablet Commonly known as:  ZOFRAN Take 1 tablet (8 mg total) by mouth every 8 (eight) hours as needed for nausea (Will not make drowsy).   polyethylene glycol packet Commonly known as:  MIRALAX / GLYCOLAX Take 17 g by mouth daily as needed for mild constipation.   prochlorperazine 10 MG tablet Commonly known as:  COMPAZINE Take 1 tablet (10 mg total) by mouth every 6 (six) hours as needed. What changed:  reasons to take this   propranolol 10 MG tablet Commonly known as:  INDERAL Take 1 tablet (10 mg total) by mouth 2 (two) times daily.   rivaroxaban 20 MG Tabs tablet Commonly known as:  XARELTO Take 1  tablet (20 mg total) by mouth daily with supper.      Allergies  Allergen Reactions  . Sulfa Antibiotics Hives   Follow-up Information    Caryl Bis, MD Follow up in 1 week(s).   Specialty:  Family Medicine Why:  hospital discharge follow up please check your blood pressure at home, bring your blood pressure recording for your doctor to review. Contact information: [place]  [address] [phone]        follow up with oncology on 7/10 Follow up.  The results of significant diagnostics from this hospitalization (including imaging, microbiology, ancillary and laboratory) are listed below for reference.    Significant Diagnostic Studies: Dg Scapula Left  Result Date: 07/21/2016 CLINICAL DATA:  Pain after falling onto shoulder at noon yesterday. EXAM: LEFT SCAPULA - 2+ VIEWS COMPARISON:  None. FINDINGS: There is no evidence of fracture or other focal bone lesions. Soft tissues are unremarkable. IMPRESSION: Negative. Electronically Signed   By: Andreas Newport M.D.   On: 07/21/2016 00:26   Mr Jeri Cos HF Contrast  Result Date: 07/21/2016 CLINICAL DATA:  History of ovarian cancer.  Nausea EXAM: MRI HEAD WITHOUT AND WITH CONTRAST TECHNIQUE: Multiplanar, multiecho pulse sequences of the brain and surrounding structures were obtained without and with intravenous contrast. CONTRAST:  44mL MULTIHANCE GADOBENATE DIMEGLUMINE 529 MG/ML IV SOLN COMPARISON:  Head CT 02/17/2016 FINDINGS: Brain: The midline structures are normal. There is no focal diffusion restriction to indicate acute infarct. There is multifocal hyperintense T2-weighted signal within the periventricular and subcortical white matter, which may be seen in the setting of migraine headaches or early chronic microvascular disease; however, it is also seen in normal patients of this age. No intraparenchymal hematoma or chronic microhemorrhage. Brain volume is normal for age without age-advanced or lobar  predominant atrophy. The dura is normal and there is no extra-axial collection. Vascular: Major intracranial arterial and venous sinus flow voids are preserved. Skull and upper cervical spine: The visualized skull base, calvarium, upper cervical spine and extracranial soft tissues are normal. Sinuses/Orbits: No fluid levels or advanced mucosal thickening. No mastoid effusion. Normal orbits. IMPRESSION: No intracranial or calvarial metastatic disease. Electronically Signed   By: Ulyses Jarred M.D.   On: 07/21/2016 16:30    Microbiology: No results found for this or any previous visit (from the past 240 hour(s)).   Labs: Basic Metabolic Panel:  Recent Labs Lab 07/20/16 1635 07/24/16 1227 07/25/16 0648  NA 135 135 137  K 4.3 4.9 4.5  CL 101 103 106  CO2 22 22 22   GLUCOSE 145* 149* 108*  BUN 19 35* 34*  CREATININE 1.35* 2.06* 1.72*  CALCIUM 9.3 8.9 8.6*   Liver Function Tests:  Recent Labs Lab 07/20/16 1635  AST 23  ALT 12*  ALKPHOS 78  BILITOT 0.6  PROT 7.6  ALBUMIN 3.3*   No results for input(s): LIPASE, AMYLASE in the last 168 hours. No results for input(s): AMMONIA in the last 168 hours. CBC:  Recent Labs Lab 07/20/16 1938 07/21/16 0103 07/21/16 0652 07/24/16 1227 07/24/16 1424 07/25/16 0648  WBC 4.8 4.5 3.7* 9.5  --  6.3  NEUTROABS  --   --   --  7.1  --   --   HGB 6.1* 7.0* 8.6* 5.5*  --  8.0*  HCT 18.4* 21.0* 25.8* 16.1*  --  22.6*  MCV 88.5 86.4 86.9 87.5  --  85.0  PLT 44* 34* 32* 88* 69* 77*   Cardiac Enzymes:  Recent Labs Lab 07/21/16 0100  TROPONINI <0.03   BNP: BNP (last 3 results)  Recent Labs  02/17/16 1435  BNP 168.3*    ProBNP (last 3 results) No results for input(s): PROBNP in the last 8760 hours.  CBG:  Recent Labs Lab 07/24/16 1229  GLUCAP 127*       Signed:  Luanna Weesner MD, PhD  [facility] 07/25/2016, 2:47 PM

## 2016-07-26 LAB — TYPE AND SCREEN
ABO/RH(D): B POS
ANTIBODY SCREEN: NEGATIVE
UNIT DIVISION: 0
Unit division: 0
Unit division: 0

## 2016-07-26 LAB — BPAM RBC
Blood Product Expiration Date: 201807152359
Blood Product Expiration Date: 201807172359
Blood Product Expiration Date: 201807222359
ISSUE DATE / TIME: 201806282102
ISSUE DATE / TIME: 201806291035
ISSUE DATE / TIME: 201806281745
UNIT TYPE AND RH: 7300
Unit Type and Rh: 7300
Unit Type and Rh: 7300

## 2016-08-05 ENCOUNTER — Ambulatory Visit (HOSPITAL_BASED_OUTPATIENT_CLINIC_OR_DEPARTMENT_OTHER): Payer: Managed Care, Other (non HMO) | Admitting: Hematology and Oncology

## 2016-08-05 ENCOUNTER — Other Ambulatory Visit (HOSPITAL_BASED_OUTPATIENT_CLINIC_OR_DEPARTMENT_OTHER): Payer: Managed Care, Other (non HMO)

## 2016-08-05 ENCOUNTER — Encounter: Payer: Self-pay | Admitting: Hematology and Oncology

## 2016-08-05 ENCOUNTER — Ambulatory Visit: Payer: Managed Care, Other (non HMO)

## 2016-08-05 ENCOUNTER — Ambulatory Visit (HOSPITAL_BASED_OUTPATIENT_CLINIC_OR_DEPARTMENT_OTHER): Payer: Managed Care, Other (non HMO)

## 2016-08-05 VITALS — BP 109/79 | HR 63 | Temp 98.1°F | Resp 17 | Ht 65.0 in | Wt 161.4 lb

## 2016-08-05 DIAGNOSIS — C561 Malignant neoplasm of right ovary: Secondary | ICD-10-CM

## 2016-08-05 DIAGNOSIS — C569 Malignant neoplasm of unspecified ovary: Secondary | ICD-10-CM

## 2016-08-05 DIAGNOSIS — T451X5A Adverse effect of antineoplastic and immunosuppressive drugs, initial encounter: Secondary | ICD-10-CM

## 2016-08-05 DIAGNOSIS — I2602 Saddle embolus of pulmonary artery with acute cor pulmonale: Secondary | ICD-10-CM

## 2016-08-05 DIAGNOSIS — C562 Malignant neoplasm of left ovary: Secondary | ICD-10-CM

## 2016-08-05 DIAGNOSIS — C563 Malignant neoplasm of bilateral ovaries: Secondary | ICD-10-CM

## 2016-08-05 DIAGNOSIS — D6481 Anemia due to antineoplastic chemotherapy: Secondary | ICD-10-CM | POA: Diagnosis not present

## 2016-08-05 DIAGNOSIS — Z95828 Presence of other vascular implants and grafts: Secondary | ICD-10-CM

## 2016-08-05 DIAGNOSIS — N183 Chronic kidney disease, stage 3 unspecified: Secondary | ICD-10-CM

## 2016-08-05 DIAGNOSIS — E440 Moderate protein-calorie malnutrition: Secondary | ICD-10-CM

## 2016-08-05 DIAGNOSIS — G893 Neoplasm related pain (acute) (chronic): Secondary | ICD-10-CM

## 2016-08-05 DIAGNOSIS — Z5111 Encounter for antineoplastic chemotherapy: Secondary | ICD-10-CM | POA: Diagnosis not present

## 2016-08-05 LAB — COMPREHENSIVE METABOLIC PANEL
ALBUMIN: 2.5 g/dL — AB (ref 3.5–5.0)
ALK PHOS: 115 U/L (ref 40–150)
ALT: <6 U/L (ref 0–55)
ANION GAP: 12 meq/L — AB (ref 3–11)
AST: 12 U/L (ref 5–34)
BILIRUBIN TOTAL: 0.54 mg/dL (ref 0.20–1.20)
BUN: 34.3 mg/dL — ABNORMAL HIGH (ref 7.0–26.0)
CO2: 25 mEq/L (ref 22–29)
Calcium: 10.4 mg/dL (ref 8.4–10.4)
Chloride: 104 mEq/L (ref 98–109)
Creatinine: 1.6 mg/dL — ABNORMAL HIGH (ref 0.6–1.1)
EGFR: 42 mL/min/{1.73_m2} — AB (ref >90)
Glucose: 119 mg/dl (ref 70–140)
Potassium: 4.4 mEq/L (ref 3.5–5.1)
Sodium: 142 mEq/L (ref 136–145)
TOTAL PROTEIN: 8.1 g/dL (ref 6.4–8.3)

## 2016-08-05 LAB — CBC WITH DIFFERENTIAL/PLATELET
BASO%: 0.3 % (ref 0.0–2.0)
Basophils Absolute: 0 10*3/uL (ref 0.0–0.1)
EOS%: 1.4 % (ref 0.0–7.0)
Eosinophils Absolute: 0.1 10*3/uL (ref 0.0–0.5)
HEMATOCRIT: 28.5 % — AB (ref 34.8–46.6)
HEMOGLOBIN: 8.9 g/dL — AB (ref 11.6–15.9)
LYMPH#: 1.5 10*3/uL (ref 0.9–3.3)
LYMPH%: 19 % (ref 14.0–49.7)
MCH: 29.5 pg (ref 25.1–34.0)
MCHC: 31.2 g/dL — ABNORMAL LOW (ref 31.5–36.0)
MCV: 94.4 fL (ref 79.5–101.0)
MONO#: 0.8 10*3/uL (ref 0.1–0.9)
MONO%: 10.7 % (ref 0.0–14.0)
NEUT#: 5.3 10*3/uL (ref 1.5–6.5)
NEUT%: 68.6 % (ref 38.4–76.8)
Platelets: 366 10*3/uL (ref 145–400)
RBC: 3.02 10*6/uL — ABNORMAL LOW (ref 3.70–5.45)
RDW: 16.3 % — AB (ref 11.2–14.5)
WBC: 7.8 10*3/uL (ref 3.9–10.3)

## 2016-08-05 MED ORDER — PROCHLORPERAZINE MALEATE 10 MG PO TABS
ORAL_TABLET | ORAL | Status: AC
  Filled 2016-08-05: qty 1

## 2016-08-05 MED ORDER — HEPARIN SOD (PORK) LOCK FLUSH 100 UNIT/ML IV SOLN
500.0000 [IU] | Freq: Once | INTRAVENOUS | Status: AC | PRN
  Administered 2016-08-05: 500 [IU]
  Filled 2016-08-05: qty 5

## 2016-08-05 MED ORDER — SODIUM CHLORIDE 0.9% FLUSH
10.0000 mL | Freq: Once | INTRAVENOUS | Status: AC
  Administered 2016-08-05: 10 mL
  Filled 2016-08-05: qty 10

## 2016-08-05 MED ORDER — SODIUM CHLORIDE 0.9 % IV SOLN
Freq: Once | INTRAVENOUS | Status: AC
  Administered 2016-08-05: 12:00:00 via INTRAVENOUS

## 2016-08-05 MED ORDER — PREDNISONE 10 MG PO TABS: 10.0000 mg | ORAL_TABLET | Freq: Every day | ORAL | 1 refills | Status: AC

## 2016-08-05 MED ORDER — HYDROMORPHONE HCL 4 MG PO TABS: 4.0000 mg | ORAL_TABLET | ORAL | 0 refills | Status: AC | PRN

## 2016-08-05 MED ORDER — SODIUM CHLORIDE 0.9 % IV SOLN
800.0000 mg/m2 | Freq: Once | INTRAVENOUS | Status: AC
  Administered 2016-08-05: 1482 mg via INTRAVENOUS
  Filled 2016-08-05: qty 38.98

## 2016-08-05 MED ORDER — SODIUM CHLORIDE 0.9% FLUSH
10.0000 mL | INTRAVENOUS | Status: DC | PRN
  Administered 2016-08-05: 10 mL
  Filled 2016-08-05: qty 10

## 2016-08-05 MED ORDER — PROCHLORPERAZINE MALEATE 10 MG PO TABS
10.0000 mg | ORAL_TABLET | Freq: Once | ORAL | Status: AC
  Administered 2016-08-05: 10 mg via ORAL

## 2016-08-05 NOTE — Patient Instructions (Signed)
[facility] Discharge Instructions for Patients Receiving Chemotherapy  Today you received the following chemotherapy agents Gemzar.  To help prevent nausea and vomiting after your treatment, we encourage you to take your nausea medication.   If you develop nausea and vomiting that is not controlled by your nausea medication, call the clinic.   BELOW ARE SYMPTOMS THAT SHOULD BE REPORTED IMMEDIATELY:  *FEVER GREATER THAN 100.5 F  *CHILLS WITH OR WITHOUT FEVER  NAUSEA AND VOMITING THAT IS NOT CONTROLLED WITH YOUR NAUSEA MEDICATION  *UNUSUAL SHORTNESS OF BREATH  *UNUSUAL BRUISING OR BLEEDING  TENDERNESS IN MOUTH AND THROAT WITH OR WITHOUT PRESENCE OF ULCERS  *URINARY PROBLEMS  *BOWEL PROBLEMS  UNUSUAL RASH Items with * indicate a potential emergency and should be followed up as soon as possible.  Feel free to call the clinic you have any questions or concerns. The clinic phone number is [phone].  Please show the CHEMO ALERT CARD at check-in to the Emergency Department and triage nurse.   

## 2016-08-05 NOTE — Progress Notes (Signed)
Okay to treat with creatinine of 1.6 per Dr. Alvy Bimler.

## 2016-08-06 ENCOUNTER — Encounter: Payer: Self-pay | Admitting: Hematology and Oncology

## 2016-08-06 NOTE — Progress Notes (Signed)
Pateint called and left voicemail regarding PA for Dilaudid. Patient states pharmacy had her to call to request PA.  Advised patient the pharmacy will fax [place] a PA request with all of the needed information and then the PA request will be initiated.  Patient states she will call the pharmacy and have them fax the PA request.

## 2016-08-07 DIAGNOSIS — G893 Neoplasm related pain (acute) (chronic): Secondary | ICD-10-CM | POA: Insufficient documentation

## 2016-08-07 NOTE — Assessment & Plan Note (Signed)
The causes of anemia is multifactorial but most likely due to cancer chemotherapy side effects I recommend close monitoring of her blood work She would receive 2 units of blood transfusion whenever her hemoglobin is less than 8

## 2016-08-07 NOTE — Assessment & Plan Note (Signed)
She has worsening abdominal pain due to cancer I plan to increase Dilaudid and reassess pain control next week

## 2016-08-07 NOTE — Progress Notes (Signed)
[facility] OFFICE PROGRESS NOTE  Patient Care Team: Caryl Bis, MD as PCP - General (Family Medicine)  SUMMARY OF ONCOLOGIC HISTORY:   Ovarian cancer, bilateral ([place])   03/16/2014 Imaging    Outside hospital CT scan of the abdomen and pelvis reviewed the pelvic mass measured 9.9 cm 20.1 cm 17.8 cm consistent with ovarian cancer. 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 Alycia Rossetti performed EXPLORATORY LAPAROTOMY, EXPLORATORY CELIOTOMY WITH OR WITHOUT BIOPSY(S) URETEROLYSIS, WITH OR WITHOUT REPOSITIONING OF URETER FOR RETROPERITONEAL FIBROSIS BILATERAL S&O W/OMENTECTOMY, TAH/RADICAL DISSECT FOR DEBULK; W/PELVIC LYMPHAD/LMTD PARA-AORTIC LYMPHADENECT         03/31/2014 Pathology Results    Diagnosis:  FSA, A:Ovary and fallopian tube, left, left salpingo-oophorectomy  Histologic type:Clear cell carcinoma (see comment)   Histologic grade:high grade/grade 3   Tumor site: (ovary/primary peritoneal)ovary   Tumor size: (greatest dimension)13 x 9.5 x 6.2 cm   Ovarian surface involvement:not identified   Status of capsule: (intact/ruptured)intact   Extent of involvement of other tissues/organs (select all that apply):  Right ovary  Left ovary  Lymphovascular space invasion:present   Regional lymph nodes (see other specimens): Total number involved:1 Total number examined:10 Size of largest metastasis:microscopic focus only, approximately 1 mm;  necrosis, fibrosis, and foreign body type giant cell reaction present within two additional lymph nodes, changes suggestive of treatment effect or regression   Additional pathologic  findings:none   AJCC Pathologic Stage: pT3a1(i) pN1 pMx FIGO (2014classification) Stage Grouping:IIIA1(i)  Note:This pathologic stage assessment is based on information available at the time of this report, and is subject to change pending clinical review and additional information.  B:Ovary and fallopian tube, right, right salpingo-oophorectomy  Histologic type:clear cell carcinoma (B8)  Histologic grade:high grade/grade 3   Tumor site: (ovary/primary peritoneal)ovary   Tumor size: (greatest dimension)11 x 9 x 4.5 cm   Ovarian surface involvement:not identified   Status of capsule: (intact/ruptured)ruptured   C:Uterus and cervix, hysterectomy  Cervix  - No tumor seen   Endometrium  - Atrophic endometrium   Myometrium  - Adenomyosis and subserosal endometriosis   D:Omentum, omentectomy - No tumor seen   E:Lymph nodes, right periaortic, regional dissection - Three lymph nodes, no tumor seen (0/3)   F:Lymph nodes, right pelvic, regional dissection - 1 out of 6 lymph nodes shows minute (approximately 2 mm) focus of metastatic adenocarcinoma (1/6) - Two additional lymph nodes show necrosis, fibrosis, and foreign body type giant cell reaction suggestive of treatment effect versus regression   G:Lymph node, left pelvic, regional dissection - One lymph node, no tumor seen (0/1)   COMMENT: Multiple immunostains are performed on block A12 to further evaluate the case for metastasis to the bilateral ovaries form a distant site and to evaluate for mucinous versus clear cell change. The tumor shows focal intracytoplasmic mucin on mucin stains, and immunostains shows the tumor to be cytokeratin 7 positive, cytokeratin 20 negative, CDX2 negative, PAX8 positive, and negative for chromogranin, synaptophysin, TTF1, p53, ER, and PR, and rare positive cells on CA125, with low p53 (approximately 5%). Napsin is  positive. RCC and CD10 are negative. PAS is positive and the positive staining does not digest with PASD.  These findings supporting a tumor of primary gynecologic origin. Overall, a clear cell carcinoma is favored. Seromucinous features cannot be excluded based on the presence of scattered, faint intracytoplasmic mucin noted on mucin stain and the morphology with focal signet ring cells. Although uncommon, this has been described, however, in clear cell carcinomas, therefore the above classification is favored       03/31/2014 Tumor Marker    Patient's tumor was tested for the following markers: CA125 Results of the tumor marker test revealed 41.3      05/11/2014 Tumor Marker    Patient's tumor was tested for the following markers: CA125 Results of the tumor marker test revealed 8      05/12/2014 - 09/01/2014 Chemotherapy    She received adjuvant chemotherapy with carbo/taxol for 6 cycles      05/12/2014 Genetic Testing    Genetics testing:OvaNext panel 05-12-14 identified a variant of uncertain significance, RAD51D, p.T103A      06/01/2014 Tumor Marker    Patient's tumor was tested for the following markers: CA125 Results of the tumor marker test revealed 10      06/27/2014 Tumor Marker    Patient's tumor was tested for the following markers: CA125 Results of the tumor marker test revealed 5      08/10/2014 Tumor Marker    .Patient's tumor was tested for the following markers: CA125 Results of the tumor marker test revealed 6      09/14/2014 Imaging    Segment of circumferential bowel wall narrowing at the junction of the sigmoid colon and rectum is concerning for a serosal metastasis with subsequent constriction of the bowel lumen. There is a moderate volume of stool throughout the colon suggesting some degree of early obstruction. There is stool in the rectum. 2. Status post hysterectomy and resection of the cystic ovarian masses. 3. Three nodules adherent to the transverse colon are  concerning for serosal metastasis. 4. Subcutaneous thickening at the umbilicus likely related to surgery.      09/14/2014 Tumor Marker    Patient's tumor was tested for the following markers: CA125 Results of the tumor marker test revealed 5      09/27/2014 PET scan    No abnormal hypermetabolism above normal colon at the rectosigmoid junction, at the site of suspected mass in narrowing on 09/14/2014. 2. Small soft tissue nodules along the distal transverse colon do not show abnormal hypermetabolism but may be too small for PET resolution.      10/05/2014 Procedure    She underwent sigmoid colon biopsy      10/05/2014 Pathology Results    Sigmoid colon biopsy revealed acute colitis, nonspecific      10/18/2014 Tumor Marker    Patient's tumor was tested for the following markers: CA125 Results of the tumor marker test revealed 5      01/08/2015 Tumor Marker    Patient's tumor was tested for the following markers: CA125 Results of the tumor marker test revealed 4      04/04/2015 Tumor Marker    Patient's tumor was tested for the following markers: CA125 Results of the tumor marker test revealed 6.9      07/12/2015 Tumor Marker    Patient's tumor was tested for the following markers: CA125 Results of the tumor marker test revealed 7.5      10/10/2015 Tumor Marker    Patient's tumor was tested for the following markers: CA125 Results of the tumor marker test revealed 11.4      11/05/2015  Tumor Marker    Patient's tumor was tested for the following markers: CA125 Results of the tumor marker test revealed 18.4      11/19/2015 Imaging    1. 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 2. New large subcapsular metastatic masses along LEFT and RIGHT hepatic lobes and positioned between the diaphragm and the RIGHT hepatic lobe. 3. Peritoneal metastasis with new nodules within the mesentery of the small bowel and colon. 4. Potential serosal  implant within the proximal sigmoid colon. No bowel obstruction 5. Small amount of ascites is new from prior. 6. Mild pelvic lymphadenopathy consistent with  metastatic disease      11/30/2015 Tumor Marker    Patient's tumor was tested for the following markers: CA125 Results of the tumor marker test revealed 32.4      12/04/2015 Procedure    Ultrasound and fluoroscopically guided right internal jugular single lumen power port catheter insertion. Tip in the SVC/RA junction. Catheter ready for use      12/06/2015 - 12/28/2015 Chemotherapy    She received 2 cycles of carbo/taxol, DC due to disease progression      12/21/2015 Tumor Marker    Patient's tumor was tested for the following markers: CA125 Results of the tumor marker test revealed 48.4      01/14/2016 Tumor Marker    Patient's tumor was tested for the following markers: CA125 Results of the tumor marker test revealed 74.6      01/16/2016 Imaging    Prominent interval progression of metastatic disease in the abdomen and pelvis. Large capsular lesions along the liver are associated with enlarging and new omental, mesenteric and peritoneal nodules and masses throughout the abdomen and pelvis. 2. Interval progression of intraperitoneal free fluid. 3. Interval development of new small left pleural effusion. 4. Lesion involving the sigmoid colon has progressed in the interval. No evidence for colonic obstruction at this time.      01/16/2016 Imaging    LV EF: 55% -   60%      01/25/2016 - 05/02/2016 Chemotherapy    She received Avastin with Doxil. Avastin was discontinued when she developed massive PE.  Doxil was stopped due to disease progression       02/16/2016 Imaging    Massive bilateral occlusive pulmonary emboli. CT evidence of right heart strain (RV/LV Ratio = 1.46) consistent with at least submassive (intermediate risk) PE. The presence of right heart strain has been associated with an increased risk of morbidity and  mortality. 2. Large layering RIGHT pleural effusion. Concern for malignant effusion. 3. New large mass in the gastrohepatic ligament (6 cm) concerning for progression of ovarian carcinoma.      02/17/2016 - 02/21/2016 Hospital Admission    She was admitted to the hospital and was found to have DVT and severe PE with heart strain. She was discharged home on Lovenox      02/18/2016 Imaging    Normal left ventricular size and systolic function. Severely dilated right ventricle with moderately decreased systolic function and severe pulmonary hypertension suspicious for pulmonary embolism.      02/18/2016 Imaging    Findings consistent with acute deep vein thrombosis involving the popliteal and distal femoral veins of the left lower extremity.       02/25/2016 Tumor Marker    Patient's tumor was tested for the following markers: CA125 Results of the tumor marker test revealed 89.5      03/25/2016 Imaging    No  acute findings within the abdomen or pelvis. 2. Large volume of peritoneal tumor is identified within the abdomen and pelvis. This is stable to slightly increased in size in the interval. 3. Decrease in volume of ascites 4. Left pleural effusion is slightly increased in volume from previous exam.      03/28/2016 Adverse Reaction    She received 2 units of blood transfusion      04/04/2016 Tumor Marker    Patient's tumor was tested for the following markers: CA125 Results of the tumor marker test revealed 71.2      05/02/2016 Tumor Marker    Patient's tumor was tested for the following markers: CA125 Results of the tumor marker test revealed 58.2      05/22/2016 Imaging    Left ventricle: The cavity size was normal. Systolic function was normal. Wall motion was normal; there were no regional wall motion abnormalities. Doppler parameters are consistent with abnormal left ventricular relaxation (grade 1 diastolic dysfunction).      05/28/2016 Imaging    CT: 1. Interval increased burden  of bulky peritoneal carcinomatosis as detailed. Small volume malignant ascites is increased. 2. Stable mild right pelvic adenopathy. 3. Moderate dependent left pleural effusion is increased. 4. Near complete resolution of previously visualized pulmonary emboli as detailed . 5. Stable moderate umbilical hernia containing small bowel loops, with no bowel complication.      05/28/2016 Tumor Marker    Patient's tumor was tested for the following markers: CA125 Results of the tumor marker test revealed 44.5      07/08/2016 -  Chemotherapy    She received Gemzar      07/21/2016 Imaging    MR brain: No intracranial or calvarial metastatic disease.       INTERVAL HISTORY: Please see below for problem oriented charting. She is seen for further follow-up She complain of excessive fatigue, poor appetite, increasing abdominal distention and pain The pain is located in the right lower quadrant Denies nausea She has no recent syncopal episode, but complain of dizziness and shortness of breath on minimal exertion  REVIEW OF SYSTEMS:   Constitutional: Denies fevers, chills  Eyes: Denies blurriness of vision Ears, nose, mouth, throat, and face: Denies mucositis or sore throat Skin: Denies abnormal skin rashes Lymphatics: Denies new lymphadenopathy or easy bruising Neurological:Denies numbness, tingling or new weaknesses Behavioral/Psych: Mood is stable, no new changes  All other systems were reviewed with the patient and are negative.  I have reviewed the past medical history, past surgical history, social history and family history with the patient and they are unchanged from previous note.  ALLERGIES:  is allergic to sulfa antibiotics.  MEDICATIONS:  Current Outpatient Prescriptions  Medication Sig Dispense Refill  . acetaminophen (TYLENOL) 500 MG tablet Take 1,000 mg by mouth every 6 (six) hours as needed for mild pain, moderate pain, fever or headache.     . cetirizine (ZYRTEC) 10 MG  tablet Take 10 mg by mouth at bedtime.     . fluticasone (FLONASE) 50 MCG/ACT nasal spray Place 1 spray into both nostrils daily as needed. (Patient taking differently: Place 1 spray into both nostrils daily as needed for rhinitis. ) 16 g 0  . Gemcitabine HCl (GEMZAR IV) Inject into the vein every 28 (twenty-eight) days. Gemzar 1,447m in 0.9% sodium chloride 2579mchemo infusion.   To be given at [id number] over 30 minutes.     . HMarland KitchenDROmorphone (DILAUDID) 4 MG tablet Take 1 tablet (4 mg total) by mouth every  4 (four) hours as needed for severe pain. 60 tablet 0  . lactose free nutrition (BOOST PLUS) LIQD Take 237 mLs by mouth 3 (three) times daily with meals. 30 Can 0  . lidocaine-prilocaine (EMLA) cream Apply 1-2 hours prior to [facility] access as directed. (Patient taking differently: Apply 1 application topically once as needed. 1-2 hours prior to port access) 30 g 1  . LORazepam (ATIVAN) 0.5 MG tablet Take 1 tablet (0.5 mg total) by mouth every 6 (six) hours as needed for anxiety. 60 tablet 0  . magnesium hydroxide (MILK OF MAGNESIA) 400 MG/5ML suspension Take 15 mLs by mouth daily as needed for mild constipation.    . mirtazapine (REMERON) 30 MG tablet Take 1 tablet (30 mg total) by mouth at bedtime. 30 tablet 1  . omeprazole (PRILOSEC) 20 MG capsule Take 20-40 mg by mouth at bedtime.     . ondansetron (ZOFRAN) 8 MG tablet Take 1 tablet (8 mg total) by mouth every 8 (eight) hours as needed for nausea (Will not make drowsy). 30 tablet 2  . polyethylene glycol (MIRALAX / GLYCOLAX) packet Take 17 g by mouth daily as needed for mild constipation.     . predniSONE (DELTASONE) 10 MG tablet Take 1 tablet (10 mg total) by mouth daily with breakfast. 30 tablet 1  . prochlorperazine (COMPAZINE) 10 MG tablet Take 1 tablet (10 mg total) by mouth every 6 (six) hours as needed. (Patient taking differently: Take 10 mg by mouth every 6 (six) hours as needed for nausea or vomiting. ) 30 tablet 3  . rivaroxaban  (XARELTO) 20 MG TABS tablet Take 1 tablet (20 mg total) by mouth daily with supper. 30 tablet 9   No current facility-administered medications for this visit.    Facility-Administered Medications Ordered in Other Visits  Medication Dose Route Frequency Provider Last Rate Last Dose  . ferumoxytol (FERAHEME) 510 mg in sodium chloride 0.9 % 100 mL IVPB  510 mg Intravenous Once Livesay, [name], MD        PHYSICAL EXAMINATION: ECOG PERFORMANCE STATUS: 2 - Symptomatic, <50% confined to bed  Vitals:   08/05/16 1104  BP: 109/79  Pulse: 63  Resp: 17  Temp: 98.1 F (36.7 C)   Filed Weights   08/05/16 1104  Weight: 161 lb 6.4 oz (73.2 kg)    GENERAL:alert, no distress and comfortable.  She looks pale and appears to have lost weight recently SKIN: skin color, texture, turgor are normal, no rashes or significant lesions EYES: normal, Conjunctiva are pink and non-injected, sclera clear OROPHARYNX:no exudate, no erythema and lips, buccal mucosa, and tongue normal  NECK: supple, thyroid normal size, non-tender, without nodularity LYMPH:  no palpable lymphadenopathy in the cervical, axillary or inguinal LUNGS: clear to auscultation and percussion with normal breathing effort HEART: regular rate & rhythm and no murmurs and no lower extremity edema ABDOMEN:abdomen soft, non-tender and normal bowel sounds Musculoskeletal:no cyanosis of digits and no clubbing  NEURO: alert & oriented x 3 with fluent speech, no focal motor/sensory deficits  LABORATORY DATA:  I have reviewed the data as listed    Component Value Date/Time   NA 142 08/05/2016 1040   K 4.4 08/05/2016 1040   CL 106 07/25/2016 0648   CO2 25 08/05/2016 1040   GLUCOSE 119 08/05/2016 1040   BUN 34.3 (H) 08/05/2016 1040   CREATININE 1.6 (H) 08/05/2016 1040   CALCIUM 10.4 08/05/2016 1040   PROT 8.1 08/05/2016 1040   ALBUMIN 2.5 (L) 08/05/2016  1040   AST 12 08/05/2016 1040   ALT <6 08/05/2016 1040   ALKPHOS 115 08/05/2016 1040    BILITOT 0.54 08/05/2016 1040   GFRNONAA 32 (L) 07/25/2016 0648   GFRAA 37 (L) 07/25/2016 0648    No results found for: SPEP, UPEP  Lab Results  Component Value Date   WBC 7.8 08/05/2016   NEUTROABS 5.3 08/05/2016   HGB 8.9 (L) 08/05/2016   HCT 28.5 (L) 08/05/2016   MCV 94.4 08/05/2016   PLT 366 08/05/2016      Chemistry      Component Value Date/Time   NA 142 08/05/2016 1040   K 4.4 08/05/2016 1040   CL 106 07/25/2016 0648   CO2 25 08/05/2016 1040   BUN 34.3 (H) 08/05/2016 1040   CREATININE 1.6 (H) 08/05/2016 1040      Component Value Date/Time   CALCIUM 10.4 08/05/2016 1040   ALKPHOS 115 08/05/2016 1040   AST 12 08/05/2016 1040   ALT <6 08/05/2016 1040   BILITOT 0.54 08/05/2016 1040       RADIOGRAPHIC STUDIES: I have personally reviewed the radiological images as listed and agreed with the findings in the report. Dg Scapula Left  Result Date: 07/21/2016 CLINICAL DATA:  Pain after falling onto shoulder at noon yesterday. EXAM: LEFT SCAPULA - 2+ VIEWS COMPARISON:  None. FINDINGS: There is no evidence of fracture or other focal bone lesions. Soft tissues are unremarkable. IMPRESSION: Negative. Electronically Signed   By: Andreas Newport M.D.   On: 07/21/2016 00:26   Mr Jeri Cos ZO Contrast  Result Date: 07/21/2016 CLINICAL DATA:  History of ovarian cancer.  Nausea EXAM: MRI HEAD WITHOUT AND WITH CONTRAST TECHNIQUE: Multiplanar, multiecho pulse sequences of the brain and surrounding structures were obtained without and with intravenous contrast. CONTRAST:  31m MULTIHANCE GADOBENATE DIMEGLUMINE 529 MG/ML IV SOLN COMPARISON:  Head CT 02/17/2016 FINDINGS: Brain: The midline structures are normal. There is no focal diffusion restriction to indicate acute infarct. There is multifocal hyperintense T2-weighted signal within the periventricular and subcortical white matter, which may be seen in the setting of migraine headaches or early chronic microvascular disease; however,  it is also seen in normal patients of this age. No intraparenchymal hematoma or chronic microhemorrhage. Brain volume is normal for age without age-advanced or lobar predominant atrophy. The dura is normal and there is no extra-axial collection. Vascular: Major intracranial arterial and venous sinus flow voids are preserved. Skull and upper cervical spine: The visualized skull base, calvarium, upper cervical spine and extracranial soft tissues are normal. Sinuses/Orbits: No fluid levels or advanced mucosal thickening. No mastoid effusion. Normal orbits. IMPRESSION: No intracranial or calvarial metastatic disease. Electronically Signed   By: KUlyses JarredM.D.   On: 07/21/2016 16:30    ASSESSMENT & PLAN:  Ovarian cancer, bilateral ([place] The patient does not feel well She has increasing abdominal distention and pain I am worried that she may have rapid disease progression Recommend we proceed with treatment today but I plan to order urgent CT scan of the abdomen for evaluation I reviewed her recent discussion with her GYN oncologist regarding clinical trial Given her severe anemia and dependency to blood transfusion, I am doubtful she would be a clinical trial candidate I will see her back next week after CT scan is available  Acute saddle pulmonary embolism with acute cor pulmonale [place] She will continue on Xarelto, to continue indefinitely. Echocardiogram reviewed preserved ejection fraction   Anemia due to antineoplastic chemotherapy The causes of  anemia is multifactorial but most likely due to cancer chemotherapy side effects I recommend close monitoring of her blood work She would receive 2 units of blood transfusion whenever her hemoglobin is less than 8  CKD (chronic kidney disease) stage 3, GFR 30-59 ml/min She has mild acute renal failure, likely due to hypoperfusion from severe anemia Recommend increase fluid hydration  Moderate protein-calorie malnutrition ([place]) We discussed the  importance of regular intake as tolerated She has been started on multiple appetite stimulant I recommend starting her on prednisone  Cancer associated pain She has worsening abdominal pain due to cancer I plan to increase Dilaudid and reassess pain control next week   Orders Placed This Encounter  Procedures  . CT ABDOMEN PELVIS W CONTRAST    Standing Status:   Future    Standing Expiration Date:   09/09/2017    Order Specific Question:   Reason for exam:    Answer:   progressive abdominal distension, exclude disease progression    Order Specific Question:   Is the patient pregnant?    Answer:   No    Order Specific Question:   Preferred imaging location?    Answer:   [facility]   All questions were answered. The patient knows to call the clinic with any problems, questions or concerns. No barriers to learning was detected. I spent 30 minutes counseling the patient face to face. The total time spent in the appointment was 40 minutes and more than 50% was on counseling and review of test results     Heath Lark, MD 08/07/2016 8:05 PM

## 2016-08-07 NOTE — Assessment & Plan Note (Signed)
The patient does not feel well She has increasing abdominal distention and pain I am worried that she may have rapid disease progression Recommend we proceed with treatment today but I plan to order urgent CT scan of the abdomen for evaluation I reviewed her recent discussion with her GYN oncologist regarding clinical trial Given her severe anemia and dependency to blood transfusion, I am doubtful she would be a clinical trial candidate I will see her back next week after CT scan is available

## 2016-08-07 NOTE — Assessment & Plan Note (Signed)
She has mild acute renal failure, likely due to hypoperfusion from severe anemia Recommend increase fluid hydration

## 2016-08-07 NOTE — Assessment & Plan Note (Signed)
She will continue on Xarelto, to continue indefinitely. Echocardiogram reviewed preserved ejection fraction

## 2016-08-07 NOTE — Assessment & Plan Note (Signed)
We discussed the importance of regular intake as tolerated She has been started on multiple appetite stimulant I recommend starting her on prednisone

## 2016-08-08 ENCOUNTER — Encounter: Payer: Self-pay | Admitting: Hematology and Oncology

## 2016-08-08 ENCOUNTER — Ambulatory Visit (HOSPITAL_COMMUNITY)
Admission: RE | Admit: 2016-08-08 | Discharge: 2016-08-08 | Disposition: A | Discharge status: Home / Self Care | Payer: Managed Care, Other (non HMO) | Source: Ambulatory Visit | Attending: Hematology and Oncology | Admitting: Hematology and Oncology

## 2016-08-08 ENCOUNTER — Other Ambulatory Visit (HOSPITAL_BASED_OUTPATIENT_CLINIC_OR_DEPARTMENT_OTHER): Payer: Managed Care, Other (non HMO)

## 2016-08-08 DIAGNOSIS — D127 Benign neoplasm of rectosigmoid junction: Secondary | ICD-10-CM | POA: Insufficient documentation

## 2016-08-08 DIAGNOSIS — D6481 Anemia due to antineoplastic chemotherapy: Secondary | ICD-10-CM

## 2016-08-08 DIAGNOSIS — J9811 Atelectasis: Secondary | ICD-10-CM | POA: Diagnosis not present

## 2016-08-08 DIAGNOSIS — K429 Umbilical hernia without obstruction or gangrene: Secondary | ICD-10-CM | POA: Diagnosis not present

## 2016-08-08 DIAGNOSIS — M47815 Spondylosis without myelopathy or radiculopathy, thoracolumbar region: Secondary | ICD-10-CM | POA: Diagnosis not present

## 2016-08-08 DIAGNOSIS — Z9049 Acquired absence of other specified parts of digestive tract: Secondary | ICD-10-CM | POA: Diagnosis not present

## 2016-08-08 DIAGNOSIS — T451X5A Adverse effect of antineoplastic and immunosuppressive drugs, initial encounter: Secondary | ICD-10-CM | POA: Diagnosis not present

## 2016-08-08 DIAGNOSIS — C569 Malignant neoplasm of unspecified ovary: Secondary | ICD-10-CM | POA: Insufficient documentation

## 2016-08-08 DIAGNOSIS — C561 Malignant neoplasm of right ovary: Secondary | ICD-10-CM

## 2016-08-08 DIAGNOSIS — R599 Enlarged lymph nodes, unspecified: Secondary | ICD-10-CM | POA: Insufficient documentation

## 2016-08-08 DIAGNOSIS — J90 Pleural effusion, not elsewhere classified: Secondary | ICD-10-CM | POA: Insufficient documentation

## 2016-08-08 DIAGNOSIS — R180 Malignant ascites: Secondary | ICD-10-CM | POA: Diagnosis not present

## 2016-08-08 DIAGNOSIS — C563 Malignant neoplasm of bilateral ovaries: Secondary | ICD-10-CM

## 2016-08-08 DIAGNOSIS — C562 Malignant neoplasm of left ovary: Secondary | ICD-10-CM | POA: Diagnosis not present

## 2016-08-08 LAB — COMPREHENSIVE METABOLIC PANEL
ALT: 11 U/L (ref 0–55)
AST: 22 U/L (ref 5–34)
Albumin: 2.5 g/dL — ABNORMAL LOW (ref 3.5–5.0)
Alkaline Phosphatase: 122 U/L (ref 40–150)
Anion Gap: 14 mEq/L — ABNORMAL HIGH (ref 3–11)
BILIRUBIN TOTAL: 0.6 mg/dL (ref 0.20–1.20)
BUN: 46.9 mg/dL — ABNORMAL HIGH (ref 7.0–26.0)
CHLORIDE: 104 meq/L (ref 98–109)
CO2: 22 meq/L (ref 22–29)
Calcium: 10.4 mg/dL (ref 8.4–10.4)
Creatinine: 1.7 mg/dL — ABNORMAL HIGH (ref 0.6–1.1)
EGFR: 39 mL/min/{1.73_m2} — AB (ref >90)
GLUCOSE: 106 mg/dL (ref 70–140)
Potassium: 4.5 mEq/L (ref 3.5–5.1)
SODIUM: 141 meq/L (ref 136–145)
TOTAL PROTEIN: 8 g/dL (ref 6.4–8.3)

## 2016-08-08 LAB — CBC WITH DIFFERENTIAL/PLATELET
BASO%: 1 % (ref 0.0–2.0)
BASOS ABS: 0.1 10*3/uL (ref 0.0–0.1)
EOS ABS: 0.1 10*3/uL (ref 0.0–0.5)
EOS%: 0.8 % (ref 0.0–7.0)
HEMATOCRIT: 25.5 % — AB (ref 34.8–46.6)
HEMOGLOBIN: 8.5 g/dL — AB (ref 11.6–15.9)
LYMPH#: 1 10*3/uL (ref 0.9–3.3)
LYMPH%: 11.9 % — ABNORMAL LOW (ref 14.0–49.7)
MCH: 30.5 pg (ref 25.1–34.0)
MCHC: 33.4 g/dL (ref 31.5–36.0)
MCV: 91.3 fL (ref 79.5–101.0)
MONO#: 0.1 10*3/uL (ref 0.1–0.9)
MONO%: 0.8 % (ref 0.0–14.0)
NEUT#: 7.3 10*3/uL — ABNORMAL HIGH (ref 1.5–6.5)
NEUT%: 85.5 % — AB (ref 38.4–76.8)
PLATELETS: 412 10*3/uL — AB (ref 145–400)
RBC: 2.79 10*6/uL — ABNORMAL LOW (ref 3.70–5.45)
RDW: 15.8 % — AB (ref 11.2–14.5)
WBC: 8.5 10*3/uL (ref 3.9–10.3)

## 2016-08-08 MED ORDER — HEPARIN SOD (PORK) LOCK FLUSH 100 UNIT/ML IV SOLN
INTRAVENOUS | Status: AC
  Administered 2016-08-08: 500 [IU]
  Filled 2016-08-08: qty 5

## 2016-08-08 MED ORDER — IOPAMIDOL (ISOVUE-300) INJECTION 61%
100.0000 mL | Freq: Once | INTRAVENOUS | Status: AC | PRN
  Administered 2016-08-08: 80 mL via INTRAVENOUS

## 2016-08-08 MED ORDER — SODIUM CHLORIDE 0.9 % IV SOLN
Freq: Once | INTRAVENOUS | Status: AC
  Administered 2016-08-08: 09:00:00 via INTRAVENOUS

## 2016-08-08 MED ORDER — IOPAMIDOL (ISOVUE-300) INJECTION 61%
INTRAVENOUS | Status: AC
  Filled 2016-08-08: qty 100

## 2016-08-08 NOTE — Progress Notes (Signed)
Called patient to inform of PA approval for Hydromorphone. Patient has appointments today. Left a voicemail with my contact name and number should she have any additional questions.

## 2016-08-08 NOTE — Progress Notes (Signed)
Spoke with Tammy, Dr. Calton Dach nurse.  She advised that patients labwork look good and patient will not need the blood that was scheduled today.  Patient only to recv 1 L of NS over 2hours. / Patient updated upon her arrival here at the infusion center.

## 2016-08-08 NOTE — Discharge Instructions (Signed)
Today, you received an IV infusion of 1 liter of normal saline; Please follow-up with Dr. Alvy Bimler for additional instructions.

## 2016-08-08 NOTE — Progress Notes (Signed)
Pt arrived for infusion of 1 liter IV normal saline; port accessed, infusion completed; no complications noted; port remained accessed upon discharge per MD order; pt accompanied by family upon discharge; discharge instructions explained, given, and signed; all questions answered  Ordering Provider: Natale Lay  Diagnosis: Ovarian Cancer

## 2016-08-08 NOTE — Progress Notes (Signed)
Following up on request for prescription drug information which was not received via fax.  Called [facility]([place]) to initiate PA due to not having detailed information to do on Cover My Meds. Answered clinical questions.  PA approved 08/08/16-08/08/17 ref#[id number].  Called [facility]([place]) to advise of approval. She states it went through.

## 2016-08-09 ENCOUNTER — Other Ambulatory Visit: Payer: Self-pay | Admitting: Hematology and Oncology

## 2016-08-09 DIAGNOSIS — C569 Malignant neoplasm of unspecified ovary: Secondary | ICD-10-CM

## 2016-08-11 ENCOUNTER — Telehealth: Payer: Self-pay

## 2016-08-11 NOTE — Telephone Encounter (Signed)
-----   Message from Heath Lark, MD sent at 08/09/2016  7:27 AM EDT ----- Regarding: CT is stable pls let her know I have added her to be seen on Tuesday before her scheduled Rx to discuss Rx results with her but CT looked stable ----- Message ----- From: Interface, Rad Results In Sent: 08/08/2016   9:36 PM To: Heath Lark, MD

## 2016-08-11 NOTE — Telephone Encounter (Signed)
Called with below message. 

## 2016-08-12 ENCOUNTER — Other Ambulatory Visit: Payer: Self-pay | Admitting: Hematology and Oncology

## 2016-08-12 ENCOUNTER — Other Ambulatory Visit (HOSPITAL_BASED_OUTPATIENT_CLINIC_OR_DEPARTMENT_OTHER): Payer: Managed Care, Other (non HMO)

## 2016-08-12 ENCOUNTER — Ambulatory Visit (HOSPITAL_BASED_OUTPATIENT_CLINIC_OR_DEPARTMENT_OTHER): Payer: Managed Care, Other (non HMO) | Admitting: Hematology and Oncology

## 2016-08-12 ENCOUNTER — Ambulatory Visit (HOSPITAL_BASED_OUTPATIENT_CLINIC_OR_DEPARTMENT_OTHER): Payer: Managed Care, Other (non HMO)

## 2016-08-12 ENCOUNTER — Ambulatory Visit (HOSPITAL_COMMUNITY)
Admission: RE | Admit: 2016-08-12 | Discharge: 2016-08-12 | Disposition: A | Discharge status: Home / Self Care | Payer: Managed Care, Other (non HMO) | Source: Ambulatory Visit | Attending: Hematology | Admitting: Hematology

## 2016-08-12 VITALS — BP 108/80 | HR 97 | Temp 97.8°F | Resp 17

## 2016-08-12 DIAGNOSIS — R180 Malignant ascites: Secondary | ICD-10-CM

## 2016-08-12 DIAGNOSIS — D6481 Anemia due to antineoplastic chemotherapy: Secondary | ICD-10-CM

## 2016-08-12 DIAGNOSIS — C562 Malignant neoplasm of left ovary: Secondary | ICD-10-CM | POA: Diagnosis not present

## 2016-08-12 DIAGNOSIS — N183 Chronic kidney disease, stage 3 unspecified: Secondary | ICD-10-CM

## 2016-08-12 DIAGNOSIS — C563 Malignant neoplasm of bilateral ovaries: Secondary | ICD-10-CM

## 2016-08-12 DIAGNOSIS — T451X5A Adverse effect of antineoplastic and immunosuppressive drugs, initial encounter: Principal | ICD-10-CM

## 2016-08-12 DIAGNOSIS — Z95828 Presence of other vascular implants and grafts: Secondary | ICD-10-CM

## 2016-08-12 DIAGNOSIS — J90 Pleural effusion, not elsewhere classified: Secondary | ICD-10-CM

## 2016-08-12 DIAGNOSIS — C561 Malignant neoplasm of right ovary: Secondary | ICD-10-CM

## 2016-08-12 DIAGNOSIS — I2602 Saddle embolus of pulmonary artery with acute cor pulmonale: Secondary | ICD-10-CM

## 2016-08-12 DIAGNOSIS — Z5111 Encounter for antineoplastic chemotherapy: Secondary | ICD-10-CM | POA: Diagnosis not present

## 2016-08-12 DIAGNOSIS — R64 Cachexia: Secondary | ICD-10-CM

## 2016-08-12 DIAGNOSIS — Z452 Encounter for adjustment and management of vascular access device: Secondary | ICD-10-CM | POA: Diagnosis not present

## 2016-08-12 DIAGNOSIS — C569 Malignant neoplasm of unspecified ovary: Secondary | ICD-10-CM

## 2016-08-12 LAB — COMPREHENSIVE METABOLIC PANEL
ALT: 19 U/L (ref 0–55)
ANION GAP: 10 meq/L (ref 3–11)
AST: 33 U/L (ref 5–34)
Albumin: 2.4 g/dL — ABNORMAL LOW (ref 3.5–5.0)
Alkaline Phosphatase: 109 U/L (ref 40–150)
BUN: 32.1 mg/dL — ABNORMAL HIGH (ref 7.0–26.0)
CALCIUM: 10.4 mg/dL (ref 8.4–10.4)
CHLORIDE: 105 meq/L (ref 98–109)
CO2: 25 mEq/L (ref 22–29)
CREATININE: 1.4 mg/dL — AB (ref 0.6–1.1)
EGFR: 48 mL/min/{1.73_m2} — AB (ref >90)
Glucose: 121 mg/dl (ref 70–140)
Potassium: 4.2 mEq/L (ref 3.5–5.1)
Sodium: 141 mEq/L (ref 136–145)
Total Bilirubin: 0.46 mg/dL (ref 0.20–1.20)
Total Protein: 7.5 g/dL (ref 6.4–8.3)

## 2016-08-12 LAB — CBC WITH DIFFERENTIAL/PLATELET
BASO%: 0.3 % (ref 0.0–2.0)
Basophils Absolute: 0 10*3/uL (ref 0.0–0.1)
EOS%: 0.1 % (ref 0.0–7.0)
Eosinophils Absolute: 0 10*3/uL (ref 0.0–0.5)
HCT: 24.1 % — ABNORMAL LOW (ref 34.8–46.6)
HEMOGLOBIN: 7.6 g/dL — AB (ref 11.6–15.9)
LYMPH%: 17.1 % (ref 14.0–49.7)
MCH: 29.7 pg (ref 25.1–34.0)
MCHC: 31.5 g/dL (ref 31.5–36.0)
MCV: 94.1 fL (ref 79.5–101.0)
MONO#: 1.1 10*3/uL — ABNORMAL HIGH (ref 0.1–0.9)
MONO%: 14.1 % — AB (ref 0.0–14.0)
NEUT%: 68.4 % (ref 38.4–76.8)
NEUTROS ABS: 5.2 10*3/uL (ref 1.5–6.5)
Platelets: 151 10*3/uL (ref 145–400)
RBC: 2.56 10*6/uL — ABNORMAL LOW (ref 3.70–5.45)
RDW: 16.2 % — AB (ref 11.2–14.5)
WBC: 7.6 10*3/uL (ref 3.9–10.3)
lymph#: 1.3 10*3/uL (ref 0.9–3.3)

## 2016-08-12 LAB — PREPARE RBC (CROSSMATCH)

## 2016-08-12 MED ORDER — SODIUM CHLORIDE 0.9 % IV SOLN
250.0000 mL | Freq: Once | INTRAVENOUS | Status: AC
  Administered 2016-08-12: 250 mL via INTRAVENOUS

## 2016-08-12 MED ORDER — HEPARIN SOD (PORK) LOCK FLUSH 100 UNIT/ML IV SOLN
500.0000 [IU] | Freq: Once | INTRAVENOUS | Status: DC | PRN
  Filled 2016-08-12: qty 5

## 2016-08-12 MED ORDER — ACETAMINOPHEN 325 MG PO TABS
ORAL_TABLET | ORAL | Status: AC
  Filled 2016-08-12: qty 2

## 2016-08-12 MED ORDER — PROCHLORPERAZINE MALEATE 10 MG PO TABS
ORAL_TABLET | ORAL | Status: AC
  Filled 2016-08-12: qty 1

## 2016-08-12 MED ORDER — SODIUM CHLORIDE 0.9% FLUSH
10.0000 mL | Freq: Once | INTRAVENOUS | Status: AC
  Administered 2016-08-12: 10 mL
  Filled 2016-08-12: qty 10

## 2016-08-12 MED ORDER — SODIUM CHLORIDE 0.9 % IV SOLN
Freq: Once | INTRAVENOUS | Status: AC
  Administered 2016-08-12: 13:00:00 via INTRAVENOUS

## 2016-08-12 MED ORDER — PROCHLORPERAZINE MALEATE 10 MG PO TABS
10.0000 mg | ORAL_TABLET | Freq: Once | ORAL | Status: AC
  Administered 2016-08-12: 10 mg via ORAL

## 2016-08-12 MED ORDER — GEMCITABINE HCL CHEMO INJECTION 1 GM/26.3ML
1100.0000 mg | Freq: Once | INTRAVENOUS | Status: AC
  Administered 2016-08-12: 1100 mg via INTRAVENOUS
  Filled 2016-08-12: qty 28.93

## 2016-08-12 MED ORDER — SODIUM CHLORIDE 0.9% FLUSH
10.0000 mL | INTRAVENOUS | Status: DC | PRN
  Filled 2016-08-12: qty 10

## 2016-08-12 MED ORDER — DIPHENHYDRAMINE HCL 25 MG PO CAPS
ORAL_CAPSULE | ORAL | Status: AC
  Filled 2016-08-12: qty 1

## 2016-08-12 MED ORDER — ACETAMINOPHEN 325 MG PO TABS
650.0000 mg | ORAL_TABLET | Freq: Once | ORAL | Status: AC
  Administered 2016-08-12: 650 mg via ORAL

## 2016-08-12 MED ORDER — DIPHENHYDRAMINE HCL 25 MG PO CAPS
25.0000 mg | ORAL_CAPSULE | Freq: Once | ORAL | Status: AC
  Administered 2016-08-12: 25 mg via ORAL

## 2016-08-12 NOTE — Patient Instructions (Signed)
Blood Transfusion, Adult, Care After This sheet gives you information about how to care for yourself after your procedure. Your health care provider may also give you more specific instructions. If you have problems or questions, contact your health care provider. What can I expect after the procedure? After your procedure, it is common to have:  Bruising and soreness where the IV tube was inserted.  Headache.  Follow these instructions at home:  Take over-the-counter and prescription medicines only as told by your health care provider.  Return to your normal activities as told by your health care provider.  Follow instructions from your health care provider about how to take care of your IV insertion site. Make sure you: ? Wash your hands with soap and water before you change your bandage (dressing). If soap and water are not available, use hand sanitizer. ? Change your dressing as told by your health care provider.  Check your IV insertion site every day for signs of infection. Check for: ? More redness, swelling, or pain. ? More fluid or blood. ? Warmth. ? Pus or a bad smell. Contact a health care provider if:  You have more redness, swelling, or pain around the IV insertion site.  You have more fluid or blood coming from the IV insertion site.  Your IV insertion site feels warm to the touch.  You have pus or a bad smell coming from the IV insertion site.  Your urine turns pink, red, or brown.  You feel weak after doing your normal activities. Get help right away if:  You have signs of a serious allergic or immune system reaction, including: ? Itchiness. ? Hives. ? Trouble breathing. ? Anxiety. ? Chest or lower back pain. ? Fever, flushing, and chills. ? Rapid pulse. ? Rash. ? Diarrhea. ? Vomiting. ? Dark urine. ? Serious headache. ? Dizziness. ? Stiff neck. ? Yellow coloration of the face or the white parts of the eyes (jaundice). This information is not  intended to replace advice given to you by your health care provider. Make sure you discuss any questions you have with your health care provider. Document Released: 02/03/2014 Document Revised: 09/12/2015 Document Reviewed: 07/30/2015 Elsevier Interactive Patient Education  2018 [place] Discharge Instructions for Patients Receiving Chemotherapy  Today you received the following chemotherapy agents: Gemzar.  To help prevent nausea and vomiting after your treatment, we encourage you to take your nausea medication   If you develop nausea and vomiting that is not controlled by your nausea medication, call the clinic.   BELOW ARE SYMPTOMS THAT SHOULD BE REPORTED IMMEDIATELY:  *FEVER GREATER THAN 100.5 F  *CHILLS WITH OR WITHOUT FEVER  NAUSEA AND VOMITING THAT IS NOT CONTROLLED WITH YOUR NAUSEA MEDICATION  *UNUSUAL SHORTNESS OF BREATH  *UNUSUAL BRUISING OR BLEEDING  TENDERNESS IN MOUTH AND THROAT WITH OR WITHOUT PRESENCE OF ULCERS  *URINARY PROBLEMS  *BOWEL PROBLEMS  UNUSUAL RASH Items with * indicate a potential emergency and should be followed up as soon as possible.  Feel free to call the clinic you have any questions or concerns. The clinic phone number is [phone].  Please show the [facility] at check-in to the Emergency Department and triage nurse.

## 2016-08-13 ENCOUNTER — Ambulatory Visit: Payer: Self-pay | Admitting: Hematology and Oncology

## 2016-08-13 ENCOUNTER — Encounter: Payer: Self-pay | Admitting: Hematology and Oncology

## 2016-08-13 DIAGNOSIS — J90 Pleural effusion, not elsewhere classified: Secondary | ICD-10-CM | POA: Insufficient documentation

## 2016-08-13 LAB — BPAM RBC
BLOOD PRODUCT EXPIRATION DATE: 201808102359
ISSUE DATE / TIME: 201807171417
Unit Type and Rh: 7300

## 2016-08-13 LAB — TYPE AND SCREEN
ABO/RH(D): B POS
Antibody Screen: NEGATIVE
Unit division: 0

## 2016-08-13 LAB — CA 125: CANCER ANTIGEN (CA) 125: 24.2 U/mL (ref 0.0–38.1)

## 2016-08-13 NOTE — Assessment & Plan Note (Signed)
The patient is started on mirtazapine. She is not able to eat much due to early satiety form significant ascites I continue to encourage her frequent small meals

## 2016-08-13 NOTE — Assessment & Plan Note (Signed)
With aggressive blood transfusion support, her kidney function is improving Continue close monitoring of her kidney function.

## 2016-08-13 NOTE — Assessment & Plan Note (Signed)
She has severe, malignant ascites I recommend therapeutic paracentesis I recommend she hold Xarelto until after paracentesis.

## 2016-08-13 NOTE — Progress Notes (Signed)
[facility] OFFICE PROGRESS NOTE  Patient Care Team: Caryl Bis, MD as PCP - General (Family Medicine)  SUMMARY OF ONCOLOGIC HISTORY:   Ovarian cancer, bilateral ([place])   03/16/2014 Imaging    Outside hospital CT scan of the abdomen and pelvis reviewed the pelvic mass measured 9.9 cm 20.1 cm 17.8 cm consistent with ovarian cancer. 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 Alycia Rossetti performed EXPLORATORY LAPAROTOMY, EXPLORATORY CELIOTOMY WITH OR WITHOUT BIOPSY(S) URETEROLYSIS, WITH OR WITHOUT REPOSITIONING OF URETER FOR RETROPERITONEAL FIBROSIS BILATERAL S&O W/OMENTECTOMY, TAH/RADICAL DISSECT FOR DEBULK; W/PELVIC LYMPHAD/LMTD PARA-AORTIC LYMPHADENECT         03/31/2014 Pathology Results    Diagnosis:  FSA, A:Ovary and fallopian tube, left, left salpingo-oophorectomy  Histologic type:Clear cell carcinoma (see comment)   Histologic grade:high grade/grade 3   Tumor site: (ovary/primary peritoneal)ovary   Tumor size: (greatest dimension)13 x 9.5 x 6.2 cm   Ovarian surface involvement:not identified   Status of capsule: (intact/ruptured)intact   Extent of involvement of other tissues/organs (select all that apply):  Right ovary  Left ovary  Lymphovascular space invasion:present   Regional lymph nodes (see other specimens): Total number involved:1 Total number examined:10 Size of largest metastasis:microscopic focus only, approximately 1 mm;  necrosis, fibrosis, and foreign body type giant cell reaction present within two additional lymph nodes, changes suggestive of treatment effect or regression   Additional pathologic  findings:none   AJCC Pathologic Stage: pT3a1(i) pN1 pMx FIGO (2014classification) Stage Grouping:IIIA1(i)  Note:This pathologic stage assessment is based on information available at the time of this report, and is subject to change pending clinical review and additional information.  B:Ovary and fallopian tube, right, right salpingo-oophorectomy  Histologic type:clear cell carcinoma (B8)  Histologic grade:high grade/grade 3   Tumor site: (ovary/primary peritoneal)ovary   Tumor size: (greatest dimension)11 x 9 x 4.5 cm   Ovarian surface involvement:not identified   Status of capsule: (intact/ruptured)ruptured   C:Uterus and cervix, hysterectomy  Cervix  - No tumor seen   Endometrium  - Atrophic endometrium   Myometrium  - Adenomyosis and subserosal endometriosis   D:Omentum, omentectomy - No tumor seen   E:Lymph nodes, right periaortic, regional dissection - Three lymph nodes, no tumor seen (0/3)   F:Lymph nodes, right pelvic, regional dissection - 1 out of 6 lymph nodes shows minute (approximately 2 mm) focus of metastatic adenocarcinoma (1/6) - Two additional lymph nodes show necrosis, fibrosis, and foreign body type giant cell reaction suggestive of treatment effect versus regression   G:Lymph node, left pelvic, regional dissection - One lymph node, no tumor seen (0/1)   COMMENT: Multiple immunostains are performed on block A12 to further evaluate the case for metastasis to the bilateral ovaries form a distant site and to evaluate for mucinous versus clear cell change. The tumor shows focal intracytoplasmic mucin on mucin stains, and immunostains shows the tumor to be cytokeratin 7 positive, cytokeratin 20 negative, CDX2 negative, PAX8 positive, and negative for chromogranin, synaptophysin, TTF1, p53, ER, and PR, and rare positive cells on CA125, with low p53 (approximately 5%). Napsin is  positive. RCC and CD10 are negative. PAS is positive and the positive staining does not digest with PASD.  These findings supporting a tumor of primary gynecologic origin. Overall, a clear cell carcinoma is favored. Seromucinous features cannot be excluded based on the presence of scattered, faint intracytoplasmic mucin noted on mucin stain and the morphology with focal signet ring cells. Although uncommon, this has been described, however, in clear cell carcinomas, therefore the above classification is favored       03/31/2014 Tumor Marker    Patient's tumor was tested for the following markers: CA125 Results of the tumor marker test revealed 41.3      05/11/2014 Tumor Marker    Patient's tumor was tested for the following markers: CA125 Results of the tumor marker test revealed 8      05/12/2014 - 09/01/2014 Chemotherapy    She received adjuvant chemotherapy with carbo/taxol for 6 cycles      05/12/2014 Genetic Testing    Genetics testing:OvaNext panel 05-12-14 identified a variant of uncertain significance, RAD51D, p.T103A      06/01/2014 Tumor Marker    Patient's tumor was tested for the following markers: CA125 Results of the tumor marker test revealed 10      06/27/2014 Tumor Marker    Patient's tumor was tested for the following markers: CA125 Results of the tumor marker test revealed 5      08/10/2014 Tumor Marker    .Patient's tumor was tested for the following markers: CA125 Results of the tumor marker test revealed 6      09/14/2014 Imaging    Segment of circumferential bowel wall narrowing at the junction of the sigmoid colon and rectum is concerning for a serosal metastasis with subsequent constriction of the bowel lumen. There is a moderate volume of stool throughout the colon suggesting some degree of early obstruction. There is stool in the rectum. 2. Status post hysterectomy and resection of the cystic ovarian masses. 3. Three nodules adherent to the transverse colon are  concerning for serosal metastasis. 4. Subcutaneous thickening at the umbilicus likely related to surgery.      09/14/2014 Tumor Marker    Patient's tumor was tested for the following markers: CA125 Results of the tumor marker test revealed 5      09/27/2014 PET scan    No abnormal hypermetabolism above normal colon at the rectosigmoid junction, at the site of suspected mass in narrowing on 09/14/2014. 2. Small soft tissue nodules along the distal transverse colon do not show abnormal hypermetabolism but may be too small for PET resolution.      10/05/2014 Procedure    She underwent sigmoid colon biopsy      10/05/2014 Pathology Results    Sigmoid colon biopsy revealed acute colitis, nonspecific      10/18/2014 Tumor Marker    Patient's tumor was tested for the following markers: CA125 Results of the tumor marker test revealed 5      01/08/2015 Tumor Marker    Patient's tumor was tested for the following markers: CA125 Results of the tumor marker test revealed 4      04/04/2015 Tumor Marker    Patient's tumor was tested for the following markers: CA125 Results of the tumor marker test revealed 6.9      07/12/2015 Tumor Marker    Patient's tumor was tested for the following markers: CA125 Results of the tumor marker test revealed 7.5      10/10/2015 Tumor Marker    Patient's tumor was tested for the following markers: CA125 Results of the tumor marker test revealed 11.4      11/05/2015  Tumor Marker    Patient's tumor was tested for the following markers: CA125 Results of the tumor marker test revealed 18.4      11/19/2015 Imaging    1. 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 2. New large subcapsular metastatic masses along LEFT and RIGHT hepatic lobes and positioned between the diaphragm and the RIGHT hepatic lobe. 3. Peritoneal metastasis with new nodules within the mesentery of the small bowel and colon. 4. Potential serosal  implant within the proximal sigmoid colon. No bowel obstruction 5. Small amount of ascites is new from prior. 6. Mild pelvic lymphadenopathy consistent with  metastatic disease      11/30/2015 Tumor Marker    Patient's tumor was tested for the following markers: CA125 Results of the tumor marker test revealed 32.4      12/04/2015 Procedure    Ultrasound and fluoroscopically guided right internal jugular single lumen power port catheter insertion. Tip in the SVC/RA junction. Catheter ready for use      12/06/2015 - 12/28/2015 Chemotherapy    She received 2 cycles of carbo/taxol, DC due to disease progression      12/21/2015 Tumor Marker    Patient's tumor was tested for the following markers: CA125 Results of the tumor marker test revealed 48.4      01/14/2016 Tumor Marker    Patient's tumor was tested for the following markers: CA125 Results of the tumor marker test revealed 74.6      01/16/2016 Imaging    Prominent interval progression of metastatic disease in the abdomen and pelvis. Large capsular lesions along the liver are associated with enlarging and new omental, mesenteric and peritoneal nodules and masses throughout the abdomen and pelvis. 2. Interval progression of intraperitoneal free fluid. 3. Interval development of new small left pleural effusion. 4. Lesion involving the sigmoid colon has progressed in the interval. No evidence for colonic obstruction at this time.      01/16/2016 Imaging    LV EF: 55% -   60%      01/25/2016 - 05/02/2016 Chemotherapy    She received Avastin with Doxil. Avastin was discontinued when she developed massive PE.  Doxil was stopped due to disease progression       02/16/2016 Imaging    Massive bilateral occlusive pulmonary emboli. CT evidence of right heart strain (RV/LV Ratio = 1.46) consistent with at least submassive (intermediate risk) PE. The presence of right heart strain has been associated with an increased risk of morbidity and  mortality. 2. Large layering RIGHT pleural effusion. Concern for malignant effusion. 3. New large mass in the gastrohepatic ligament (6 cm) concerning for progression of ovarian carcinoma.      02/17/2016 - 02/21/2016 Hospital Admission    She was admitted to the hospital and was found to have DVT and severe PE with heart strain. She was discharged home on Lovenox      02/18/2016 Imaging    Normal left ventricular size and systolic function. Severely dilated right ventricle with moderately decreased systolic function and severe pulmonary hypertension suspicious for pulmonary embolism.      02/18/2016 Imaging    Findings consistent with acute deep vein thrombosis involving the popliteal and distal femoral veins of the left lower extremity.       02/25/2016 Tumor Marker    Patient's tumor was tested for the following markers: CA125 Results of the tumor marker test revealed 89.5      03/25/2016 Imaging    No  acute findings within the abdomen or pelvis. 2. Large volume of peritoneal tumor is identified within the abdomen and pelvis. This is stable to slightly increased in size in the interval. 3. Decrease in volume of ascites 4. Left pleural effusion is slightly increased in volume from previous exam.      03/28/2016 Adverse Reaction    She received 2 units of blood transfusion      04/04/2016 Tumor Marker    Patient's tumor was tested for the following markers: CA125 Results of the tumor marker test revealed 71.2      05/02/2016 Tumor Marker    Patient's tumor was tested for the following markers: CA125 Results of the tumor marker test revealed 58.2      05/22/2016 Imaging    Left ventricle: The cavity size was normal. Systolic function was normal. Wall motion was normal; there were no regional wall motion abnormalities. Doppler parameters are consistent with abnormal left ventricular relaxation (grade 1 diastolic dysfunction).      05/28/2016 Imaging    CT: 1. Interval increased burden  of bulky peritoneal carcinomatosis as detailed. Small volume malignant ascites is increased. 2. Stable mild right pelvic adenopathy. 3. Moderate dependent left pleural effusion is increased. 4. Near complete resolution of previously visualized pulmonary emboli as detailed . 5. Stable moderate umbilical hernia containing small bowel loops, with no bowel complication.      05/28/2016 Tumor Marker    Patient's tumor was tested for the following markers: CA125 Results of the tumor marker test revealed 44.5      07/08/2016 -  Chemotherapy    She received Gemzar      07/21/2016 Imaging    MR brain: No intracranial or calvarial metastatic disease.      08/08/2016 Imaging    1. Large volume malignant ascites, significantly increased . 2. Bulky peritoneal tumor implants are stable in number and size. 3. Small to moderate dependent left pleural effusion, mildly decreased . 4. Stable mild right pelvic adenopathy. 5. Moderate paraumbilical hernia containing ascitic fluid, increased. 6. No evidence of bowel obstruction. Stable bulky serosal tumor implant at the rectosigmoid junction .      08/12/2016 Tumor Marker    Patient's tumor was tested for the following markers: CA125 Results of the tumor marker test revealed 24.2       INTERVAL HISTORY: Please see below for problem oriented charting. She returns with her husband and brother for further evaluation She has progressive abdominal distention causing problems with eating She complain of fatigue and shortness of breath with minimal exertion The patient denies any recent signs or symptoms of bleeding such as spontaneous epistaxis, hematuria or hematochezia. She denies significant abdominal pain No nausea or vomiting with treatment  REVIEW OF SYSTEMS:   Constitutional: Denies fevers, chills or abnormal weight loss Eyes: Denies blurriness of vision Ears, nose, mouth, throat, and face: Denies mucositis or sore throat Cardiovascular: Denies  palpitation, chest discomfort  Gastrointestinal:  Denies nausea, heartburn or change in bowel habits Skin: Denies abnormal skin rashes Lymphatics: Denies new lymphadenopathy or easy bruising Neurological:Denies numbness, tingling or new weaknesses Behavioral/Psych: Mood is stable, no new changes  All other systems were reviewed with the patient and are negative.  I have reviewed the past medical history, past surgical history, social history and family history with the patient and they are unchanged from previous note.  ALLERGIES:  is allergic to sulfa antibiotics.  MEDICATIONS:  Current Outpatient Prescriptions  Medication Sig Dispense Refill  . acetaminophen (TYLENOL)  500 MG tablet Take 1,000 mg by mouth every 6 (six) hours as needed for mild pain, moderate pain, fever or headache.     . cetirizine (ZYRTEC) 10 MG tablet Take 10 mg by mouth at bedtime.     . fluticasone (FLONASE) 50 MCG/ACT nasal spray Place 1 spray into both nostrils daily as needed. (Patient taking differently: Place 1 spray into both nostrils daily as needed for rhinitis. ) 16 g 0  . Gemcitabine HCl (GEMZAR IV) Inject into the vein every 28 (twenty-eight) days. Gemzar 1,440m in 0.9% sodium chloride 2560mchemo infusion.   To be given at [id number] over 30 minutes.     . HMarland KitchenDROmorphone (DILAUDID) 4 MG tablet Take 1 tablet (4 mg total) by mouth every 4 (four) hours as needed for severe pain. 60 tablet 0  . lactose free nutrition (BOOST PLUS) LIQD Take 237 mLs by mouth 3 (three) times daily with meals. 30 Can 0  . lidocaine-prilocaine (EMLA) cream Apply 1-2 hours prior to [facility]. (Patient taking differently: Apply 1 application topically once as needed. 1-2 hours prior to port access) 30 g 1  . LORazepam (ATIVAN) 0.5 MG tablet Take 1 tablet (0.5 mg total) by mouth every 6 (six) hours as needed for anxiety. 60 tablet 0  . magnesium hydroxide (MILK OF MAGNESIA) 400 MG/5ML suspension Take 15 mLs by mouth  daily as needed for mild constipation.    . mirtazapine (REMERON) 30 MG tablet Take 1 tablet (30 mg total) by mouth at bedtime. 30 tablet 1  . omeprazole (PRILOSEC) 20 MG capsule Take 20-40 mg by mouth at bedtime.     . ondansetron (ZOFRAN) 8 MG tablet Take 1 tablet (8 mg total) by mouth every 8 (eight) hours as needed for nausea (Will not make drowsy). 30 tablet 2  . polyethylene glycol (MIRALAX / GLYCOLAX) packet Take 17 g by mouth daily as needed for mild constipation.     . predniSONE (DELTASONE) 10 MG tablet Take 1 tablet (10 mg total) by mouth daily with breakfast. 30 tablet 1  . prochlorperazine (COMPAZINE) 10 MG tablet Take 1 tablet (10 mg total) by mouth every 6 (six) hours as needed. (Patient taking differently: Take 10 mg by mouth every 6 (six) hours as needed for nausea or vomiting. ) 30 tablet 3  . rivaroxaban (XARELTO) 20 MG TABS tablet Take 1 tablet (20 mg total) by mouth daily with supper. 30 tablet 9   No current facility-administered medications for this visit.    Facility-Administered Medications Ordered in Other Visits  Medication Dose Route Frequency Provider Last Rate Last Dose  . ferumoxytol (FERAHEME) 510 mg in sodium chloride 0.9 % 100 mL IVPB  510 mg Intravenous Once Livesay, [name], MD        PHYSICAL EXAMINATION: ECOG PERFORMANCE STATUS: 2 - Symptomatic, <50% confined to bed  Vitals:   08/12/16 1151  BP: 123/79  Pulse: (!) 111  Resp: 18   Filed Weights   08/12/16 1151  Weight: 164 lb 12.8 oz (74.8 kg)    GENERAL:alert, no distress and comfortable.  She looks pale SKIN: skin color, texture, turgor are normal, no rashes or significant lesions EYES: normal, Conjunctiva are pink and non-injected, sclera clear OROPHARYNX:no exudate, no erythema and lips, buccal mucosa, and tongue normal  NECK: supple, thyroid normal size, non-tender, without nodularity LYMPH:  no palpable lymphadenopathy in the cervical, axillary or inguinal LUNGS: She has reduced breath  sounds on the left lung base  HEART: regular rate & rhythm and no murmurs and no lower extremity edema ABDOMEN:abdomen soft, significantly distended with ascites Musculoskeletal:no cyanosis of digits and no clubbing  NEURO: alert & oriented x 3 with fluent speech, no focal motor/sensory deficits  LABORATORY DATA:  I have reviewed the data as listed    Component Value Date/Time   NA 141 08/12/2016 1117   K 4.2 08/12/2016 1117   CL 106 07/25/2016 0648   CO2 25 08/12/2016 1117   GLUCOSE 121 08/12/2016 1117   BUN 32.1 (H) 08/12/2016 1117   CREATININE 1.4 (H) 08/12/2016 1117   CALCIUM 10.4 08/12/2016 1117   PROT 7.5 08/12/2016 1117   ALBUMIN 2.4 (L) 08/12/2016 1117   AST 33 08/12/2016 1117   ALT 19 08/12/2016 1117   ALKPHOS 109 08/12/2016 1117   BILITOT 0.46 08/12/2016 1117   GFRNONAA 32 (L) 07/25/2016 0648   GFRAA 37 (L) 07/25/2016 0648    No results found for: SPEP, UPEP  Lab Results  Component Value Date   WBC 7.6 08/12/2016   NEUTROABS 5.2 08/12/2016   HGB 7.6 (L) 08/12/2016   HCT 24.1 (L) 08/12/2016   MCV 94.1 08/12/2016   PLT 151 08/12/2016      Chemistry      Component Value Date/Time   NA 141 08/12/2016 1117   K 4.2 08/12/2016 1117   CL 106 07/25/2016 0648   CO2 25 08/12/2016 1117   BUN 32.1 (H) 08/12/2016 1117   CREATININE 1.4 (H) 08/12/2016 1117      Component Value Date/Time   CALCIUM 10.4 08/12/2016 1117   ALKPHOS 109 08/12/2016 1117   AST 33 08/12/2016 1117   ALT 19 08/12/2016 1117   BILITOT 0.46 08/12/2016 1117       RADIOGRAPHIC STUDIES: I have reviewed imaging study with the patient and family members I have personally reviewed the radiological images as listed and agreed with the findings in the report. Dg Scapula Left  Result Date: 07/21/2016 CLINICAL DATA:  Pain after falling onto shoulder at noon yesterday. EXAM: LEFT SCAPULA - 2+ VIEWS COMPARISON:  None. FINDINGS: There is no evidence of fracture or other focal bone lesions. Soft tissues  are unremarkable. IMPRESSION: Negative. Electronically Signed   By: Andreas Newport M.D.   On: 07/21/2016 00:26   Mr Jeri Cos PY Contrast  Result Date: 07/21/2016 CLINICAL DATA:  History of ovarian cancer.  Nausea EXAM: MRI HEAD WITHOUT AND WITH CONTRAST TECHNIQUE: Multiplanar, multiecho pulse sequences of the brain and surrounding structures were obtained without and with intravenous contrast. CONTRAST:  54m MULTIHANCE GADOBENATE DIMEGLUMINE 529 MG/ML IV SOLN COMPARISON:  Head CT 02/17/2016 FINDINGS: Brain: The midline structures are normal. There is no focal diffusion restriction to indicate acute infarct. There is multifocal hyperintense T2-weighted signal within the periventricular and subcortical white matter, which may be seen in the setting of migraine headaches or early chronic microvascular disease; however, it is also seen in normal patients of this age. No intraparenchymal hematoma or chronic microhemorrhage. Brain volume is normal for age without age-advanced or lobar predominant atrophy. The dura is normal and there is no extra-axial collection. Vascular: Major intracranial arterial and venous sinus flow voids are preserved. Skull and upper cervical spine: The visualized skull base, calvarium, upper cervical spine and extracranial soft tissues are normal. Sinuses/Orbits: No fluid levels or advanced mucosal thickening. No mastoid effusion. Normal orbits. IMPRESSION: No intracranial or calvarial metastatic disease. Electronically Signed   By: KUlyses JarredM.D.   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 Progressive abdominal distention. Restaging. EXAM: CT ABDOMEN AND PELVIS WITH CONTRAST TECHNIQUE: Multidetector CT imaging of the abdomen and pelvis was performed using the standard protocol following bolus administration  of intravenous contrast. CONTRAST:  37m ISOVUE-300 IOPAMIDOL (ISOVUE-300) INJECTION 61% COMPARISON:  05/28/2016 CT abdomen/ pelvis. FINDINGS: Lower chest: Small to moderate dependent left pleural effusion, mildly decreased. Associated mild compressive atelectasis in the dependent left lower lobe. Tip of right internal jugular MediPort is seen in the right atrium. Mildly prominent 0.9 cm right pericardiophrenic node (series 2/image 16), previously 0.8 cm, not appreciably changed. Hepatobiliary: Normal liver size. No parenchymal liver mass. Cholecystectomy. No biliary ductal dilatation. Pancreas: Normal, with no mass or duct dilation. Spleen: Normal size. No mass. Adrenals/Urinary Tract: Normal adrenals. No hydronephrosis. No significant renal mass. Normal bladder. Stomach/Bowel: Grossly normal stomach. Normal caliber small bowel with no small bowel wall thickening. Normal appendix. There is an irregular serosal 5.6 x 3.2 cm mass at the rectosigmoid junction (series 2/image 78), previously 5.6 x 3.1 cm, not appreciably changed. No additional sites of large bowel wall thickening. Oral contrast reaches the rectum. Vascular/Lymphatic: Normal caliber abdominal aorta. Patent portal, splenic, hepatic and renal veins. Stable mildly enlarged 1.0 cm right common iliac node (series 2/ image 67). No additional pathologically enlarged abdominopelvic nodes. Reproductive: Hysterectomy.  No adnexal masses. Other: Large volume ascites is significantly increased. Ascitic fluid extends into the moderate periumbilical ventral abdominal hernia. Numerous bulky peritoneal implants are stable, with representative peritoneal masses as follows: - at the left posterior liver lobe margin, a 9.0 x 7.8 cm mass (series 2/image 27), previously 9.0 x 7.6 cm, stable - at the anterior right perihepatic space, a 4.6 x 2.4 cm mass (series 2/image 30), previously 5.0 x 2.3 cm, not appreciably changed - at the posterior right liver margin, a 9.2 x 6.3 cm  mass with significant new central necrosis (series 2/image 30), previously 8.4 x 6.9 cm, not appreciably changed in size - at the inferior left liver lobe margin, a 9.2 x 10.6 cm mass (series 2/image 38), previously 10.6 x 9.1 cm, stable - left lower quadrant 2.5 x 2.2 cm implant (series 2/image 71), previously 2.5 x 2.3 cm, stable - right lower quadrant 1.5 x 1.2 cm implant (series 2/image 71), previously 1.6 x 1.2 cm, stable Stable caking in the ventral left upper peritoneal fat. Subcutaneous nodularity throughout the bilateral ventral abdominal wall, not appreciably changed, presumably related to injection of medications. Musculoskeletal: No aggressive appearing focal osseous lesions. Moderate thoracolumbar spondylosis, most prominent at L5-S1. IMPRESSION: 1. Large volume malignant ascites, significantly increased . 2. Bulky peritoneal tumor implants are stable in number and size. 3. Small to moderate dependent left pleural effusion, mildly decreased . 4. Stable mild right pelvic adenopathy. 5. Moderate paraumbilical hernia containing ascitic fluid, increased. 6. No evidence of bowel obstruction. Stable bulky serosal tumor implant at the rectosigmoid junction . Electronically Signed   By: JIlona SorrelM.D.   On: 08/08/2016 21:34    ASSESSMENT & PLAN:  Ovarian cancer, bilateral ([place] Review of CT imaging results from tumor marker with the patient and family members She has evidence of cancer necrosis and with improvement of tumor marker, I felt that overall she is responding to treatment The patient is not a candidate for clinical trial due to severe anemia She will continue reduced dose  gemcitabine and aggressive supportive care during treatment  Malignant ascites She has severe, malignant ascites I recommend therapeutic paracentesis I recommend she hold Xarelto until after paracentesis.  CKD (chronic kidney disease) stage 3, GFR 30-59 ml/min With aggressive blood transfusion support, her kidney  function is improving Continue close monitoring of her kidney function.  Anemia due to antineoplastic chemotherapy The causes of anemia is multifactorial but most likely due to cancer chemotherapy side effects I recommend close monitoring of her blood work She would receive 2 units of blood transfusion whenever her hemoglobin is less than 8  Acute saddle pulmonary embolism with acute cor pulmonale (HCC) She had history of DVT She has been on anticoagulation therapy for over 6 months She is currently on long-term treatment to prevent recurrent DVT and PE I recommend holding off Xarelto until after paracentesis  Malignant cachexia [facility]) The patient is started on mirtazapine. She is not able to eat much due to early satiety form significant ascites I continue to encourage her frequent small meals  Pleural effusion on left She has pleural effusion Overall, she has adequate oxygenation I would defer left-sided thoracentesis until the next visit.  I recommend therapeutic paracentesis first.   Orders Placed This Encounter  Procedures  . US Paracentesis    Standing Status:   Future    Standing Expiration Date:   08/12/2017    Order Specific Question:   If therapeutic, is there a maximum amount of fluid to be removed?    Answer:   Yes    Comments:   5 liters    Order Specific Question:   What is the maximum amount of fluide to be removed?    Answer:   5 liters    Order Specific Question:   Are labs required for specimen collection?    Answer:   No    Order Specific Question:   Is Albumin medication needed?    Answer:   No    Order Specific Question:   Reason for Exam (SYMPTOM  OR DIAGNOSIS REQUIRED)    Answer:   malignant ascites    Order Specific Question:   Preferred imaging location?    Answer:   [facility]   All questions were answered. The patient knows to call the clinic with any problems, questions or concerns. No barriers to learning was detected. I spent 30  minutes counseling the patient face to face. The total time spent in the appointment was 40 minutes and more than 50% was on counseling and review of test results     Heath Lark, MD 08/13/2016 6:40 PM

## 2016-08-13 NOTE — Assessment & Plan Note (Signed)
She had history of DVT She has been on anticoagulation therapy for over 6 months She is currently on long-term treatment to prevent recurrent DVT and PE I recommend holding off Xarelto until after paracentesis

## 2016-08-13 NOTE — Assessment & Plan Note (Signed)
The causes of anemia is multifactorial but most likely due to cancer chemotherapy side effects I recommend close monitoring of her blood work She would receive 2 units of blood transfusion whenever her hemoglobin is less than 8

## 2016-08-13 NOTE — Assessment & Plan Note (Signed)
She has pleural effusion Overall, she has adequate oxygenation I would defer left-sided thoracentesis until the next visit.  I recommend therapeutic paracentesis first.

## 2016-08-13 NOTE — Assessment & Plan Note (Signed)
Review of CT imaging results from tumor marker with the patient and family members She has evidence of cancer necrosis and with improvement of tumor marker, I felt that overall she is responding to treatment The patient is not a candidate for clinical trial due to severe anemia She will continue reduced dose gemcitabine and aggressive supportive care during treatment

## 2016-08-15 ENCOUNTER — Ambulatory Visit: Payer: Managed Care, Other (non HMO)

## 2016-08-15 ENCOUNTER — Other Ambulatory Visit: Payer: Self-pay

## 2016-08-15 ENCOUNTER — Other Ambulatory Visit (HOSPITAL_BASED_OUTPATIENT_CLINIC_OR_DEPARTMENT_OTHER): Payer: Managed Care, Other (non HMO)

## 2016-08-15 ENCOUNTER — Ambulatory Visit (HOSPITAL_COMMUNITY)
Admission: RE | Admit: 2016-08-15 | Discharge: 2016-08-15 | Disposition: A | Discharge status: Home / Self Care | Payer: Managed Care, Other (non HMO) | Source: Ambulatory Visit | Attending: Hematology and Oncology | Admitting: Hematology and Oncology

## 2016-08-15 DIAGNOSIS — C563 Malignant neoplasm of bilateral ovaries: Secondary | ICD-10-CM

## 2016-08-15 DIAGNOSIS — C561 Malignant neoplasm of right ovary: Secondary | ICD-10-CM

## 2016-08-15 DIAGNOSIS — C562 Malignant neoplasm of left ovary: Principal | ICD-10-CM

## 2016-08-15 DIAGNOSIS — D6481 Anemia due to antineoplastic chemotherapy: Secondary | ICD-10-CM

## 2016-08-15 DIAGNOSIS — T451X5A Adverse effect of antineoplastic and immunosuppressive drugs, initial encounter: Secondary | ICD-10-CM

## 2016-08-15 DIAGNOSIS — C569 Malignant neoplasm of unspecified ovary: Secondary | ICD-10-CM

## 2016-08-15 DIAGNOSIS — R180 Malignant ascites: Secondary | ICD-10-CM | POA: Insufficient documentation

## 2016-08-15 LAB — COMPREHENSIVE METABOLIC PANEL
ALK PHOS: 114 U/L (ref 40–150)
ALT: 9 U/L (ref 0–55)
ANION GAP: 12 meq/L — AB (ref 3–11)
AST: 10 U/L (ref 5–34)
Albumin: 2.2 g/dL — ABNORMAL LOW (ref 3.5–5.0)
BILIRUBIN TOTAL: 0.56 mg/dL (ref 0.20–1.20)
BUN: 40.5 mg/dL — ABNORMAL HIGH (ref 7.0–26.0)
CALCIUM: 10.3 mg/dL (ref 8.4–10.4)
CO2: 24 mEq/L (ref 22–29)
Chloride: 105 mEq/L (ref 98–109)
Creatinine: 1.6 mg/dL — ABNORMAL HIGH (ref 0.6–1.1)
EGFR: 41 mL/min/{1.73_m2} — AB (ref >90)
Glucose: 160 mg/dl — ABNORMAL HIGH (ref 70–140)
Potassium: 4.7 mEq/L (ref 3.5–5.1)
Sodium: 141 mEq/L (ref 136–145)
TOTAL PROTEIN: 7.3 g/dL (ref 6.4–8.3)

## 2016-08-15 LAB — CBC WITH DIFFERENTIAL/PLATELET
BASO%: 0.4 % (ref 0.0–2.0)
Basophils Absolute: 0 10*3/uL (ref 0.0–0.1)
EOS%: 0.2 % (ref 0.0–7.0)
Eosinophils Absolute: 0 10*3/uL (ref 0.0–0.5)
HCT: 30.8 % — ABNORMAL LOW (ref 34.8–46.6)
HGB: 10.2 g/dL — ABNORMAL LOW (ref 11.6–15.9)
LYMPH%: 4 % — ABNORMAL LOW (ref 14.0–49.7)
MCH: 30.2 pg (ref 25.1–34.0)
MCHC: 33.1 g/dL (ref 31.5–36.0)
MCV: 91.4 fL (ref 79.5–101.0)
MONO#: 0 10*3/uL — ABNORMAL LOW (ref 0.1–0.9)
MONO%: 0.3 % (ref 0.0–14.0)
NEUT#: 9.9 10*3/uL — ABNORMAL HIGH (ref 1.5–6.5)
NEUT%: 95.1 % — ABNORMAL HIGH (ref 38.4–76.8)
Platelets: 133 10*3/uL — ABNORMAL LOW (ref 145–400)
RBC: 3.37 10*6/uL — ABNORMAL LOW (ref 3.70–5.45)
RDW: 15.8 % — ABNORMAL HIGH (ref 11.2–14.5)
WBC: 10.4 10*3/uL — ABNORMAL HIGH (ref 3.9–10.3)
lymph#: 0.4 10*3/uL — ABNORMAL LOW (ref 0.9–3.3)

## 2016-08-15 MED ORDER — PROCHLORPERAZINE MALEATE 10 MG PO TABS: 10.0000 mg | ORAL_TABLET | Freq: Once | ORAL | Status: AC

## 2016-08-15 MED ORDER — SODIUM CHLORIDE 0.9 % IV SOLN: Freq: Once | INTRAVENOUS | Status: AC

## 2016-08-15 MED ORDER — SODIUM CHLORIDE 0.9% FLUSH
10.0000 mL | INTRAVENOUS | Status: AC | PRN
  Filled 2016-08-15: qty 10

## 2016-08-15 MED ORDER — HEPARIN SOD (PORK) LOCK FLUSH 100 UNIT/ML IV SOLN
500.0000 [IU] | Freq: Once | INTRAVENOUS | Status: AC | PRN
  Filled 2016-08-15: qty 5

## 2016-08-15 NOTE — Progress Notes (Unsigned)
No blood transfusion today w Hgb 10-pt feels much better after thoracentesis.

## 2016-08-15 NOTE — Procedures (Signed)
Ultrasound-guided therapeutic paracentesis performed yielding 5 liters of bloody colored fluid. No immediate complications.  Neoma Uhrich E 11:04 AM 08/15/2016

## 2016-08-19 ENCOUNTER — Telehealth: Payer: Self-pay | Admitting: *Deleted

## 2016-08-19 ENCOUNTER — Ambulatory Visit: Payer: Self-pay

## 2016-08-19 ENCOUNTER — Ambulatory Visit: Payer: Self-pay | Admitting: Hematology and Oncology

## 2016-08-19 ENCOUNTER — Telehealth: Payer: Self-pay | Admitting: Hematology and Oncology

## 2016-08-19 ENCOUNTER — Other Ambulatory Visit: Payer: Self-pay

## 2016-08-19 NOTE — Telephone Encounter (Signed)
Her appt has already been cancelled Nobody has called me yet I would defer to physicians at [facility] for management

## 2016-08-19 NOTE — Telephone Encounter (Signed)
A RN from [place] called to let us know that the patient is in the ER that and will not be making her appointment

## 2016-08-19 NOTE — Telephone Encounter (Signed)
Hi Audrey Porter,  I won't be here on Friday Would you mind call her/husband on Thursday to check if she was being admitted to see if we need to cancel Friday appt?

## 2016-08-19 NOTE — Telephone Encounter (Signed)
Spoke with patient. She is waiting for lab results. Is in ED in [place]. Will call us Wednesday and let us know what is going on.

## 2016-08-19 NOTE — Telephone Encounter (Signed)
"  I will not be in today to see Dr. Alvy Bimler.  I'm in the Emergency room of [facility] in [place], [place].  Having trouble getting dressed this morning.  My husband had to dress me.  I passed out.  He called 911, I was brought to the closest ED.  I pass ouit easily, especially when my blood pressure gets low.  Hopefully the doctors will call her."

## 2016-08-20 ENCOUNTER — Telehealth: Payer: Self-pay

## 2016-08-20 NOTE — Telephone Encounter (Signed)
lvm with phone receptionist per Dr Alvy Bimler response.

## 2016-08-20 NOTE — Telephone Encounter (Signed)
Audrey Porter with [employer] called about pt's short term disability claim. She has pt completing chemo through July. She is asking if there is a return to work date.  This RN notes care plan for gemzar through 9/22.  Claiborne Billings phone [phone]

## 2016-08-20 NOTE — Telephone Encounter (Signed)
Patient to never return back to work. She has incurable cancer and has recurrent admission to the hospital

## 2016-08-21 ENCOUNTER — Telehealth: Payer: Self-pay | Admitting: *Deleted

## 2016-08-21 NOTE — Telephone Encounter (Signed)
Spoke with daughter. States Mrs Carreon died last night at [facility]

## 2016-08-22 ENCOUNTER — Ambulatory Visit: Payer: Self-pay

## 2016-08-22 ENCOUNTER — Other Ambulatory Visit: Payer: Self-pay

## 2016-08-26 ENCOUNTER — Other Ambulatory Visit: Payer: Self-pay

## 2016-08-26 ENCOUNTER — Other Ambulatory Visit: Payer: Self-pay | Admitting: *Deleted

## 2016-08-26 ENCOUNTER — Other Ambulatory Visit: Payer: Self-pay | Admitting: Hematology and Oncology

## 2016-08-26 ENCOUNTER — Ambulatory Visit: Payer: Self-pay

## 2016-08-26 NOTE — Patient Outreach (Addendum)
[name]  08/26/2016  Audrey Porter [birth year] [id number]   Referral Date:  08/22/16   Per chart review, patient expired on 08/17/2016, will continue with case closure due to patient being deceased.  RNCM will send case closure request due to patient being ineligible for services, patient deceased, to [facility].   Ladene Allocca H. Annia Friendly, BSN, [facility] Telephonic CM Phone: [phone] Fax: [phone]

## 2016-08-27 DEATH — deceased

## 2016-08-29 ENCOUNTER — Ambulatory Visit: Payer: Self-pay

## 2016-08-29 ENCOUNTER — Other Ambulatory Visit: Payer: Self-pay

## 2016-09-02 ENCOUNTER — Other Ambulatory Visit: Payer: Self-pay

## 2016-09-02 ENCOUNTER — Ambulatory Visit: Payer: Self-pay

## 2016-09-05 ENCOUNTER — Other Ambulatory Visit: Payer: Self-pay

## 2016-09-05 ENCOUNTER — Ambulatory Visit: Payer: Self-pay | Admitting: Hematology and Oncology

## 2016-09-05 ENCOUNTER — Ambulatory Visit: Payer: Self-pay

## 2016-09-10 ENCOUNTER — Other Ambulatory Visit: Payer: Self-pay | Admitting: Nurse Practitioner

## 2016-09-20 ENCOUNTER — Other Ambulatory Visit: Payer: Self-pay | Admitting: Hematology and Oncology

## 2017-06-12 IMAGING — CT CT ABD-PELV W/ CM
2 of 5 series · 15 of 46 positions shown, 17 images · IV contrast (ISOVUE 300)
Comparison: None.

CLINICAL DATA: History of ovarian cancer.  Restaging.

EXAM:
CT ABDOMEN AND PELVIS WITH CONTRAST
TECHNIQUE: Multidetector CT imaging of the abdomen and pelvis was performed
using the standard protocol following bolus administration of
intravenous contrast.
CONTRAST:  100mL EXV1H3-VJJ IOPAMIDOL (EXV1H3-VJJ) INJECTION 61%

[Series 2: axial st · axial · 0.82mm/px · z∈[-427,-37]mm · 12 of 92 slices shown, 14 images]
[im 7/92  soft-tissue]
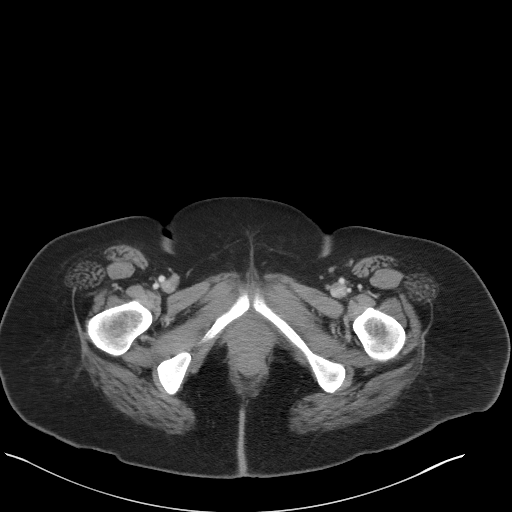
[im 7/92  bone]
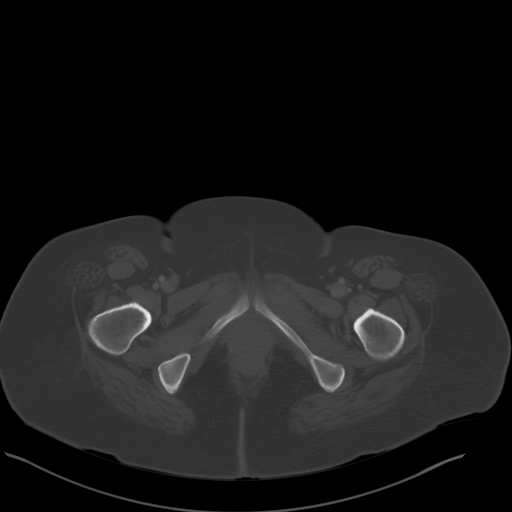
[im 13/92  soft-tissue]
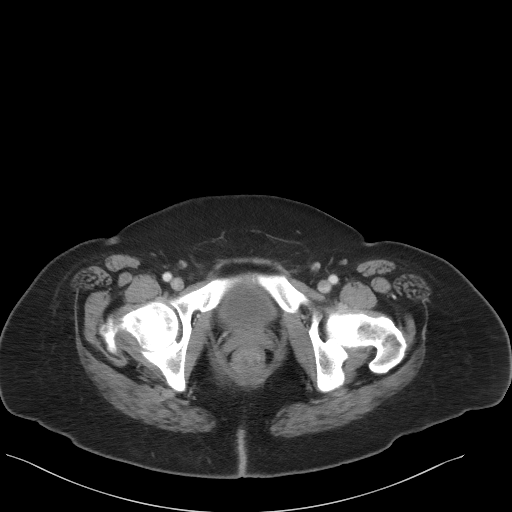
[im 19/92  soft-tissue]
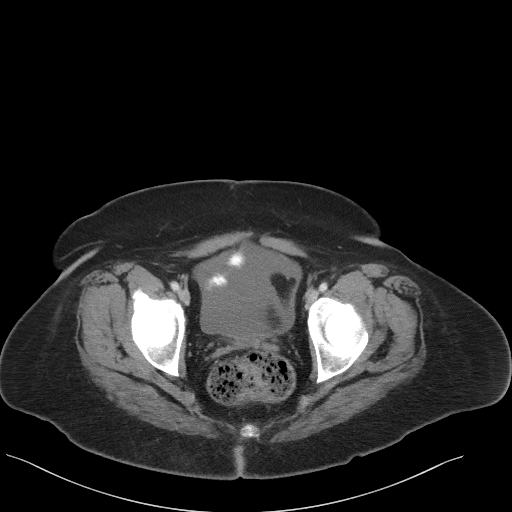
[im 31/92  soft-tissue]
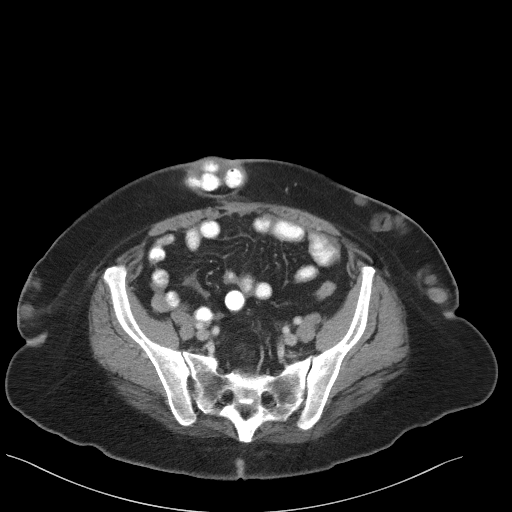
[im 37/92  soft-tissue]
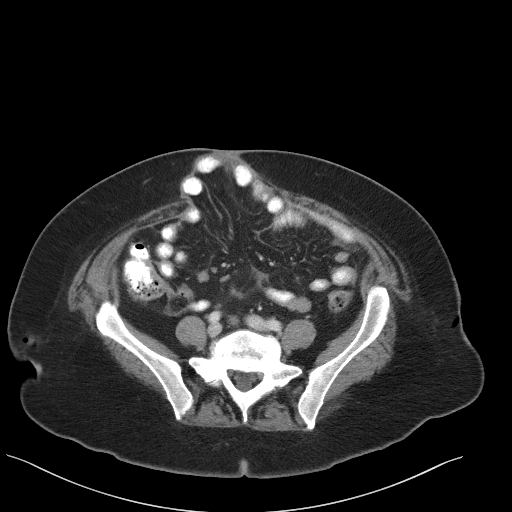
[im 43/92  soft-tissue]
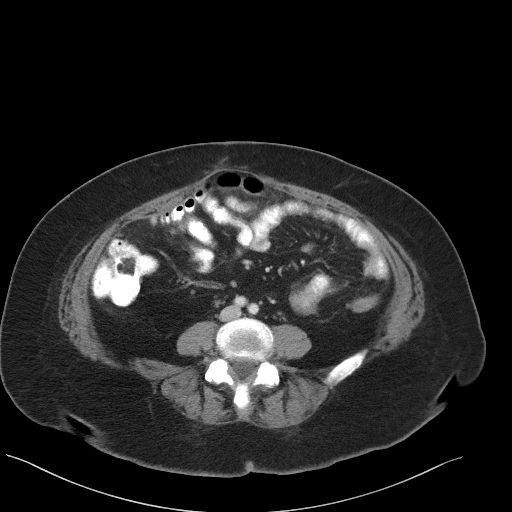
[im 49/92  soft-tissue]
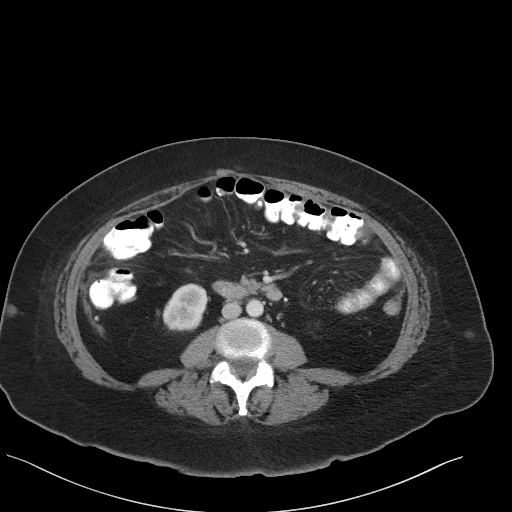
[im 55/92  soft-tissue]
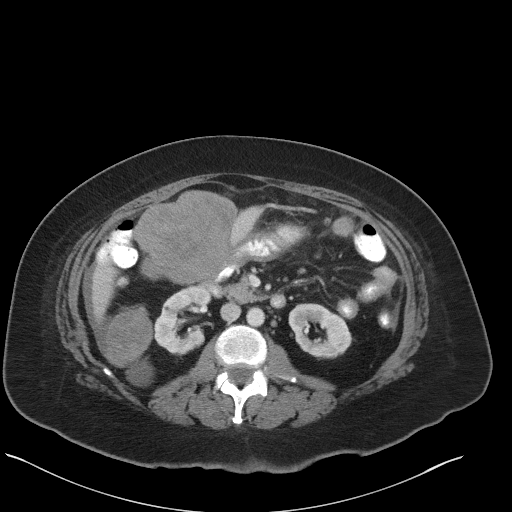
[im 61/92  soft-tissue]
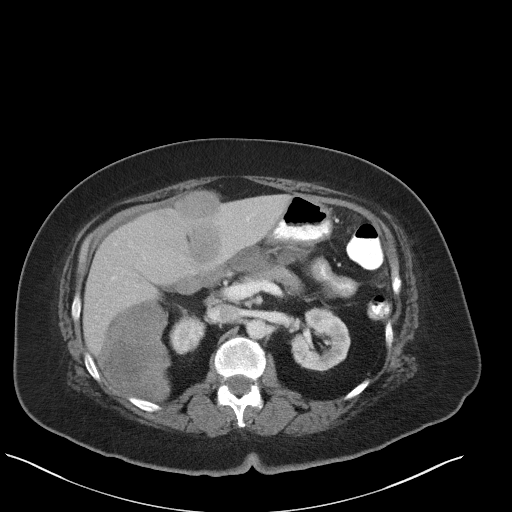
[im 61/92  bone]
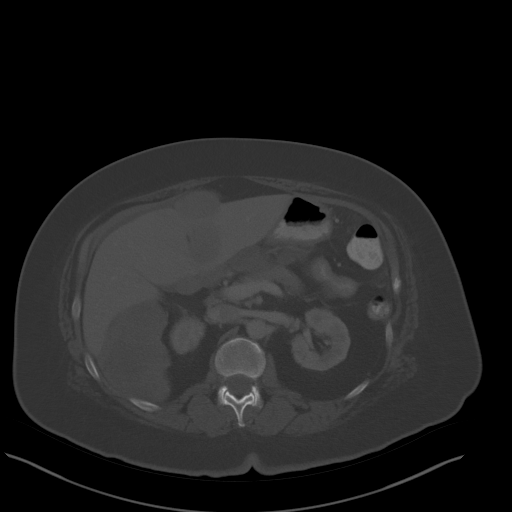
[im 73/92  soft-tissue]
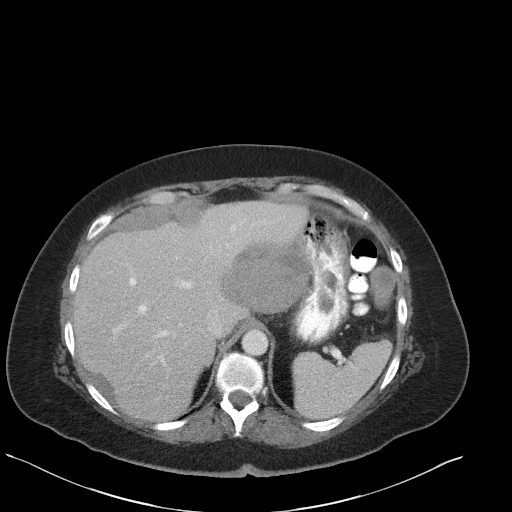
[im 79/92  soft-tissue]
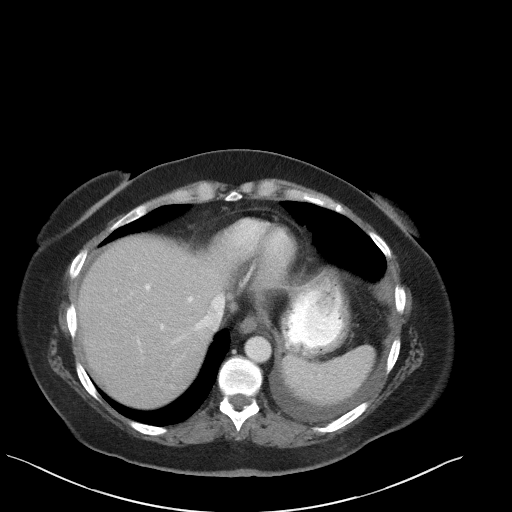
[im 85/92  soft-tissue]
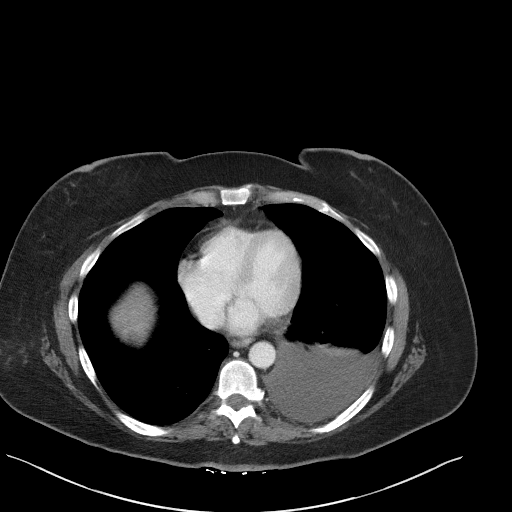

[Series 4: coronal st · coronal · 0.75mm/px · 3 of 96 slices shown]
[im 32/96  soft-tissue]
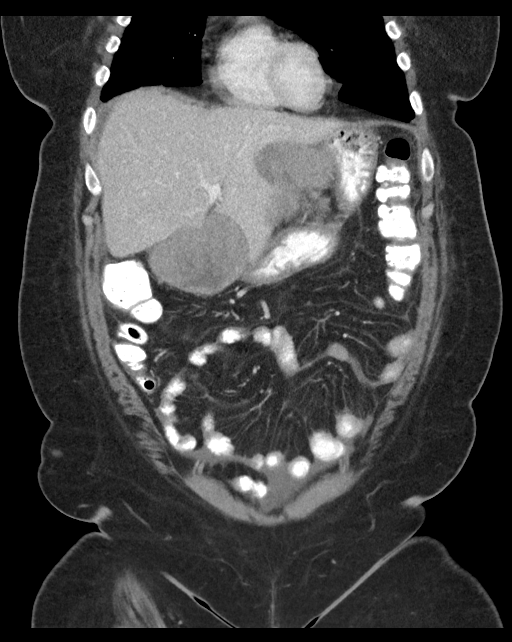
[im 43/96  soft-tissue]
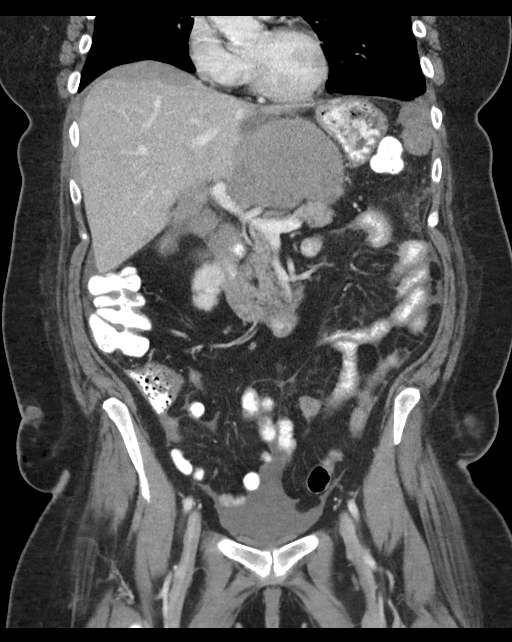
[im 53/96  soft-tissue]
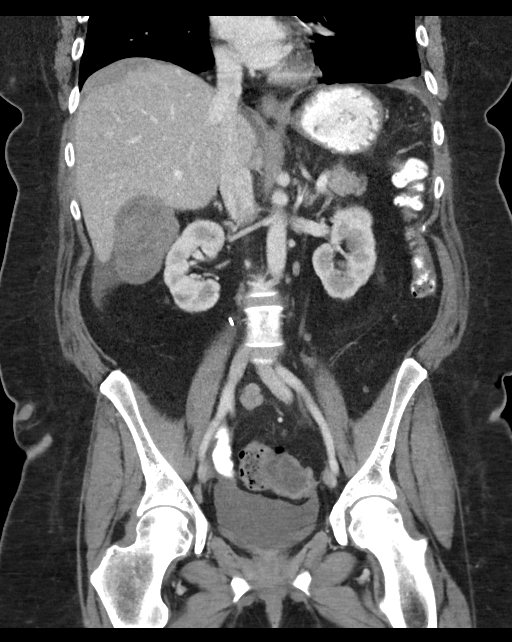

[15 of 46 positions shown; findings below may reference images not displayed]

FINDINGS: Lower chest: There is a moderate left pleural effusion identified
this is increased in volume from previous exam.

Hepatobiliary: No focal parenchymal liver abnormality. There are
multiple masses study minutes surface of the liver.

Pancreas: Unremarkable. No pancreatic ductal dilatation or
surrounding inflammatory changes.

Spleen: Normal in size without focal abnormality.

Adrenals/Urinary Tract: Normal adrenal glands. There is mild
bilateral renal cortical scarring. No mass or hydronephrosis. The
urinary bladder is normal.

Stomach/Bowel: The stomach appears normal. The small bowel loops are
nondilated. No pathologic dilatation of the colon.

Vascular/Lymphatic: Normal appearance of the abdominal aorta. No
retroperitoneal adenopathy. No pelvic or inguinal adenopathy.

Reproductive: Previous hysterectomy.

Other: Multiple peritoneal masses are again noted within the abdomen
and pelvis. The index lesion along the undersurface of the left lobe
of liver measures 8.6 by 5.3 cm, image 26 of series 2. Previously
7.9 x 5.0 cm. Mass along the undersurface of the right lobe measures
8.4 by 6.3 cm, image 29 of series 2. Previously 8.8 x 7.4 cm. Right
upper quadrant mass which extends into the falciform ligament
measures 8.5 x 8.0 cm, image 36 of series 2. Left upper quadrant
index nodule measures 2.5 cm, image 37 of series 2. Previously
cm. Ascites is identified within the abdomen and pelvis. The volume
of ascites appears decreased compared with previous exam. The index
lesion within the central pelvis measures 4.6 x 2.9 cm, image 70 of
series 2. Previously 4.4 x 2.8 cm.

Ventral abdominal wall hernia is again identified which contains a
nonobstructed loop of small bowel.

Musculoskeletal: No aggressive lytic or sclerotic bone lesions.
Degenerative disc disease is identified at L4-5.
IMPRESSION: 1. No acute findings within the abdomen or pelvis.
2. Large volume of peritoneal tumor is identified within the abdomen
and pelvis. This is stable to slightly increased in size in the
interval.
3. Decrease in volume of ascites
4. Left pleural effusion is slightly increased in volume from
previous exam.
